# Patient Record
Sex: Male | Born: 1935 | Race: White | Hispanic: No | Marital: Married | Smoking: Former smoker
Health system: Southern US, Community
[De-identification: ages and names within clinical notes are randomized; demographics above are authoritative.]

## PROBLEM LIST (undated history)

## (undated) DIAGNOSIS — N289 Disorder of kidney and ureter, unspecified: Secondary | ICD-10-CM

## (undated) DIAGNOSIS — I10 Essential (primary) hypertension: Secondary | ICD-10-CM

## (undated) DIAGNOSIS — K56609 Unspecified intestinal obstruction, unspecified as to partial versus complete obstruction: Secondary | ICD-10-CM

## (undated) DIAGNOSIS — I350 Nonrheumatic aortic (valve) stenosis: Secondary | ICD-10-CM

## (undated) DIAGNOSIS — Z9981 Dependence on supplemental oxygen: Secondary | ICD-10-CM

## (undated) DIAGNOSIS — J189 Pneumonia, unspecified organism: Secondary | ICD-10-CM

## (undated) DIAGNOSIS — J449 Chronic obstructive pulmonary disease, unspecified: Secondary | ICD-10-CM

## (undated) DIAGNOSIS — J948 Other specified pleural conditions: Secondary | ICD-10-CM

## (undated) DIAGNOSIS — I4891 Unspecified atrial fibrillation: Secondary | ICD-10-CM

## (undated) DIAGNOSIS — I509 Heart failure, unspecified: Secondary | ICD-10-CM

## (undated) DIAGNOSIS — M1990 Unspecified osteoarthritis, unspecified site: Secondary | ICD-10-CM

## (undated) HISTORY — PX: ROTATOR CUFF REPAIR: SHX139

## (undated) HISTORY — PX: CORONARY ANGIOPLASTY: SHX604

## (undated) HISTORY — DX: Unspecified intestinal obstruction, unspecified as to partial versus complete obstruction: K56.609

## (undated) HISTORY — DX: Pneumonia, unspecified organism: J18.9

## (undated) HISTORY — DX: Other specified pleural conditions: J94.8

## (undated) HISTORY — PX: CHOLECYSTECTOMY: SHX55

## (undated) HISTORY — DX: Unspecified atrial fibrillation: I48.91

## (undated) HISTORY — DX: Disorder of kidney and ureter, unspecified: N28.9

## (undated) HISTORY — PX: GALLBLADDER SURGERY: SHX652

## (undated) HISTORY — DX: Essential (primary) hypertension: I10

## (undated) HISTORY — DX: Nonrheumatic aortic (valve) stenosis: I35.0

## (undated) HISTORY — DX: Unspecified osteoarthritis, unspecified site: M19.90

## (undated) HISTORY — PX: OTHER SURGICAL HISTORY: SHX169

## (undated) HISTORY — PX: CARDIAC VALVE REPLACEMENT: SHX585

## (undated) HISTORY — DX: Chronic obstructive pulmonary disease, unspecified: J44.9

---

## 2005-01-08 ENCOUNTER — Ambulatory Visit (HOSPITAL_COMMUNITY): Admission: RE | Admit: 2005-01-08 | Discharge: 2005-01-08 | Payer: Self-pay | Admitting: Pulmonary Disease

## 2005-01-18 ENCOUNTER — Other Ambulatory Visit: Admission: RE | Admit: 2005-01-18 | Discharge: 2005-01-18 | Payer: Self-pay | Admitting: Dermatology

## 2005-07-03 ENCOUNTER — Inpatient Hospital Stay (HOSPITAL_COMMUNITY): Admission: EM | Admit: 2005-07-03 | Discharge: 2005-07-06 | Payer: Self-pay | Admitting: Emergency Medicine

## 2005-07-26 ENCOUNTER — Ambulatory Visit (HOSPITAL_COMMUNITY): Admission: RE | Admit: 2005-07-26 | Discharge: 2005-07-26 | Payer: Self-pay | Admitting: Pulmonary Disease

## 2006-10-10 ENCOUNTER — Ambulatory Visit (HOSPITAL_COMMUNITY): Admission: RE | Admit: 2006-10-10 | Discharge: 2006-10-10 | Payer: Self-pay | Admitting: Pulmonary Disease

## 2008-05-17 ENCOUNTER — Ambulatory Visit (HOSPITAL_COMMUNITY): Admission: RE | Admit: 2008-05-17 | Discharge: 2008-05-17 | Payer: Self-pay | Admitting: Pulmonary Disease

## 2008-05-26 ENCOUNTER — Ambulatory Visit (HOSPITAL_COMMUNITY): Admission: RE | Admit: 2008-05-26 | Discharge: 2008-05-26 | Payer: Self-pay | Admitting: Pulmonary Disease

## 2008-06-02 ENCOUNTER — Ambulatory Visit: Payer: Self-pay | Admitting: Cardiology

## 2008-06-02 ENCOUNTER — Encounter (INDEPENDENT_AMBULATORY_CARE_PROVIDER_SITE_OTHER): Payer: Self-pay | Admitting: Pulmonary Disease

## 2008-06-02 ENCOUNTER — Ambulatory Visit (HOSPITAL_COMMUNITY): Admission: RE | Admit: 2008-06-02 | Discharge: 2008-06-02 | Payer: Self-pay | Admitting: Pulmonary Disease

## 2008-07-12 ENCOUNTER — Ambulatory Visit (HOSPITAL_COMMUNITY): Admission: RE | Admit: 2008-07-12 | Discharge: 2008-07-12 | Payer: Self-pay | Admitting: Pulmonary Disease

## 2008-07-15 ENCOUNTER — Ambulatory Visit (HOSPITAL_COMMUNITY): Admission: RE | Admit: 2008-07-15 | Discharge: 2008-07-15 | Payer: Self-pay | Admitting: Pulmonary Disease

## 2008-08-30 ENCOUNTER — Emergency Department (HOSPITAL_COMMUNITY): Admission: EM | Admit: 2008-08-30 | Discharge: 2008-08-31 | Payer: Self-pay | Admitting: Emergency Medicine

## 2008-09-17 ENCOUNTER — Ambulatory Visit (HOSPITAL_COMMUNITY): Admission: RE | Admit: 2008-09-17 | Discharge: 2008-09-17 | Payer: Self-pay | Admitting: Pulmonary Disease

## 2008-09-24 ENCOUNTER — Ambulatory Visit (HOSPITAL_COMMUNITY): Admission: RE | Admit: 2008-09-24 | Discharge: 2008-09-24 | Payer: Self-pay | Admitting: Pulmonary Disease

## 2009-01-24 ENCOUNTER — Ambulatory Visit (HOSPITAL_COMMUNITY): Admission: RE | Admit: 2009-01-24 | Discharge: 2009-01-24 | Payer: Self-pay | Admitting: Orthopedic Surgery

## 2009-01-26 HISTORY — PX: CARDIOVASCULAR STRESS TEST: SHX262

## 2009-02-02 ENCOUNTER — Ambulatory Visit (HOSPITAL_COMMUNITY): Admission: RE | Admit: 2009-02-02 | Discharge: 2009-02-03 | Payer: Self-pay | Admitting: Orthopedic Surgery

## 2009-02-27 ENCOUNTER — Inpatient Hospital Stay (HOSPITAL_COMMUNITY): Admission: EM | Admit: 2009-02-27 | Discharge: 2009-03-07 | Payer: Self-pay | Admitting: Emergency Medicine

## 2009-03-21 ENCOUNTER — Ambulatory Visit (HOSPITAL_COMMUNITY): Admission: RE | Admit: 2009-03-21 | Discharge: 2009-03-21 | Payer: Self-pay | Admitting: Pulmonary Disease

## 2009-03-23 ENCOUNTER — Encounter (HOSPITAL_COMMUNITY): Admission: RE | Admit: 2009-03-23 | Discharge: 2009-04-22 | Payer: Self-pay | Admitting: *Deleted

## 2009-04-25 ENCOUNTER — Ambulatory Visit (HOSPITAL_COMMUNITY): Admission: RE | Admit: 2009-04-25 | Discharge: 2009-04-25 | Payer: Self-pay | Admitting: Pulmonary Disease

## 2009-04-26 ENCOUNTER — Encounter (HOSPITAL_COMMUNITY): Admission: RE | Admit: 2009-04-26 | Discharge: 2009-05-26 | Payer: Self-pay | Admitting: Orthopedic Surgery

## 2009-06-17 ENCOUNTER — Ambulatory Visit (HOSPITAL_COMMUNITY): Admission: RE | Admit: 2009-06-17 | Discharge: 2009-06-17 | Payer: Self-pay | Admitting: Pulmonary Disease

## 2009-07-13 ENCOUNTER — Ambulatory Visit (HOSPITAL_COMMUNITY): Admission: RE | Admit: 2009-07-13 | Discharge: 2009-07-14 | Payer: Self-pay | Admitting: Orthopedic Surgery

## 2009-09-06 ENCOUNTER — Encounter (HOSPITAL_COMMUNITY): Admission: RE | Admit: 2009-09-06 | Discharge: 2009-09-29 | Payer: Self-pay | Admitting: Orthopedic Surgery

## 2009-10-04 ENCOUNTER — Encounter (HOSPITAL_COMMUNITY): Admission: RE | Admit: 2009-10-04 | Discharge: 2009-11-03 | Payer: Self-pay | Admitting: Orthopedic Surgery

## 2009-11-04 ENCOUNTER — Encounter (HOSPITAL_COMMUNITY): Admission: RE | Admit: 2009-11-04 | Discharge: 2009-12-04 | Payer: Self-pay | Admitting: Orthopedic Surgery

## 2009-12-26 ENCOUNTER — Ambulatory Visit (HOSPITAL_COMMUNITY): Admission: RE | Admit: 2009-12-26 | Discharge: 2009-12-26 | Payer: Self-pay | Admitting: Pulmonary Disease

## 2010-05-17 ENCOUNTER — Ambulatory Visit: Payer: Self-pay | Admitting: Cardiology

## 2010-05-17 DIAGNOSIS — I359 Nonrheumatic aortic valve disorder, unspecified: Secondary | ICD-10-CM | POA: Insufficient documentation

## 2010-05-17 DIAGNOSIS — I4892 Unspecified atrial flutter: Secondary | ICD-10-CM | POA: Insufficient documentation

## 2010-05-17 DIAGNOSIS — R0989 Other specified symptoms and signs involving the circulatory and respiratory systems: Secondary | ICD-10-CM

## 2010-05-18 ENCOUNTER — Encounter: Payer: Self-pay | Admitting: Cardiology

## 2010-05-22 ENCOUNTER — Encounter: Payer: Self-pay | Admitting: Cardiology

## 2010-05-22 ENCOUNTER — Ambulatory Visit (HOSPITAL_COMMUNITY): Admission: RE | Admit: 2010-05-22 | Discharge: 2010-05-22 | Payer: Self-pay | Admitting: Cardiology

## 2010-05-23 ENCOUNTER — Encounter: Payer: Self-pay | Admitting: Cardiology

## 2010-06-27 ENCOUNTER — Ambulatory Visit (HOSPITAL_COMMUNITY): Admission: RE | Admit: 2010-06-27 | Discharge: 2010-06-27 | Payer: Self-pay | Admitting: Pulmonary Disease

## 2010-08-17 ENCOUNTER — Ambulatory Visit: Payer: Self-pay | Admitting: Cardiology

## 2010-08-21 ENCOUNTER — Ambulatory Visit: Payer: Self-pay | Admitting: Cardiology

## 2010-08-22 ENCOUNTER — Ambulatory Visit: Payer: Self-pay | Admitting: Cardiology

## 2010-08-22 ENCOUNTER — Inpatient Hospital Stay (HOSPITAL_BASED_OUTPATIENT_CLINIC_OR_DEPARTMENT_OTHER): Admission: RE | Admit: 2010-08-22 | Discharge: 2010-08-22 | Payer: Self-pay | Admitting: Cardiology

## 2010-08-24 ENCOUNTER — Ambulatory Visit: Payer: Self-pay | Admitting: Cardiothoracic Surgery

## 2010-08-25 ENCOUNTER — Encounter: Payer: Self-pay | Admitting: Cardiothoracic Surgery

## 2010-08-25 ENCOUNTER — Telehealth (INDEPENDENT_AMBULATORY_CARE_PROVIDER_SITE_OTHER): Payer: Self-pay | Admitting: *Deleted

## 2010-08-25 ENCOUNTER — Encounter: Payer: Self-pay | Admitting: Pulmonary Disease

## 2010-08-28 ENCOUNTER — Ambulatory Visit: Payer: Self-pay | Admitting: Pulmonary Disease

## 2010-08-29 ENCOUNTER — Ambulatory Visit: Payer: Self-pay | Admitting: Cardiothoracic Surgery

## 2010-08-29 ENCOUNTER — Inpatient Hospital Stay (HOSPITAL_COMMUNITY): Admission: RE | Admit: 2010-08-29 | Discharge: 2010-09-20 | Payer: Self-pay | Admitting: Cardiothoracic Surgery

## 2010-08-29 ENCOUNTER — Ambulatory Visit: Payer: Self-pay | Admitting: Cardiology

## 2010-08-29 ENCOUNTER — Encounter: Payer: Self-pay | Admitting: Cardiothoracic Surgery

## 2010-08-29 HISTORY — PX: STERNAL INCISION RECLOSURE: SHX2442

## 2010-09-01 ENCOUNTER — Encounter: Payer: Self-pay | Admitting: Pulmonary Disease

## 2010-09-20 ENCOUNTER — Inpatient Hospital Stay: Admission: AD | Admit: 2010-09-20 | Discharge: 2010-09-22 | Payer: Self-pay | Admitting: Pulmonary Disease

## 2010-09-22 ENCOUNTER — Inpatient Hospital Stay (HOSPITAL_COMMUNITY): Admission: EM | Admit: 2010-09-22 | Discharge: 2010-09-28 | Payer: Self-pay | Admitting: Emergency Medicine

## 2010-09-28 ENCOUNTER — Inpatient Hospital Stay: Admission: AD | Admit: 2010-09-28 | Discharge: 2010-10-06 | Payer: Self-pay | Admitting: Pulmonary Disease

## 2010-10-20 ENCOUNTER — Ambulatory Visit: Payer: Self-pay | Admitting: Cardiology

## 2010-10-30 ENCOUNTER — Encounter (HOSPITAL_COMMUNITY)
Admission: RE | Admit: 2010-10-30 | Discharge: 2010-11-29 | Payer: Self-pay | Source: Home / Self Care | Admitting: Cardiology

## 2010-11-02 ENCOUNTER — Encounter: Admission: RE | Admit: 2010-11-02 | Discharge: 2010-11-02 | Payer: Self-pay | Admitting: Cardiothoracic Surgery

## 2010-11-02 ENCOUNTER — Ambulatory Visit: Payer: Self-pay | Admitting: Cardiothoracic Surgery

## 2010-11-17 ENCOUNTER — Ambulatory Visit (HOSPITAL_COMMUNITY): Admission: RE | Admit: 2010-11-17 | Discharge: 2010-11-17 | Payer: Self-pay | Admitting: Pulmonary Disease

## 2010-11-29 ENCOUNTER — Encounter (HOSPITAL_COMMUNITY)
Admission: RE | Admit: 2010-11-29 | Discharge: 2010-12-29 | Payer: Self-pay | Source: Home / Self Care | Attending: Cardiology | Admitting: Cardiology

## 2010-12-26 ENCOUNTER — Ambulatory Visit: Payer: Self-pay | Admitting: Cardiology

## 2010-12-29 ENCOUNTER — Encounter (HOSPITAL_COMMUNITY)
Admission: RE | Admit: 2010-12-29 | Discharge: 2010-12-30 | Payer: Self-pay | Source: Home / Self Care | Attending: Cardiology | Admitting: Cardiology

## 2010-12-31 DIAGNOSIS — J948 Other specified pleural conditions: Secondary | ICD-10-CM

## 2010-12-31 HISTORY — DX: Other specified pleural conditions: J94.8

## 2011-01-03 ENCOUNTER — Ambulatory Visit (HOSPITAL_COMMUNITY)
Admission: RE | Admit: 2011-01-03 | Discharge: 2011-01-03 | Payer: Self-pay | Source: Home / Self Care | Attending: Cardiology | Admitting: Cardiology

## 2011-01-03 ENCOUNTER — Ambulatory Visit: Admission: RE | Admit: 2011-01-03 | Discharge: 2011-01-03 | Payer: Self-pay | Source: Home / Self Care

## 2011-01-03 ENCOUNTER — Other Ambulatory Visit: Payer: Self-pay | Admitting: Cardiology

## 2011-01-03 ENCOUNTER — Encounter: Payer: Self-pay | Admitting: Cardiology

## 2011-01-03 ENCOUNTER — Encounter (HOSPITAL_COMMUNITY)
Admission: RE | Admit: 2011-01-03 | Discharge: 2011-01-30 | Payer: Self-pay | Source: Home / Self Care | Attending: Cardiology | Admitting: Cardiology

## 2011-01-03 HISTORY — PX: US ECHOCARDIOGRAPHY: HXRAD669

## 2011-01-18 ENCOUNTER — Encounter: Payer: Self-pay | Admitting: Cardiology

## 2011-01-18 DIAGNOSIS — J449 Chronic obstructive pulmonary disease, unspecified: Secondary | ICD-10-CM | POA: Insufficient documentation

## 2011-01-18 DIAGNOSIS — M1990 Unspecified osteoarthritis, unspecified site: Secondary | ICD-10-CM | POA: Insufficient documentation

## 2011-01-18 DIAGNOSIS — I10 Essential (primary) hypertension: Secondary | ICD-10-CM | POA: Insufficient documentation

## 2011-01-18 DIAGNOSIS — M109 Gout, unspecified: Secondary | ICD-10-CM | POA: Insufficient documentation

## 2011-01-18 DIAGNOSIS — K56609 Unspecified intestinal obstruction, unspecified as to partial versus complete obstruction: Secondary | ICD-10-CM | POA: Insufficient documentation

## 2011-01-18 DIAGNOSIS — J189 Pneumonia, unspecified organism: Secondary | ICD-10-CM | POA: Insufficient documentation

## 2011-01-30 NOTE — Letter (Signed)
Summary: NUCLEAR REPORT 01-26-09  NUCLEAR REPORT 01-26-09   Imported By: Faythe Ghee 05/18/2010 09:44:27  _____________________________________________________________________  External Attachment:    Type:   Image     Comment:   External Document

## 2011-01-30 NOTE — Letter (Signed)
Summary: PROGRESS NOTE 03-14-10  PROGRESS NOTE 03-14-10   Imported By: Faythe Ghee 05/18/2010 09:43:55  _____________________________________________________________________  External Attachment:    Type:   Image     Comment:   External Document

## 2011-01-30 NOTE — Miscellaneous (Signed)
Summary: Pulmonary function test   Pulmonary Function Test Date: 08/25/2010 Height (in.): 66 Gender: Male  Pre-Spirometry FVC    Value: 2.44 L/min   Pred: 3.69 L/min     % Pred: 66 % FEV1    Value: 1.03 L     Pred: 2.67 L     % Pred: 38 % FEV1/FVC  Value: 42 %     Pred: 73 %     % Pred: 58 % FEF 25-75  Value: 0.32 L/min   Pred: 2.00 L/min     % Pred: 16 %  Lung Volumes DLCO    Value: 16.34 %   % Pred: 60 % DLCO/VA  Value: 3.96 %   % Pred: 91 %  Comments: Severe obstruction.  Moderated diffusion defect. Clinical Lists Changes  Observations: Added new observation of PFT COMMENTS: Severe obstruction.  Moderated diffusion defect. (08/25/2010 14:41) Added new observation of DLCO/VA%EXP: 91 % (08/25/2010 14:41) Added new observation of DLCO/VA: 3.96 % (08/25/2010 14:41) Added new observation of DLCO % EXPEC: 60 % (08/25/2010 14:41) Added new observation of DLCO: 16.34 % (08/25/2010 14:41) Added new observation of FEF % EXPEC: 16 % (08/25/2010 14:41) Added new observation of FEF25-75%PRE: 2.00 L/min (08/25/2010 14:41) Added new observation of FEF 25-75%: 0.32 L/min (08/25/2010 14:41) Added new observation of FEV1/FVC%EXP: 58 % (08/25/2010 14:41) Added new observation of FEV1/FVC PRE: 73 % (08/25/2010 14:41) Added new observation of FEV1/FVC: 42 % (08/25/2010 14:41) Added new observation of FEV1 % EXP: 38 % (08/25/2010 14:41) Added new observation of FEV1 PREDICT: 2.67 L (08/25/2010 14:41) Added new observation of FEV1: 1.03 L (08/25/2010 14:41) Added new observation of FVC % EXPECT: 66 % (08/25/2010 14:41) Added new observation of FVC PREDICT: 3.69 L (08/25/2010 14:41) Added new observation of FVC: 2.44 L (08/25/2010 14:41) Added new observation of PFT HEIGHT: 66  (08/25/2010 14:41) Added new observation of PFT DATE: 08/25/2010  (08/25/2010 14:41)

## 2011-01-30 NOTE — Letter (Signed)
Summary: ECHO 12-13-09  ECHO 12-13-09   Imported By: Faythe Ghee 05/18/2010 09:43:30  _____________________________________________________________________  External Attachment:    Type:   Image     Comment:   External Document

## 2011-01-30 NOTE — Progress Notes (Signed)
Summary: consultation  Phone Note From Other Clinic Call back at [phone]   Caller: dawn//Dr. Tyrone Sage Summary of Call: Need pt to be seen for pre-op clearance on 8/29, pls advise. Initial call taken by: Darletta Moll,  August 25, 2010 9:36 AM  Follow-up for Phone Call        Called,s poke with [facility].  per [facility], pt needs surgery clearance for aortic valve replacement and possible MAZE.  Dr. Tyrone Sage would like to do this surgery on Tuesday or Wednesday of this coming week.  Pt having PFT's today at [facility].    Consult scheduled with Dr. Craige Cotta for Monday, Aug 29 at 11:00am.  Dawn informed to tell pt to arrive 15 minutes early to fill out paperwork and bring all mediciations with him to visit.  She verbalized understanding and will inform pt of this.  Progress notes/tests are in [place] per [facility]. Follow-up by: Gweneth Dimitri RN,  August 25, 2010 10:13 AM

## 2011-01-30 NOTE — Assessment & Plan Note (Signed)
Summary: surgery clearance / cj   Visit Type:  Initial Consult Copy to:  Dr. Peter Swaziland, Dr. Tyrone Sage Primary Provider/Referring Provider:  Dr. Kari Baars  CC:  Pulmonary consult for surgical clearance. PFT's done @ [facility]. Patient to have heart valve surgery on 08/29/2010.Marland Kitchen  History of Present Illness: 75 yo male with GOLD 3 COPD, nocturnal hypoxemia, for pre-operative evaluation prior to having aortic valve replacement.  He was told he had COPD about 12 years ago.  He used to work at [employer], and was exposed to carbon dioxide and nitrogen.  He started smoking at age 62, smoked 1 pack per day, and quit 6 years ago.  He uses a nebulizer, and is using 2 liters oxygen at night.  He is followed by Dr. [name], but consultation has been requested to assist with post-operative pulmonary management.  He used to get bronchitis and pneumonia a lot.  His last episode of pneumonia was in 2010.  He was last on prednisone in July.  He gets occasional wheezing.  He has occasional cough with clear sputum.  He denies fever, hemoptysis, or sweats.  There is no history of allergies or asthma.  His sinuses are okay.  He denies hoarseness or sore throat.  He uses his nebulizer 4 times per day.  He does this because that is what he was told to do.  He also uses flovent two times a day.    There is no history of TB.  He denies animal or sick exposures.  He is from [place], and denies any recent travel history.  Spirometry from August 25, 2010 showed severe obstruction and moderate diffusion defect.  Labs from August 25, 2010:   FiO2                                     .21                                %  Patient Temperature                      98.6  pH, Blood Gas                            7.441             7.350-7.450  pCO2                                     40.8              35.0-45.0        mmHg  pO2, Blood Gas                           79.1       l      80.0-100.0       mmHg   WBC                                      7.8  4.0-10.5         K/uL  RBC                                      4.27              4.22-5.81        MIL/uL  Hemoglobin (HGB)                         12.4       l      13.0-17.0        g/dL  Hematocrit (HCT)                         38.7       l      39.0-52.0        %  MCV                                      90.6              78.0-100.0       fL  MCH -                                    29.0              26.0-34.0        pg  MCHC                                     32.0              30.0-36.0        g/dL  RDW                                      17.9       h      11.5-15.5        %  Platelet Count (PLT)                     155               150-400          K/uL  Sodium (NA)                              140               135-145          mEq/L  Potassium (K)                            4.4               3.5-5.1          mEq/L  Chloride  107               96-112           mEq/L  CO2                                      26                19-32            mEq/L  Glucose                                  143        h      70-99            mg/dL  BUN                                      12                6-23             mg/dL  Creatinine                               0.99              0.4-1.5          mg/dL  GFR, Est Non African American            >60               >60              mL/min  GFR, Est African American                >60               >60              mL/min    Oversized comment, see footnote  1  Bilirubin, Total                         1.7        h      0.3-1.2          mg/dL  Alkaline Phosphatase                     77                39-117           U/L  SGOT (AST)                               19                0-37             U/L  SGPT (ALT)                               13  0-53             U/L  Total  Protein                           6.9               6.0-8.3          g/dL   Albumin-Blood                            3.8               3.5-5.2          g/dL  Calcium                                  9.5               8.4-10.5         mg/dL  CXR  Procedure date:  08/25/2010  Findings:      CHEST - 2 VIEW    Comparison: 06/27/2010    Findings: The heart size and pulmonary vascularity are normal and   the lungs are clear.  No significant osseous abnormality.    IMPRESSION:   No acute disease in the chest.   Cardiac Cath  Procedure date:  08/22/2010  Findings:       PROCEDURES:   1. Right and left heart catheterization.   2. Coronary and left ventricular angiography.      EQUIPMENT:  4-French 4-cm left Judkins catheter, 4-French 3-DRC   catheter, 4-French pigtail catheter, 4-French left Amplatz 1 catheter, 4-   French arterial sheath, 5-French venous sheath, 5-French balloon-tip   Swan-Ganz catheter.      CONTRAST:  100 mL of Omnipaque.      MEDICATIONS:  Local anesthesia, 1% Xylocaine, Versed 1 mg IV.      HEMODYNAMIC DATA:  Thermodilution cardiac output was 2.7 liters per   minute with an index of 1.4.  By Hiram Comber, cardiac output was 3.3 liters per   minute with an index of 1.7.  Right atrial pressure was 13/13 with a   mean of 11 mmHg.  Right ventricular pressure was 41 with EDP of 9 mmHg.   Pulmonary artery pressure was 33/17 with a mean of 25 mmHg.  Pulmonary   capillary wedge pressure was 17/15 with a mean of 13 mmHg.  Left   ventricular pressure was 166 with EDP of 15 mmHg.  Aortic pressure was   119/76 with a mean of 94 mmHg.  The patient was in atrial fibrillation   during the procedure.  His mean aortic valve gradient was 19 mmHg with a   peak gradient of approximately 35-40 mmHg.  Aortic valve area was 0.83   sq cm.  There was no significant mitral valve gradient.      CORONARY ANGIOGRAPHY:  The left coronary artery arises and distributes   normally.  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   The left main was  normal.      The left anterior descending artery was normal.      The left circumflex coronary artery has minor irregularities less than   10%.      The right coronary artery arises and distributes normally.  It is a  normal vessel.      Left ventricular angiography was performed in the RAO view.  It   demonstrates normal left ventricular size and contractility with normal   systolic function.  Ejection fraction of 60%.  There are no wall motion   abnormalities.  The aortic valve is heavily calcified with restricted   motion.   Comments:       FINAL INTERPRETATION:   1. Severe aortic stenosis.   2. Normal coronary anatomy.   3. Normal left ventricular function.   4. Upper normal pulmonary pressures.      PLAN:  We would recommend aortic valve replacement.    Preventive Screening-Counseling & Management  Alcohol-Tobacco     Smoking Status: quit     Packs/Day: 1.0     Year Started: 1956     Year Quit: 2005  Current Medications (verified): 1)  Lisinopril 5 Mg Tabs (Lisinopril) .... On Hold 2)  Aspir-Trin 325 Mg Tbec (Aspirin) .... On Hold 3)  Diltiazem Hcl 90 Mg Tabs (Diltiazem Hcl) .... Take 1 Tab Three Times A Day 4)  Klor-Con 20 Meq Pack (Potassium Chloride) .... Take 1 Tab Two Times A Day ' 5)  Allopurinol 300 Mg Tabs (Allopurinol) .... Take 1 Tab Daily 6)  Glimepiride 2 Mg Tabs (Glimepiride) .... Take 1 Tab Daily 7)  Furosemide 40 Mg Tabs (Furosemide) .... Take 1 Tab Daily 8)  Warfarin Sodium 5 Mg Tabs (Warfarin Sodium) .... On Hold 9)  Ipratropium-Albuterol 0.5-2.5 (3) Mg/61ml Soln (Ipratropium-Albuterol) .... Use 1 Treatment Qid 10)  Celebrex 200 Mg Caps (Celecoxib) .... On Hold 11)  Flovent Hfa 110 Mcg/act Aero (Fluticasone Propionate  Hfa) .... Take 2 Puffs Two Times A Day  Allergies (verified): No Known Drug Allergies  Past History:  Past Medical History: GOLD 3 COPD      - Spirometry 08/25/10 FEV1 1.03(38%), FEV1% 42, DLCO 60% Nocturnal hypoxemia      -  uses 2 liters with sleep Gout Hypertension Pneumonia 2006, 2010 Severe aortic stenosis 12/10 - followed by Dr. Swaziland, [facility] Paroxysmal atrial fibrillation Diabetes Type 2 DJD  Past Surgical History: Cholecystectomy 1993 Left shoulder surgery Right shoulder surgery  Family History: Father: DM2 Mother: stroke Siblings: sister with renal failure Heart disease---sister Seasonal allergies---sister  Social History: Retired -  Lobbyist Married  Tobacco Use - Quit smoking  Alcohol Use - no Smoking Status:  quit Packs/Day:  1.0  Review of Systems       The patient complains of shortness of breath with activity, shortness of breath at rest, irregular heartbeats, and hand/feet swelling.  The patient denies productive cough, non-productive cough, coughing up blood, chest pain, acid heartburn, indigestion, loss of appetite, weight change, abdominal pain, difficulty swallowing, sore throat, tooth/dental problems, headaches, nasal congestion/difficulty breathing through nose, sneezing, itching, ear ache, anxiety, depression, joint stiffness or pain, rash, change in color of mucus, and fever.    Vital Signs:  Patient profile:   75 year old male Height:      66 inches (167.64 cm) Weight:      182.50 pounds (82.95 kg) BMI:     29.56 O2 Sat:      96 % on Room air Temp:     98.1 degrees F (36.72 degrees C) oral Pulse rate:   76 / minute BP sitting:   120 / 80  (right arm) Cuff size:   regular  Vitals Entered By: Michel Bickers CMA (August 28, 2010 10:55 AM)  O2 Sat  at Rest %:  96 O2 Flow:  Room air CC: Pulmonary consult for surgical clearance. PFT's done @ [facility]. Patient to have heart valve surgery on 08/29/2010. Is Patient Diabetic? Yes Comments Medications reviewed with the patient. Daytime phone verified. Michel Bickers [facility]  August 28, 2010 10:56 AM   Physical Exam  General:  normal appearance and obese.   Eyes:  PERRLA and EOMI.   Ears:  TMs intact and  clear with normal canals Nose:  no deformity, discharge, inflammation, or lesions Mouth:  no deformity or lesions, wears dentures Neck:  no masses, thyromegaly, or abnormal cervical nodes Chest Wall:  no deformities noted Lungs:  prolonged exhalation, no dullness to percussion, decreased breath sounds, no wheezing or rales Heart:  distant heart sounds, s1,s2 with faint systolic murmur Abdomen:  bowel sounds positive; abdomen soft and non-tender without masses, or organomegaly Msk:  no deformity or scoliosis noted with normal posture Pulses:  pulses normal Extremities:  no clubbing, cyanosis, edema, or deformity noted Neurologic:  normal CN II-XII and strength normal.   Cervical Nodes:  no significant adenopathy Axillary Nodes:  no significant adenopathy Psych:  alert and cooperative; normal mood and affect; normal attention span and concentration   Impression & Recommendations:  Problem # 1:  COPD (ICD-496) He is compensated on his current inhaler, nebulizer, and oxygen regimen.  Problem # 2:  AORTIC VALVE DISORDERS (ICD-424.1)  He has severe aortic stenosis and is scheduled to undergo repair of this.  Problem # 3:  PRE-OPERATIVE RESPIRATORY EXAMINATION (ICD-V72.82)  He has severe COPD and nocturnal hypoxemia.  He has stopped smoking.  He was on prednisone in July 2011.  His COPD is adequately compensated for at present.  He is schedule to undergo surgery to correct his aortic stenosis.  Given the severity of his AS there are very little options except surgery.  I explained to him that given the severity of his COPD he is at increased risk of peri-operative pulmonary complications.  These could include pneumonia and prolonged need for ventilatory support.  He should continue on his current inhaler and nebulizer regimen.  He will need close monitoring of his oxygenation and ventilatory status post-operatively.  I do not think that he would require empiric "stress dose steroids."   However, if he has trouble maintaining his blood pressure in the post-operative period, then he may need to be started on solu-cortef.  The pulmonary/critical care service will follow him during his hospital stay.  Medications Added to Medication List This Visit: 1)  Lisinopril 5 Mg Tabs (Lisinopril) .... On hold 2)  Aspir-trin 325 Mg Tbec (Aspirin) .... On hold 3)  Warfarin Sodium 5 Mg Tabs (Warfarin sodium) .... On hold 4)  Celebrex 200 Mg Caps (Celecoxib) .... On hold  Complete Medication List: 1)  Lisinopril 5 Mg Tabs (Lisinopril) .... On hold 2)  Aspir-trin 325 Mg Tbec (Aspirin) .... On hold 3)  Diltiazem Hcl 90 Mg Tabs (Diltiazem hcl) .... Take 1 tab three times a day 4)  Klor-con 20 Meq Pack (Potassium chloride) .... Take 1 tab two times a day ' 5)  Allopurinol 300 Mg Tabs (Allopurinol) .... Take 1 tab daily 6)  Glimepiride 2 Mg Tabs (Glimepiride) .... Take 1 tab daily 7)  Furosemide 40 Mg Tabs (Furosemide) .... Take 1 tab daily 8)  Warfarin Sodium 5 Mg Tabs (Warfarin sodium) .... On hold 9)  Ipratropium-albuterol 0.5-2.5 (3) Mg/50ml Soln (Ipratropium-albuterol) .... Use 1 treatment qid 10)  Celebrex 200 Mg Caps (Celecoxib) .Marland KitchenMarland KitchenMarland Kitchen  On hold 11)  Flovent Hfa 110 Mcg/act Aero (Fluticasone propionate  hfa) .... Take 2 puffs two times a day  Other Orders: Consultation Level IV (81191)  Patient Instructions: 1)  Good luck with your surgery 2)  Follow up with pulmonary as needed

## 2011-01-30 NOTE — Assessment & Plan Note (Signed)
Summary: **[name] [name] a fib   Visit Type:  Initial Consult Referring Provider:  Dr. Peter [name] Primary Provider:  Dr. Kari [name]   History of Present Illness: 75 year old male referred by Dr. Juanetta [name] for cardiology consultation and second opinion. He has a history of paroxysmal atrial fibrillation diagnosed within the last 2 years as well as aortic valve stenosis, that has progressed from the moderate to severe range. He has been followed by [facility], specifically Dr. Reyes [name], until more recently Dr. Swaziland. His last office visit was in March of this year, at which time the potential for aortic valve replacement was discussed.  Most recent echocardiogram from December 2010 describes an LVEF of 60-65% with mild LVH, and severe calcific aortic stenosis with a mean gradient of 33 mm mercury and a peak gradient of 62 mm mercury. Only trace mitral and tricuspid regurgitation are described. No associated aortic regurgitation was described. This was reported as being stable compared to a prior study from January 2010.  Mr. [name] [name] reports progressive shortness of breath over the last few years, now NYHA class III at worst. He does not experience any exertional chest pain or palpitations however. He reports compliance with medications. His wife, present today, indicates that his stamina has worsened over this same time course. He has had no frank syncope. While being initially resistant to considering surgery, he now indicates that he is more amenable, and wanted a second opinion prior to considering further testing. They both indicate that they have been happy with their recent care under the direction of Dr. Swaziland.  I spoke with Dr. Juanetta [name], who indicates that the patient's COPD is "moderate." He has not had any recent pulmonary function tests.  The patient has undergone prior ischemic evaluation via Myoview in January 2010 which demonstrated no evidence of ischemia.  Current Medications  (verified): 1)  Lisinopril 5 Mg Tabs (Lisinopril) .... Take 1 Tab Daily 2)  Aspir-Trin 325 Mg Tbec (Aspirin) .... Take 1 Tab Daily 3)  Diltiazem Hcl 90 Mg Tabs (Diltiazem Hcl) .... Take 1 Tab Three Times A Day 4)  Klor-Con 20 Meq Pack (Potassium Chloride) .... Take 1 Tab Two Times A Day ' 5)  Allopurinol 300 Mg Tabs (Allopurinol) .... Take 1 Tab Daily 6)  Glimepiride 2 Mg Tabs (Glimepiride) .... Take 1 Tab Daily 7)  Furosemide 40 Mg Tabs (Furosemide) .... Take 1 Tab Daily 8)  Warfarin Sodium 5 Mg Tabs (Warfarin Sodium) .... Take As Directed 9)  Ipratropium-Albuterol 0.5-2.5 (3) Mg/29ml Soln (Ipratropium-Albuterol) .... Use 1 Treatment Qid 10)  Celebrex 200 Mg Caps (Celecoxib) .... Take 1 Tab Daily 11)  Flovent Hfa 110 Mcg/act Aero (Fluticasone Propionate  Hfa) .... Take 2 Puffs Two Times A Day  Allergies (verified): No Known Drug Allergies  Past History:  Past Surgical History: Last updated: 05/15/2010 Cholecystectomy Left shoulder surgery Right shoulder surgery  Family History: Last updated: 05/15/2010 Father: DM2 Mother: stroke Siblings: sister with renal failure  Social History: Last updated: 05/15/2010 Retired -  [employer] Married  Tobacco Use - Yes Alcohol Use - no  Past Medical History: C O P [facility] - "moderate" Gout Hypertension Pneumonia 3/10 Severe aortic stenosis 12/10 - followed by Dr. Swaziland, [facility] Paroxysmal atrial fibrillation Nonischemic Myoview 1/10 Diabetes Type 2  Clinical Review Panels:  Echocardiogram Echocardiogram  SUMMARY   -  Overall left ventricular systolic function was normal. There were         no left ventricular regional wall motion  abnormalities. Left         ventricular wall thickness was mildly increased.   -  Aortic valve thickness was moderately increased. The aortic valve         was moderately calcified. There was mildly to moderately         reduced aortic valve leaflet excursion. The mean transaortic          valve gradient was 20 mmHg. Estimated aortic valve area (by         VTI) was 0.97 cm^2.   -  There was mild fibrocalcific change of the aortic root.   -  There was mild mitral annular calcification.   -  Left atrial size was at the upper limits of normal. (06/02/2008)    Review of Systems       The patient complains of dyspnea on exertion.  The patient denies anorexia, fever, weight loss, chest pain, syncope, peripheral edema, prolonged cough, headaches, hemoptysis, abdominal pain, melena, hematochezia, and severe indigestion/heartburn.         Otherwise reviewed and negative except as outlined.  Vital Signs:  Patient profile:   75 year old male Height:      66 inches Weight:      188 pounds BMI:     30.45 Pulse rate:   71 / minute BP sitting:   141 / 79  (right arm)  Vitals Entered By: Dreama Saa, CNA (May 17, 2010 1:03 PM)  Physical Exam  Additional Exam:  Overweight male in no acute distress. HEENT: Conjunctiva and lids normal, oropharynx with moist mucosa, presentation. Neck: Supple, no elevated JVP, left carotid bruit, no thyromegaly. Lungs: Diminished breath sounds, nonlabored, no wheezing. Cardiac: Regular rate and rhythm with 2-3/6 systolic murmur at the base, audible second heart sound, no diastolic murmur, no pericardial rub or S3 gallop. Abdomen: Protuberant, nontender, bowel sounds present, small umbilical hernia. Extremities: Mild stasis, trace ankle edema, distal pulses one plus. Skin: Warm and dry. Musculoskeletal: No kyphosis. Neuropsychiatric: Alert and oriented x3, somewhat hard of hearing, affect appropriate.   EKG  Procedure date:  05/17/2010  Findings:      Normal sinus rhythm at 68 beats per minute, poor R-wave progression.  Impression & Recommendations:  Problem # 1:  AORTIC VALVE DISORDERS (ICD-424.1)  Moderate to severe calcific aortic stenosis based on echocardiogram done through [facility] in December 2010. Peak and mean  gradients are reviewed above. Cardiac murmur on examination is not overly impressive, however consistent with aortic stenosis. There is likely radiation to the carotids, however cannot exclude a superimposed left carotid bruit. From a symptom perspective, Mr. Ferber is describing NYHA class III dyspnea on exertion, potentially referable to his valve disease, however also complicated by a concurrent history of "moderate" COPD. He definitely indicates worsening symptoms of the last year and his wife agrees with this. I discussed the situation with the patient and his wife, and it and would agree with Dr. Elvis Coil plan for early followup and potentially early surgical intervention, particularly in light of his progressive symptoms. My suggestion is that they followup with Dr. Swaziland in June with a repeat echocardiogram, which will be 6 months out from his most recent study, and they can then discuss the next step, likely to include left and right heart catheterization and surgical referral if valve disease is clearly severe. Patient also needs followup pulmonary function tests, and I took the liberty of ordering these after discussion with Dr. Juanetta [name], as objective assessment of pulmonary  function will be necessary.  His updated medication list for this problem includes:    Lisinopril 5 Mg Tabs (Lisinopril) .Marland Kitchen... Take 1 tab daily    Furosemide 40 Mg Tabs (Furosemide) .Marland Kitchen... Take 1 tab daily  Problem # 2:  COPD (ICD-496)  Likely also contributes to the patient's dyspnea on exertion. As noted above, formal pulmonary function tests will be ordered, read by Dr. Juanetta [name], for more objective assessment. This will be important particularly if the patient requires valve surgery.  His updated medication list for this problem includes:    Ipratropium-albuterol 0.5-2.5 (3) Mg/66ml Soln (Ipratropium-albuterol) ..... Use 1 treatment qid    Flovent Hfa 110 Mcg/act Aero (Fluticasone propionate  hfa) .Marland Kitchen... Take 2 puffs two  times a day  Orders: Pulmonary Function Test (PFT)  Problem # 3:  ATRIAL FIBRILLATION (ICD-427.31)  Patient is in normal sinus rhythm today, with history of paroxysmal atrial fibrillation. He continues on Coumadin with CHADS2 score of 2.  His updated medication list for this problem includes:    Aspir-trin 325 Mg Tbec (Aspirin) .Marland Kitchen... Take 1 tab daily    Warfarin Sodium 5 Mg Tabs (Warfarin sodium) .Marland Kitchen... Take as directed  Problem # 4:  CAROTID BRUIT (ICD-785.9)  Likely radiation of cardiac murmur to the carotids, however cannot exclude superimposed left carotid bruit. Patient will certainly need carotid Dopplers as part of a preoperative assessment. Will defer to Dr. Swaziland.  Patient Instructions: 1)  Your physician recommends that you schedule a follow-up appointment in: as needed  2)  Your physician recommends that you continue on your current medications as directed. Please refer to the Current Medication list given to you today. 3)  Your physician has recommended that you have a pulmonary function test.  Pulmonary Function Tests are a group of tests that measure how well air moves in and out of your lungs.

## 2011-01-30 NOTE — Letter (Signed)
Summary: EKG 03/21/09   EKG 03/21/09   Imported By: Faythe Ghee 05/18/2010 09:44:57  _____________________________________________________________________  External Attachment:    Type:   Image     Comment:   External Document

## 2011-01-31 ENCOUNTER — Ambulatory Visit (HOSPITAL_COMMUNITY): Payer: PRIVATE HEALTH INSURANCE | Attending: Cardiology

## 2011-01-31 DIAGNOSIS — Z5189 Encounter for other specified aftercare: Secondary | ICD-10-CM | POA: Insufficient documentation

## 2011-01-31 DIAGNOSIS — Z954 Presence of other heart-valve replacement: Secondary | ICD-10-CM | POA: Insufficient documentation

## 2011-02-02 ENCOUNTER — Ambulatory Visit (HOSPITAL_COMMUNITY): Payer: PRIVATE HEALTH INSURANCE | Attending: Cardiology

## 2011-02-02 DIAGNOSIS — Z954 Presence of other heart-valve replacement: Secondary | ICD-10-CM | POA: Insufficient documentation

## 2011-02-02 DIAGNOSIS — Z5189 Encounter for other specified aftercare: Secondary | ICD-10-CM | POA: Insufficient documentation

## 2011-02-05 ENCOUNTER — Ambulatory Visit (HOSPITAL_COMMUNITY): Payer: PRIVATE HEALTH INSURANCE | Attending: Cardiology

## 2011-02-05 DIAGNOSIS — Z954 Presence of other heart-valve replacement: Secondary | ICD-10-CM | POA: Insufficient documentation

## 2011-02-05 DIAGNOSIS — Z5189 Encounter for other specified aftercare: Secondary | ICD-10-CM | POA: Insufficient documentation

## 2011-02-07 ENCOUNTER — Ambulatory Visit (HOSPITAL_COMMUNITY): Payer: PRIVATE HEALTH INSURANCE | Attending: Cardiology

## 2011-02-07 DIAGNOSIS — Z954 Presence of other heart-valve replacement: Secondary | ICD-10-CM | POA: Insufficient documentation

## 2011-02-07 DIAGNOSIS — Z5189 Encounter for other specified aftercare: Secondary | ICD-10-CM | POA: Insufficient documentation

## 2011-02-09 ENCOUNTER — Ambulatory Visit (HOSPITAL_COMMUNITY): Payer: PRIVATE HEALTH INSURANCE | Attending: Cardiology

## 2011-02-09 DIAGNOSIS — Z954 Presence of other heart-valve replacement: Secondary | ICD-10-CM | POA: Insufficient documentation

## 2011-02-09 DIAGNOSIS — Z5189 Encounter for other specified aftercare: Secondary | ICD-10-CM | POA: Insufficient documentation

## 2011-02-12 ENCOUNTER — Ambulatory Visit (HOSPITAL_COMMUNITY): Payer: PRIVATE HEALTH INSURANCE | Attending: Cardiology

## 2011-02-12 DIAGNOSIS — Z5189 Encounter for other specified aftercare: Secondary | ICD-10-CM | POA: Insufficient documentation

## 2011-02-12 DIAGNOSIS — Z954 Presence of other heart-valve replacement: Secondary | ICD-10-CM | POA: Insufficient documentation

## 2011-02-22 ENCOUNTER — Encounter (INDEPENDENT_AMBULATORY_CARE_PROVIDER_SITE_OTHER): Payer: PRIVATE HEALTH INSURANCE | Admitting: Cardiothoracic Surgery

## 2011-02-22 DIAGNOSIS — I359 Nonrheumatic aortic valve disorder, unspecified: Secondary | ICD-10-CM

## 2011-02-22 NOTE — Assessment & Plan Note (Signed)
OFFICE VISIT  EWART, CARRERA DOB:  17-Mar-1936                                        February 22, 2011 CHART #:  [id number]  The patient returns to the office today in followup after his aortic valve replacement with a pericardial tissue valve, ligation of left atrial appendage, left-sided maze procedure done on August 29, 2010.  At the patient's baseline he had significant underlying pulmonary disease. Postoperatively, he had some respiratory failure, developed atrial flutter and while in the hospital dehisced his sternum secondary to his respiratory status, this was rewired and the patient has now returned to better status than he was preoperatively.  He notes that he is able to walk now, respiratory status and preop shortness of breath is definitely improved.  He denies any chest pain.  He does continue on amiodarone and Coumadin, but appears to be in sinus rhythm at this time.  On exam, his blood pressure is 161/83, pulse 70, respiratory rate 16, O2 sats 96%.  His sternum is stable and well healed.  He has no pedal edema.  His breath sounds are distant, but without any active wheezing.  He continues on potassium supplementation 20 mEq a day, aspirin 81 mg a day, diltiazem 240 mg a day, glyburide 25 mg a day, Lasix 40 mg a day, Coumadin 5 mg a day, lisinopril 10 mg a day, allopurinol 300 mg a day, amiodarone 200 mg a day, Flovent, and albuterol nebulizers.  Overall I am very pleased with the patient's progress.  He has markedly improved from what he was in August as far as functional status and he continues on amiodarone and Coumadin.  With his underlying pulmonary disease it may be beneficial to get him off amiodarone.  He was on the Coumadin because of atrial flutter and if not the tissue valve that he has in place, I have asked him to make a return appointment to see Dr. Swaziland.  He was last seen in their office in December to follow up on  long-term use of the Coumadin and amiodarone.  Sheliah Plane, MD Electronically Signed  EG/MEDQ  D:  02/22/2011  T:  02/22/2011  Job:  [id number]  cc:   Ramon Dredge L. [name], M.D. Peter M. Swaziland, M.D.

## 2011-03-14 LAB — GLUCOSE, CAPILLARY
Glucose-Capillary: 110 mg/dL — ABNORMAL HIGH (ref 70–99)
Glucose-Capillary: 135 mg/dL — ABNORMAL HIGH (ref 70–99)
Glucose-Capillary: 138 mg/dL — ABNORMAL HIGH (ref 70–99)
Glucose-Capillary: 172 mg/dL — ABNORMAL HIGH (ref 70–99)
Glucose-Capillary: 186 mg/dL — ABNORMAL HIGH (ref 70–99)
Glucose-Capillary: 220 mg/dL — ABNORMAL HIGH (ref 70–99)
Glucose-Capillary: 94 mg/dL (ref 70–99)

## 2011-03-15 LAB — COMPREHENSIVE METABOLIC PANEL
ALT: 21 U/L (ref 0–53)
AST: 24 U/L (ref 0–37)
Albumin: 2.3 g/dL — ABNORMAL LOW (ref 3.5–5.2)
Albumin: 2.7 g/dL — ABNORMAL LOW (ref 3.5–5.2)
Alkaline Phosphatase: 75 U/L (ref 39–117)
Alkaline Phosphatase: 90 U/L (ref 39–117)
BUN: 10 mg/dL (ref 6–23)
BUN: 20 mg/dL (ref 6–23)
Chloride: 92 mEq/L — ABNORMAL LOW (ref 96–112)
Creatinine, Ser: 1.15 mg/dL (ref 0.4–1.5)
Glucose, Bld: 87 mg/dL (ref 70–99)
Potassium: 4.4 mEq/L (ref 3.5–5.1)
Potassium: 6.3 mEq/L (ref 3.5–5.1)
Total Bilirubin: 1 mg/dL (ref 0.3–1.2)
Total Protein: 6.7 g/dL (ref 6.0–8.3)

## 2011-03-15 LAB — GLUCOSE, CAPILLARY
Glucose-Capillary: 101 mg/dL — ABNORMAL HIGH (ref 70–99)
Glucose-Capillary: 106 mg/dL — ABNORMAL HIGH (ref 70–99)
Glucose-Capillary: 111 mg/dL — ABNORMAL HIGH (ref 70–99)
Glucose-Capillary: 113 mg/dL — ABNORMAL HIGH (ref 70–99)
Glucose-Capillary: 115 mg/dL — ABNORMAL HIGH (ref 70–99)
Glucose-Capillary: 121 mg/dL — ABNORMAL HIGH (ref 70–99)
Glucose-Capillary: 132 mg/dL — ABNORMAL HIGH (ref 70–99)
Glucose-Capillary: 134 mg/dL — ABNORMAL HIGH (ref 70–99)
Glucose-Capillary: 139 mg/dL — ABNORMAL HIGH (ref 70–99)
Glucose-Capillary: 141 mg/dL — ABNORMAL HIGH (ref 70–99)
Glucose-Capillary: 143 mg/dL — ABNORMAL HIGH (ref 70–99)
Glucose-Capillary: 143 mg/dL — ABNORMAL HIGH (ref 70–99)
Glucose-Capillary: 149 mg/dL — ABNORMAL HIGH (ref 70–99)
Glucose-Capillary: 153 mg/dL — ABNORMAL HIGH (ref 70–99)
Glucose-Capillary: 159 mg/dL — ABNORMAL HIGH (ref 70–99)
Glucose-Capillary: 159 mg/dL — ABNORMAL HIGH (ref 70–99)
Glucose-Capillary: 159 mg/dL — ABNORMAL HIGH (ref 70–99)
Glucose-Capillary: 162 mg/dL — ABNORMAL HIGH (ref 70–99)
Glucose-Capillary: 216 mg/dL — ABNORMAL HIGH (ref 70–99)
Glucose-Capillary: 280 mg/dL — ABNORMAL HIGH (ref 70–99)
Glucose-Capillary: 77 mg/dL (ref 70–99)
Glucose-Capillary: 85 mg/dL (ref 70–99)
Glucose-Capillary: 95 mg/dL (ref 70–99)
Glucose-Capillary: 96 mg/dL (ref 70–99)
Glucose-Capillary: 96 mg/dL (ref 70–99)
Glucose-Capillary: 102 mg/dL — ABNORMAL HIGH (ref 70–99)
Glucose-Capillary: 107 mg/dL — ABNORMAL HIGH (ref 70–99)
Glucose-Capillary: 110 mg/dL — ABNORMAL HIGH (ref 70–99)
Glucose-Capillary: 122 mg/dL — ABNORMAL HIGH (ref 70–99)
Glucose-Capillary: 146 mg/dL — ABNORMAL HIGH (ref 70–99)
Glucose-Capillary: 146 mg/dL — ABNORMAL HIGH (ref 70–99)
Glucose-Capillary: 152 mg/dL — ABNORMAL HIGH (ref 70–99)
Glucose-Capillary: 155 mg/dL — ABNORMAL HIGH (ref 70–99)
Glucose-Capillary: 185 mg/dL — ABNORMAL HIGH (ref 70–99)
Glucose-Capillary: 39 mg/dL — CL (ref 70–99)
Glucose-Capillary: 40 mg/dL — CL (ref 70–99)
Glucose-Capillary: 46 mg/dL — ABNORMAL LOW (ref 70–99)
Glucose-Capillary: 51 mg/dL — ABNORMAL LOW (ref 70–99)
Glucose-Capillary: 52 mg/dL — ABNORMAL LOW (ref 70–99)
Glucose-Capillary: 52 mg/dL — ABNORMAL LOW (ref 70–99)
Glucose-Capillary: 59 mg/dL — ABNORMAL LOW (ref 70–99)
Glucose-Capillary: 65 mg/dL — ABNORMAL LOW (ref 70–99)
Glucose-Capillary: 67 mg/dL — ABNORMAL LOW (ref 70–99)
Glucose-Capillary: 72 mg/dL (ref 70–99)
Glucose-Capillary: 73 mg/dL (ref 70–99)
Glucose-Capillary: 75 mg/dL (ref 70–99)
Glucose-Capillary: 76 mg/dL (ref 70–99)
Glucose-Capillary: 79 mg/dL (ref 70–99)
Glucose-Capillary: 79 mg/dL (ref 70–99)
Glucose-Capillary: 84 mg/dL (ref 70–99)
Glucose-Capillary: 88 mg/dL (ref 70–99)
Glucose-Capillary: 90 mg/dL (ref 70–99)
Glucose-Capillary: 92 mg/dL (ref 70–99)
Glucose-Capillary: 92 mg/dL (ref 70–99)
Glucose-Capillary: 92 mg/dL (ref 70–99)
Glucose-Capillary: 95 mg/dL (ref 70–99)
Glucose-Capillary: 96 mg/dL (ref 70–99)
Glucose-Capillary: 97 mg/dL (ref 70–99)
Glucose-Capillary: 98 mg/dL (ref 70–99)
Glucose-Capillary: 100 mg/dL — ABNORMAL HIGH (ref 70–99)
Glucose-Capillary: 101 mg/dL — ABNORMAL HIGH (ref 70–99)
Glucose-Capillary: 102 mg/dL — ABNORMAL HIGH (ref 70–99)
Glucose-Capillary: 106 mg/dL — ABNORMAL HIGH (ref 70–99)
Glucose-Capillary: 110 mg/dL — ABNORMAL HIGH (ref 70–99)
Glucose-Capillary: 112 mg/dL — ABNORMAL HIGH (ref 70–99)
Glucose-Capillary: 120 mg/dL — ABNORMAL HIGH (ref 70–99)
Glucose-Capillary: 121 mg/dL — ABNORMAL HIGH (ref 70–99)
Glucose-Capillary: 124 mg/dL — ABNORMAL HIGH (ref 70–99)
Glucose-Capillary: 126 mg/dL — ABNORMAL HIGH (ref 70–99)
Glucose-Capillary: 127 mg/dL — ABNORMAL HIGH (ref 70–99)
Glucose-Capillary: 128 mg/dL — ABNORMAL HIGH (ref 70–99)
Glucose-Capillary: 131 mg/dL — ABNORMAL HIGH (ref 70–99)
Glucose-Capillary: 132 mg/dL — ABNORMAL HIGH (ref 70–99)
Glucose-Capillary: 133 mg/dL — ABNORMAL HIGH (ref 70–99)
Glucose-Capillary: 133 mg/dL — ABNORMAL HIGH (ref 70–99)
Glucose-Capillary: 136 mg/dL — ABNORMAL HIGH (ref 70–99)
Glucose-Capillary: 136 mg/dL — ABNORMAL HIGH (ref 70–99)
Glucose-Capillary: 136 mg/dL — ABNORMAL HIGH (ref 70–99)
Glucose-Capillary: 138 mg/dL — ABNORMAL HIGH (ref 70–99)
Glucose-Capillary: 143 mg/dL — ABNORMAL HIGH (ref 70–99)
Glucose-Capillary: 144 mg/dL — ABNORMAL HIGH (ref 70–99)
Glucose-Capillary: 144 mg/dL — ABNORMAL HIGH (ref 70–99)
Glucose-Capillary: 144 mg/dL — ABNORMAL HIGH (ref 70–99)
Glucose-Capillary: 145 mg/dL — ABNORMAL HIGH (ref 70–99)
Glucose-Capillary: 146 mg/dL — ABNORMAL HIGH (ref 70–99)
Glucose-Capillary: 150 mg/dL — ABNORMAL HIGH (ref 70–99)
Glucose-Capillary: 150 mg/dL — ABNORMAL HIGH (ref 70–99)
Glucose-Capillary: 150 mg/dL — ABNORMAL HIGH (ref 70–99)
Glucose-Capillary: 151 mg/dL — ABNORMAL HIGH (ref 70–99)
Glucose-Capillary: 152 mg/dL — ABNORMAL HIGH (ref 70–99)
Glucose-Capillary: 153 mg/dL — ABNORMAL HIGH (ref 70–99)
Glucose-Capillary: 153 mg/dL — ABNORMAL HIGH (ref 70–99)
Glucose-Capillary: 157 mg/dL — ABNORMAL HIGH (ref 70–99)
Glucose-Capillary: 157 mg/dL — ABNORMAL HIGH (ref 70–99)
Glucose-Capillary: 160 mg/dL — ABNORMAL HIGH (ref 70–99)
Glucose-Capillary: 160 mg/dL — ABNORMAL HIGH (ref 70–99)
Glucose-Capillary: 163 mg/dL — ABNORMAL HIGH (ref 70–99)
Glucose-Capillary: 163 mg/dL — ABNORMAL HIGH (ref 70–99)
Glucose-Capillary: 164 mg/dL — ABNORMAL HIGH (ref 70–99)
Glucose-Capillary: 166 mg/dL — ABNORMAL HIGH (ref 70–99)
Glucose-Capillary: 167 mg/dL — ABNORMAL HIGH (ref 70–99)
Glucose-Capillary: 168 mg/dL — ABNORMAL HIGH (ref 70–99)
Glucose-Capillary: 168 mg/dL — ABNORMAL HIGH (ref 70–99)
Glucose-Capillary: 169 mg/dL — ABNORMAL HIGH (ref 70–99)
Glucose-Capillary: 169 mg/dL — ABNORMAL HIGH (ref 70–99)
Glucose-Capillary: 169 mg/dL — ABNORMAL HIGH (ref 70–99)
Glucose-Capillary: 170 mg/dL — ABNORMAL HIGH (ref 70–99)
Glucose-Capillary: 171 mg/dL — ABNORMAL HIGH (ref 70–99)
Glucose-Capillary: 174 mg/dL — ABNORMAL HIGH (ref 70–99)
Glucose-Capillary: 177 mg/dL — ABNORMAL HIGH (ref 70–99)
Glucose-Capillary: 182 mg/dL — ABNORMAL HIGH (ref 70–99)
Glucose-Capillary: 185 mg/dL — ABNORMAL HIGH (ref 70–99)
Glucose-Capillary: 186 mg/dL — ABNORMAL HIGH (ref 70–99)
Glucose-Capillary: 188 mg/dL — ABNORMAL HIGH (ref 70–99)
Glucose-Capillary: 188 mg/dL — ABNORMAL HIGH (ref 70–99)
Glucose-Capillary: 189 mg/dL — ABNORMAL HIGH (ref 70–99)
Glucose-Capillary: 189 mg/dL — ABNORMAL HIGH (ref 70–99)
Glucose-Capillary: 193 mg/dL — ABNORMAL HIGH (ref 70–99)
Glucose-Capillary: 203 mg/dL — ABNORMAL HIGH (ref 70–99)
Glucose-Capillary: 220 mg/dL — ABNORMAL HIGH (ref 70–99)
Glucose-Capillary: 290 mg/dL — ABNORMAL HIGH (ref 70–99)
Glucose-Capillary: 79 mg/dL (ref 70–99)
Glucose-Capillary: 81 mg/dL (ref 70–99)
Glucose-Capillary: 82 mg/dL (ref 70–99)
Glucose-Capillary: 83 mg/dL (ref 70–99)
Glucose-Capillary: 90 mg/dL (ref 70–99)
Glucose-Capillary: 96 mg/dL (ref 70–99)
Glucose-Capillary: 97 mg/dL (ref 70–99)
Glucose-Capillary: 99 mg/dL (ref 70–99)

## 2011-03-15 LAB — POCT I-STAT, CHEM 8
BUN: 21 mg/dL (ref 6–23)
Calcium, Ion: 1.02 mmol/L — ABNORMAL LOW (ref 1.12–1.32)
Chloride: 95 mEq/L — ABNORMAL LOW (ref 96–112)
Creatinine, Ser: 1.3 mg/dL (ref 0.4–1.5)
Glucose, Bld: 160 mg/dL — ABNORMAL HIGH (ref 70–99)
HCT: 27 % — ABNORMAL LOW (ref 39.0–52.0)
Hemoglobin: 9.2 g/dL — ABNORMAL LOW (ref 13.0–17.0)
Potassium: 4.6 mEq/L (ref 3.5–5.1)
Sodium: 135 mEq/L (ref 135–145)
TCO2: 34 mmol/L (ref 0–100)

## 2011-03-15 LAB — BASIC METABOLIC PANEL
BUN: 5 mg/dL — ABNORMAL LOW (ref 6–23)
BUN: 10 mg/dL (ref 6–23)
BUN: 13 mg/dL (ref 6–23)
BUN: 14 mg/dL (ref 6–23)
BUN: 16 mg/dL (ref 6–23)
BUN: 13 mg/dL (ref 6–23)
BUN: 15 mg/dL (ref 6–23)
BUN: 15 mg/dL (ref 6–23)
BUN: 18 mg/dL (ref 6–23)
BUN: 18 mg/dL (ref 6–23)
BUN: 26 mg/dL — ABNORMAL HIGH (ref 6–23)
BUN: 27 mg/dL — ABNORMAL HIGH (ref 6–23)
BUN: 31 mg/dL — ABNORMAL HIGH (ref 6–23)
BUN: 31 mg/dL — ABNORMAL HIGH (ref 6–23)
BUN: 33 mg/dL — ABNORMAL HIGH (ref 6–23)
BUN: 35 mg/dL — ABNORMAL HIGH (ref 6–23)
BUN: 36 mg/dL — ABNORMAL HIGH (ref 6–23)
BUN: 37 mg/dL — ABNORMAL HIGH (ref 6–23)
CO2: 29 mEq/L (ref 19–32)
CO2: 31 mEq/L (ref 19–32)
CO2: 32 mEq/L (ref 19–32)
CO2: 34 mEq/L — ABNORMAL HIGH (ref 19–32)
CO2: 34 mEq/L — ABNORMAL HIGH (ref 19–32)
CO2: 27 mEq/L (ref 19–32)
CO2: 28 mEq/L (ref 19–32)
CO2: 28 mEq/L (ref 19–32)
CO2: 29 mEq/L (ref 19–32)
CO2: 30 mEq/L (ref 19–32)
CO2: 31 mEq/L (ref 19–32)
CO2: 32 mEq/L (ref 19–32)
CO2: 33 mEq/L — ABNORMAL HIGH (ref 19–32)
CO2: 33 mEq/L — ABNORMAL HIGH (ref 19–32)
CO2: 34 mEq/L — ABNORMAL HIGH (ref 19–32)
CO2: 34 mEq/L — ABNORMAL HIGH (ref 19–32)
CO2: 34 mEq/L — ABNORMAL HIGH (ref 19–32)
CO2: 35 mEq/L — ABNORMAL HIGH (ref 19–32)
CO2: 38 mEq/L — ABNORMAL HIGH (ref 19–32)
Calcium: 8.1 mg/dL — ABNORMAL LOW (ref 8.4–10.5)
Calcium: 8.1 mg/dL — ABNORMAL LOW (ref 8.4–10.5)
Calcium: 8.2 mg/dL — ABNORMAL LOW (ref 8.4–10.5)
Calcium: 8.4 mg/dL (ref 8.4–10.5)
Calcium: 8.4 mg/dL (ref 8.4–10.5)
Calcium: 7.6 mg/dL — ABNORMAL LOW (ref 8.4–10.5)
Calcium: 7.6 mg/dL — ABNORMAL LOW (ref 8.4–10.5)
Calcium: 8 mg/dL — ABNORMAL LOW (ref 8.4–10.5)
Calcium: 8 mg/dL — ABNORMAL LOW (ref 8.4–10.5)
Calcium: 8.1 mg/dL — ABNORMAL LOW (ref 8.4–10.5)
Calcium: 8.1 mg/dL — ABNORMAL LOW (ref 8.4–10.5)
Calcium: 8.2 mg/dL — ABNORMAL LOW (ref 8.4–10.5)
Calcium: 8.2 mg/dL — ABNORMAL LOW (ref 8.4–10.5)
Calcium: 8.2 mg/dL — ABNORMAL LOW (ref 8.4–10.5)
Calcium: 8.2 mg/dL — ABNORMAL LOW (ref 8.4–10.5)
Calcium: 8.2 mg/dL — ABNORMAL LOW (ref 8.4–10.5)
Calcium: 8.3 mg/dL — ABNORMAL LOW (ref 8.4–10.5)
Calcium: 8.4 mg/dL (ref 8.4–10.5)
Calcium: 8.5 mg/dL (ref 8.4–10.5)
Chloride: 95 mEq/L — ABNORMAL LOW (ref 96–112)
Chloride: 95 mEq/L — ABNORMAL LOW (ref 96–112)
Chloride: 97 mEq/L (ref 96–112)
Chloride: 99 mEq/L (ref 96–112)
Chloride: 100 mEq/L (ref 96–112)
Chloride: 100 mEq/L (ref 96–112)
Chloride: 93 mEq/L — ABNORMAL LOW (ref 96–112)
Chloride: 93 mEq/L — ABNORMAL LOW (ref 96–112)
Chloride: 94 mEq/L — ABNORMAL LOW (ref 96–112)
Chloride: 95 mEq/L — ABNORMAL LOW (ref 96–112)
Chloride: 95 mEq/L — ABNORMAL LOW (ref 96–112)
Chloride: 95 mEq/L — ABNORMAL LOW (ref 96–112)
Chloride: 95 mEq/L — ABNORMAL LOW (ref 96–112)
Chloride: 95 mEq/L — ABNORMAL LOW (ref 96–112)
Chloride: 96 mEq/L (ref 96–112)
Chloride: 96 mEq/L (ref 96–112)
Chloride: 96 mEq/L (ref 96–112)
Chloride: 98 mEq/L (ref 96–112)
Chloride: 99 mEq/L (ref 96–112)
Creatinine, Ser: 1.14 mg/dL (ref 0.4–1.5)
Creatinine, Ser: 1.48 mg/dL (ref 0.4–1.5)
Creatinine, Ser: 1.53 mg/dL — ABNORMAL HIGH (ref 0.4–1.5)
Creatinine, Ser: 1.65 mg/dL — ABNORMAL HIGH (ref 0.4–1.5)
Creatinine, Ser: 1.1 mg/dL (ref 0.4–1.5)
Creatinine, Ser: 1.11 mg/dL (ref 0.4–1.5)
Creatinine, Ser: 1.11 mg/dL (ref 0.4–1.5)
Creatinine, Ser: 1.12 mg/dL (ref 0.4–1.5)
Creatinine, Ser: 1.16 mg/dL (ref 0.4–1.5)
Creatinine, Ser: 1.19 mg/dL (ref 0.4–1.5)
Creatinine, Ser: 1.26 mg/dL (ref 0.4–1.5)
Creatinine, Ser: 1.28 mg/dL (ref 0.4–1.5)
Creatinine, Ser: 1.32 mg/dL (ref 0.4–1.5)
Creatinine, Ser: 1.34 mg/dL (ref 0.4–1.5)
Creatinine, Ser: 1.39 mg/dL (ref 0.4–1.5)
Creatinine, Ser: 1.54 mg/dL — ABNORMAL HIGH (ref 0.4–1.5)
Creatinine, Ser: 1.93 mg/dL — ABNORMAL HIGH (ref 0.4–1.5)
Creatinine, Ser: 2.02 mg/dL — ABNORMAL HIGH (ref 0.4–1.5)
GFR calc Af Amer: 50 mL/min — ABNORMAL LOW (ref >60)
GFR calc Af Amer: 54 mL/min — ABNORMAL LOW (ref >60)
GFR calc Af Amer: 57 mL/min — ABNORMAL LOW (ref >60)
GFR calc Af Amer: >60 mL/min (ref >60)
GFR calc Af Amer: 39 mL/min — ABNORMAL LOW (ref >60)
GFR calc Af Amer: 42 mL/min — ABNORMAL LOW (ref >60)
GFR calc Af Amer: 54 mL/min — ABNORMAL LOW (ref >60)
GFR calc Af Amer: >60 mL/min (ref >60)
GFR calc Af Amer: >60 mL/min (ref >60)
GFR calc Af Amer: >60 mL/min (ref >60)
GFR calc Af Amer: >60 mL/min (ref >60)
GFR calc Af Amer: >60 mL/min (ref >60)
GFR calc Af Amer: >60 mL/min (ref >60)
GFR calc Af Amer: >60 mL/min (ref >60)
GFR calc Af Amer: >60 mL/min (ref >60)
GFR calc Af Amer: >60 mL/min (ref >60)
GFR calc Af Amer: >60 mL/min (ref >60)
GFR calc Af Amer: >60 mL/min (ref >60)
GFR calc Af Amer: >60 mL/min (ref >60)
GFR calc non Af Amer: 41 mL/min — ABNORMAL LOW (ref >60)
GFR calc non Af Amer: 45 mL/min — ABNORMAL LOW (ref >60)
GFR calc non Af Amer: 47 mL/min — ABNORMAL LOW (ref >60)
GFR calc non Af Amer: >60 mL/min (ref >60)
GFR calc non Af Amer: 33 mL/min — ABNORMAL LOW (ref >60)
GFR calc non Af Amer: 34 mL/min — ABNORMAL LOW (ref >60)
GFR calc non Af Amer: 45 mL/min — ABNORMAL LOW (ref >60)
GFR calc non Af Amer: 50 mL/min — ABNORMAL LOW (ref >60)
GFR calc non Af Amer: 52 mL/min — ABNORMAL LOW (ref >60)
GFR calc non Af Amer: 53 mL/min — ABNORMAL LOW (ref >60)
GFR calc non Af Amer: 56 mL/min — ABNORMAL LOW (ref >60)
GFR calc non Af Amer: >60 mL/min (ref >60)
GFR calc non Af Amer: >60 mL/min (ref >60)
GFR calc non Af Amer: >60 mL/min (ref >60)
GFR calc non Af Amer: >60 mL/min (ref >60)
GFR calc non Af Amer: >60 mL/min (ref >60)
GFR calc non Af Amer: >60 mL/min (ref >60)
GFR calc non Af Amer: >60 mL/min (ref >60)
Glucose, Bld: 126 mg/dL — ABNORMAL HIGH (ref 70–99)
Glucose, Bld: 45 mg/dL — ABNORMAL LOW (ref 70–99)
Glucose, Bld: 58 mg/dL — ABNORMAL LOW (ref 70–99)
Glucose, Bld: 69 mg/dL — ABNORMAL LOW (ref 70–99)
Glucose, Bld: 84 mg/dL (ref 70–99)
Glucose, Bld: 125 mg/dL — ABNORMAL HIGH (ref 70–99)
Glucose, Bld: 125 mg/dL — ABNORMAL HIGH (ref 70–99)
Glucose, Bld: 133 mg/dL — ABNORMAL HIGH (ref 70–99)
Glucose, Bld: 141 mg/dL — ABNORMAL HIGH (ref 70–99)
Glucose, Bld: 141 mg/dL — ABNORMAL HIGH (ref 70–99)
Glucose, Bld: 145 mg/dL — ABNORMAL HIGH (ref 70–99)
Glucose, Bld: 158 mg/dL — ABNORMAL HIGH (ref 70–99)
Glucose, Bld: 162 mg/dL — ABNORMAL HIGH (ref 70–99)
Glucose, Bld: 177 mg/dL — ABNORMAL HIGH (ref 70–99)
Glucose, Bld: 70 mg/dL (ref 70–99)
Glucose, Bld: 88 mg/dL (ref 70–99)
Glucose, Bld: 97 mg/dL (ref 70–99)
Glucose, Bld: 97 mg/dL (ref 70–99)
Glucose, Bld: 97 mg/dL (ref 70–99)
Potassium: 4.1 mEq/L (ref 3.5–5.1)
Potassium: 4.4 mEq/L (ref 3.5–5.1)
Potassium: 4.4 mEq/L (ref 3.5–5.1)
Potassium: 5.1 mEq/L (ref 3.5–5.1)
Potassium: 5.3 mEq/L — ABNORMAL HIGH (ref 3.5–5.1)
Potassium: 3.8 mEq/L (ref 3.5–5.1)
Potassium: 3.8 mEq/L (ref 3.5–5.1)
Potassium: 3.9 mEq/L (ref 3.5–5.1)
Potassium: 3.9 mEq/L (ref 3.5–5.1)
Potassium: 4 mEq/L (ref 3.5–5.1)
Potassium: 4.1 mEq/L (ref 3.5–5.1)
Potassium: 4.3 mEq/L (ref 3.5–5.1)
Potassium: 4.3 mEq/L (ref 3.5–5.1)
Potassium: 4.4 mEq/L (ref 3.5–5.1)
Potassium: 4.7 mEq/L (ref 3.5–5.1)
Potassium: 4.7 mEq/L (ref 3.5–5.1)
Potassium: 5 mEq/L (ref 3.5–5.1)
Potassium: 5.3 mEq/L — ABNORMAL HIGH (ref 3.5–5.1)
Potassium: 5.3 mEq/L — ABNORMAL HIGH (ref 3.5–5.1)
Sodium: 137 mEq/L (ref 135–145)
Sodium: 135 mEq/L (ref 135–145)
Sodium: 135 mEq/L (ref 135–145)
Sodium: 139 mEq/L (ref 135–145)
Sodium: 141 mEq/L (ref 135–145)
Sodium: 131 mEq/L — ABNORMAL LOW (ref 135–145)
Sodium: 132 mEq/L — ABNORMAL LOW (ref 135–145)
Sodium: 134 mEq/L — ABNORMAL LOW (ref 135–145)
Sodium: 135 mEq/L (ref 135–145)
Sodium: 135 mEq/L (ref 135–145)
Sodium: 135 mEq/L (ref 135–145)
Sodium: 136 mEq/L (ref 135–145)
Sodium: 136 mEq/L (ref 135–145)
Sodium: 136 mEq/L (ref 135–145)
Sodium: 137 mEq/L (ref 135–145)
Sodium: 137 mEq/L (ref 135–145)
Sodium: 138 mEq/L (ref 135–145)
Sodium: 138 mEq/L (ref 135–145)
Sodium: 139 mEq/L (ref 135–145)
Sodium: 139 mEq/L (ref 135–145)

## 2011-03-15 LAB — CULTURE, RESPIRATORY W GRAM STAIN

## 2011-03-15 LAB — URINALYSIS, ROUTINE W REFLEX MICROSCOPIC
Bilirubin Urine: NEGATIVE
Glucose, UA: NEGATIVE mg/dL
Hgb urine dipstick: NEGATIVE
Ketones, ur: NEGATIVE mg/dL
Nitrite: NEGATIVE
Protein, ur: NEGATIVE mg/dL
Specific Gravity, Urine: >1.03 — ABNORMAL HIGH (ref 1.005–1.030)
Urobilinogen, UA: 0.2 mg/dL (ref 0.0–1.0)
pH: 5.5 (ref 5.0–8.0)

## 2011-03-15 LAB — CBC
HCT: 23.7 % — ABNORMAL LOW (ref 39.0–52.0)
HCT: 27.2 % — ABNORMAL LOW (ref 39.0–52.0)
HCT: 28.2 % — ABNORMAL LOW (ref 39.0–52.0)
HCT: 29.6 % — ABNORMAL LOW (ref 39.0–52.0)
HCT: 26.9 % — ABNORMAL LOW (ref 39.0–52.0)
HCT: 29.4 % — ABNORMAL LOW (ref 39.0–52.0)
HCT: 25.6 % — ABNORMAL LOW (ref 39.0–52.0)
HCT: 25.6 % — ABNORMAL LOW (ref 39.0–52.0)
HCT: 27.1 % — ABNORMAL LOW (ref 39.0–52.0)
HCT: 27.9 % — ABNORMAL LOW (ref 39.0–52.0)
HCT: 28.5 % — ABNORMAL LOW (ref 39.0–52.0)
HCT: 28.6 % — ABNORMAL LOW (ref 39.0–52.0)
HCT: 28.6 % — ABNORMAL LOW (ref 39.0–52.0)
HCT: 28.7 % — ABNORMAL LOW (ref 39.0–52.0)
HCT: 28.9 % — ABNORMAL LOW (ref 39.0–52.0)
HCT: 29.3 % — ABNORMAL LOW (ref 39.0–52.0)
HCT: 29.7 % — ABNORMAL LOW (ref 39.0–52.0)
HCT: 29.7 % — ABNORMAL LOW (ref 39.0–52.0)
HCT: 29.9 % — ABNORMAL LOW (ref 39.0–52.0)
HCT: 31.2 % — ABNORMAL LOW (ref 39.0–52.0)
Hemoglobin: 7.8 g/dL — ABNORMAL LOW (ref 13.0–17.0)
Hemoglobin: 9 g/dL — ABNORMAL LOW (ref 13.0–17.0)
Hemoglobin: 9.5 g/dL — ABNORMAL LOW (ref 13.0–17.0)
Hemoglobin: 9.9 g/dL — ABNORMAL LOW (ref 13.0–17.0)
Hemoglobin: 8.2 g/dL — ABNORMAL LOW (ref 13.0–17.0)
Hemoglobin: 8.9 g/dL — ABNORMAL LOW (ref 13.0–17.0)
Hemoglobin: 7.7 g/dL — ABNORMAL LOW (ref 13.0–17.0)
Hemoglobin: 7.8 g/dL — ABNORMAL LOW (ref 13.0–17.0)
Hemoglobin: 8.1 g/dL — ABNORMAL LOW (ref 13.0–17.0)
Hemoglobin: 8.2 g/dL — ABNORMAL LOW (ref 13.0–17.0)
Hemoglobin: 8.5 g/dL — ABNORMAL LOW (ref 13.0–17.0)
Hemoglobin: 8.6 g/dL — ABNORMAL LOW (ref 13.0–17.0)
Hemoglobin: 8.9 g/dL — ABNORMAL LOW (ref 13.0–17.0)
Hemoglobin: 8.9 g/dL — ABNORMAL LOW (ref 13.0–17.0)
Hemoglobin: 9 g/dL — ABNORMAL LOW (ref 13.0–17.0)
Hemoglobin: 9 g/dL — ABNORMAL LOW (ref 13.0–17.0)
Hemoglobin: 9.1 g/dL — ABNORMAL LOW (ref 13.0–17.0)
Hemoglobin: 9.2 g/dL — ABNORMAL LOW (ref 13.0–17.0)
Hemoglobin: 9.4 g/dL — ABNORMAL LOW (ref 13.0–17.0)
Hemoglobin: 9.4 g/dL — ABNORMAL LOW (ref 13.0–17.0)
Hemoglobin: 9.7 g/dL — ABNORMAL LOW (ref 13.0–17.0)
MCH: 28.5 pg (ref 26.0–34.0)
MCH: 28.6 pg (ref 26.0–34.0)
MCH: 29 pg (ref 26.0–34.0)
MCH: 29 pg (ref 26.0–34.0)
MCH: 27.2 pg (ref 26.0–34.0)
MCH: 27.6 pg (ref 26.0–34.0)
MCH: 27.3 pg (ref 26.0–34.0)
MCH: 27.8 pg (ref 26.0–34.0)
MCH: 27.9 pg (ref 26.0–34.0)
MCH: 27.9 pg (ref 26.0–34.0)
MCH: 28.2 pg (ref 26.0–34.0)
MCH: 28.3 pg (ref 26.0–34.0)
MCH: 28.3 pg (ref 26.0–34.0)
MCH: 28.4 pg (ref 26.0–34.0)
MCH: 28.4 pg (ref 26.0–34.0)
MCH: 28.8 pg (ref 26.0–34.0)
MCH: 28.8 pg (ref 26.0–34.0)
MCH: 28.9 pg (ref 26.0–34.0)
MCH: 28.9 pg (ref 26.0–34.0)
MCH: 29.6 pg (ref 26.0–34.0)
MCHC: 33.1 g/dL (ref 30.0–36.0)
MCHC: 33.2 g/dL (ref 30.0–36.0)
MCHC: 33.3 g/dL (ref 30.0–36.0)
MCHC: 33.5 g/dL (ref 30.0–36.0)
MCHC: 30.3 g/dL (ref 30.0–36.0)
MCHC: 30.5 g/dL (ref 30.0–36.0)
MCHC: 29.9 g/dL — ABNORMAL LOW (ref 30.0–36.0)
MCHC: 30.1 g/dL (ref 30.0–36.0)
MCHC: 30.1 g/dL (ref 30.0–36.0)
MCHC: 30.1 g/dL (ref 30.0–36.0)
MCHC: 30.3 g/dL (ref 30.0–36.0)
MCHC: 30.3 g/dL (ref 30.0–36.0)
MCHC: 30.4 g/dL (ref 30.0–36.0)
MCHC: 30.5 g/dL (ref 30.0–36.0)
MCHC: 30.8 g/dL (ref 30.0–36.0)
MCHC: 31.1 g/dL (ref 30.0–36.0)
MCHC: 31.1 g/dL (ref 30.0–36.0)
MCHC: 31.4 g/dL (ref 30.0–36.0)
MCHC: 31.6 g/dL (ref 30.0–36.0)
MCHC: 31.9 g/dL (ref 30.0–36.0)
MCV: 86.2 fL (ref 78.0–100.0)
MCV: 86.2 fL (ref 78.0–100.0)
MCV: 86.5 fL (ref 78.0–100.0)
MCV: 89.4 fL (ref 78.0–100.0)
MCV: 91 fL (ref 78.0–100.0)
MCV: 90 fL (ref 78.0–100.0)
MCV: 91.1 fL (ref 78.0–100.0)
MCV: 92.1 fL (ref 78.0–100.0)
MCV: 92.3 fL (ref 78.0–100.0)
MCV: 92.3 fL (ref 78.0–100.0)
MCV: 92.6 fL (ref 78.0–100.0)
MCV: 92.6 fL (ref 78.0–100.0)
MCV: 92.8 fL (ref 78.0–100.0)
MCV: 92.9 fL (ref 78.0–100.0)
MCV: 93.1 fL (ref 78.0–100.0)
MCV: 93.1 fL (ref 78.0–100.0)
MCV: 93.1 fL (ref 78.0–100.0)
MCV: 93.3 fL (ref 78.0–100.0)
MCV: 93.8 fL (ref 78.0–100.0)
Platelets: 283 K/uL (ref 150–400)
Platelets: 366 K/uL (ref 150–400)
Platelets: 243 10*3/uL (ref 150–400)
Platelets: 265 10*3/uL (ref 150–400)
Platelets: 105 10*3/uL — ABNORMAL LOW (ref 150–400)
Platelets: 129 10*3/uL — ABNORMAL LOW (ref 150–400)
Platelets: 142 10*3/uL — ABNORMAL LOW (ref 150–400)
Platelets: 171 10*3/uL (ref 150–400)
Platelets: 203 10*3/uL (ref 150–400)
Platelets: 216 10*3/uL (ref 150–400)
Platelets: 217 10*3/uL (ref 150–400)
Platelets: 233 10*3/uL (ref 150–400)
Platelets: 236 10*3/uL (ref 150–400)
Platelets: 254 10*3/uL (ref 150–400)
Platelets: 268 10*3/uL (ref 150–400)
Platelets: 64 10*3/uL — ABNORMAL LOW (ref 150–400)
Platelets: 64 10*3/uL — ABNORMAL LOW (ref 150–400)
Platelets: 65 10*3/uL — ABNORMAL LOW (ref 150–400)
RBC: 2.75 MIL/uL — ABNORMAL LOW (ref 4.22–5.81)
RBC: 3.15 MIL/uL — ABNORMAL LOW (ref 4.22–5.81)
RBC: 3.01 MIL/uL — ABNORMAL LOW (ref 4.22–5.81)
RBC: 3.23 MIL/uL — ABNORMAL LOW (ref 4.22–5.81)
RBC: 2.75 MIL/uL — ABNORMAL LOW (ref 4.22–5.81)
RBC: 2.9 MIL/uL — ABNORMAL LOW (ref 4.22–5.81)
RBC: 2.91 MIL/uL — ABNORMAL LOW (ref 4.22–5.81)
RBC: 3.01 MIL/uL — ABNORMAL LOW (ref 4.22–5.81)
RBC: 3.05 MIL/uL — ABNORMAL LOW (ref 4.22–5.81)
RBC: 3.07 MIL/uL — ABNORMAL LOW (ref 4.22–5.81)
RBC: 3.09 MIL/uL — ABNORMAL LOW (ref 4.22–5.81)
RBC: 3.11 MIL/uL — ABNORMAL LOW (ref 4.22–5.81)
RBC: 3.11 MIL/uL — ABNORMAL LOW (ref 4.22–5.81)
RBC: 3.14 MIL/uL — ABNORMAL LOW (ref 4.22–5.81)
RBC: 3.23 MIL/uL — ABNORMAL LOW (ref 4.22–5.81)
RBC: 3.24 MIL/uL — ABNORMAL LOW (ref 4.22–5.81)
RBC: 3.26 MIL/uL — ABNORMAL LOW (ref 4.22–5.81)
RBC: 3.37 MIL/uL — ABNORMAL LOW (ref 4.22–5.81)
RBC: 3.49 MIL/uL — ABNORMAL LOW (ref 4.22–5.81)
RDW: 18 % — ABNORMAL HIGH (ref 11.5–15.5)
RDW: 18.2 % — ABNORMAL HIGH (ref 11.5–15.5)
RDW: 18.7 % — ABNORMAL HIGH (ref 11.5–15.5)
RDW: 19.1 % — ABNORMAL HIGH (ref 11.5–15.5)
RDW: 18.1 % — ABNORMAL HIGH (ref 11.5–15.5)
RDW: 18.4 % — ABNORMAL HIGH (ref 11.5–15.5)
RDW: 17.5 % — ABNORMAL HIGH (ref 11.5–15.5)
RDW: 17.5 % — ABNORMAL HIGH (ref 11.5–15.5)
RDW: 17.7 % — ABNORMAL HIGH (ref 11.5–15.5)
RDW: 17.7 % — ABNORMAL HIGH (ref 11.5–15.5)
RDW: 17.7 % — ABNORMAL HIGH (ref 11.5–15.5)
RDW: 17.8 % — ABNORMAL HIGH (ref 11.5–15.5)
RDW: 17.9 % — ABNORMAL HIGH (ref 11.5–15.5)
RDW: 17.9 % — ABNORMAL HIGH (ref 11.5–15.5)
RDW: 18 % — ABNORMAL HIGH (ref 11.5–15.5)
RDW: 18.1 % — ABNORMAL HIGH (ref 11.5–15.5)
RDW: 18.1 % — ABNORMAL HIGH (ref 11.5–15.5)
RDW: 18.1 % — ABNORMAL HIGH (ref 11.5–15.5)
RDW: 18.3 % — ABNORMAL HIGH (ref 11.5–15.5)
RDW: 18.3 % — ABNORMAL HIGH (ref 11.5–15.5)
WBC: 6.7 K/uL (ref 4.0–10.5)
WBC: 8.5 K/uL (ref 4.0–10.5)
WBC: 6.7 10*3/uL (ref 4.0–10.5)
WBC: 6.7 10*3/uL (ref 4.0–10.5)
WBC: 10.1 10*3/uL (ref 4.0–10.5)
WBC: 10.3 10*3/uL (ref 4.0–10.5)
WBC: 12.9 10*3/uL — ABNORMAL HIGH (ref 4.0–10.5)
WBC: 13.2 10*3/uL — ABNORMAL HIGH (ref 4.0–10.5)
WBC: 15.4 10*3/uL — ABNORMAL HIGH (ref 4.0–10.5)
WBC: 15.4 10*3/uL — ABNORMAL HIGH (ref 4.0–10.5)
WBC: 16.9 10*3/uL — ABNORMAL HIGH (ref 4.0–10.5)
WBC: 17.5 10*3/uL — ABNORMAL HIGH (ref 4.0–10.5)
WBC: 19.9 10*3/uL — ABNORMAL HIGH (ref 4.0–10.5)
WBC: 24.9 10*3/uL — ABNORMAL HIGH (ref 4.0–10.5)
WBC: 8.1 10*3/uL (ref 4.0–10.5)
WBC: 8.6 10*3/uL (ref 4.0–10.5)
WBC: 9.1 10*3/uL (ref 4.0–10.5)
WBC: 9.1 10*3/uL (ref 4.0–10.5)
WBC: 9.2 10*3/uL (ref 4.0–10.5)

## 2011-03-15 LAB — HEMOGLOBIN AND HEMATOCRIT, BLOOD
HCT: 28.3 % — ABNORMAL LOW (ref 39.0–52.0)
HCT: 28.7 % — ABNORMAL LOW (ref 39.0–52.0)
HCT: 29.4 % — ABNORMAL LOW (ref 39.0–52.0)
Hemoglobin: 9.6 g/dL — ABNORMAL LOW (ref 13.0–17.0)

## 2011-03-15 LAB — POCT I-STAT 3, ART BLOOD GAS (G3+)
Acid-Base Excess: 2 mmol/L (ref 0.0–2.0)
Acid-Base Excess: 9 mmol/L — ABNORMAL HIGH (ref 0.0–2.0)
Bicarbonate: 26.2 mEq/L — ABNORMAL HIGH (ref 20.0–24.0)
Bicarbonate: 34.6 mEq/L — ABNORMAL HIGH (ref 20.0–24.0)
O2 Saturation: 100 %
O2 Saturation: 92 %
Patient temperature: 99.6
Patient temperature: 99.8
TCO2: 27 mmol/L (ref 0–100)
TCO2: 36 mmol/L (ref 0–100)
pCO2 arterial: 38 mmHg (ref 35.0–45.0)
pCO2 arterial: 55.4 mmHg — ABNORMAL HIGH (ref 35.0–45.0)
pH, Arterial: 7.406 (ref 7.350–7.450)
pH, Arterial: 7.448 (ref 7.350–7.450)
pO2, Arterial: 191 mmHg — ABNORMAL HIGH (ref 80.0–100.0)
pO2, Arterial: 63 mmHg — ABNORMAL LOW (ref 80.0–100.0)

## 2011-03-15 LAB — DIFFERENTIAL
Basophils Absolute: 0 10*3/uL (ref 0.0–0.1)
Basophils Absolute: 0 10*3/uL (ref 0.0–0.1)
Basophils Absolute: 0 K/uL (ref 0.0–0.1)
Basophils Absolute: 0.1 K/uL (ref 0.0–0.1)
Basophils Relative: 1 % (ref 0–1)
Basophils Relative: 1 % (ref 0–1)
Basophils Relative: 1 % (ref 0–1)
Eosinophils Absolute: 0.1 10*3/uL (ref 0.0–0.7)
Eosinophils Absolute: 0.1 10*3/uL (ref 0.0–0.7)
Eosinophils Absolute: 0.1 K/uL (ref 0.0–0.7)
Eosinophils Absolute: 0.1 K/uL (ref 0.0–0.7)
Eosinophils Relative: 1 % (ref 0–5)
Eosinophils Relative: 1 % (ref 0–5)
Eosinophils Relative: 2 % (ref 0–5)
Lymphocytes Relative: 11 % — ABNORMAL LOW (ref 12–46)
Lymphocytes Relative: 16 % (ref 12–46)
Lymphs Abs: 0.8 K/uL (ref 0.7–4.0)
Lymphs Abs: 0.9 10*3/uL (ref 0.7–4.0)
Lymphs Abs: 1 K/uL (ref 0.7–4.0)
Monocytes Absolute: 1 K/uL (ref 0.1–1.0)
Monocytes Absolute: 1.2 10*3/uL — ABNORMAL HIGH (ref 0.1–1.0)
Monocytes Absolute: 1.3 10*3/uL — ABNORMAL HIGH (ref 0.1–1.0)
Monocytes Absolute: 1.3 K/uL — ABNORMAL HIGH (ref 0.1–1.0)
Monocytes Relative: 15 % — ABNORMAL HIGH (ref 3–12)
Monocytes Relative: 20 % — ABNORMAL HIGH (ref 3–12)
Monocytes Relative: 24 % — ABNORMAL HIGH (ref 3–12)
Neutro Abs: 3.2 K/uL (ref 1.7–7.7)
Neutro Abs: 6.1 K/uL (ref 1.7–7.7)
Neutrophils Relative %: 62 % (ref 43–77)
Neutrophils Relative %: 72 % (ref 43–77)

## 2011-03-15 LAB — PROTIME-INR
INR: 1.25 (ref 0.00–1.49)
INR: 1.25 (ref 0.00–1.49)
INR: 1.59 — ABNORMAL HIGH (ref 0.00–1.49)
INR: 1.34 (ref 0.00–1.49)
INR: 1.4 (ref 0.00–1.49)
INR: 1.43 (ref 0.00–1.49)
INR: 1.47 (ref 0.00–1.49)
INR: 1.5 — ABNORMAL HIGH (ref 0.00–1.49)
INR: 1.51 — ABNORMAL HIGH (ref 0.00–1.49)
INR: 1.65 — ABNORMAL HIGH (ref 0.00–1.49)
INR: 1.67 — ABNORMAL HIGH (ref 0.00–1.49)
INR: 1.94 — ABNORMAL HIGH (ref 0.00–1.49)
Prothrombin Time: 15.9 seconds — ABNORMAL HIGH (ref 11.6–15.2)
Prothrombin Time: 15.9 seconds — ABNORMAL HIGH (ref 11.6–15.2)
Prothrombin Time: 19.1 seconds — ABNORMAL HIGH (ref 11.6–15.2)
Prothrombin Time: 16.8 seconds — ABNORMAL HIGH (ref 11.6–15.2)
Prothrombin Time: 17.4 seconds — ABNORMAL HIGH (ref 11.6–15.2)
Prothrombin Time: 17.6 seconds — ABNORMAL HIGH (ref 11.6–15.2)
Prothrombin Time: 18 seconds — ABNORMAL HIGH (ref 11.6–15.2)
Prothrombin Time: 18.3 seconds — ABNORMAL HIGH (ref 11.6–15.2)
Prothrombin Time: 18.4 seconds — ABNORMAL HIGH (ref 11.6–15.2)
Prothrombin Time: 19.7 seconds — ABNORMAL HIGH (ref 11.6–15.2)
Prothrombin Time: 19.9 seconds — ABNORMAL HIGH (ref 11.6–15.2)
Prothrombin Time: 22.3 seconds — ABNORMAL HIGH (ref 11.6–15.2)
Prothrombin Time: 22.4 seconds — ABNORMAL HIGH (ref 11.6–15.2)

## 2011-03-15 LAB — BASIC METABOLIC PANEL WITH GFR
BUN: 20 mg/dL (ref 6–23)
CO2: 27 meq/L (ref 19–32)
Calcium: 8.3 mg/dL — ABNORMAL LOW (ref 8.4–10.5)
Chloride: 98 meq/L (ref 96–112)
Creatinine, Ser: 1.73 mg/dL — ABNORMAL HIGH (ref 0.4–1.5)
GFR calc non Af Amer: 39 mL/min — ABNORMAL LOW
Glucose, Bld: 141 mg/dL — ABNORMAL HIGH (ref 70–99)
Potassium: 5.7 meq/L — ABNORMAL HIGH (ref 3.5–5.1)
Sodium: 130 meq/L — ABNORMAL LOW (ref 135–145)

## 2011-03-15 LAB — HEPARIN INDUCED THROMBOCYTOPENIA PNL
Heparin Induced Plt Ab: NEGATIVE
Patient O.D.: 0.221
UFH High Dose UFH H: 0 % Release
UFH Low Dose 0.1 IU/mL: 0 % Release
UFH Low Dose 0.5 IU/mL: 0 % Release
UFH SRA Result: NEGATIVE

## 2011-03-15 LAB — CROSSMATCH
ABO/RH(D): O NEG
Antibody Screen: NEGATIVE
Antibody Screen: NEGATIVE

## 2011-03-15 LAB — CULTURE, BLOOD (ROUTINE X 2)
Culture: NO GROWTH
Report Status: 9282011

## 2011-03-15 LAB — HEPARIN LEVEL (UNFRACTIONATED)
Heparin Unfractionated: 0.72 IU/mL — ABNORMAL HIGH (ref 0.30–0.70)
Heparin Unfractionated: 0.94 IU/mL — ABNORMAL HIGH (ref 0.30–0.70)

## 2011-03-15 LAB — HEPATIC FUNCTION PANEL
ALT: 30 U/L (ref 0–53)
AST: 41 U/L — ABNORMAL HIGH (ref 0–37)
Albumin: 2.9 g/dL — ABNORMAL LOW (ref 3.5–5.2)
Alkaline Phosphatase: 51 U/L (ref 39–117)
Bilirubin, Direct: 0.4 mg/dL — ABNORMAL HIGH (ref 0.0–0.3)
Indirect Bilirubin: 0.9 mg/dL (ref 0.3–0.9)
Total Bilirubin: 1.3 mg/dL — ABNORMAL HIGH (ref 0.3–1.2)
Total Protein: 5.9 g/dL — ABNORMAL LOW (ref 6.0–8.3)

## 2011-03-15 LAB — LIPASE, BLOOD: Lipase: 30 U/L (ref 11–59)

## 2011-03-15 LAB — MAGNESIUM: Magnesium: 2.3 mg/dL (ref 1.5–2.5)

## 2011-03-15 LAB — DIGOXIN LEVEL: Digoxin Level: 1.2 ng/mL (ref 0.8–2.0)

## 2011-03-15 LAB — VANCOMYCIN, TROUGH: Vancomycin Tr: <5 ug/mL — ABNORMAL LOW (ref 10.0–20.0)

## 2011-03-15 LAB — URINE MICROSCOPIC-ADD ON

## 2011-03-15 LAB — ABO/RH: ABO/RH(D): O NEG

## 2011-03-15 LAB — WOUND CULTURE: Culture: NO GROWTH

## 2011-03-16 LAB — BASIC METABOLIC PANEL
BUN: 13 mg/dL (ref 6–23)
CO2: 27 mEq/L (ref 19–32)
Calcium: 8 mg/dL — ABNORMAL LOW (ref 8.4–10.5)
Chloride: 108 mEq/L (ref 96–112)
Creatinine, Ser: 1.25 mg/dL (ref 0.4–1.5)
GFR calc Af Amer: >60 mL/min (ref >60)
GFR calc non Af Amer: 57 mL/min — ABNORMAL LOW (ref >60)
Glucose, Bld: 130 mg/dL — ABNORMAL HIGH (ref 70–99)
Potassium: 4.4 mEq/L (ref 3.5–5.1)
Sodium: 142 mEq/L (ref 135–145)

## 2011-03-16 LAB — COMPREHENSIVE METABOLIC PANEL
ALT: 13 U/L (ref 0–53)
AST: 19 U/L (ref 0–37)
Albumin: 3.8 g/dL (ref 3.5–5.2)
Alkaline Phosphatase: 77 U/L (ref 39–117)
BUN: 12 mg/dL (ref 6–23)
CO2: 26 mEq/L (ref 19–32)
Calcium: 9.5 mg/dL (ref 8.4–10.5)
Chloride: 107 mEq/L (ref 96–112)
Creatinine, Ser: 0.99 mg/dL (ref 0.4–1.5)
GFR calc Af Amer: >60 mL/min (ref >60)
GFR calc non Af Amer: >60 mL/min (ref >60)
Glucose, Bld: 143 mg/dL — ABNORMAL HIGH (ref 70–99)
Potassium: 4.4 mEq/L (ref 3.5–5.1)
Sodium: 140 mEq/L (ref 135–145)
Total Bilirubin: 1.7 mg/dL — ABNORMAL HIGH (ref 0.3–1.2)
Total Protein: 6.9 g/dL (ref 6.0–8.3)

## 2011-03-16 LAB — URINALYSIS, ROUTINE W REFLEX MICROSCOPIC
Bilirubin Urine: NEGATIVE
Bilirubin Urine: NEGATIVE
Glucose, UA: NEGATIVE mg/dL
Glucose, UA: NEGATIVE mg/dL
Hgb urine dipstick: NEGATIVE
Ketones, ur: NEGATIVE mg/dL
Ketones, ur: NEGATIVE mg/dL
Nitrite: NEGATIVE
Nitrite: NEGATIVE
Protein, ur: 30 mg/dL — AB
Protein, ur: NEGATIVE mg/dL
Specific Gravity, Urine: 1.019 (ref 1.005–1.030)
Specific Gravity, Urine: 1.021 (ref 1.005–1.030)
Urobilinogen, UA: 0.2 mg/dL (ref 0.0–1.0)
Urobilinogen, UA: 4 mg/dL — ABNORMAL HIGH (ref 0.0–1.0)
pH: 5 (ref 5.0–8.0)
pH: 6.5 (ref 5.0–8.0)

## 2011-03-16 LAB — CBC
HCT: 32.9 % — ABNORMAL LOW (ref 39.0–52.0)
HCT: 34.4 % — ABNORMAL LOW (ref 39.0–52.0)
HCT: 34.7 % — ABNORMAL LOW (ref 39.0–52.0)
HCT: 35 % — ABNORMAL LOW (ref 39.0–52.0)
HCT: 38.7 % — ABNORMAL LOW (ref 39.0–52.0)
Hemoglobin: 10.2 g/dL — ABNORMAL LOW (ref 13.0–17.0)
Hemoglobin: 10.9 g/dL — ABNORMAL LOW (ref 13.0–17.0)
Hemoglobin: 11.1 g/dL — ABNORMAL LOW (ref 13.0–17.0)
Hemoglobin: 11.2 g/dL — ABNORMAL LOW (ref 13.0–17.0)
Hemoglobin: 12.4 g/dL — ABNORMAL LOW (ref 13.0–17.0)
MCH: 28.5 pg (ref 26.0–34.0)
MCH: 28.6 pg (ref 26.0–34.0)
MCH: 28.8 pg (ref 26.0–34.0)
MCH: 28.9 pg (ref 26.0–34.0)
MCH: 29 pg (ref 26.0–34.0)
MCHC: 31 g/dL (ref 30.0–36.0)
MCHC: 31.4 g/dL (ref 30.0–36.0)
MCHC: 32 g/dL (ref 30.0–36.0)
MCHC: 32 g/dL (ref 30.0–36.0)
MCHC: 32.3 g/dL (ref 30.0–36.0)
MCV: 88.4 fL (ref 78.0–100.0)
MCV: 89.3 fL (ref 78.0–100.0)
MCV: 90.6 fL (ref 78.0–100.0)
MCV: 91.8 fL (ref 78.0–100.0)
MCV: 93.2 fL (ref 78.0–100.0)
Platelets: 114 10*3/uL — ABNORMAL LOW (ref 150–400)
Platelets: 125 10*3/uL — ABNORMAL LOW (ref 150–400)
Platelets: 155 10*3/uL (ref 150–400)
Platelets: 85 10*3/uL — ABNORMAL LOW (ref 150–400)
Platelets: 89 10*3/uL — ABNORMAL LOW (ref 150–400)
RBC: 3.53 MIL/uL — ABNORMAL LOW (ref 4.22–5.81)
RBC: 3.78 MIL/uL — ABNORMAL LOW (ref 4.22–5.81)
RBC: 3.89 MIL/uL — ABNORMAL LOW (ref 4.22–5.81)
RBC: 3.92 MIL/uL — ABNORMAL LOW (ref 4.22–5.81)
RBC: 4.27 MIL/uL (ref 4.22–5.81)
RDW: 17.9 % — ABNORMAL HIGH (ref 11.5–15.5)
RDW: 18 % — ABNORMAL HIGH (ref 11.5–15.5)
RDW: 18.1 % — ABNORMAL HIGH (ref 11.5–15.5)
RDW: 18.2 % — ABNORMAL HIGH (ref 11.5–15.5)
RDW: 18.4 % — ABNORMAL HIGH (ref 11.5–15.5)
WBC: 24.6 10*3/uL — ABNORMAL HIGH (ref 4.0–10.5)
WBC: 25.7 10*3/uL — ABNORMAL HIGH (ref 4.0–10.5)
WBC: 26.9 10*3/uL — ABNORMAL HIGH (ref 4.0–10.5)
WBC: 33.4 10*3/uL — ABNORMAL HIGH (ref 4.0–10.5)
WBC: 7.8 10*3/uL (ref 4.0–10.5)

## 2011-03-16 LAB — POCT I-STAT 4, (NA,K, GLUC, HGB,HCT)
Glucose, Bld: 110 mg/dL — ABNORMAL HIGH (ref 70–99)
Glucose, Bld: 121 mg/dL — ABNORMAL HIGH (ref 70–99)
Glucose, Bld: 126 mg/dL — ABNORMAL HIGH (ref 70–99)
Glucose, Bld: 127 mg/dL — ABNORMAL HIGH (ref 70–99)
Glucose, Bld: 133 mg/dL — ABNORMAL HIGH (ref 70–99)
Glucose, Bld: 135 mg/dL — ABNORMAL HIGH (ref 70–99)
Glucose, Bld: 151 mg/dL — ABNORMAL HIGH (ref 70–99)
Glucose, Bld: 152 mg/dL — ABNORMAL HIGH (ref 70–99)
Glucose, Bld: 160 mg/dL — ABNORMAL HIGH (ref 70–99)
HCT: 22 % — ABNORMAL LOW (ref 39.0–52.0)
HCT: 22 % — ABNORMAL LOW (ref 39.0–52.0)
HCT: 24 % — ABNORMAL LOW (ref 39.0–52.0)
HCT: 25 % — ABNORMAL LOW (ref 39.0–52.0)
HCT: 26 % — ABNORMAL LOW (ref 39.0–52.0)
HCT: 26 % — ABNORMAL LOW (ref 39.0–52.0)
HCT: 30 % — ABNORMAL LOW (ref 39.0–52.0)
HCT: 33 % — ABNORMAL LOW (ref 39.0–52.0)
HCT: 34 % — ABNORMAL LOW (ref 39.0–52.0)
Hemoglobin: 10.2 g/dL — ABNORMAL LOW (ref 13.0–17.0)
Hemoglobin: 11.2 g/dL — ABNORMAL LOW (ref 13.0–17.0)
Hemoglobin: 11.6 g/dL — ABNORMAL LOW (ref 13.0–17.0)
Hemoglobin: 7.5 g/dL — ABNORMAL LOW (ref 13.0–17.0)
Hemoglobin: 7.5 g/dL — ABNORMAL LOW (ref 13.0–17.0)
Hemoglobin: 8.2 g/dL — ABNORMAL LOW (ref 13.0–17.0)
Hemoglobin: 8.5 g/dL — ABNORMAL LOW (ref 13.0–17.0)
Hemoglobin: 8.8 g/dL — ABNORMAL LOW (ref 13.0–17.0)
Hemoglobin: 8.8 g/dL — ABNORMAL LOW (ref 13.0–17.0)
Potassium: 3.7 mEq/L (ref 3.5–5.1)
Potassium: 3.7 mEq/L (ref 3.5–5.1)
Potassium: 3.7 mEq/L (ref 3.5–5.1)
Potassium: 3.8 mEq/L (ref 3.5–5.1)
Potassium: 3.9 mEq/L (ref 3.5–5.1)
Potassium: 4.1 mEq/L (ref 3.5–5.1)
Potassium: 4.2 mEq/L (ref 3.5–5.1)
Potassium: 4.3 mEq/L (ref 3.5–5.1)
Potassium: 4.7 mEq/L (ref 3.5–5.1)
Sodium: 129 mEq/L — ABNORMAL LOW (ref 135–145)
Sodium: 132 mEq/L — ABNORMAL LOW (ref 135–145)
Sodium: 133 mEq/L — ABNORMAL LOW (ref 135–145)
Sodium: 135 mEq/L (ref 135–145)
Sodium: 136 mEq/L (ref 135–145)
Sodium: 138 mEq/L (ref 135–145)
Sodium: 139 mEq/L (ref 135–145)
Sodium: 141 mEq/L (ref 135–145)
Sodium: 141 mEq/L (ref 135–145)

## 2011-03-16 LAB — POCT I-STAT 3, ART BLOOD GAS (G3+)
Acid-Base Excess: 2 mmol/L (ref 0.0–2.0)
Acid-Base Excess: 4 mmol/L — ABNORMAL HIGH (ref 0.0–2.0)
Acid-Base Excess: 4 mmol/L — ABNORMAL HIGH (ref 0.0–2.0)
Acid-base deficit: 3 mmol/L — ABNORMAL HIGH (ref 0.0–2.0)
Acid-base deficit: 3 mmol/L — ABNORMAL HIGH (ref 0.0–2.0)
Acid-base deficit: 3 mmol/L — ABNORMAL HIGH (ref 0.0–2.0)
Acid-base deficit: 5 mmol/L — ABNORMAL HIGH (ref 0.0–2.0)
Bicarbonate: 21.1 mEq/L (ref 20.0–24.0)
Bicarbonate: 23 mEq/L (ref 20.0–24.0)
Bicarbonate: 23 mEq/L (ref 20.0–24.0)
Bicarbonate: 24.7 mEq/L — ABNORMAL HIGH (ref 20.0–24.0)
Bicarbonate: 26 mEq/L — ABNORMAL HIGH (ref 20.0–24.0)
Bicarbonate: 27.3 mEq/L — ABNORMAL HIGH (ref 20.0–24.0)
Bicarbonate: 29.3 mEq/L — ABNORMAL HIGH (ref 20.0–24.0)
Bicarbonate: 30.1 mEq/L — ABNORMAL HIGH (ref 20.0–24.0)
O2 Saturation: 100 %
O2 Saturation: 100 %
O2 Saturation: 100 %
O2 Saturation: 100 %
O2 Saturation: 100 %
O2 Saturation: 80 %
O2 Saturation: 92 %
O2 Saturation: 97 %
Patient temperature: 36
Patient temperature: 37.7
Patient temperature: 38.1
Patient temperature: 38.2
Patient temperature: 38.4
TCO2: 22 mmol/L (ref 0–100)
TCO2: 24 mmol/L (ref 0–100)
TCO2: 24 mmol/L (ref 0–100)
TCO2: 26 mmol/L (ref 0–100)
TCO2: 27 mmol/L (ref 0–100)
TCO2: 29 mmol/L (ref 0–100)
TCO2: 31 mmol/L (ref 0–100)
TCO2: 32 mmol/L (ref 0–100)
pCO2 arterial: 42.8 mmHg (ref 35.0–45.0)
pCO2 arterial: 43.4 mmHg (ref 35.0–45.0)
pCO2 arterial: 44.8 mmHg (ref 35.0–45.0)
pCO2 arterial: 45 mmHg (ref 35.0–45.0)
pCO2 arterial: 46.5 mmHg — ABNORMAL HIGH (ref 35.0–45.0)
pCO2 arterial: 48.2 mmHg — ABNORMAL HIGH (ref 35.0–45.0)
pCO2 arterial: 49 mmHg — ABNORMAL HIGH (ref 35.0–45.0)
pCO2 arterial: 56.5 mmHg — ABNORMAL HIGH (ref 35.0–45.0)
pH, Arterial: 7.252 — ABNORMAL LOW (ref 7.350–7.450)
pH, Arterial: 7.292 — ABNORMAL LOW (ref 7.350–7.450)
pH, Arterial: 7.301 — ABNORMAL LOW (ref 7.350–7.450)
pH, Arterial: 7.31 — ABNORMAL LOW (ref 7.350–7.450)
pH, Arterial: 7.368 (ref 7.350–7.450)
pH, Arterial: 7.396 (ref 7.350–7.450)
pH, Arterial: 7.413 (ref 7.350–7.450)
pH, Arterial: 7.422 (ref 7.350–7.450)
pO2, Arterial: 101 mmHg — ABNORMAL HIGH (ref 80.0–100.0)
pO2, Arterial: 188 mmHg — ABNORMAL HIGH (ref 80.0–100.0)
pO2, Arterial: 207 mmHg — ABNORMAL HIGH (ref 80.0–100.0)
pO2, Arterial: 231 mmHg — ABNORMAL HIGH (ref 80.0–100.0)
pO2, Arterial: 309 mmHg — ABNORMAL HIGH (ref 80.0–100.0)
pO2, Arterial: 504 mmHg — ABNORMAL HIGH (ref 80.0–100.0)
pO2, Arterial: 53 mmHg — ABNORMAL LOW (ref 80.0–100.0)
pO2, Arterial: 76 mmHg — ABNORMAL LOW (ref 80.0–100.0)

## 2011-03-16 LAB — GLUCOSE, CAPILLARY
Glucose-Capillary: 118 mg/dL — ABNORMAL HIGH (ref 70–99)
Glucose-Capillary: 124 mg/dL — ABNORMAL HIGH (ref 70–99)
Glucose-Capillary: 128 mg/dL — ABNORMAL HIGH (ref 70–99)
Glucose-Capillary: 133 mg/dL — ABNORMAL HIGH (ref 70–99)
Glucose-Capillary: 137 mg/dL — ABNORMAL HIGH (ref 70–99)
Glucose-Capillary: 141 mg/dL — ABNORMAL HIGH (ref 70–99)
Glucose-Capillary: 155 mg/dL — ABNORMAL HIGH (ref 70–99)
Glucose-Capillary: 174 mg/dL — ABNORMAL HIGH (ref 70–99)
Glucose-Capillary: 186 mg/dL — ABNORMAL HIGH (ref 70–99)
Glucose-Capillary: 186 mg/dL — ABNORMAL HIGH (ref 70–99)
Glucose-Capillary: 208 mg/dL — ABNORMAL HIGH (ref 70–99)
Glucose-Capillary: 83 mg/dL (ref 70–99)
Glucose-Capillary: 85 mg/dL (ref 70–99)
Glucose-Capillary: 97 mg/dL (ref 70–99)

## 2011-03-16 LAB — POCT I-STAT, CHEM 8
BUN: 14 mg/dL (ref 6–23)
Calcium, Ion: 1.06 mmol/L — ABNORMAL LOW (ref 1.12–1.32)
Chloride: 104 mEq/L (ref 96–112)
Creatinine, Ser: 1.1 mg/dL (ref 0.4–1.5)
Glucose, Bld: 187 mg/dL — ABNORMAL HIGH (ref 70–99)
HCT: 35 % — ABNORMAL LOW (ref 39.0–52.0)
Hemoglobin: 11.9 g/dL — ABNORMAL LOW (ref 13.0–17.0)
Potassium: 5.2 mEq/L — ABNORMAL HIGH (ref 3.5–5.1)
Sodium: 140 mEq/L (ref 135–145)
TCO2: 25 mmol/L (ref 0–100)

## 2011-03-16 LAB — PLATELET COUNT: Platelets: 102 10*3/uL — ABNORMAL LOW (ref 150–400)

## 2011-03-16 LAB — POCT I-STAT 3, VENOUS BLOOD GAS (G3P V)
Acid-Base Excess: 3 mmol/L — ABNORMAL HIGH (ref 0.0–2.0)
Bicarbonate: 27 mEq/L — ABNORMAL HIGH (ref 20.0–24.0)
Bicarbonate: 27.5 mEq/L — ABNORMAL HIGH (ref 20.0–24.0)
Bicarbonate: 28.6 mEq/L — ABNORMAL HIGH (ref 20.0–24.0)
O2 Saturation: 81 %
TCO2: 29 mmol/L (ref 0–100)
TCO2: 30 mmol/L (ref 0–100)
pCO2, Ven: 43.7 mmHg — ABNORMAL LOW (ref 45.0–50.0)
pCO2, Ven: 49.6 mmHg (ref 45.0–50.0)
pH, Ven: 7.353 — ABNORMAL HIGH (ref 7.250–7.300)
pH, Ven: 7.369 — ABNORMAL HIGH (ref 7.250–7.300)
pH, Ven: 7.399 — ABNORMAL HIGH (ref 7.250–7.300)
pO2, Ven: 29 mmHg — CL (ref 30.0–45.0)
pO2, Ven: 48 mmHg — ABNORMAL HIGH (ref 30.0–45.0)

## 2011-03-16 LAB — SURGICAL PCR SCREEN
MRSA, PCR: NEGATIVE
Staphylococcus aureus: NEGATIVE

## 2011-03-16 LAB — BLOOD GAS, ARTERIAL
Acid-Base Excess: 3.4 mmol/L — ABNORMAL HIGH (ref 0.0–2.0)
Bicarbonate: 27.3 mEq/L — ABNORMAL HIGH (ref 20.0–24.0)
Drawn by: 206361
FIO2: 0.21 %
O2 Saturation: 96.6 %
Patient temperature: 98.6
TCO2: 28.6 mmol/L (ref 0–100)
pCO2 arterial: 40.8 mmHg (ref 35.0–45.0)
pH, Arterial: 7.441 (ref 7.350–7.450)
pO2, Arterial: 79.1 mmHg — ABNORMAL LOW (ref 80.0–100.0)

## 2011-03-16 LAB — APTT
aPTT: 30 seconds (ref 24–37)
aPTT: 41 seconds — ABNORMAL HIGH (ref 24–37)

## 2011-03-16 LAB — CREATININE, SERUM
Creatinine, Ser: 1.12 mg/dL (ref 0.4–1.5)
Creatinine, Ser: 1.9 mg/dL — ABNORMAL HIGH (ref 0.4–1.5)
GFR calc Af Amer: 42 mL/min — ABNORMAL LOW (ref >60)
GFR calc Af Amer: >60 mL/min (ref >60)
GFR calc non Af Amer: 35 mL/min — ABNORMAL LOW (ref >60)
GFR calc non Af Amer: >60 mL/min (ref >60)

## 2011-03-16 LAB — URINE CULTURE
Colony Count: NO GROWTH
Culture  Setup Time: 201108311350
Culture: NO GROWTH

## 2011-03-16 LAB — MAGNESIUM
Magnesium: 2.6 mg/dL — ABNORMAL HIGH (ref 1.5–2.5)
Magnesium: 2.6 mg/dL — ABNORMAL HIGH (ref 1.5–2.5)
Magnesium: 2.8 mg/dL — ABNORMAL HIGH (ref 1.5–2.5)

## 2011-03-16 LAB — HEMOGLOBIN AND HEMATOCRIT, BLOOD
HCT: 21 % — ABNORMAL LOW (ref 39.0–52.0)
Hemoglobin: 6.9 g/dL — CL (ref 13.0–17.0)

## 2011-03-16 LAB — PROTIME-INR
INR: 1.01 (ref 0.00–1.49)
INR: 1.52 — ABNORMAL HIGH (ref 0.00–1.49)
Prothrombin Time: 13.5 seconds (ref 11.6–15.2)
Prothrombin Time: 18.5 seconds — ABNORMAL HIGH (ref 11.6–15.2)

## 2011-03-16 LAB — URINE MICROSCOPIC-ADD ON

## 2011-03-16 LAB — TYPE AND SCREEN
ABO/RH(D): O NEG
Antibody Screen: NEGATIVE

## 2011-03-16 LAB — ABO/RH: ABO/RH(D): O NEG

## 2011-03-16 LAB — HEMOGLOBIN A1C
Hgb A1c MFr Bld: 5.9 % — ABNORMAL HIGH (ref <5.7)
Mean Plasma Glucose: 123 mg/dL — ABNORMAL HIGH (ref <117)

## 2011-03-19 LAB — BLOOD GAS, ARTERIAL
Bicarbonate: 23.5 mEq/L (ref 20.0–24.0)
TCO2: 20.9 mmol/L (ref 0–100)
pCO2 arterial: 37 mmHg (ref 35.0–45.0)
pH, Arterial: 7.419 (ref 7.350–7.450)
pO2, Arterial: 86.7 mmHg (ref 80.0–100.0)

## 2011-03-23 ENCOUNTER — Encounter: Payer: Self-pay | Admitting: Cardiology

## 2011-03-23 ENCOUNTER — Ambulatory Visit (INDEPENDENT_AMBULATORY_CARE_PROVIDER_SITE_OTHER): Payer: PRIVATE HEALTH INSURANCE | Admitting: Cardiology

## 2011-03-23 DIAGNOSIS — I4891 Unspecified atrial fibrillation: Secondary | ICD-10-CM

## 2011-03-23 DIAGNOSIS — I359 Nonrheumatic aortic valve disorder, unspecified: Secondary | ICD-10-CM

## 2011-03-23 DIAGNOSIS — J449 Chronic obstructive pulmonary disease, unspecified: Secondary | ICD-10-CM

## 2011-03-23 DIAGNOSIS — I10 Essential (primary) hypertension: Secondary | ICD-10-CM

## 2011-03-23 LAB — POCT INR: INR: 2.3

## 2011-03-23 MED ORDER — FUROSEMIDE 40 MG PO TABS: 20.0000 mg | ORAL_TABLET | Freq: Every day | ORAL | Status: DC

## 2011-03-23 NOTE — Assessment & Plan Note (Signed)
Blood pressure is well controlled. Continue diltiazem and lisinopril. Continue sodium restriction. Since he is having no edema we will reduce his Lasix to 20 mg daily.

## 2011-03-23 NOTE — Assessment & Plan Note (Signed)
Echocardiogram in January of 2012 showed good prosthetic aortic valve function. Left ventricular function was normal. We'll continue long-term aspirin therapy. He needs routine SBE prophylaxis.

## 2011-03-23 NOTE — Patient Instructions (Signed)
Stop Amiodarone, Coumadin, Digoxin. Reduce Lasix (furosemide) to 20 mg daily.

## 2011-03-23 NOTE — Progress Notes (Signed)
HPI [name] [name] is seen for followup today. He states he feels very well. He has had a marked improvement in his shortness of breath since his valve surgery. He denies any chest pain, shortness of breath, palpitations, or dizziness. He was instructed to stop his amiodarone on his last visit but he is still taking it. He is active doing all his own yard work. He has had no edema. No Known Allergies  Current Outpatient Prescriptions on File Prior to Visit  Medication Sig Dispense Refill  . allopurinol (ZYLOPRIM) 300 MG tablet Take 300 mg by mouth daily.        Marland Kitchen aspirin 81 MG tablet Take 81 mg by mouth daily.        Marland Kitchen diltiazem (DILACOR XR) 240 MG 24 hr capsule Take 240 mg by mouth daily.        Marland Kitchen glyBURIDE (DIABETA) 2.5 MG tablet Take 2.5 mg by mouth daily.        Marland Kitchen lisinopril (PRINIVIL,ZESTRIL) 10 MG tablet Take 10 mg by mouth daily.        . potassium chloride SA (K-DUR,KLOR-CON) 20 MEQ tablet Take 20 mEq by mouth daily.        Marland Kitchen DISCONTD: digoxin (LANOXIN) 0.125 MG tablet Take 125 mcg by mouth daily.       Marland Kitchen DISCONTD: furosemide (LASIX) 40 MG tablet Take 40 mg by mouth daily.        Marland Kitchen DISCONTD: warfarin (COUMADIN) 5 MG tablet Take 5 mg by mouth as directed.          Past Medical History  Diagnosis Date  . COPD (chronic obstructive pulmonary disease)   . AF (atrial fibrillation)   . Hypertension   . Gout   . Arthritis   . Pneumonia     history of pneumonia  . Small bowel obstruction   . Renal insufficiency   . Aortic stenosis     Past Surgical History  Procedure Date  . Rotator cuff repair   . Cardiac valve replacement     aortic valve with a tissue prosthesis and left sided maze proc  . S/p rewiring of sternum for dehiscence   . Gallbladder surgery     Family History  Problem Relation Age of Onset  . Hypertension Mother   . Stroke Mother   . Heart disease Sister   . Kidney disease Sister     History   Social History  . Marital Status: Married    Spouse Name: N/A      Number of Children: N/A  . Years of Education: N/A   Occupational History  . Not on file.   Social History Main Topics  . Smoking status: Former Smoker    Types: Cigarettes    Quit date: 01/18/2005  . Smokeless tobacco: Not on file  . Alcohol Use: No  . Drug Use: Not on file  . Sexually Active: Not on file   Other Topics Concern  . Not on file   Social History Narrative  . No narrative on file    ROS The patient denies any heat or cold intolerance.  No weight gain or weight loss.  The patient denies headaches or blurry vision.  There is no cough or sputum production.  The patient denies dizziness.  There is no hematuria or hematochezia.  The patient denies any muscle aches or arthritis.  The patient denies any rash.  The patient denies frequent falling or instability.  There is no history of depression or  anxiety.  All other systems were reviewed and are negative.  PHYSICAL EXAM BP 140/80  Pulse 66  Ht 5\' 6"  (1.676 m)  Wt 185 lb (83.915 kg)  BMI 29.86 kg/m2 The patient is alert and oriented x 3.  The mood and affect are normal.  The skin is warm and dry.  Color is normal.  The HEENT exam reveals that the sclera are nonicteric.  The mucous membranes are moist.  The carotids are 2+ without bruits.  There is no thyromegaly.  There is no JVD.  The lungs are clear.  The chest wall is non tender.  The heart exam reveals a regular rate with a normal S1 and S2.  There is a soft aortic outflow murmur. There is no gallop.  The PMI is not displaced.   Abdominal exam reveals good bowel sounds.  There is no guarding or rebound.  There is no hepatosplenomegaly or tenderness.  There are no masses.  Exam of the legs reveal no clubbing, cyanosis, or edema.  The legs are without rashes.  The distal pulses are intact.  Cranial nerves II - XII are intact.  Motor and sensory functions are intact.  The gait is normal.  INR today is 2.3. ASSESSMENT AND PLAN

## 2011-03-23 NOTE — Assessment & Plan Note (Signed)
He is status post left sided maze procedure. He has had no recurrent atrial fibrillation. We will stop his Coumadin, digoxin, and amiodarone at this time.

## 2011-04-08 LAB — MAGNESIUM: Magnesium: 1.9 mg/dL (ref 1.5–2.5)

## 2011-04-08 LAB — COMPREHENSIVE METABOLIC PANEL
AST: 22 U/L (ref 0–37)
CO2: 28 mEq/L (ref 19–32)
Calcium: 9.2 mg/dL (ref 8.4–10.5)
Creatinine, Ser: 1.04 mg/dL (ref 0.4–1.5)
GFR calc Af Amer: >60 mL/min (ref >60)
GFR calc non Af Amer: >60 mL/min (ref >60)
Glucose, Bld: 202 mg/dL — ABNORMAL HIGH (ref 70–99)
Total Protein: 7.8 g/dL (ref 6.0–8.3)

## 2011-04-08 LAB — URINALYSIS, ROUTINE W REFLEX MICROSCOPIC
Hgb urine dipstick: NEGATIVE
Nitrite: NEGATIVE
Protein, ur: 30 mg/dL — AB
Specific Gravity, Urine: 1.027 (ref 1.005–1.030)
Urobilinogen, UA: 1 mg/dL (ref 0.0–1.0)

## 2011-04-08 LAB — BASIC METABOLIC PANEL
BUN: 13 mg/dL (ref 6–23)
Creatinine, Ser: 0.88 mg/dL (ref 0.4–1.5)
GFR calc non Af Amer: >60 mL/min (ref >60)
Glucose, Bld: 117 mg/dL — ABNORMAL HIGH (ref 70–99)

## 2011-04-08 LAB — CBC
MCHC: 33.6 g/dL (ref 30.0–36.0)
MCV: 86.8 fL (ref 78.0–100.0)
MCV: 86.8 fL (ref 78.0–100.0)
Platelets: 173 10*3/uL (ref 150–400)
RBC: 4.3 MIL/uL (ref 4.22–5.81)
RDW: 16.4 % — ABNORMAL HIGH (ref 11.5–15.5)
RDW: 16.9 % — ABNORMAL HIGH (ref 11.5–15.5)
WBC: 7.2 10*3/uL (ref 4.0–10.5)

## 2011-04-08 LAB — DIFFERENTIAL
Lymphocytes Relative: 24 % (ref 12–46)
Lymphs Abs: 1.3 10*3/uL (ref 0.7–4.0)
Neutrophils Relative %: 61 % (ref 43–77)

## 2011-04-08 LAB — URINE MICROSCOPIC-ADD ON

## 2011-04-08 LAB — GLUCOSE, CAPILLARY
Glucose-Capillary: 109 mg/dL — ABNORMAL HIGH (ref 70–99)
Glucose-Capillary: 119 mg/dL — ABNORMAL HIGH (ref 70–99)
Glucose-Capillary: 133 mg/dL — ABNORMAL HIGH (ref 70–99)

## 2011-04-08 LAB — PROTIME-INR
INR: 1.1 (ref 0.00–1.49)
Prothrombin Time: 14.7 seconds (ref 11.6–15.2)

## 2011-04-16 LAB — URINALYSIS, ROUTINE W REFLEX MICROSCOPIC
Bilirubin Urine: NEGATIVE
Nitrite: NEGATIVE
Specific Gravity, Urine: 1.005 (ref 1.005–1.030)
Urobilinogen, UA: 0.2 mg/dL (ref 0.0–1.0)
pH: 7 (ref 5.0–8.0)

## 2011-04-16 LAB — CBC
Hemoglobin: 13.4 g/dL (ref 13.0–17.0)
MCHC: 32.9 g/dL (ref 30.0–36.0)
RBC: 4.83 MIL/uL (ref 4.22–5.81)
WBC: 7.2 10*3/uL (ref 4.0–10.5)

## 2011-04-16 LAB — COMPREHENSIVE METABOLIC PANEL
Albumin: 4 g/dL (ref 3.5–5.2)
BUN: 14 mg/dL (ref 6–23)
Creatinine, Ser: 0.8 mg/dL (ref 0.4–1.5)
GFR calc Af Amer: >60 mL/min (ref >60)
Total Protein: 8.7 g/dL — ABNORMAL HIGH (ref 6.0–8.3)

## 2011-04-16 LAB — DIFFERENTIAL
Basophils Absolute: 0 10*3/uL (ref 0.0–0.1)
Lymphocytes Relative: 17 % (ref 12–46)
Monocytes Absolute: 0.6 10*3/uL (ref 0.1–1.0)
Monocytes Relative: 9 % (ref 3–12)
Neutro Abs: 5.3 10*3/uL (ref 1.7–7.7)

## 2011-04-16 LAB — APTT: aPTT: 32 seconds (ref 24–37)

## 2011-04-16 LAB — URINE CULTURE

## 2011-04-17 LAB — COMPREHENSIVE METABOLIC PANEL
Albumin: 3.2 g/dL — ABNORMAL LOW (ref 3.5–5.2)
Alkaline Phosphatase: 78 U/L (ref 39–117)
BUN: 12 mg/dL (ref 6–23)
Calcium: 8.7 mg/dL (ref 8.4–10.5)
Creatinine, Ser: 0.88 mg/dL (ref 0.4–1.5)
Potassium: 3.2 mEq/L — ABNORMAL LOW (ref 3.5–5.1)
Total Protein: 7.4 g/dL (ref 6.0–8.3)

## 2011-04-17 LAB — DIFFERENTIAL
Lymphocytes Relative: 2 % — ABNORMAL LOW (ref 12–46)
Lymphs Abs: 0.3 10*3/uL — ABNORMAL LOW (ref 0.7–4.0)
Monocytes Absolute: 1 10*3/uL (ref 0.1–1.0)
Monocytes Relative: 6 % (ref 3–12)
Neutro Abs: 16.3 10*3/uL — ABNORMAL HIGH (ref 1.7–7.7)

## 2011-04-17 LAB — CULTURE, BLOOD (ROUTINE X 2)
Culture: NO GROWTH
Culture: NO GROWTH
Report Status: 3052010

## 2011-04-17 LAB — CBC
HCT: 33.7 % — ABNORMAL LOW (ref 39.0–52.0)
MCHC: 33.9 g/dL (ref 30.0–36.0)
Platelets: 229 10*3/uL (ref 150–400)
RDW: 17.9 % — ABNORMAL HIGH (ref 11.5–15.5)

## 2011-04-17 LAB — ABO/RH: ABO/RH(D): O NEG

## 2011-04-17 LAB — TYPE AND SCREEN: ABO/RH(D): O NEG

## 2011-05-15 NOTE — Group Therapy Note (Signed)
[name] [name], [name] [name]               ACCOUNT NO.:  [id number]   MEDICAL RECORD NO.:  [id number]          PATIENT TYPE:  INP   LOCATION:  A319                          FACILITY:  [facility]   PHYSICIAN:  Edward L. [name], M.D.DATE OF BIRTH:  02/03/1936   DATE OF PROCEDURE:  DATE OF DISCHARGE:                                 PROGRESS NOTE   [name] [name] has been admitted with pneumonia.  He also has COPD.  He has  had some problems with blood sugar since he has been on steroids in the  hospital when sick and this morning he has had some problems with his  heart rate being up.  He is asymptomatic from that, but he is known to  have some sort of valvular heart disease for which we have not received  all of the information yet.  He says his breathing seems to be better.  He is happier with all that.  He is still coughing up some sputum and it  is still discolored.  He has no other new complaints.  He is not having  any chest pain.   His physical examination shows that he is awake and alert.  His vital  signs are as recorded.  His chest is clearing, but not clear.  His heart  is regular without gallop, but he does have a tachycardia.   My plan then is to continue with his medications and treatments with no  changes today except I am going to cut back on his nebulizer treatments  and I am going to give him an extra dose of Cardizem today.      Edward L. [name], M.D.  Electronically Signed     ELH/MEDQ  D:  03/03/2009  T:  03/04/2009  Job:  [id number]

## 2011-05-15 NOTE — Assessment & Plan Note (Signed)
OFFICE VISIT   LYMAN, BALINGIT  DOB:  1936-11-11                                        November 02, 2010  CHART #:  [id number]   HISTORY:  The patient returns to the office today in followup after his  replacement of his aortic valve, left-sided maze and ligation of left  atrial appendage done on August 29, 2010.  Subsequently, he developed  sternal dehiscence and required rewiring of his sternum on September 07, 2010.  His postoperative course was complicated by atrial flutter,  ultimately requiring being discharged home on Coumadin and also severe  ileus which slowly resolved, but after discharge to nursing facility,  required readmission temporarily to [facility] and ultimately  back home without any abdominal surgery.  Considering his postoperative  course, he comes to the office today in extremely good condition.  He  appears to be in sinus rhythm.  He notes his overall respiratory status  is markedly improved from his preop severe shortness of breath.  He  started in cardiac rehab several days ago and seems to be doing well at  this.   PHYSICAL EXAMINATION:  Today, his blood pressure 130/77, pulse is 70,  respiratory rate is 18, and O2 sats 94%, currently on room air.  He  still uses oxygen at night.  His sternum is stable and well healed  without any popping or clicking.  His valve sounds are crisp.  His rate  is regular.  He has no pedal edema.  His abdomen is soft and without  palpable masses or tenderness.   DIAGNOSTIC TESTS:  Followup chest x-ray shows clear lung fields  bilaterally without effusions.   MEDICATIONS:  He continues on:  1. Coumadin.  2. Lasix.  3. Allopurinol.  4. Diltiazem.  5. Potassium.  6. Glyburide.  7. Aspirin 325.  8. Lisinopril.  9. Iron supplementation.  10.Amiodarone 200 mg a day.   IMPRESSION:  Overall, I am very pleased with his progress especially  considering his high-risk nature of the surgery  because of severe  underlying pulmonary disease.  He currently continues in sinus rhythm.  We will leave it up to the discretion of Dr. Swaziland when to stop his  amiodarone and Coumadin.  He does have a tissue valve and so, long-term  anticoagulation is not mandatory.  He notes that he is to see Dr. Swaziland  back in 6 weeks.  I will plan to see him back in 3 months.  Overall, I  am very pleased with his progress.   Sheliah Plane, MD  Electronically Signed   EG/MEDQ  D:  11/02/2010  T:  11/03/2010  Job:  [id number]   cc:   Peter M. Swaziland, M.D.  Edward L. [name], M.D.

## 2011-05-15 NOTE — Group Therapy Note (Signed)
[name], [name]               ACCOUNT NO.:  [id number]   MEDICAL RECORD NO.:  [id number]          PATIENT TYPE:  INP   LOCATION:  A319                          FACILITY:  [facility]   PHYSICIAN:  Edward L. [name], M.D.DATE OF BIRTH:  03-24-1936   DATE OF PROCEDURE:  DATE OF DISCHARGE:                                 PROGRESS NOTE   PROBLEMS:  1. Pneumonia.  2. COPD.  3. Valvular heart disease.  4. Osteoarthritis with severe shoulder problems.   HISTORY:  [name] [name] is about the same.  He had a difficult night last  night, had received breathing treatment in the middle of the night.  He  is still short of breath, still congested.  He continues to have some  fever as well.  He is coughing up a little bit of sputum, but not a  great deal.  He has not had any cardiac issues.   PHYSICAL EXAMINATION:  VITAL SIGNS:  This morning, his temperature is  98.8, pulse is 94, respirations 18, blood pressure 139/75, and O2 sat is  92% on 2 L.  GENERAL:  He still looks a little bit dyspneic at rest.  CHEST:  Rales more marked on the right than on the left.  HEART:  Regular without gallop.  ABDOMEN:  Soft.  No masses are felt.  EXTREMITIES:  No edema.   ASSESSMENT:  He is overall I think about the same.   PLAN:  Continue with his antibiotics, change his nebulizers to every 4  hours through the night.  Add Mucinex and have him use an incentive  spirometry and see if we can get him feeling better.      Edward L. [name], M.D.  Electronically Signed     ELH/MEDQ  D:  03/01/2009  T:  03/01/2009  Job:  [id number]

## 2011-05-15 NOTE — Group Therapy Note (Signed)
NAMEODDIS, WESTLING               ACCOUNT NO.:  [id number]   MEDICAL RECORD NO.:  [id number]          PATIENT TYPE:  INP   LOCATION:  A319                          FACILITY:  [facility]   PHYSICIAN:  Edward L. [name], M.D.DATE OF BIRTH:  06-07-1936   DATE OF PROCEDURE:  DATE OF DISCHARGE:                                 PROGRESS NOTE   Mr. Milian is admitted with pneumonia.  He has had recent shoulder  surgery and seems to be doing a little bit better.  He has no new  complaints.  He said that he had a better night, last night than before.  He is coughing up some sputum.   PHYSICAL EXAMINATION:  VITALS:  This morning, shows that his temperature  is 97.9, pulse 104, respirations 18, blood pressure 133/75, O2 sats 96%  on 2 L.  CHEST:  Still with rales in the right base.  HEART:  Regular.   ASSESSMENT:  I think he is doing better.  He is taking fluids better.  So, I am going to go ahead and discontinue his IV fluids, switch him to  a saline lock.      Edward L. [name], M.D.  Electronically Signed     ELH/MEDQ  D:  03/02/2009  T:  03/02/2009  Job:  [id number]

## 2011-05-15 NOTE — Group Therapy Note (Signed)
NAMENEVAAN, BUNTON               ACCOUNT NO.:  [id number]   MEDICAL RECORD NO.:  [id number]          PATIENT TYPE:  INP   LOCATION:  A319                          FACILITY:  [facility]   PHYSICIAN:  Edward L. [name], M.D.DATE OF BIRTH:  Aug 19, 1936   DATE OF PROCEDURE:  DATE OF DISCHARGE:  03/07/2009                                 PROGRESS NOTE   Mr. Gassett is doing well and says that he had an excellent night last  night.  He has COPD.  He had diabetes, but at home he has not had to  take any medications for that.  He has pneumonia, which is improved.  He  has had cardiac arrhythmias, which are also better.  His temperature now  98.3, pulse 83, respirations 20, blood pressure 130/73, O2 sat is 95% on  2 L.  His chest is much clear and he looks more comfortable.   ASSESSMENT:  He is much better.  He has now had 2 excellent nights in a  row, I am going to send him home.  Please see discharge summary for  details.      Edward L. [name], M.D.  Electronically Signed     ELH/MEDQ  D:  03/07/2009  T:  03/07/2009  Job:  [id number]

## 2011-05-15 NOTE — Group Therapy Note (Signed)
[name] [name], [name] [name]               ACCOUNT NO.:  [id number]   MEDICAL RECORD NO.:  [id number]          PATIENT TYPE:  INP   LOCATION:  A319                          FACILITY:  [facility]   PHYSICIAN:  Edward L. [name], M.D.DATE OF BIRTH:  [birth year]   DATE OF PROCEDURE:  DATE OF DISCHARGE:                                 PROGRESS NOTE   [name] [name] was admitted with pneumonia.  He has had recent surgery on  his shoulder and has been doing a lot of shoulder exercises at home.  He  states that he feels a little bit better.  He is still coughing and  still congested.  His wife states that he was diagnosed with valvular  heart disease before he had a shoulder surgery done.   PHYSICAL EXAMINATION:  VITAL SIGNS:  Today, his temperature is 98.8,  pulse 92, respirations 20, blood pressure 129/76, and O2 sat is 97% on 2  L.  CHEST:  Clear.  HEART:  Regular.  ABDOMEN:  Soft.  EXTREMITIES:  He has what appears to be a well-healed surgical scar on  his left shoulder.   I do not have any other records from his cardiologist that he saw  regarding the valvular heart disease, so I need to get all that  information.  Otherwise, he seems to be doing better.   ASSESSMENT:  Then he has hyperglycemia and he has been on medications  for diabetes in the past.  He has gout, which is stable.  He has chronic  obstructive pulmonary disease, which is stable.  He has pneumonia, which  is improving, and he apparently has valvular heart disease.      Edward L. [name], M.D.  Electronically Signed     ELH/MEDQ  D:  02/28/2009  T:  02/28/2009  Job:  [id number]

## 2011-05-15 NOTE — Op Note (Signed)
NAME:  [name] [name], [name] [name]               ACCOUNT NO.:  [id number]   MEDICAL RECORD NO.:  [id number]          PATIENT TYPE:  OIB   LOCATION:  1427                         FACILITY:  [facility]   PHYSICIAN:  Georges Lynch. Gioffre, M.D.DATE OF BIRTH:  [birth year]   DATE OF PROCEDURE:  07/13/2009  DATE OF DISCHARGE:                               OPERATIVE REPORT   SURGEON:  Georges Lynch. Darrelyn Hillock, M.D.   ASSISTANT:  Rozell Searing, PA-C .   PREOPERATIVE DIAGNOSIS:  1. Complete tear of the right rotator cuff tendon, right shoulder.  2. Severe impingement, right shoulder.   POSTOPERATIVE DIAGNOSIS:  1. Complete tear of the right rotator cuff tendon, right shoulder.  2. Severe impingement, right shoulder.   OPERATION:  1. Open acromionectomy and acromioplasty, right shoulder.  2. Repair of a complete tear of the rotator cuff tendon, right      shoulder, utilizing a Tissue Mend graft and 1 Stryker anchor.   He had 1 gram of IV Ancef preop.   DESCRIPTION OF PROCEDURE:  Under general anesthesia, routine orthopedic  prepping and draping of the right shoulder was carried out.  Note, prior  to the surgery, he had an interscalene nerve block in the holding area  by Anesthesia.  Following that, an incision was made over the anterior  aspect of the right shoulder.  Bleeders were identified and cauterized.  At that time I stripped the deltoid tendon from the acromion in the  usual fashion.  I split the proximal part of the deltoid muscle.  I then  went down and identified a severe impingement-type problem with the  acromion.  I protected the underlying cuff and then utilized an  oscillating saw and bur to do a partial acromionectomy.  Following that,  I burred the lateral articular surface of the humerus.  I then  thoroughly irrigated out the shoulder and then did a primary suture  repair in side-to-side of the rotator cuff.  Following that, I  reinforced the repair with a Tissue Mend graft __________ .  I  utilized  1 Stryker anchor as well.  I thoroughly irrigated out the  area, reapproximated the deltoid tendon and muscle in the usual fashion.  I closed the subcu with 0 Vicryl and the skin with metal staples.  A  sterile Neosporin dressing was applied and he was placed in a large  shoulder immobilizer.  He will be maintained on Cardizem and aspirin and  postop will restart his Coumadin.           ______________________________  Georges Lynch. Darrelyn Hillock, M.D.     RAG/MEDQ  D:  07/13/2009  T:  07/13/2009  Job:  [id number]   cc:   Elmore Guise., M.D.  Fax: [phone]

## 2011-05-15 NOTE — Discharge Summary (Signed)
NAMEERICK, MURIN               ACCOUNT NO.:  [id number]   MEDICAL RECORD NO.:  [id number]          PATIENT TYPE:  INP   LOCATION:  A319                          FACILITY:  [facility]   PHYSICIAN:  Edward L. [name], M.D.DATE OF BIRTH:  [birth year]   DATE OF ADMISSION:  02/27/2009  DATE OF DISCHARGE:  LH                               DISCHARGE SUMMARY   FINAL DISCHARGE DIAGNOSES:  1. Pneumonia.  2. Chronic obstructive pulmonary disease.  3. Recent left rotator cuff repair.  4. Cardiac arrhythmias.  5. Valvular heart disease.  6. Osteoarthritis.  7. Borderline diabetes.  8. Hypokalemia.   Mr. [name] [name] is a 75 year old who came to the emergency room because of  cough and fever.  He has been bringing up brown sputum.  He has not had  any hemoptysis.  He does have a history of COPD and he did quit smoking  about 5 years ago.  He has severe problems with his shoulder, had a  recent left rotator cuff repair.  He has also apparently got some  valvular heart disease but I do not have all the information about that,  that is going to need repair.   PHYSICAL EXAMINATION:  On admission showed a well-developed, well-  nourished Caucasian male who is in no acute distress.  Temperature is  101.7, respirations 20, pulse 140, blood pressure 148/82.  CHEST:  Clear with some rales in the right base.  HEART:  Tachycardic without a murmur.  He had a reducible umbilical hernia.  Shoulder scar appeared to be  healing well.   His potassium was 3.2.  Chest x-ray showed right midlung infiltrate.   HOSPITAL COURSE:  He was started with Rocephin and Zithromax, had his  potassium replaced.  He had sliding scale NovoLog as needed.  There was  some question of a pulmonary nodule that seemed to have resolved by the  time of discharge.  He was given the antibiotics and improved, then had  some problems with cardiac arrhythmias and tachycardia again.  The  diltiazem was increased, he was cut back on his  albuterol, and then  improved enough for discharge by March 8.   He is going to be discharged home on:  1. Allopurinol 300 mg daily.  2. Phenergan 25 mg as needed for nausea.  3. Lasix 40 mg daily.  4. Diltiazem 90 mg, he had been taking it twice a day.  I am going to      increase it to four times a day for now.  5. Bayer aspirin 325 mg daily.  6. Potassium chloride, he is on 20 mEq and I will have him take two a      day since his potassium was slightly low.  7. Ceftin 500 mg b.i.d. times five more days.  8. Continue with his nebulizer treatments.  9. Continue with his O2 which is at night.  We are going to check his      O2 sat before he goes home and make sure that he does not need to      use oxygen during the  day as well.      Edward L. [name], M.D.  Electronically Signed     ELH/MEDQ  D:  03/07/2009  T:  03/07/2009  Job:  [id number]   cc:   Elmore Guise., M.D.  Fax: [phone]   Georges Lynch. Darrelyn Hillock, M.D.  Fax: [phone]

## 2011-05-15 NOTE — Group Therapy Note (Signed)
NAMEKEAN, GAUTREAU               ACCOUNT NO.:  [id number]   MEDICAL RECORD NO.:  [id number]          PATIENT TYPE:  INP   LOCATION:  A319                          FACILITY:  [facility]   PHYSICIAN:  Edward L. [name], M.D.DATE OF BIRTH:  [birth year]   DATE OF PROCEDURE:  03/05/2009  DATE OF DISCHARGE:                                 PROGRESS NOTE   Mr. Calise said that he had a bad night last night.  He is still  coughing and congested.  He did receive some Xopenex which did not seem  to cause too much trouble with his heart rate, but his heart rate has  still been up and I have increased his diltiazem.  He is coughing.  He  is coughing up some sputum.  He just does not feel as well as he would  like.  His chest x-ray did show that it was improved.  His physical  examination shows his chest has still some rhonchi.  His heart is  regular.  His vital signs as recorded.  As mentioned, his chest x-ray  has improved.   ASSESSMENT:  He has pneumonia.  He has chronic obstructive pulmonary  disease.  He has diabetes, but has not actually been requiring any  medications at home for diabetes.  He has valvular heart disease.  I  still have not received the records from his cardiologist.  My plan then  is to continue with his treatments.  I do not think there is really  anything to add and hopefully he will improve.      Edward L. [name], M.D.  Electronically Signed     ELH/MEDQ  D:  03/06/2009  T:  03/06/2009  Job:  [id number]

## 2011-05-15 NOTE — Group Therapy Note (Signed)
[name] [name], [name] [name]               ACCOUNT NO.:  [id number]   MEDICAL RECORD NO.:  [id number]         PATIENT TYPE:  PINP   LOCATION:  A319                          FACILITY:  [facility]   PHYSICIAN:  Edward L. [name], M.D.DATE OF BIRTH:  08/03/1936   DATE OF PROCEDURE:  DATE OF DISCHARGE:                                 PROGRESS NOTE   [name] [name] is, I think, a little better.  He is still, however, having  had heart rate problems yesterday and I stopped his nebulizer  treatments.  Now he states he is more short of breath.  He is clearly  not ready for discharge.   His physical examination shows that he is awake and alert.  Temperature  is 98.2, pulse is 84, respirations 20, blood pressure 144/83, and O2  sats 96% on 2 L.  His chest is still fairly clear.  His heart is regular  about 110 in sinus.   ASSESSMENT:  He continues to have trouble with his heart rhythm and he  is having some increased tolerance with shortness of breath now that he  is off the nebulizer treatment, so I am going to put him back on  nebulizer treatments at this time with Xopenex, add some Mucinex which I  added yesterday, get a chest x-ray today and follow.      Edward L. [name], M.D.  Electronically Signed     ELH/MEDQ  D:  03/04/2009  T:  03/04/2009  Job:  [id number]

## 2011-05-15 NOTE — Group Therapy Note (Signed)
[name] [name], [name] [name]               ACCOUNT NO.:  [id number]   MEDICAL RECORD NO.:  [id number]          PATIENT TYPE:  INP   LOCATION:  A319                          FACILITY:  [facility]   PHYSICIAN:  Edward L. [name], M.D.DATE OF BIRTH:  September 10, 1936   DATE OF PROCEDURE:  03/06/2009  DATE OF DISCHARGE:                                 PROGRESS NOTE   Mr. Gill said that he had a better night last night.  He is still  coughing and still short of breath, but he did not have as much trouble  as he did last night.  His physical examination shows that his chest is  pretty clear compared to previously.  He does not have a lot of rhonchi.  His heart is regular.  His abdomen is soft without masses.  Extremities  showed no edema.  His vital signs are as recorded.   ASSESSMENT:  He has pneumonia and that seems to be improving now.  He  has had cardiac arrhythmias which are better on an increased dose of  diltiazem.  He is on Xopenex as a nebulizer treatment.  His oxygenation  is marginal with 90% on room air.  He is 92-93% on oxygen, so the plan  is to continue with his IV antibiotics etc.  I think he will probably be  able to be discharged tomorrow, but he has had a fairly rocky course  with this.      Edward L. [name], M.D.  Electronically Signed     ELH/MEDQ  D:  03/06/2009  T:  03/07/2009  Job:  [id number]

## 2011-05-15 NOTE — H&P (Signed)
[name] [name], [name] [name]               ACCOUNT NO.:  [id number]   MEDICAL RECORD NO.:  [id number]          PATIENT TYPE:  INP   LOCATION:  A319                          FACILITY:  [facility]   PHYSICIAN:  Kingsley Callander. Ouida Sills, MD       DATE OF BIRTH:  [birth year]   DATE OF ADMISSION:  02/27/2009  DATE OF DISCHARGE:  LH                              HISTORY & PHYSICAL   CHIEF COMPLAINT:  Fever.   HISTORY OF PRESENT ILLNESS:  This patient is a 75 year old white male  with COPD who presented to the emergency room with a 1-day history of  fever and increased cough.  He has been coughing up brown sputum.  He  denies chest pain.  He has not had hemoptysis.  He quit smoking 5 years  ago.  He uses home oxygen and home nebulizer treatments.  He has never  been intubated.  He has not required hospitalization for respiratory  issues for about 5 years.  He is recovering from a recent left shoulder  operation.   PAST MEDICAL HISTORY:  1. Left shoulder surgery.  2. COPD.  3. Cholecystectomy.  4. Gout.   MEDICATIONS:  1. Diltiazem 90 mg b.i.d.  2. Furosemide 40 mg daily.  3. Celebrex 200 mg daily.  4. Flovent 110 2 puffs b.i.d.  5. Albuterol nebs q.i.d.  6. Oxygen 2 L.  7. Aspirin 325 mg daily.   ALLERGIES:  None.   SOCIAL HISTORY:  He does not smoke or drink.   FAMILY HISTORY:  His father had diabetes, mother had a stroke, sister  had kidney failure.   REVIEW OF SYSTEMS:  No chest pain, vomiting, change in bowel habits, or  difficulty voiding.   PHYSICAL EXAMINATION:  VITAL SIGNS:  Temperature 101.7, respirations 20,  pulse 140, blood pressure 148/82, oxygen saturation 97%.  GENERAL:  Alert and in no acute distress.  HEENT:  Eyes and oropharynx unremarkable.  NECK:  No JVD or thyromegaly.  LUNGS:  Right basilar rales.  HEART:  Tachycardiac with no audible murmurs.  ABDOMEN:  Soft, nontender.  No hepatosplenomegaly.  He has a reducible  umbilical hernia.  EXTREMITIES:  No cyanosis, clubbing, or  edema.  He has a well-healed  left shoulder scar.  He is currently holding his left arm in a sling.  NEURO:  No focal weakness.  LYMPH NODES:  No cervical or supraclavicular enlargement.   LABORATORY DATA:  Sodium 135, potassium 3.2, bicarb 30, BUN 12, glucose  212, creatinine 0.88, albumin 3.2, bilirubin 2.5, SGOT 21, alkaline  phosphatase 78.  The chest x-ray reveals a right midlung infiltrate.  He  has a 10-mm nodule in the right midlung which may be a nipple shadow.  White count 17.7, hemoglobin 11.4, MCV 82, platelets 229,000, 92 segs, 2  lymphs, 6 monos.   IMPRESSION:  1. Pneumonia and chronic obstructive pulmonary disease.  Blood and      sputum cultures are being obtained.  He will be treated with      Rocephin and azithromycin.  2. Tachycardia.  Electrocardiogram appears to show a sinus tachycardia  versus possibly an atrial flutter with a 2:1 conduction.  He will      be treated with diltiazem.  3. Status post recent left shoulder surgery.  4. Hypokalemia.  We will replace orally.  5. Gout.  We will continue allopurinol.  6. Hyperglycemia.  We will treat with sliding scale NovoLog.  7. Possible right lung nodule.  Followup with additional x-rays or      emission computed tomography per Dr. [name].  8. Mild normocytic anemia.      Kingsley Callander. Ouida Sills, MD  Electronically Signed     ROF/MEDQ  D:  02/27/2009  T:  02/27/2009  Job:  [id number]   cc:   Ramon Dredge L. [name], M.D.  Fax: [phone]

## 2011-05-15 NOTE — Op Note (Signed)
NAME:  [name] [name], [name] [name]               ACCOUNT NO.:  [id number]   MEDICAL RECORD NO.:  [id number]          PATIENT TYPE:  AMB   LOCATION:  DAY                          FACILITY:  [facility]   PHYSICIAN:  Georges Lynch. Gioffre, M.D.DATE OF BIRTH:  07-02-1936   DATE OF PROCEDURE:  02/02/2009  DATE OF DISCHARGE:                               OPERATIVE REPORT   SURGEON:  Georges Lynch. Darrelyn Hillock, M.D.   ASSISTANT:  Arlyn Leak PA   PREOPERATIVE DIAGNOSIS:  Complete tear of rotator cuff tendon on the  left.   POSTOPERATIVE DIAGNOSIS:  Complete tear of rotator cuff tendon on the  left.   OPERATION:  1. Partial acromionectomy and acromioplasty, left shoulder.  2. Repair of a complete tear of the rotator cuff left shoulder.  3. TissueMend graft, tendon graft with one PEEK anchor.   PROCEDURE:  Under general anesthesia routine orthopedic prep and drape  of left shoulder was carried out.  Prior to the general anesthetic he  had an interscalene nerve block.  After sterile prep and draping,  incision was made over the anterior aspect of left shoulder.  Bleeders  identified and cauterized.  I then went down and identified the deltoid  tendon and proximal deltoid muscle and by sharp dissection I separated  the tendon from the acromion.  Note he had a marked overgrowth of his  acromion with downsloping and it was very thick.  I protected the  remaining rotator cuff with the Bennett retractor utilizing oscillating  saw and bur to do an acromionectomy to reestablish the subacromial  space.  Once this was done, I thoroughly irrigated out the area and bone  waxed the undersurface of the acromion.  At this time I noticed he had a  horseshoe type tear in the rotator cuff.  The cuff was markedly thinned  out as well.  Following that, we then went on and utilized the bur to  bur down the lateral articular surface of the humerus.  PEEK anchor was  inserted into the proximal humerus.  I did a primary repair of the  tendon as much as we could down to the insertion.  I then utilized a  TissueMend graft to reinforce the repair.  The graft was about 2 x 3 cm.  Following that we attached the distal part of the graft into the distal  humerus and the proximal humerus with the remaining PEEK anchor sutures.  I then irrigated the wound out and then I inserted some thrombin-soaked  Gelfoam.  The  deltoid tendon muscle was reapproximated in usual fashion.  Subcu with 0  Vicryl, skin with metal staples.  Sterile Neosporin dressing was  applied.  We then utilized a shoulder immobilizer.     He did have 1 gram of IV Ancef preop.           ______________________________  Georges Lynch. Darrelyn Hillock, M.D.     RAG/MEDQ  D:  02/02/2009  T:  02/02/2009  Job:  [id number]   cc:   Elmore Guise., M.D.  Fax: [phone]

## 2011-07-13 ENCOUNTER — Encounter: Payer: Self-pay | Admitting: Cardiology

## 2011-07-13 ENCOUNTER — Encounter: Payer: Self-pay | Admitting: Nurse Practitioner

## 2011-08-31 ENCOUNTER — Other Ambulatory Visit: Payer: Self-pay | Admitting: *Deleted

## 2011-08-31 DIAGNOSIS — I10 Essential (primary) hypertension: Secondary | ICD-10-CM

## 2011-09-22 ENCOUNTER — Encounter: Payer: Self-pay | Admitting: Cardiology

## 2011-10-01 DIAGNOSIS — J189 Pneumonia, unspecified organism: Secondary | ICD-10-CM

## 2011-10-01 HISTORY — DX: Pneumonia, unspecified organism: J18.9

## 2011-10-03 LAB — POCT CARDIAC MARKERS
CKMB, poc: <1 — ABNORMAL LOW
Myoglobin, poc: 46.5
Troponin i, poc: <0.05

## 2011-10-10 ENCOUNTER — Other Ambulatory Visit (INDEPENDENT_AMBULATORY_CARE_PROVIDER_SITE_OTHER): Payer: PRIVATE HEALTH INSURANCE | Admitting: *Deleted

## 2011-10-10 DIAGNOSIS — I10 Essential (primary) hypertension: Secondary | ICD-10-CM

## 2011-10-10 LAB — HEPATIC FUNCTION PANEL
ALT: 12 U/L (ref 0–53)
AST: 15 U/L (ref 0–37)
Albumin: 4 g/dL (ref 3.5–5.2)
Alkaline Phosphatase: 67 U/L (ref 39–117)
Bilirubin, Direct: 0.4 mg/dL — ABNORMAL HIGH (ref 0.0–0.3)
Total Bilirubin: 2 mg/dL — ABNORMAL HIGH (ref 0.3–1.2)
Total Protein: 8.1 g/dL (ref 6.0–8.3)

## 2011-10-10 LAB — BASIC METABOLIC PANEL
Chloride: 104 mEq/L (ref 96–112)
GFR: 64.87 mL/min (ref >60.00)
Glucose, Bld: 121 mg/dL — ABNORMAL HIGH (ref 70–99)
Potassium: 4.1 mEq/L (ref 3.5–5.1)
Sodium: 142 mEq/L (ref 135–145)

## 2011-10-10 LAB — LIPID PANEL
Cholesterol: 144 mg/dL (ref 0–200)
VLDL: 12.6 mg/dL (ref 0.0–40.0)

## 2011-10-11 ENCOUNTER — Telehealth: Payer: Self-pay | Admitting: *Deleted

## 2011-10-11 NOTE — Telephone Encounter (Signed)
Notified wife of lab results. Has an appointment on Monday. Will send to Dr. [name].

## 2011-10-11 NOTE — Telephone Encounter (Signed)
Message copied by Lorayne Bender on Thu Oct 11, 2011  3:32 PM ------      Message from: [place], PETER M      Created: Wed Oct 10, 2011  4:51 PM       Lipids look OK. Chemistries show elevated bilirubin- cause unclear. Other chemistries are OK. CC: Dr. [name].      Theron Arista [place]

## 2011-10-12 ENCOUNTER — Emergency Department (HOSPITAL_COMMUNITY): Payer: Medicare Other

## 2011-10-12 ENCOUNTER — Encounter (HOSPITAL_COMMUNITY): Payer: Self-pay | Admitting: *Deleted

## 2011-10-12 ENCOUNTER — Inpatient Hospital Stay (HOSPITAL_COMMUNITY)
Admission: EM | Admit: 2011-10-12 | Discharge: 2011-10-17 | DRG: 186 | Disposition: A | Discharge status: Home Health | Payer: Medicare Other | Attending: Pulmonary Disease | Admitting: Pulmonary Disease

## 2011-10-12 ENCOUNTER — Ambulatory Visit: Payer: PRIVATE HEALTH INSURANCE | Admitting: Cardiology

## 2011-10-12 DIAGNOSIS — J441 Chronic obstructive pulmonary disease with (acute) exacerbation: Secondary | ICD-10-CM | POA: Diagnosis present

## 2011-10-12 DIAGNOSIS — N289 Disorder of kidney and ureter, unspecified: Secondary | ICD-10-CM | POA: Diagnosis present

## 2011-10-12 DIAGNOSIS — N183 Chronic kidney disease, stage 3 unspecified: Secondary | ICD-10-CM | POA: Diagnosis present

## 2011-10-12 DIAGNOSIS — J189 Pneumonia, unspecified organism: Secondary | ICD-10-CM | POA: Diagnosis present

## 2011-10-12 DIAGNOSIS — I359 Nonrheumatic aortic valve disorder, unspecified: Secondary | ICD-10-CM | POA: Diagnosis present

## 2011-10-12 DIAGNOSIS — I10 Essential (primary) hypertension: Secondary | ICD-10-CM | POA: Diagnosis present

## 2011-10-12 DIAGNOSIS — J90 Pleural effusion, not elsewhere classified: Principal | ICD-10-CM | POA: Diagnosis present

## 2011-10-12 DIAGNOSIS — J948 Other specified pleural conditions: Secondary | ICD-10-CM | POA: Diagnosis present

## 2011-10-12 LAB — DIFFERENTIAL
Basophils Absolute: 0 10*3/uL (ref 0.0–0.1)
Basophils Relative: 0 % (ref 0–1)
Eosinophils Relative: 1 % (ref 0–5)
Monocytes Absolute: 1 10*3/uL (ref 0.1–1.0)

## 2011-10-12 LAB — CBC
HCT: 41.8 % (ref 39.0–52.0)
MCHC: 31.6 g/dL (ref 30.0–36.0)
MCV: 92.5 fL (ref 78.0–100.0)
Platelets: 168 10*3/uL (ref 150–400)
RDW: 16.3 % — ABNORMAL HIGH (ref 11.5–15.5)

## 2011-10-12 LAB — COMPREHENSIVE METABOLIC PANEL
AST: 13 U/L (ref 0–37)
Albumin: 3.8 g/dL (ref 3.5–5.2)
Calcium: 9.2 mg/dL (ref 8.4–10.5)
Creatinine, Ser: 1 mg/dL (ref 0.50–1.35)
Sodium: 138 mEq/L (ref 135–145)
Total Protein: 8.4 g/dL — ABNORMAL HIGH (ref 6.0–8.3)

## 2011-10-12 LAB — MRSA PCR SCREENING: MRSA by PCR: NEGATIVE

## 2011-10-12 MED ORDER — SODIUM CHLORIDE 0.9 % IV SOLN
INTRAVENOUS | Status: DC
  Administered 2011-10-12 – 2011-10-15 (×7): via INTRAVENOUS

## 2011-10-12 MED ORDER — DEXTROSE 5 % IV SOLN
1.0000 g | INTRAVENOUS | Status: DC
  Administered 2011-10-13 – 2011-10-16 (×4): 1 g via INTRAVENOUS
  Filled 2011-10-12 (×6): qty 1

## 2011-10-12 MED ORDER — POTASSIUM CHLORIDE CRYS ER 20 MEQ PO TBCR
20.0000 meq | EXTENDED_RELEASE_TABLET | Freq: Every day | ORAL | Status: DC
  Administered 2011-10-13 – 2011-10-17 (×5): 20 meq via ORAL
  Filled 2011-10-12 (×5): qty 1

## 2011-10-12 MED ORDER — ALBUTEROL SULFATE (5 MG/ML) 0.5% IN NEBU
2.5000 mg | INHALATION_SOLUTION | RESPIRATORY_TRACT | Status: DC | PRN
  Administered 2011-10-14 – 2011-10-15 (×2): 2.5 mg via RESPIRATORY_TRACT
  Filled 2011-10-12 (×3): qty 0.5

## 2011-10-12 MED ORDER — PIPERACILLIN-TAZOBACTAM 3.375 G IVPB
3.3750 g | Freq: Once | INTRAVENOUS | Status: AC
  Administered 2011-10-12: 3.375 g via INTRAVENOUS
  Filled 2011-10-12: qty 50

## 2011-10-12 MED ORDER — IPRATROPIUM BROMIDE 0.02 % IN SOLN
0.5000 mg | Freq: Four times a day (QID) | RESPIRATORY_TRACT | Status: DC
  Administered 2011-10-12 – 2011-10-17 (×17): 0.5 mg via RESPIRATORY_TRACT
  Filled 2011-10-12 (×20): qty 2.5

## 2011-10-12 MED ORDER — METHYLPREDNISOLONE SODIUM SUCC 125 MG IJ SOLR
80.0000 mg | Freq: Three times a day (TID) | INTRAMUSCULAR | Status: DC
  Administered 2011-10-12: 80 mg via INTRAVENOUS
  Administered 2011-10-13 – 2011-10-17 (×13): 81.25 mg via INTRAVENOUS
  Filled 2011-10-12 (×15): qty 2

## 2011-10-12 MED ORDER — HYDROCODONE-ACETAMINOPHEN 5-325 MG PO TABS: 1.0000 | ORAL_TABLET | ORAL | Status: DC | PRN

## 2011-10-12 MED ORDER — ONDANSETRON HCL 4 MG PO TABS: 4.0000 mg | ORAL_TABLET | Freq: Four times a day (QID) | ORAL | Status: DC | PRN

## 2011-10-12 MED ORDER — ACETAMINOPHEN 325 MG PO TABS: 650.0000 mg | ORAL_TABLET | Freq: Four times a day (QID) | ORAL | Status: DC | PRN

## 2011-10-12 MED ORDER — MORPHINE SULFATE 2 MG/ML IJ SOLN: 2.0000 mg | INTRAMUSCULAR | Status: DC | PRN

## 2011-10-12 MED ORDER — ZOLPIDEM TARTRATE 5 MG PO TABS: 5.0000 mg | ORAL_TABLET | Freq: Every evening | ORAL | Status: DC | PRN

## 2011-10-12 MED ORDER — ALUM & MAG HYDROXIDE-SIMETH 200-200-20 MG/5ML PO SUSP: 30.0000 mL | Freq: Four times a day (QID) | ORAL | Status: DC | PRN

## 2011-10-12 MED ORDER — LISINOPRIL 10 MG PO TABS
10.0000 mg | ORAL_TABLET | Freq: Every day | ORAL | Status: DC
  Administered 2011-10-13 – 2011-10-17 (×5): 10 mg via ORAL
  Filled 2011-10-12: qty 4
  Filled 2011-10-12 (×4): qty 1

## 2011-10-12 MED ORDER — DILTIAZEM HCL ER 240 MG PO CP24
240.0000 mg | ORAL_CAPSULE | Freq: Every day | ORAL | Status: DC
  Administered 2011-10-13 – 2011-10-17 (×5): 240 mg via ORAL
  Filled 2011-10-12 (×12): qty 1

## 2011-10-12 MED ORDER — DEXTROSE 5 % IV SOLN
INTRAVENOUS | Status: AC
  Filled 2011-10-12: qty 500

## 2011-10-12 MED ORDER — ASPIRIN 81 MG PO CHEW
81.0000 mg | CHEWABLE_TABLET | Freq: Every day | ORAL | Status: DC
  Administered 2011-10-12 – 2011-10-17 (×6): 81 mg via ORAL
  Filled 2011-10-12 (×6): qty 1

## 2011-10-12 MED ORDER — ALBUTEROL SULFATE (5 MG/ML) 0.5% IN NEBU
2.5000 mg | INHALATION_SOLUTION | Freq: Four times a day (QID) | RESPIRATORY_TRACT | Status: DC
  Administered 2011-10-12 – 2011-10-17 (×18): 2.5 mg via RESPIRATORY_TRACT
  Filled 2011-10-12 (×20): qty 0.5

## 2011-10-12 MED ORDER — ALBUTEROL SULFATE (5 MG/ML) 0.5% IN NEBU
5.0000 mg | INHALATION_SOLUTION | Freq: Once | RESPIRATORY_TRACT | Status: AC
  Administered 2011-10-12: 5 mg via RESPIRATORY_TRACT
  Filled 2011-10-12: qty 1

## 2011-10-12 MED ORDER — ONDANSETRON HCL 4 MG/2ML IJ SOLN: 4.0000 mg | Freq: Four times a day (QID) | INTRAMUSCULAR | Status: DC | PRN

## 2011-10-12 MED ORDER — FUROSEMIDE 40 MG PO TABS
40.0000 mg | ORAL_TABLET | Freq: Every day | ORAL | Status: DC
  Administered 2011-10-12 – 2011-10-17 (×6): 40 mg via ORAL
  Filled 2011-10-12 (×6): qty 1

## 2011-10-12 MED ORDER — ENOXAPARIN SODIUM 40 MG/0.4ML ~~LOC~~ SOLN
40.0000 mg | SUBCUTANEOUS | Status: DC
  Administered 2011-10-12 – 2011-10-16 (×5): 40 mg via SUBCUTANEOUS
  Filled 2011-10-12 (×5): qty 0.4

## 2011-10-12 MED ORDER — FLUTICASONE PROPIONATE HFA 110 MCG/ACT IN AERO
2.0000 | INHALATION_SPRAY | Freq: Two times a day (BID) | RESPIRATORY_TRACT | Status: DC
  Administered 2011-10-12: 2 via RESPIRATORY_TRACT
  Filled 2011-10-12 (×2): qty 12

## 2011-10-12 MED ORDER — ALLOPURINOL 300 MG PO TABS
300.0000 mg | ORAL_TABLET | Freq: Every day | ORAL | Status: DC
  Administered 2011-10-13 – 2011-10-17 (×5): 300 mg via ORAL
  Filled 2011-10-12 (×5): qty 1

## 2011-10-12 MED ORDER — DEXTROSE 5 % IV SOLN
500.0000 mg | INTRAVENOUS | Status: DC
  Administered 2011-10-12 – 2011-10-16 (×5): 500 mg via INTRAVENOUS
  Filled 2011-10-12 (×6): qty 500

## 2011-10-12 MED ORDER — SODIUM CHLORIDE 0.9 % IJ SOLN
3.0000 mL | INTRAMUSCULAR | Status: DC | PRN
  Filled 2011-10-12: qty 3

## 2011-10-12 MED ORDER — PREDNISONE 20 MG PO TABS
60.0000 mg | ORAL_TABLET | Freq: Once | ORAL | Status: AC
  Administered 2011-10-12: 60 mg via ORAL
  Filled 2011-10-12: qty 3

## 2011-10-12 MED ORDER — VANCOMYCIN HCL IN DEXTROSE 1-5 GM/200ML-% IV SOLN
1000.0000 mg | Freq: Once | INTRAVENOUS | Status: AC
  Administered 2011-10-12: 1000 mg via INTRAVENOUS
  Filled 2011-10-12: qty 200

## 2011-10-12 MED ORDER — ACETAMINOPHEN 650 MG RE SUPP: 650.0000 mg | Freq: Four times a day (QID) | RECTAL | Status: DC | PRN

## 2011-10-12 MED ORDER — IPRATROPIUM BROMIDE 0.02 % IN SOLN
0.5000 mg | Freq: Once | RESPIRATORY_TRACT | Status: AC
  Administered 2011-10-12: 0.5 mg via RESPIRATORY_TRACT
  Filled 2011-10-12: qty 2.5

## 2011-10-12 MED ORDER — DEXTROSE 5 % IV SOLN
INTRAVENOUS | Status: AC
  Filled 2011-10-12: qty 1

## 2011-10-12 NOTE — ED Notes (Signed)
C/o SOA onset three days ago with gradual worsening---Denies chest pain---Lungs sounds diminished over all fields with intermittent exp. Wheeze--pulse ox 94-95% on room air.  He states he used home 02 until about three months ago and now is thinking he may need it again.

## 2011-10-12 NOTE — ED Notes (Signed)
Pt c/o "feeling bad" and feeling sob x 4 days

## 2011-10-12 NOTE — ED Notes (Signed)
Dr. [name] here to see pt.

## 2011-10-12 NOTE — ED Notes (Signed)
Neb Trts. In progress.

## 2011-10-12 NOTE — H&P (Signed)
[name] [name] MRN: [id number] DOB/AGE: [birth year] 73 y.o. Primary Care Physician:Travarus Trudo L, MD Admit date: 10/12/2011 Chief Complaint: Shortness of breath HPI: This is a 75 year old with a history of COPD in who had been doing fairly well until the last 3 or 4 days. He developed increasing shortness of breath cough and congestion and today got bad enough that he initially came to my office because his wife said he was very sick he was sent to the emergency room for evaluation. In the emergency room and was found to have a COPD exacerbation but also a small hemopneumothorax. He is being admitted for treatment of both  Past Medical History  Diagnosis Date  . COPD (chronic obstructive pulmonary disease)   . AF (atrial fibrillation)   . Hypertension   . Gout   . Arthritis   . Pneumonia     history of pneumonia  . Small bowel obstruction   . Renal insufficiency   . Aortic stenosis    Past Surgical History  Procedure Date  . Rotator cuff repair   . Cardiac valve replacement     aortic valve with a tissue prosthesis and left sided maze proc  . S/p rewiring of sternum for dehiscence   . Gallbladder surgery   . US echocardiography 01-03-11    EF 55-60%  . Cardiovascular stress test 01-26-09    EF 67%   He had a small bowel obstruction that did not require surgery     Family History  Problem Relation Age of Onset  . Hypertension Mother   . Stroke Mother   . Heart disease Sister   . Kidney disease Sister    No definite family history of COPD Social History:  reports that he quit smoking about 6 years ago. His smoking use included Cigarettes. He does not have any smokeless tobacco history on file. He reports that he does not drink alcohol or use illicit drugs. He lives at home with his wife  Allergies: No Known Allergies  Medications Prior to Admission  Medication Dose Route Frequency Provider Last Rate Last Dose  . 0.9 %  sodium chloride infusion   Intravenous Continuous  Flint Melter, MD 125 mL/hr at 10/12/11 1450    . albuterol (PROVENTIL) (5 MG/ML) 0.5% nebulizer solution 5 mg  5 mg Nebulization Once Flint Melter, MD   5 mg at 10/12/11 1246  . ipratropium (ATROVENT) nebulizer solution 0.5 mg  0.5 mg Nebulization Once Flint Melter, MD   0.5 mg at 10/12/11 1246  . piperacillin-tazobactam (ZOSYN) IVPB 3.375 g  3.375 g Intravenous Once Flint Melter, MD      . predniSONE (DELTASONE) tablet 60 mg  60 mg Oral Once Flint Melter, MD   60 mg at 10/12/11 1242  . vancomycin (VANCOCIN) IVPB 1000 mg/200 mL premix  1,000 mg Intravenous Once Flint Melter, MD       Medications Prior to Admission  Medication Sig Dispense Refill  . allopurinol (ZYLOPRIM) 300 MG tablet Take 300 mg by mouth daily.        Marland Kitchen aspirin 81 MG tablet Take 81 mg by mouth daily.        Marland Kitchen diltiazem (DILACOR XR) 240 MG 24 hr capsule Take 240 mg by mouth daily.        Marland Kitchen glyBURIDE (DIABETA) 2.5 MG tablet Take 2.5 mg by mouth daily.        Marland Kitchen lisinopril (PRINIVIL,ZESTRIL) 10 MG tablet Take 10 mg by mouth daily.        Marland Kitchen  potassium chloride SA (K-DUR,KLOR-CON) 20 MEQ tablet Take 20 mEq by mouth daily.        . furosemide (LASIX) 40 MG tablet Take 20 mg by mouth daily.             ZOX:WRUEA from the symptoms mentioned above,there are no other symptoms referable to all systems reviewed.  Physical Exam: Blood pressure 153/74, pulse 105, temperature 97.8 F (36.6 C), temperature source Oral, resp. rate 22, height 5\' 6"  (1.676 m), weight 81.647 kg (180 lb), SpO2 100.00%. He is awake and alert. He does not appear to be in acute distress. His HEENT exam shows his mucous membranes are slightly dry his neck is supple without masses. His chest shows rhonchi bilaterally. His heart is regular without murmur gallop or rub. His abdomen is soft without masses his bowel sounds present and active. Extremities showed no clubbing cyanosis or edema. His central nervous system examination is grossly  intact.  No results found for this or any previous visit (from the past 48 hour(s)). No results found for this or any previous visit (from the past 240 hour(s)).  Dg Chest 2 View  10/12/2011  *RADIOLOGY REPORT*  Clinical Data: Shortness of breath, smoking history, cardiac valve replaced 1 year ago  CHEST - 2 VIEW  Comparison: Portable chest x-ray of 11/17/2010  Findings:  There is a right hydropneumothorax present of approximately 15-20%.  There is also opacity within the right upper lobe most consistent with right upper lobe pneumonia.  The left lung is clear.  Mild cardiomegaly is stable.  Median sternotomy sutures are noted.  IMPRESSION:  1.  15-20% right hydropneumothorax. 2.  Right upper lobe pneumonia. 3.  COPD.  I called the results of this study to the scribe for Dr. Effie Shy in the emergency department at [facility] at 12:45 p.m. on 10/12/2011. Dr. Effie Shy was in the middle of an emergency procedure.  Original Report Authenticated By: Juline Patch, M.D.   Impression: He has COPD exacerbation and a hemopneumothorax Active Problems:  * No active hospital problems. *      Plan: He will be admitted for IV treatment incentive spirometry oxygen etc.      [name] 10/12/2011, 3:08 PM

## 2011-10-12 NOTE — ED Notes (Signed)
Sitting on carrier---no change in status--vital signs are stable.

## 2011-10-12 NOTE — ED Notes (Signed)
Blood cultures being drawn

## 2011-10-12 NOTE — ED Provider Notes (Signed)
History   This chart was scribed for Flint Melter, MD by Clarita Crane. The patient was seen in room APA10/AP[facility]10 and the patient's care was started at 12:06PM.   CSN: [id number] Arrival date & time: 10/12/2011 11:44 AM  Chief Complaint  Patient presents with  . Shortness of Breath    (Consider location/radiation/quality/duration/timing/severity/associated sxs/prior treatment) HPI [name] [name] is a 75 y.o. male who presents to the Emergency Department complaining of constant SOB onset 3 days ago [place] and persistent since. Denies fever, chest pain, productive cough, nausea, vomiting, diarrhea. Patient reports SOB is aggravated with exertion and relieved by nothing. Patient with h/o COPD, atrial fibrillation, hypertension, gout, pneumonia, renal insufficiency, aortic stenosis. Patient not currently on coumadin.  Past Medical History  Diagnosis Date  . COPD (chronic obstructive pulmonary disease)   . AF (atrial fibrillation)   . Hypertension   . Gout   . Arthritis   . Pneumonia     history of pneumonia  . Small bowel obstruction   . Renal insufficiency   . Aortic stenosis     Past Surgical History  Procedure Date  . Rotator cuff repair   . Cardiac valve replacement     aortic valve with a tissue prosthesis and left sided maze proc  . S/p rewiring of sternum for dehiscence   . Gallbladder surgery   . US echocardiography 01-03-11    EF 55-60%  . Cardiovascular stress test 01-26-09    EF 67%    Family History  Problem Relation Age of Onset  . Hypertension Mother   . Stroke Mother   . Heart disease Sister   . Kidney disease Sister     History  Substance Use Topics  . Smoking status: Former Smoker    Types: Cigarettes    Quit date: 01/18/2005  . Smokeless tobacco: Not on file  . Alcohol Use: No      Review of Systems 10 Systems reviewed and are negative for acute change except as noted in the HPI.  Allergies  Review of  patient's allergies indicates no known allergies.  Home Medications   Current Outpatient Rx  Name Route Sig Dispense Refill  . ALLOPURINOL 300 MG PO TABS Oral Take 300 mg by mouth daily.      . ASPIRIN 81 MG PO TABS Oral Take 81 mg by mouth daily.      Marland Kitchen DILTIAZEM HCL CR 240 MG PO CP24 Oral Take 240 mg by mouth daily.      . FUROSEMIDE 40 MG PO TABS Oral Take 0.5 tablets (20 mg total) by mouth daily. 30 tablet 0  . GLYBURIDE 2.5 MG PO TABS Oral Take 2.5 mg by mouth daily.      Marland Kitchen LISINOPRIL 10 MG PO TABS Oral Take 10 mg by mouth daily.      Marland Kitchen POTASSIUM CHLORIDE CRYS CR 20 MEQ PO TBCR Oral Take 20 mEq by mouth daily.        BP 153/74  Pulse 105  Temp(Src) 97.8 F (36.6 C) (Oral)  Resp 22  Ht 5\' 6"  (1.676 m)  Wt 180 lb (81.647 kg)  BMI 29.05 kg/m2  SpO2 100%  Physical Exam  Nursing note and vitals reviewed. Constitutional: He is oriented to person, place, and time. He appears well-developed and well-nourished. No distress.  HENT:  Head: Normocephalic and atraumatic.  Mouth/Throat: Oropharynx is clear and moist.       Minor abrasion to right  side of face.   Eyes: EOM are normal. Pupils are equal, round, and reactive to light.  Neck: Neck supple. No tracheal deviation present.  Cardiovascular: Normal rate, regular rhythm and normal heart sounds.  Exam reveals no friction rub.   No murmur heard. Pulmonary/Chest: Effort normal. No respiratory distress. He has wheezes. He has no rales.       Generalized decreased air movement and expiratory wheezing. No rhonchi.   Abdominal: Soft. He exhibits no distension. There is no tenderness.  Musculoskeletal: Normal range of motion. He exhibits no edema and no tenderness.       Entire spine non-tender.   Lymphadenopathy:    He has no cervical adenopathy.  Neurological: He is alert and oriented to person, place, and time. No sensory deficit.       Strength normal and equal of bilateral lower extremities.  Skin: Skin is warm and dry.    Psychiatric: He has a normal mood and affect. His behavior is normal.    ED Course  Procedures (including critical care time)  CRITICAL CARE NOTE: Critical care time was provided for 30 minutes exclusive of separately billable procedures and treating other patients.  This was necessary to treat or prevent further deterioration of the following condition(s) New onset Pneumothorax, Pneumonia, and COPD exacerbation which the patient had and/or had a high probability of suddenly developing. This involved direct bedside patient care, speaking with family members, review of past medical records, reviewing the results of the laboratory and diagnostic studies, consulting with other physicians, as well as evaluating the effectiveness of the therapy instituted as described.  DIAGNOSTIC STUDIES: Oxygen Saturation is 96% on room air, normal by my interpretation.    COORDINATION OF CARE: 12:45PM- Dr. Effie Shy informed of chest x-ray results by radiologist who confirms patient with right hydropneumothorax at 15% with an additional right upper lobe pneumonia.  12:50PM- Patient and family member informed of chest x-ray results and probable need for admission.  1:41PM- Consult complete with Dr. [name]. Patient case explained and discussed. Dr. [name] agrees to evaluate patient.   Labs Reviewed - No data to display Dg Chest 2 View  10/12/2011  *RADIOLOGY REPORT*  Clinical Data: Shortness of breath, smoking history, cardiac valve replaced 1 year ago  CHEST - 2 VIEW  Comparison: Portable chest x-ray of 11/17/2010  Findings:  There is a right hydropneumothorax present of approximately 15-20%.  There is also opacity within the right upper lobe most consistent with right upper lobe pneumonia.  The left lung is clear.  Mild cardiomegaly is stable.  Median sternotomy sutures are noted.  IMPRESSION:  1.  15-20% right hydropneumothorax. 2.  Right upper lobe pneumonia. 3.  COPD.  I called the results of this study to the  scribe for Dr. Effie Shy in the emergency department at [facility] at 12:45 p.m. on 10/12/2011. Dr. Effie Shy was in the middle of an emergency procedure.  Original Report Authenticated By: Juline Patch, M.D.     No diagnosis found.    MDM  Unstable pt requiring admission to the ICU for CAP, PTX, bronchospasm.      I personally performed the services described in this documentation, which was scribed in my presence. The recorded information has been reviewed and considered.     Flint Melter, MD 10/12/11 1729

## 2011-10-13 ENCOUNTER — Inpatient Hospital Stay (HOSPITAL_COMMUNITY): Payer: Medicare Other

## 2011-10-13 DIAGNOSIS — J948 Other specified pleural conditions: Secondary | ICD-10-CM | POA: Diagnosis present

## 2011-10-13 LAB — CBC
HCT: 35.2 % — ABNORMAL LOW (ref 39.0–52.0)
Hemoglobin: 11.5 g/dL — ABNORMAL LOW (ref 13.0–17.0)
MCH: 29.9 pg (ref 26.0–34.0)
RBC: 3.84 MIL/uL — ABNORMAL LOW (ref 4.22–5.81)

## 2011-10-13 LAB — BASIC METABOLIC PANEL
CO2: 27 mEq/L (ref 19–32)
Chloride: 100 mEq/L (ref 96–112)
GFR calc non Af Amer: 55 mL/min — ABNORMAL LOW (ref >90)
Glucose, Bld: 287 mg/dL — ABNORMAL HIGH (ref 70–99)
Potassium: 4.2 mEq/L (ref 3.5–5.1)
Sodium: 136 mEq/L (ref 135–145)

## 2011-10-13 MED ORDER — FLUTICASONE PROPIONATE HFA 110 MCG/ACT IN AERO
2.0000 | INHALATION_SPRAY | Freq: Two times a day (BID) | RESPIRATORY_TRACT | Status: DC
  Administered 2011-10-13 – 2011-10-17 (×9): 2 via RESPIRATORY_TRACT
  Filled 2011-10-13: qty 12

## 2011-10-13 NOTE — Progress Notes (Signed)
Report called to Dagoberto Ligas, RN on dept 300.

## 2011-10-13 NOTE — Progress Notes (Signed)
Subjective: He says he feels better. He has no new complaints. His breathing is doing fairly well he says he still has some pain in his chest he is coughing some but not coughing anything up. He has no other new complaints.  Objective: Vital signs in last 24 hours: Temp:  [97.8 F (36.6 C)-99 F (37.2 C)] 97.9 F (36.6 C) (10/13 0000) Pulse Rate:  [64-105] 81  (10/13 0735) Resp:  [14-22] 15  (10/13 0735) BP: (106-153)/(57-85) 132/74 mmHg (10/13 0735) SpO2:  [94 %-100 %] 97 % (10/13 0735) Weight:  [81.647 kg (180 lb)] 180 lb (81.647 kg) (10/12 1141) Weight change:  Last BM Date: 10/12/11  Intake/Output from previous day: 10/12 0701 - 10/13 0700 In: 2490 [P.O.:240; I.V.:2000; IV Piggyback:250] Out: 400 [Urine:400]  PHYSICAL EXAM General appearance: alert, cooperative and mild distress Resp: rhonchi bilaterally Cardio: regular rate and rhythm, S1, S2 normal, no murmur, click, rub or gallop GI: soft, non-tender; bowel sounds normal; no masses,  no organomegaly Extremities: extremities normal, atraumatic, no cyanosis or edema  Lab Results:    Basic Metabolic Panel:  Basename 10/13/11 0436 10/12/11 1445  NA 136 138  K 4.2 4.1  CL 100 98  CO2 27 28  GLUCOSE 287* 148*  BUN 25* 18  CREATININE 1.25 1.00  CALCIUM 8.3* 9.2  MG -- --  PHOS -- --   Liver Function Tests:  Basename 10/12/11 1445  AST 13  ALT 10  ALKPHOS 76  BILITOT 1.3*  PROT 8.4*  ALBUMIN 3.8   No results found for this basename: LIPASE:2,AMYLASE:2 in the last 72 hours No results found for this basename: AMMONIA:2 in the last 72 hours CBC:  Basename 10/13/11 0436 10/12/11 1445  WBC 5.1 8.6  NEUTROABS -- 6.4  HGB 11.5* 13.2  HCT 35.2* 41.8  MCV 91.7 92.5  PLT 142* 168   Cardiac Enzymes: No results found for this basename: CKTOTAL:3,CKMB:3,CKMBINDEX:3,TROPONINI:3 in the last 72 hours BNP: No results found for this basename: POCBNP:3 in the last 72 hours D-Dimer: No results found for this  basename: DDIMER:2 in the last 72 hours CBG: No results found for this basename: GLUCAP:6 in the last 72 hours Hemoglobin A1C: No results found for this basename: HGBA1C in the last 72 hours Fasting Lipid Panel: No results found for this basename: CHOL,HDL,LDLCALC,TRIG,CHOLHDL,LDLDIRECT in the last 72 hours Thyroid Function Tests: No results found for this basename: TSH,T4TOTAL,FREET4,T3FREE,THYROIDAB in the last 72 hours Anemia Panel: No results found for this basename: VITAMINB12,FOLATE,FERRITIN,TIBC,IRON,RETICCTPCT in the last 72 hours Urine Drug Screen:  Alcohol Level: No results found for this basename: ETH:2 in the last 72 hours Urinalysis:  Misc. Labs:  ABGS No results found for this basename: PHART,PCO2,PO2ART,TCO2,HCO3 in the last 72 hours CULTURES Recent Results (from the past 240 hour(s))  CULTURE, BLOOD (ROUTINE X 2)     Status: Normal (Preliminary result)   Collection Time   10/12/11  2:46 PM      Component Value Range Status Comment   Specimen Description BLOOD BLOOD LEFT ARM   Final    Special Requests BOTTLES DRAWN AEROBIC AND ANAEROBIC 4CC EACH   Final    Culture PENDING   Incomplete    Report Status PENDING   Incomplete   CULTURE, BLOOD (ROUTINE X 2)     Status: Normal (Preliminary result)   Collection Time   10/12/11  2:56 PM      Component Value Range Status Comment   Specimen Description BLOOD BLOOD RIGHT HAND   Final  Special Requests BOTTLES DRAWN AEROBIC AND ANAEROBIC 4CC EACH   Final    Culture PENDING   Incomplete    Report Status PENDING   Incomplete   MRSA PCR SCREENING     Status: Normal   Collection Time   10/12/11  6:27 PM      Component Value Range Status Comment   MRSA by PCR NEGATIVE  NEGATIVE  Final    Studies/Results: Dg Chest 2 View  10/12/2011  *RADIOLOGY REPORT*  Clinical Data: Shortness of breath, smoking history, cardiac valve replaced 1 year ago  CHEST - 2 VIEW  Comparison: Portable chest x-ray of 11/17/2010  Findings:  There  is a right hydropneumothorax present of approximately 15-20%.  There is also opacity within the right upper lobe most consistent with right upper lobe pneumonia.  The left lung is clear.  Mild cardiomegaly is stable.  Median sternotomy sutures are noted.  IMPRESSION:  1.  15-20% right hydropneumothorax. 2.  Right upper lobe pneumonia. 3.  COPD.  I called the results of this study to the scribe for Dr. Effie Shy in the emergency department at [facility] at 12:45 p.m. on 10/12/2011. Dr. Effie Shy was in the middle of an emergency procedure.  Original Report Authenticated By: Juline Patch, M.D.   Dg Chest Portable 1 View  10/13/2011  *RADIOLOGY REPORT*  Clinical Data: Follow up pneumothorax  PORTABLE CHEST - 1 VIEW  Comparison: 10/12/2011  Findings: The patient is status post median sternotomy.  Heart size is enlarged.  Again noted is 15 20% right pneumothorax.  This is unchanged from previous exam.  Airspace consolidation within the right upper lobe and right base is stable.  No new findings.  IMPRESSION:  1.  No change in the right pneumothorax.  Original Report Authenticated By: Rosealee Albee, M.D.    Medications:  Prior to Admission:  Prescriptions prior to admission  Medication Sig Dispense Refill  . albuterol (PROVENTIL) (5 MG/ML) 0.5% nebulizer solution Take 2.5 mg by nebulization 4 (four) times daily.        Marland Kitchen allopurinol (ZYLOPRIM) 300 MG tablet Take 300 mg by mouth daily.        Marland Kitchen aspirin 81 MG tablet Take 81 mg by mouth daily.        Marland Kitchen diltiazem (DILACOR XR) 240 MG 24 hr capsule Take 240 mg by mouth daily.        . fluticasone (FLOVENT HFA) 110 MCG/ACT inhaler Inhale 2 puffs into the lungs 2 (two) times daily.        . furosemide (LASIX) 40 MG tablet Take 40 mg by mouth daily.        Marland Kitchen glyBURIDE (DIABETA) 2.5 MG tablet Take 2.5 mg by mouth daily.        Marland Kitchen lisinopril (PRINIVIL,ZESTRIL) 10 MG tablet Take 10 mg by mouth daily.        . potassium chloride SA (K-DUR,KLOR-CON) 20 MEQ tablet  Take 20 mEq by mouth daily.        . furosemide (LASIX) 40 MG tablet Take 20 mg by mouth daily.         Scheduled:   . albuterol  2.5 mg Nebulization Q6H  . albuterol  5 mg Nebulization Once  . allopurinol  300 mg Oral Daily  . aspirin  81 mg Oral Daily  . azithromycin  500 mg Intravenous Q24H  . cefTRIAXone (ROCEPHIN) IV  1 g Intravenous Q24H  . diltiazem  240 mg Oral Daily  . enoxaparin  40 mg Subcutaneous Q24H  . fluticasone  2 puff Inhalation BID  . furosemide  40 mg Oral Daily  . ipratropium  0.5 mg Nebulization Once  . ipratropium  0.5 mg Nebulization Q6H  . lisinopril  10 mg Oral Daily  . methylPREDNISolone sodium succinate  81.25 mg Intravenous Q8H  . piperacillin-tazobactam (ZOSYN)  IV  3.375 g Intravenous Once  . potassium chloride SA  20 mEq Oral Daily  . predniSONE  60 mg Oral Once  . vancomycin  1,000 mg Intravenous Once  . DISCONTD: fluticasone  2 puff Inhalation BID   Continuous:   . sodium chloride 125 mL/hr at 10/13/11 0248   WGN:[id number], acetaminophen, albuterol, alum & mag hydroxide-simeth, HYDROcodone-acetaminophen, morphine, ondansetron (ZOFRAN) IV, ondansetron, sodium chloride, zolpidem  Assesment: He has pneumonia and also has a pneumothorax. His family tells the that there was some question about his liver function testing are when he had outpatient lab work about a week ago. He has a history of the valve replacement, COPD diabetes Active Problems:  * No active hospital problems. *     Plan: He seems to be improving so I'm not going to change his medications or treatments right now.    LOS: 1 day   Barba Solt L 10/13/2011, 9:39 AM

## 2011-10-14 MED ORDER — SODIUM CHLORIDE 0.9 % IN NEBU
INHALATION_SOLUTION | RESPIRATORY_TRACT | Status: AC
  Administered 2011-10-14: 3 mL
  Filled 2011-10-14: qty 3

## 2011-10-14 NOTE — Progress Notes (Signed)
Subjective: He says he feels much better. He is still congested. He has no other new complaints.  Objective: Vital signs in last 24 hours: Temp:  [97.5 F (36.4 C)-98.1 F (36.7 C)] 97.5 F (36.4 C) (10/14 0556) Pulse Rate:  [79-104] 87  (10/14 0556) Resp:  [19-23] 20  (10/14 0556) BP: (105-147)/(58-77) 138/75 mmHg (10/14 0556) SpO2:  [94 %-97 %] 96 % (10/14 0728) Weight:  [81.647 kg (180 lb)] 180 lb (81.647 kg) (10/13 1700) Weight change: -0.001 kg (-0 oz) Last BM Date: 10/13/11  Intake/Output from previous day: 10/13 0701 - 10/14 0700 In: 600 [P.O.:600] Out: 900 [Urine:900]  PHYSICAL EXAM General appearance: alert, cooperative and mild distress Resp: clear to auscultation bilaterally Cardio: regular rate and rhythm, S1, S2 normal, no murmur, click, rub or gallop GI: soft, non-tender; bowel sounds normal; no masses,  no organomegaly Extremities: extremities normal, atraumatic, no cyanosis or edema  Lab Results:    Basic Metabolic Panel:  Basename 10/13/11 0436 10/12/11 1445  NA 136 138  K 4.2 4.1  CL 100 98  CO2 27 28  GLUCOSE 287* 148*  BUN 25* 18  CREATININE 1.25 1.00  CALCIUM 8.3* 9.2  MG -- --  PHOS -- --   Liver Function Tests:  Basename 10/12/11 1445  AST 13  ALT 10  ALKPHOS 76  BILITOT 1.3*  PROT 8.4*  ALBUMIN 3.8   No results found for this basename: LIPASE:2,AMYLASE:2 in the last 72 hours No results found for this basename: AMMONIA:2 in the last 72 hours CBC:  Basename 10/13/11 0436 10/12/11 1445  WBC 5.1 8.6  NEUTROABS -- 6.4  HGB 11.5* 13.2  HCT 35.2* 41.8  MCV 91.7 92.5  PLT 142* 168   Cardiac Enzymes: No results found for this basename: CKTOTAL:3,CKMB:3,CKMBINDEX:3,TROPONINI:3 in the last 72 hours BNP: No results found for this basename: POCBNP:3 in the last 72 hours D-Dimer: No results found for this basename: DDIMER:2 in the last 72 hours CBG: No results found for this basename: GLUCAP:6 in the last 72 hours Hemoglobin  A1C: No results found for this basename: HGBA1C in the last 72 hours Fasting Lipid Panel: No results found for this basename: CHOL,HDL,LDLCALC,TRIG,CHOLHDL,LDLDIRECT in the last 72 hours Thyroid Function Tests: No results found for this basename: TSH,T4TOTAL,FREET4,T3FREE,THYROIDAB in the last 72 hours Anemia Panel: No results found for this basename: VITAMINB12,FOLATE,FERRITIN,TIBC,IRON,RETICCTPCT in the last 72 hours Urine Drug Screen:  Alcohol Level: No results found for this basename: ETH:2 in the last 72 hours Urinalysis:  Misc. Labs:  ABGS No results found for this basename: PHART,PCO2,PO2ART,TCO2,HCO3 in the last 72 hours CULTURES Recent Results (from the past 240 hour(s))  CULTURE, BLOOD (ROUTINE X 2)     Status: Normal (Preliminary result)   Collection Time   10/12/11  2:46 PM      Component Value Range Status Comment   Specimen Description BLOOD BLOOD LEFT ARM   Final    Special Requests BOTTLES DRAWN AEROBIC AND ANAEROBIC 4CC EACH   Final    Culture NO GROWTH 2 DAYS   Final    Report Status PENDING   Incomplete   CULTURE, BLOOD (ROUTINE X 2)     Status: Normal (Preliminary result)   Collection Time   10/12/11  2:56 PM      Component Value Range Status Comment   Specimen Description BLOOD BLOOD RIGHT HAND   Final    Special Requests BOTTLES DRAWN AEROBIC AND ANAEROBIC 4CC EACH   Final    Culture NO GROWTH  2 DAYS   Final    Report Status PENDING   Incomplete   MRSA PCR SCREENING     Status: Normal   Collection Time   10/12/11  6:27 PM      Component Value Range Status Comment   MRSA by PCR NEGATIVE  NEGATIVE  Final    Studies/Results: Dg Chest 2 View  10/12/2011  *RADIOLOGY REPORT*  Clinical Data: Shortness of breath, smoking history, cardiac valve replaced 1 year ago  CHEST - 2 VIEW  Comparison: Portable chest x-ray of 11/17/2010  Findings:  There is a right hydropneumothorax present of approximately 15-20%.  There is also opacity within the right upper lobe  most consistent with right upper lobe pneumonia.  The left lung is clear.  Mild cardiomegaly is stable.  Median sternotomy sutures are noted.  IMPRESSION:  1.  15-20% right hydropneumothorax. 2.  Right upper lobe pneumonia. 3.  COPD.  I called the results of this study to the scribe for Dr. Effie Shy in the emergency department at [facility] at 12:45 p.m. on 10/12/2011. Dr. Effie Shy was in the middle of an emergency procedure.  Original Report Authenticated By: Juline Patch, M.D.   Dg Chest Portable 1 View  10/13/2011  *RADIOLOGY REPORT*  Clinical Data: Follow up pneumothorax  PORTABLE CHEST - 1 VIEW  Comparison: 10/12/2011  Findings: The patient is status post median sternotomy.  Heart size is enlarged.  Again noted is 15 20% right pneumothorax.  This is unchanged from previous exam.  Airspace consolidation within the right upper lobe and right base is stable.  No new findings.  IMPRESSION:  1.  No change in the right pneumothorax.  Original Report Authenticated By: Rosealee Albee, M.D.    Medications:  Prior to Admission:  Prescriptions prior to admission  Medication Sig Dispense Refill  . albuterol (PROVENTIL) (5 MG/ML) 0.5% nebulizer solution Take 2.5 mg by nebulization 4 (four) times daily.        Marland Kitchen allopurinol (ZYLOPRIM) 300 MG tablet Take 300 mg by mouth daily.        Marland Kitchen aspirin 81 MG tablet Take 81 mg by mouth daily.        Marland Kitchen diltiazem (DILACOR XR) 240 MG 24 hr capsule Take 240 mg by mouth daily.        . fluticasone (FLOVENT HFA) 110 MCG/ACT inhaler Inhale 2 puffs into the lungs 2 (two) times daily.        . furosemide (LASIX) 40 MG tablet Take 40 mg by mouth daily.        Marland Kitchen glyBURIDE (DIABETA) 2.5 MG tablet Take 2.5 mg by mouth daily.        Marland Kitchen lisinopril (PRINIVIL,ZESTRIL) 10 MG tablet Take 10 mg by mouth daily.        . potassium chloride SA (K-DUR,KLOR-CON) 20 MEQ tablet Take 20 mEq by mouth daily.        . furosemide (LASIX) 40 MG tablet Take 20 mg by mouth daily.          Scheduled:   . albuterol  2.5 mg Nebulization Q6H  . allopurinol  300 mg Oral Daily  . aspirin  81 mg Oral Daily  . azithromycin  500 mg Intravenous Q24H  . cefTRIAXone (ROCEPHIN) IV  1 g Intravenous Q24H  . diltiazem  240 mg Oral Daily  . enoxaparin  40 mg Subcutaneous Q24H  . fluticasone  2 puff Inhalation BID  . furosemide  40 mg Oral Daily  .  ipratropium  0.5 mg Nebulization Q6H  . lisinopril  10 mg Oral Daily  . methylPREDNISolone sodium succinate  81.25 mg Intravenous Q8H  . potassium chloride SA  20 mEq Oral Daily   Continuous:   . sodium chloride 125 mL/hr at 10/14/11 0444   WUJ:[id number], acetaminophen, albuterol, alum & mag hydroxide-simeth, HYDROcodone-acetaminophen, morphine, ondansetron (ZOFRAN) IV, ondansetron, sodium chloride, zolpidem  Assesment: He has hydropneumothorax and pneumonia. He has some chronic renal insufficiency. He has COPD. His valvular heart disease with repair he is overall improved but he is not ready for discharge Principal Problem:  *Hydropneumothorax Active Problems:  Aortic valve disorders  COPD  Hypertension  Pneumonia  Renal insufficiency    Plan: He will have chest x-ray tomorrow continue with his IV antibiotics etc.    LOS: 2 days   Shyniece Scripter L 10/14/2011, 8:35 AM

## 2011-10-15 ENCOUNTER — Inpatient Hospital Stay (HOSPITAL_COMMUNITY): Payer: Medicare Other

## 2011-10-15 ENCOUNTER — Other Ambulatory Visit (HOSPITAL_COMMUNITY): Payer: Medicare Other

## 2011-10-15 ENCOUNTER — Ambulatory Visit: Payer: PRIVATE HEALTH INSURANCE | Admitting: Cardiology

## 2011-10-15 MED ORDER — SODIUM CHLORIDE 0.9 % IJ SOLN: 3.0000 mL | INTRAMUSCULAR | Status: DC | PRN

## 2011-10-15 MED ORDER — SODIUM CHLORIDE 0.9 % IV SOLN
250.0000 mL | INTRAVENOUS | Status: DC
  Administered 2011-10-15: 250 mL via INTRAVENOUS

## 2011-10-15 MED ORDER — SODIUM CHLORIDE 0.9 % IJ SOLN
3.0000 mL | Freq: Two times a day (BID) | INTRAMUSCULAR | Status: DC
  Administered 2011-10-15 – 2011-10-16 (×3): 3 mL via INTRAVENOUS
  Filled 2011-10-15 (×3): qty 3

## 2011-10-15 NOTE — Progress Notes (Signed)
The patient was given his Flovent MDI with a spacer during the 1900 hour following his HHN nebulizer treatment. Patient called the nurses around 2300 to complain of shortness of breath. Patient was followed up with and evaluated and given a breathing treatment to help with his shortness of breath. Patient went on to complain that he need his Flovent and asked for it again. I explained to him he had it and would not receive it again until later in the morning. He complained that due to the spacer he did not get all the medicine even though the he pumped the MDI several times against my advisement. Patient continued to complain to several staff member through the night that he needed his Flovent and I explained along with his nurse that he had the Flovent already.

## 2011-10-15 NOTE — Progress Notes (Signed)
Subjective: He says he feels better. He has no new complaints. He says he is approaching his baseline as far as his shortness of breath is concerned  Objective: Vital signs in last 24 hours: Temp:  [97.7 F (36.5 C)-98.2 F (36.8 C)] 98 F (36.7 C) (10/15 0600) Pulse Rate:  [82-89] 89  (10/15 0600) Resp:  [20-21] 20  (10/15 0600) BP: (127-156)/(74-87) 156/87 mmHg (10/15 0600) SpO2:  [94 %-98 %] 98 % (10/15 0724) Weight:  [89.6 kg (197 lb 8.5 oz)] 197 lb 8.5 oz (89.6 kg) (10/15 0600) Weight change: 7.953 kg (17 lb 8.5 oz) Last BM Date: 10/14/11  Intake/Output from previous day: 10/14 0701 - 10/15 0700 In: 720 [P.O.:720] Out: -   PHYSICAL EXAM General appearance: alert and mild distress Resp: rhonchi bilaterally Cardio: regular rate and rhythm, S1, S2 normal, no murmur, click, rub or gallop GI: soft, non-tender; bowel sounds normal; no masses,  no organomegaly Extremities: extremities normal, atraumatic, no cyanosis or edema  Lab Results:    Basic Metabolic Panel:  Basename 10/13/11 0436 10/12/11 1445  NA 136 138  K 4.2 4.1  CL 100 98  CO2 27 28  GLUCOSE 287* 148*  BUN 25* 18  CREATININE 1.25 1.00  CALCIUM 8.3* 9.2  MG -- --  PHOS -- --   Liver Function Tests:  Basename 10/12/11 1445  AST 13  ALT 10  ALKPHOS 76  BILITOT 1.3*  PROT 8.4*  ALBUMIN 3.8   No results found for this basename: LIPASE:2,AMYLASE:2 in the last 72 hours No results found for this basename: AMMONIA:2 in the last 72 hours CBC:  Basename 10/13/11 0436 10/12/11 1445  WBC 5.1 8.6  NEUTROABS -- 6.4  HGB 11.5* 13.2  HCT 35.2* 41.8  MCV 91.7 92.5  PLT 142* 168   Cardiac Enzymes: No results found for this basename: CKTOTAL:3,CKMB:3,CKMBINDEX:3,TROPONINI:3 in the last 72 hours BNP: No results found for this basename: POCBNP:3 in the last 72 hours D-Dimer: No results found for this basename: DDIMER:2 in the last 72 hours CBG: No results found for this basename: GLUCAP:6 in the last  72 hours Hemoglobin A1C: No results found for this basename: HGBA1C in the last 72 hours Fasting Lipid Panel: No results found for this basename: CHOL,HDL,LDLCALC,TRIG,CHOLHDL,LDLDIRECT in the last 72 hours Thyroid Function Tests: No results found for this basename: TSH,T4TOTAL,FREET4,T3FREE,THYROIDAB in the last 72 hours Anemia Panel: No results found for this basename: VITAMINB12,FOLATE,FERRITIN,TIBC,IRON,RETICCTPCT in the last 72 hours Urine Drug Screen:  Alcohol Level: No results found for this basename: ETH:2 in the last 72 hours Urinalysis:  Misc. Labs:  ABGS No results found for this basename: PHART,PCO2,PO2ART,TCO2,HCO3 in the last 72 hours CULTURES Recent Results (from the past 240 hour(s))  CULTURE, BLOOD (ROUTINE X 2)     Status: Normal (Preliminary result)   Collection Time   10/12/11  2:46 PM      Component Value Range Status Comment   Specimen Description BLOOD BLOOD LEFT ARM   Final    Special Requests BOTTLES DRAWN AEROBIC AND ANAEROBIC 4CC EACH   Final    Culture NO GROWTH 2 DAYS   Final    Report Status PENDING   Incomplete   CULTURE, BLOOD (ROUTINE X 2)     Status: Normal (Preliminary result)   Collection Time   10/12/11  2:56 PM      Component Value Range Status Comment   Specimen Description BLOOD BLOOD RIGHT HAND   Final    Special Requests BOTTLES DRAWN AEROBIC  AND ANAEROBIC 4CC EACH   Final    Culture NO GROWTH 2 DAYS   Final    Report Status PENDING   Incomplete   MRSA PCR SCREENING     Status: Normal   Collection Time   10/12/11  6:27 PM      Component Value Range Status Comment   MRSA by PCR NEGATIVE  NEGATIVE  Final    Studies/Results: Dg Chest Portable 1 View  10/15/2011  *RADIOLOGY REPORT*  Clinical Data: Pneumonia, right pneumothorax  PORTABLE CHEST - 1 VIEW  Comparison: Portable exam 0815 hours compared to 10/13/2011  Findings: Enlargement of cardiac silhouette post CABG. Minimal atherosclerotic calcification. Mediastinal contours and  pulmonary vascularity normal. Minimal decrease in size of right apex pneumothorax. Persistent right upper lobe atelectasis versus infiltrate. Underlying emphysematous changes. Probable mild bibasilar atelectasis. Bones diffusely demineralized.  IMPRESSION: Slight interval decrease in size of right pneumothorax. Otherwise no change.  Original Report Authenticated By: Lollie Marrow, M.D.    Medications:  Prior to Admission:  Prescriptions prior to admission  Medication Sig Dispense Refill  . albuterol (PROVENTIL) (5 MG/ML) 0.5% nebulizer solution Take 2.5 mg by nebulization 4 (four) times daily.        Marland Kitchen allopurinol (ZYLOPRIM) 300 MG tablet Take 300 mg by mouth daily.        Marland Kitchen aspirin 81 MG tablet Take 81 mg by mouth daily.        Marland Kitchen diltiazem (DILACOR XR) 240 MG 24 hr capsule Take 240 mg by mouth daily.        . fluticasone (FLOVENT HFA) 110 MCG/ACT inhaler Inhale 2 puffs into the lungs 2 (two) times daily.        . furosemide (LASIX) 40 MG tablet Take 40 mg by mouth daily.        Marland Kitchen glyBURIDE (DIABETA) 2.5 MG tablet Take 2.5 mg by mouth daily.        Marland Kitchen lisinopril (PRINIVIL,ZESTRIL) 10 MG tablet Take 10 mg by mouth daily.        . potassium chloride SA (K-DUR,KLOR-CON) 20 MEQ tablet Take 20 mEq by mouth daily.        . furosemide (LASIX) 40 MG tablet Take 20 mg by mouth daily.         Scheduled:   . albuterol  2.5 mg Nebulization Q6H  . allopurinol  300 mg Oral Daily  . aspirin  81 mg Oral Daily  . azithromycin  500 mg Intravenous Q24H  . cefTRIAXone (ROCEPHIN) IV  1 g Intravenous Q24H  . diltiazem  240 mg Oral Daily  . enoxaparin  40 mg Subcutaneous Q24H  . fluticasone  2 puff Inhalation BID  . furosemide  40 mg Oral Daily  . ipratropium  0.5 mg Nebulization Q6H  . lisinopril  10 mg Oral Daily  . methylPREDNISolone sodium succinate  81.25 mg Intravenous Q8H  . potassium chloride SA  20 mEq Oral Daily  . sodium chloride       Continuous:   . sodium chloride 125 mL/hr at 10/15/11  0454   UJW:[id number], acetaminophen, albuterol, alum & mag hydroxide-simeth, HYDROcodone-acetaminophen, morphine, ondansetron (ZOFRAN) IV, ondansetron, sodium chloride, zolpidem  Assesment: He has pneumonia and a pneumothorax which is somewhat smaller. He is getting better. Everything else is about the same. He has no other complaints. Principal Problem:  *Hydropneumothorax Active Problems:  Aortic valve disorders  COPD  Hypertension  Pneumonia  Renal insufficiency    Plan: No change in treatments today I  will make sure that his chest surgeon doesn't think he needs anything else done    LOS: 3 days   Lakeeta Dobosz L 10/15/2011, 8:53 AM

## 2011-10-16 MED ORDER — SODIUM CHLORIDE 0.9 % IJ SOLN
INTRAMUSCULAR | Status: AC
  Filled 2011-10-16: qty 3

## 2011-10-16 NOTE — Progress Notes (Signed)
Subjective: He says he feels better today. He was doing well yesterday and then after about 10:00 had a great deal more problem. He began having cough and congestion. At this point he seems to be improved.  Objective: Vital signs in last 24 hours: Temp:  [97.8 F (36.6 C)-98.1 F (36.7 C)] 97.8 F (36.6 C) (10/16 0500) Pulse Rate:  [67-84] 71  (10/16 0500) Resp:  [18-22] 22  (10/16 0500) BP: (100-147)/(60-76) 146/72 mmHg (10/16 0500) SpO2:  [92 %-97 %] 96 % (10/16 0736) Weight:  [87.4 kg (192 lb 10.9 oz)] 192 lb 10.9 oz (87.4 kg) (10/16 0227) Weight change: -2.2 kg (-4 lb 13.6 oz) Last BM Date: 10/14/11  Intake/Output from previous day: 10/15 0701 - 10/16 0700 In: 1453 [P.O.:1120; I.V.:33; IV Piggyback:300] Out: 0   PHYSICAL EXAM General appearance: alert, cooperative and mild distress Resp: rhonchi bilaterally Cardio: regular rate and rhythm, S1, S2 normal, no murmur, click, rub or gallop GI: soft, non-tender; bowel sounds normal; no masses,  no organomegaly Extremities: extremities normal, atraumatic, no cyanosis or edema  Lab Results:    Basic Metabolic Panel: No results found for this basename: NA:2,K:2,CL:2,CO2:2,GLUCOSE:2,BUN:2,CREATININE:2,CALCIUM:2,MG:2,PHOS:2 in the last 72 hours Liver Function Tests: No results found for this basename: AST:2,ALT:2,ALKPHOS:2,BILITOT:2,PROT:2,ALBUMIN:2 in the last 72 hours No results found for this basename: LIPASE:2,AMYLASE:2 in the last 72 hours No results found for this basename: AMMONIA:2 in the last 72 hours CBC: No results found for this basename: WBC:2,NEUTROABS:2,HGB:2,HCT:2,MCV:2,PLT:2 in the last 72 hours Cardiac Enzymes: No results found for this basename: CKTOTAL:3,CKMB:3,CKMBINDEX:3,TROPONINI:3 in the last 72 hours BNP: No results found for this basename: POCBNP:3 in the last 72 hours D-Dimer: No results found for this basename: DDIMER:2 in the last 72 hours CBG: No results found for this basename: GLUCAP:6 in the  last 72 hours Hemoglobin A1C: No results found for this basename: HGBA1C in the last 72 hours Fasting Lipid Panel: No results found for this basename: CHOL,HDL,LDLCALC,TRIG,CHOLHDL,LDLDIRECT in the last 72 hours Thyroid Function Tests: No results found for this basename: TSH,T4TOTAL,FREET4,T3FREE,THYROIDAB in the last 72 hours Anemia Panel: No results found for this basename: VITAMINB12,FOLATE,FERRITIN,TIBC,IRON,RETICCTPCT in the last 72 hours Urine Drug Screen:  Alcohol Level: No results found for this basename: ETH:2 in the last 72 hours Urinalysis:  Misc. Labs:  ABGS No results found for this basename: PHART,PCO2,PO2ART,TCO2,HCO3 in the last 72 hours CULTURES Recent Results (from the past 240 hour(s))  CULTURE, BLOOD (ROUTINE X 2)     Status: Normal (Preliminary result)   Collection Time   10/12/11  2:46 PM      Component Value Range Status Comment   Specimen Description BLOOD BLOOD LEFT ARM   Final    Special Requests BOTTLES DRAWN AEROBIC AND ANAEROBIC 4CC EACH   Final    Culture NO GROWTH 3 DAYS   Final    Report Status PENDING   Incomplete   CULTURE, BLOOD (ROUTINE X 2)     Status: Normal (Preliminary result)   Collection Time   10/12/11  2:56 PM      Component Value Range Status Comment   Specimen Description BLOOD BLOOD RIGHT HAND   Final    Special Requests BOTTLES DRAWN AEROBIC AND ANAEROBIC 4CC EACH   Final    Culture NO GROWTH 3 DAYS   Final    Report Status PENDING   Incomplete   MRSA PCR SCREENING     Status: Normal   Collection Time   10/12/11  6:27 PM      Component  Value Range Status Comment   MRSA by PCR NEGATIVE  NEGATIVE  Final    Studies/Results: Dg Chest Portable 1 View  10/15/2011  *RADIOLOGY REPORT*  Clinical Data: Pneumonia, right pneumothorax  PORTABLE CHEST - 1 VIEW  Comparison: Portable exam 0815 hours compared to 10/13/2011  Findings: Enlargement of cardiac silhouette post CABG. Minimal atherosclerotic calcification. Mediastinal contours and  pulmonary vascularity normal. Minimal decrease in size of right apex pneumothorax. Persistent right upper lobe atelectasis versus infiltrate. Underlying emphysematous changes. Probable mild bibasilar atelectasis. Bones diffusely demineralized.  IMPRESSION: Slight interval decrease in size of right pneumothorax. Otherwise no change.  Original Report Authenticated By: Lollie Marrow, M.D.    Medications:  Scheduled:   . albuterol  2.5 mg Nebulization Q6H  . allopurinol  300 mg Oral Daily  . aspirin  81 mg Oral Daily  . azithromycin  500 mg Intravenous Q24H  . cefTRIAXone (ROCEPHIN) IV  1 g Intravenous Q24H  . diltiazem  240 mg Oral Daily  . enoxaparin  40 mg Subcutaneous Q24H  . fluticasone  2 puff Inhalation BID  . furosemide  40 mg Oral Daily  . ipratropium  0.5 mg Nebulization Q6H  . lisinopril  10 mg Oral Daily  . methylPREDNISolone sodium succinate  81.25 mg Intravenous Q8H  . potassium chloride SA  20 mEq Oral Daily  . sodium chloride  3 mL Intravenous Q12H   Continuous:   . sodium chloride 250 mL (10/15/11 1959)  . DISCONTD: sodium chloride 125 mL/hr at 10/15/11 1306   VHQ:[id number], acetaminophen, albuterol, alum & mag hydroxide-simeth, HYDROcodone-acetaminophen, morphine, ondansetron (ZOFRAN) IV, ondansetron, sodium chloride, sodium chloride, zolpidem  Assesment: He seems to be improved. My concern is that that was how it was yesterday and then he had more trouble otherwise he's getting close to being ready for discharge. He has no new complaints. His blood pressure is well-controlled. Principal Problem:  *Hydropneumothorax Active Problems:  Aortic valve disorders  COPD  Hypertension  Pneumonia  Renal insufficiency    Plan: I'm going to continue his IV treatments have him stay another 24 hours to make certain that he doesn't have a relapse and then plan for discharge assuming things go well    LOS: 4 days   Illianna Paschal L 10/16/2011, 9:31 AM

## 2011-10-16 NOTE — Progress Notes (Signed)
UR Chart Review Completed  

## 2011-10-17 LAB — CULTURE, BLOOD (ROUTINE X 2): Culture: NO GROWTH

## 2011-10-17 MED ORDER — METHYLPREDNISOLONE 4 MG PO KIT: PACK | ORAL | Status: AC

## 2011-10-17 MED ORDER — CEFUROXIME AXETIL 500 MG PO TABS: 500.0000 mg | ORAL_TABLET | Freq: Two times a day (BID) | ORAL | Status: AC

## 2011-10-17 NOTE — Progress Notes (Signed)
Pt d/c home today. Arranged home health rn,pt through [facility] per pt choice.

## 2011-10-17 NOTE — Discharge Summary (Signed)
Physician Discharge Summary  Patient ID: [name] [name] MRN: [id number] DOB/AGE: [birth year] 73 y.o. Primary Care Physician:Briannia Laba L, MD Admit date: 10/12/2011 Discharge date: 10/17/2011    Discharge Diagnoses:   Principal Problem:  *Hydropneumothorax Active Problems:  Aortic valve disorders  COPD  Hypertension  Pneumonia  Renal insufficiency   Current Discharge Medication List    START taking these medications   Details  cefUROXime (CEFTIN) 500 MG tablet Take 1 tablet (500 mg total) by mouth 2 (two) times daily. Qty: 20 tablet, Refills: 0    methylPREDNISolone (MEDROL, PAK,) 4 MG tablet follow package directions Qty: 21 tablet, Refills: 0      CONTINUE these medications which have NOT CHANGED   Details  albuterol (PROVENTIL) (5 MG/ML) 0.5% nebulizer solution Take 2.5 mg by nebulization 4 (four) times daily.      allopurinol (ZYLOPRIM) 300 MG tablet Take 300 mg by mouth daily.      aspirin 81 MG tablet Take 81 mg by mouth daily.      diltiazem (DILACOR XR) 240 MG 24 hr capsule Take 240 mg by mouth daily.      fluticasone (FLOVENT HFA) 110 MCG/ACT inhaler Inhale 2 puffs into the lungs 2 (two) times daily.      !! furosemide (LASIX) 40 MG tablet Take 40 mg by mouth daily.      glyBURIDE (DIABETA) 2.5 MG tablet Take 2.5 mg by mouth daily.      lisinopril (PRINIVIL,ZESTRIL) 10 MG tablet Take 10 mg by mouth daily.      potassium chloride SA (K-DUR,KLOR-CON) 20 MEQ tablet Take 20 mEq by mouth daily.      !! furosemide (LASIX) 40 MG tablet Take 20 mg by mouth daily.       !! - Potential duplicate medications found. Please discuss with provider.      Discharged Condition: Improved    Consults: None  Significant Diagnostic Studies: Dg Chest 2 View  10/12/2011  *RADIOLOGY REPORT*  Clinical Data: Shortness of breath, smoking history, cardiac valve replaced 1 year ago  CHEST - 2 VIEW  Comparison: Portable chest x-ray of 11/17/2010  Findings:  There is a  right hydropneumothorax present of approximately 15-20%.  There is also opacity within the right upper lobe most consistent with right upper lobe pneumonia.  The left lung is clear.  Mild cardiomegaly is stable.  Median sternotomy sutures are noted.  IMPRESSION:  1.  15-20% right hydropneumothorax. 2.  Right upper lobe pneumonia. 3.  COPD.  I called the results of this study to the scribe for Dr. Effie Shy in the emergency department at [facility] at 12:45 p.m. on 10/12/2011. Dr. Effie Shy was in the middle of an emergency procedure.  Original Report Authenticated By: Juline Patch, M.D.   Dg Chest Portable 1 View  10/15/2011  *RADIOLOGY REPORT*  Clinical Data: Pneumonia, right pneumothorax  PORTABLE CHEST - 1 VIEW  Comparison: Portable exam 0815 hours compared to 10/13/2011  Findings: Enlargement of cardiac silhouette post CABG. Minimal atherosclerotic calcification. Mediastinal contours and pulmonary vascularity normal. Minimal decrease in size of right apex pneumothorax. Persistent right upper lobe atelectasis versus infiltrate. Underlying emphysematous changes. Probable mild bibasilar atelectasis. Bones diffusely demineralized.  IMPRESSION: Slight interval decrease in size of right pneumothorax. Otherwise no change.  Original Report Authenticated By: Lollie Marrow, M.D.   Dg Chest Portable 1 View  10/13/2011  *RADIOLOGY REPORT*  Clinical Data: Follow up pneumothorax  PORTABLE CHEST - 1 VIEW  Comparison: 10/12/2011  Findings: The  patient is status post median sternotomy.  Heart size is enlarged.  Again noted is 15 20% right pneumothorax.  This is unchanged from previous exam.  Airspace consolidation within the right upper lobe and right base is stable.  No new findings.  IMPRESSION:  1.  No change in the right pneumothorax.  Original Report Authenticated By: Rosealee Albee, M.D.    Lab Results: No results found for this or any previous visit (from the past 48 hour(s)). Recent Results (from the  past 240 hour(s))  CULTURE, BLOOD (ROUTINE X 2)     Status: Normal (Preliminary result)   Collection Time   10/12/11  2:46 PM      Component Value Range Status Comment   Specimen Description BLOOD BLOOD LEFT ARM   Final    Special Requests BOTTLES DRAWN AEROBIC AND ANAEROBIC 4CC EACH   Final    Culture NO GROWTH 4 DAYS   Final    Report Status PENDING   Incomplete   CULTURE, BLOOD (ROUTINE X 2)     Status: Normal (Preliminary result)   Collection Time   10/12/11  2:56 PM      Component Value Range Status Comment   Specimen Description BLOOD BLOOD RIGHT HAND   Final    Special Requests BOTTLES DRAWN AEROBIC AND ANAEROBIC 4CC EACH   Final    Culture NO GROWTH 4 DAYS   Final    Report Status PENDING   Incomplete   MRSA PCR SCREENING     Status: Normal   Collection Time   10/12/11  6:27 PM      Component Value Range Status Comment   MRSA by PCR NEGATIVE  NEGATIVE  Final      Hospital Course: He was admitted with pneumonia and a hydropneumothorax. He was treated with intravenous antibiotics inhaled bronchodilators oxygen incentive spirometry etc. He improved greatly over the next several days to the point that he was at baseline and ready for  Discharge Exam: Blood pressure 126/69, pulse 75, temperature 97.6 F (36.4 C), temperature source Oral, resp. rate 16, height 5\' 6"  (1.676 m), weight 87.4 kg (192 lb 10.9 oz), SpO2 99.00%. His chest was much clearer and his pneumothorax was almost resolved  Disposition: Home with home health services  Discharge Orders    Future Orders Please Complete By Expires   Home Health      Questions: Responses:   To provide the following care/treatments PT    RN   Face-to-face encounter      Comments:   I Shanley Furlough L certify that this patient is under my care and that I, or a nurse practitioner or physician's assistant working with me, had a face-to-face encounter that meets the physician face-to-face encounter requirements with this patient on  10/17/2011.       Questions: Responses:   The encounter with the patient was in whole, or in part, for the following medical condition, which is the primary reason for home health care pneumonia   I certify that, based on my findings, the following services are medically necessary home health services Nursing    Physical therapy   My clinical findings support the need for the above services High Risk for rehospitalization   Further, I certify that my clinical findings support that this patient is homebound (i.e. absences from home require considerable and taxing effort and are for medical reasons or religious services or infrequently or of short duration when for other reasons) Ambulates short distances less than  300 feet   To provide the following care/treatments PT    RN        Signed: Brian Kocourek L 10/17/2011, 9:01 AM

## 2011-10-17 NOTE — Progress Notes (Signed)
Subjective: He is much improved. He wants to go home  Objective: Vital signs in last 24 hours: Temp:  [97.5 F (36.4 C)-98 F (36.7 C)] 97.6 F (36.4 C) (10/17 0553) Pulse Rate:  [75-117] 75  (10/17 0553) Resp:  [16-20] 16  (10/17 0553) BP: (126-144)/(69-83) 126/69 mmHg (10/17 0553) SpO2:  [92 %-99 %] 99 % (10/17 0728) Weight change:  Last BM Date: 10/16/11  Intake/Output from previous day: 10/16 0701 - 10/17 0700 In: 480 [P.O.:480] Out: -   PHYSICAL EXAM General appearance: alert, cooperative and no distress Resp: clear to auscultation bilaterally Cardio: regular rate and rhythm, S1, S2 normal, no murmur, click, rub or gallop GI: soft, non-tender; bowel sounds normal; no masses,  no organomegaly Extremities: extremities normal, atraumatic, no cyanosis or edema  Lab Results:    Basic Metabolic Panel: No results found for this basename: NA:2,K:2,CL:2,CO2:2,GLUCOSE:2,BUN:2,CREATININE:2,CALCIUM:2,MG:2,PHOS:2 in the last 72 hours Liver Function Tests: No results found for this basename: AST:2,ALT:2,ALKPHOS:2,BILITOT:2,PROT:2,ALBUMIN:2 in the last 72 hours No results found for this basename: LIPASE:2,AMYLASE:2 in the last 72 hours No results found for this basename: AMMONIA:2 in the last 72 hours CBC: No results found for this basename: WBC:2,NEUTROABS:2,HGB:2,HCT:2,MCV:2,PLT:2 in the last 72 hours Cardiac Enzymes: No results found for this basename: CKTOTAL:3,CKMB:3,CKMBINDEX:3,TROPONINI:3 in the last 72 hours BNP: No results found for this basename: POCBNP:3 in the last 72 hours D-Dimer: No results found for this basename: DDIMER:2 in the last 72 hours CBG: No results found for this basename: GLUCAP:6 in the last 72 hours Hemoglobin A1C: No results found for this basename: HGBA1C in the last 72 hours Fasting Lipid Panel: No results found for this basename: CHOL,HDL,LDLCALC,TRIG,CHOLHDL,LDLDIRECT in the last 72 hours Thyroid Function Tests: No results found for this  basename: TSH,T4TOTAL,FREET4,T3FREE,THYROIDAB in the last 72 hours Anemia Panel: No results found for this basename: VITAMINB12,FOLATE,FERRITIN,TIBC,IRON,RETICCTPCT in the last 72 hours Urine Drug Screen:  Alcohol Level: No results found for this basename: ETH:2 in the last 72 hours Urinalysis:  Misc. Labs:  ABGS No results found for this basename: PHART,PCO2,PO2ART,TCO2,HCO3 in the last 72 hours CULTURES Recent Results (from the past 240 hour(s))  CULTURE, BLOOD (ROUTINE X 2)     Status: Normal (Preliminary result)   Collection Time   10/12/11  2:46 PM      Component Value Range Status Comment   Specimen Description BLOOD BLOOD LEFT ARM   Final    Special Requests BOTTLES DRAWN AEROBIC AND ANAEROBIC 4CC EACH   Final    Culture NO GROWTH 4 DAYS   Final    Report Status PENDING   Incomplete   CULTURE, BLOOD (ROUTINE X 2)     Status: Normal (Preliminary result)   Collection Time   10/12/11  2:56 PM      Component Value Range Status Comment   Specimen Description BLOOD BLOOD RIGHT HAND   Final    Special Requests BOTTLES DRAWN AEROBIC AND ANAEROBIC 4CC EACH   Final    Culture NO GROWTH 4 DAYS   Final    Report Status PENDING   Incomplete   MRSA PCR SCREENING     Status: Normal   Collection Time   10/12/11  6:27 PM      Component Value Range Status Comment   MRSA by PCR NEGATIVE  NEGATIVE  Final    Studies/Results: No results found.  Medications:  Prior to Admission:  Prescriptions prior to admission  Medication Sig Dispense Refill  . albuterol (PROVENTIL) (5 MG/ML) 0.5% nebulizer solution Take 2.5 mg  by nebulization 4 (four) times daily.        Marland Kitchen allopurinol (ZYLOPRIM) 300 MG tablet Take 300 mg by mouth daily.        Marland Kitchen aspirin 81 MG tablet Take 81 mg by mouth daily.        Marland Kitchen diltiazem (DILACOR XR) 240 MG 24 hr capsule Take 240 mg by mouth daily.        . fluticasone (FLOVENT HFA) 110 MCG/ACT inhaler Inhale 2 puffs into the lungs 2 (two) times daily.        . furosemide  (LASIX) 40 MG tablet Take 40 mg by mouth daily.        Marland Kitchen glyBURIDE (DIABETA) 2.5 MG tablet Take 2.5 mg by mouth daily.        Marland Kitchen lisinopril (PRINIVIL,ZESTRIL) 10 MG tablet Take 10 mg by mouth daily.        . potassium chloride SA (K-DUR,KLOR-CON) 20 MEQ tablet Take 20 mEq by mouth daily.        . furosemide (LASIX) 40 MG tablet Take 20 mg by mouth daily.         Scheduled:   . albuterol  2.5 mg Nebulization Q6H  . allopurinol  300 mg Oral Daily  . aspirin  81 mg Oral Daily  . azithromycin  500 mg Intravenous Q24H  . cefTRIAXone (ROCEPHIN) IV  1 g Intravenous Q24H  . diltiazem  240 mg Oral Daily  . enoxaparin  40 mg Subcutaneous Q24H  . fluticasone  2 puff Inhalation BID  . furosemide  40 mg Oral Daily  . ipratropium  0.5 mg Nebulization Q6H  . lisinopril  10 mg Oral Daily  . methylPREDNISolone sodium succinate  81.25 mg Intravenous Q8H  . potassium chloride SA  20 mEq Oral Daily  . sodium chloride  3 mL Intravenous Q12H  . sodium chloride       Continuous:   . sodium chloride 250 mL (10/15/11 1959)   YQM:VHQIONGEXBMWU, acetaminophen, albuterol, alum & mag hydroxide-simeth, HYDROcodone-acetaminophen, morphine, ondansetron (ZOFRAN) IV, ondansetron, sodium chloride, sodium chloride, zolpidem  Assesment: He is admitted with pneumonia and pneumothorax. He is much improved. I discussed the situation with his chest surgeon yesterday and no further treatment or pneumothorax is anticipated. He will need chest x-ray. He is going to be discharged home today Principal Problem:  *Hydropneumothorax Active Problems:  Aortic valve disorders  COPD  Hypertension  Pneumonia  Renal insufficiency    Plan: For discharge today    LOS: 5 days   [name] [name] L 10/17/2011, 8:58 AM

## 2011-10-17 NOTE — Progress Notes (Signed)
Pt discharged home today per Dr. [name]. Pt's IV site D/C'd and WNL. Pt's VS stable at this time. Pt provided with home medication list, discharge instructions, and prescriptions. Pt  Verbalized understanding. Pt left floor via WC accompanied by NT in stable condition. Dagoberto Ligas, RN

## 2011-10-22 ENCOUNTER — Other Ambulatory Visit (HOSPITAL_COMMUNITY): Payer: Self-pay | Admitting: Pulmonary Disease

## 2011-10-22 ENCOUNTER — Ambulatory Visit (HOSPITAL_COMMUNITY)
Admission: RE | Admit: 2011-10-22 | Discharge: 2011-10-22 | Disposition: A | Discharge status: Home / Self Care | Payer: Medicare Other | Source: Ambulatory Visit | Attending: Pulmonary Disease | Admitting: Pulmonary Disease

## 2011-10-22 DIAGNOSIS — J9383 Other pneumothorax: Secondary | ICD-10-CM | POA: Insufficient documentation

## 2011-10-26 ENCOUNTER — Emergency Department (HOSPITAL_COMMUNITY)
Admission: EM | Admit: 2011-10-26 | Discharge: 2011-10-26 | Disposition: A | Discharge status: Home / Self Care | Payer: Medicare Other | Attending: Emergency Medicine | Admitting: Emergency Medicine

## 2011-10-26 ENCOUNTER — Encounter (HOSPITAL_COMMUNITY): Payer: Self-pay

## 2011-10-26 DIAGNOSIS — N390 Urinary tract infection, site not specified: Secondary | ICD-10-CM | POA: Insufficient documentation

## 2011-10-26 DIAGNOSIS — D72829 Elevated white blood cell count, unspecified: Secondary | ICD-10-CM | POA: Insufficient documentation

## 2011-10-26 DIAGNOSIS — E139 Other specified diabetes mellitus without complications: Secondary | ICD-10-CM | POA: Insufficient documentation

## 2011-10-26 DIAGNOSIS — I4891 Unspecified atrial fibrillation: Secondary | ICD-10-CM | POA: Insufficient documentation

## 2011-10-26 DIAGNOSIS — I10 Essential (primary) hypertension: Secondary | ICD-10-CM | POA: Insufficient documentation

## 2011-10-26 DIAGNOSIS — R739 Hyperglycemia, unspecified: Secondary | ICD-10-CM

## 2011-10-26 DIAGNOSIS — Z794 Long term (current) use of insulin: Secondary | ICD-10-CM | POA: Insufficient documentation

## 2011-10-26 DIAGNOSIS — Z954 Presence of other heart-valve replacement: Secondary | ICD-10-CM | POA: Insufficient documentation

## 2011-10-26 DIAGNOSIS — M129 Arthropathy, unspecified: Secondary | ICD-10-CM | POA: Insufficient documentation

## 2011-10-26 DIAGNOSIS — J449 Chronic obstructive pulmonary disease, unspecified: Secondary | ICD-10-CM | POA: Insufficient documentation

## 2011-10-26 DIAGNOSIS — J4489 Other specified chronic obstructive pulmonary disease: Secondary | ICD-10-CM | POA: Insufficient documentation

## 2011-10-26 DIAGNOSIS — Z87891 Personal history of nicotine dependence: Secondary | ICD-10-CM | POA: Insufficient documentation

## 2011-10-26 LAB — DIFFERENTIAL
Basophils Absolute: 0 10*3/uL (ref 0.0–0.1)
Eosinophils Absolute: 0 10*3/uL (ref 0.0–0.7)
Lymphs Abs: 1.5 10*3/uL (ref 0.7–4.0)
Monocytes Absolute: 4 10*3/uL — ABNORMAL HIGH (ref 0.1–1.0)
Monocytes Relative: 16 % — ABNORMAL HIGH (ref 3–12)

## 2011-10-26 LAB — COMPREHENSIVE METABOLIC PANEL
AST: 8 U/L (ref 0–37)
Albumin: 3.1 g/dL — ABNORMAL LOW (ref 3.5–5.2)
Calcium: 9.2 mg/dL (ref 8.4–10.5)
Chloride: 92 mEq/L — ABNORMAL LOW (ref 96–112)
Creatinine, Ser: 1.17 mg/dL (ref 0.50–1.35)
Total Protein: 7.2 g/dL (ref 6.0–8.3)

## 2011-10-26 LAB — URINALYSIS, ROUTINE W REFLEX MICROSCOPIC
Glucose, UA: >1000 mg/dL — AB
Leukocytes, UA: NEGATIVE
Specific Gravity, Urine: 1.02 (ref 1.005–1.030)
pH: 5.5 (ref 5.0–8.0)

## 2011-10-26 LAB — GLUCOSE, CAPILLARY: Glucose-Capillary: 262 mg/dL — ABNORMAL HIGH (ref 70–99)

## 2011-10-26 LAB — CBC
MCHC: 32.8 g/dL (ref 30.0–36.0)
MCV: 89.7 fL (ref 78.0–100.0)
Platelets: 132 10*3/uL — ABNORMAL LOW (ref 150–400)
RDW: 16.5 % — ABNORMAL HIGH (ref 11.5–15.5)
WBC: 24.7 10*3/uL — ABNORMAL HIGH (ref 4.0–10.5)

## 2011-10-26 LAB — URINE MICROSCOPIC-ADD ON

## 2011-10-26 MED ORDER — INSULIN ASPART 100 UNIT/ML ~~LOC~~ SOLN
5.0000 [IU] | Freq: Once | SUBCUTANEOUS | Status: AC
  Administered 2011-10-26: 5 [IU] via SUBCUTANEOUS

## 2011-10-26 MED ORDER — CIPROFLOXACIN HCL 250 MG PO TABS
500.0000 mg | ORAL_TABLET | Freq: Once | ORAL | Status: AC
  Administered 2011-10-26: 500 mg via ORAL
  Filled 2011-10-26: qty 2

## 2011-10-26 MED ORDER — LEVOFLOXACIN 500 MG PO TABS: 500.0000 mg | ORAL_TABLET | Freq: Every day | ORAL | Status: AC

## 2011-10-26 MED ORDER — INSULIN ASPART 100 UNIT/ML ~~LOC~~ SOLN
6.0000 [IU] | Freq: Once | SUBCUTANEOUS | Status: AC
  Administered 2011-10-26: 6 [IU] via INTRAVENOUS

## 2011-10-26 MED ORDER — SODIUM CHLORIDE 0.9 % IV SOLN
INTRAVENOUS | Status: DC
  Administered 2011-10-26: 15:00:00 via INTRAVENOUS

## 2011-10-26 NOTE — ED Provider Notes (Signed)
History     CSN: [id number] Arrival date & time: 10/26/2011  2:31 PM   First MD Initiated Contact with Patient 10/26/11 1435      Chief Complaint  Patient presents with  . Hyperglycemia    (Consider location/radiation/quality/duration/timing/severity/associated sxs/prior treatment) HPI patient and his wife relates he was discharged from the hospital 2 weeks agowhen he was admitted for pneumonia. He  relates he took his last dose of antibiotic today. He was started on prednisone 4 days ago and was taking 4 tablets a day and today is the first day he was taking 3 tablets he does not know the strength of the tablets. He relates 3 or 4 days ago he started having thirst and polyuria states he's having urinary incontinence and urgency. He states he has no energy and he hasn't been sleeping for a week. He denies fever nausea or vomiting or diarrhea. He states his appetite is good he states he still has a cough but it's improved and sometimes he has white mucous he has wheezing he uses nebulizers for. Today the home health nurse came out and then called back and told him to bring him to the emergency room for his blood sugar being high.     Past Medical History  Diagnosis Date  . COPD (chronic obstructive pulmonary disease)   . AF (atrial fibrillation)   . Hypertension   . Gout   . Arthritis   . Pneumonia     history of pneumonia  . Small bowel obstruction   . Renal insufficiency   . Aortic stenosis     Past Surgical History  Procedure Date  . Rotator cuff repair   . Cardiac valve replacement     aortic valve with a tissue prosthesis and left sided maze proc  . S/p rewiring of sternum for dehiscence   . Gallbladder surgery   . US echocardiography 01-03-11    EF 55-60%  . Cardiovascular stress test 01-26-09    EF 67%  . Coronary angioplasty     Family History  Problem Relation Age of Onset  . Hypertension Mother   . Stroke Mother   . Heart disease Sister   . Kidney disease  Sister     History  Substance Use Topics  . Smoking status: Former Smoker    Types: Cigarettes    Quit date: 01/18/2005  . Smokeless tobacco: Not on file  . Alcohol Use: No    lives at home with his wife    Review of Systems  All other systems reviewed and are negative.    Allergies  Review of patient's allergies indicates no known allergies.  Home Medications   Current Outpatient Rx  Name Route Sig Dispense Refill  . ALBUTEROL SULFATE (5 MG/ML) 0.5% IN NEBU Nebulization Take 2.5 mg by nebulization 4 (four) times daily.      . ALLOPURINOL 300 MG PO TABS Oral Take 300 mg by mouth daily.      . ASPIRIN 81 MG PO TABS Oral Take 81 mg by mouth daily.      Marland Kitchen DILTIAZEM HCL CR 240 MG PO CP24 Oral Take 240 mg by mouth daily.      Marland Kitchen FLUTICASONE PROPIONATE  HFA 110 MCG/ACT IN AERO Inhalation Inhale 2 puffs into the lungs 2 (two) times daily.      . FUROSEMIDE 40 MG PO TABS Oral Take 40 mg by mouth daily.      . GLYBURIDE 2.5 MG PO TABS Oral Take 2.5  mg by mouth daily.      Marland Kitchen LISINOPRIL 10 MG PO TABS Oral Take 10 mg by mouth daily.      Marland Kitchen POTASSIUM CHLORIDE CRYS CR 20 MEQ PO TBCR Oral Take 20 mEq by mouth daily.      Marland Kitchen PREDNISONE (PAK) 10 MG PO TABS Oral Take 10 mg by mouth daily.      Marland Kitchen CEFUROXIME AXETIL 500 MG PO TABS Oral Take 1 tablet (500 mg total) by mouth 2 (two) times daily. 20 tablet 0  . FUROSEMIDE 40 MG PO TABS Oral Take 20 mg by mouth daily.        BP 116/70  Pulse 85  Temp(Src) 98.5 F (36.9 C) (Oral)  Resp 20  Ht 5\' 6"  (1.676 m)  Wt 177 lb (80.287 kg)  BMI 28.57 kg/m2  SpO2 98%  Physical Exam  Constitutional: He is oriented to person, place, and time. He appears well-developed and well-nourished.  HENT:  Head: Normocephalic and atraumatic.  Eyes: Conjunctivae and EOM are normal. Pupils are equal, round, and reactive to light.  Neck: Normal range of motion. Neck supple.  Cardiovascular: Normal rate, regular rhythm and normal heart sounds.   Pulmonary/Chest:  Effort normal and breath sounds normal.       Patient has rare rhonchi at the bases.  Abdominal: Soft. Bowel sounds are normal.  Musculoskeletal: Normal range of motion.  Neurological: He is alert and oriented to person, place, and time.  Skin: Skin is warm and dry.  Psychiatric: He has a normal mood and affect. His behavior is normal.    ED Course  Procedures (including critical care time)  Results for orders placed during the hospital encounter of 10/26/11  GLUCOSE, CAPILLARY      Component Value Range   Glucose-Capillary 480 (*) 70 - 99 (mg/dL)   Comment 1 Notify RN    CBC      Component Value Range   WBC 24.7 (*) 4.0 - 10.5 (K/uL)   RBC 4.18 (*) 4.22 - 5.81 (MIL/uL)   Hemoglobin 12.3 (*) 13.0 - 17.0 (g/dL)   HCT 40.9 (*) 81.1 - 52.0 (%)   MCV 89.7  78.0 - 100.0 (fL)   MCH 29.4  26.0 - 34.0 (pg)   MCHC 32.8  30.0 - 36.0 (g/dL)   RDW 91.4 (*) 78.2 - 15.5 (%)   Platelets 132 (*) 150 - 400 (K/uL)  DIFFERENTIAL      Component Value Range   Neutrophils Relative 78 (*) 43 - 77 (%)   Lymphocytes Relative 6 (*) 12 - 46 (%)   Monocytes Relative 16 (*) 3 - 12 (%)   Eosinophils Relative 0  0 - 5 (%)   Basophils Relative 0  0 - 1 (%)   Neutro Abs 19.2 (*) 1.7 - 7.7 (K/uL)   Lymphs Abs 1.5  0.7 - 4.0 (K/uL)   Monocytes Absolute 4.0 (*) 0.1 - 1.0 (K/uL)   Eosinophils Absolute 0.0  0.0 - 0.7 (K/uL)   Basophils Absolute 0.0  0.0 - 0.1 (K/uL)   WBC Morphology       Value: MODERATE LEFT SHIFT (>5% METAS AND MYELOS,OCC PRO NOTED)  COMPREHENSIVE METABOLIC PANEL      Component Value Range   Sodium 130 (*) 135 - 145 (mEq/L)   Potassium 4.5  3.5 - 5.1 (mEq/L)   Chloride 92 (*) 96 - 112 (mEq/L)   CO2 29  19 - 32 (mEq/L)   Glucose, Bld 471 (*) 70 - 99 (mg/dL)  BUN 33 (*) 6 - 23 (mg/dL)   Creatinine, Ser 1.61  0.50 - 1.35 (mg/dL)   Calcium 9.2  8.4 - 09.6 (mg/dL)   Total Protein 7.2  6.0 - 8.3 (g/dL)   Albumin 3.1 (*) 3.5 - 5.2 (g/dL)   AST 8  0 - 37 (U/L)   ALT 14  0 - 53 (U/L)    Alkaline Phosphatase 105  39 - 117 (U/L)   Total Bilirubin 1.3 (*) 0.3 - 1.2 (mg/dL)   GFR calc non Af Amer 60 (*) >90 (mL/min)   GFR calc Af Amer 70 (*) >90 (mL/min)  URINALYSIS, ROUTINE W REFLEX MICROSCOPIC      Component Value Range   Color, Urine YELLOW  YELLOW    Appearance CLEAR  CLEAR    Specific Gravity, Urine 1.020  1.005 - 1.030    pH 5.5  5.0 - 8.0    Glucose, UA >1000 (*) NEGATIVE (mg/dL)   Hgb urine dipstick TRACE (*) NEGATIVE    Bilirubin Urine NEGATIVE  NEGATIVE    Ketones, ur NEGATIVE  NEGATIVE (mg/dL)   Protein, ur NEGATIVE  NEGATIVE (mg/dL)   Urobilinogen, UA 0.2  0.0 - 1.0 (mg/dL)   Nitrite NEGATIVE  NEGATIVE    Leukocytes, UA NEGATIVE  NEGATIVE   URINE MICROSCOPIC-ADD ON      Component Value Range   WBC, UA 3-6  <3 (WBC/hpf)   RBC / HPF 3-6  <3 (RBC/hpf)   Bacteria, UA RARE  RARE   GLUCOSE, CAPILLARY      Component Value Range   Glucose-Capillary 262 (*) 70 - 99 (mg/dL)   Comment 1 Documented in Chart     Comment 2 Notify RN     Laboratory interpretation of hyperglycemia with glucose urea possible urinary tract infection. Patient has leukocytosis consistent with being on steroids.  Course patient was given IV fluids and IV insulin his blood sugar improved from  471 to 262 and he was given subcutaneous insulin prior to discharge. We discussed with his wife and patient about following diet carefully while on the prednisone and he may need to increase his glyburide to  twice a day. Patient has a glucose monitor at home that he can follow his blood sugars.   MDM    Diagnoses that have been ruled out:  Diagnoses that are still under consideration:  Final diagnoses:  Hyperglycemia, drug-induced  Urinary tract infection    Medications    0.9 %  sodium chloride infusion (  Intravenous Canceled Entry 10/26/11 1715)  ciprofloxacin (CIPRO) tablet 500 mg (not administered)  levofloxacin (LEVAQUIN) 500 MG tablet (not administered)  insulin aspart (novoLOG)  injection 6 Units (6 Units Intravenous Given 10/26/11 1756)  insulin aspart (novoLOG) injection 5 Units (5 Units Subcutaneous Given 10/26/11 1906)     Devoria Albe, MD, Armando Gang     Ward Givens, MD 10/26/11 2157

## 2011-10-26 NOTE — ED Notes (Signed)
Pt type 2 diabetic controlled with oral hypoglycemics; states was admitted to hospital 2 weeks ago with pneumonia; pt is presently on prednisone taper, and states his blood sugar has been high since; denies n/v/d; c/o increased thirst and frequent urination.

## 2011-10-26 NOTE — ED Notes (Signed)
Pt presents with BG of 547 at 1350. Pt recently admitted for Pneumonia and is on steroids. Home health nurse advised pt to be seen in ED. NAD at this time. Pt ambulated to triage with steady gate.

## 2011-12-12 ENCOUNTER — Ambulatory Visit (HOSPITAL_COMMUNITY)
Admission: RE | Admit: 2011-12-12 | Discharge: 2011-12-12 | Disposition: A | Discharge status: Home / Self Care | Payer: Medicare Other | Source: Ambulatory Visit | Attending: Pulmonary Disease | Admitting: Pulmonary Disease

## 2011-12-12 ENCOUNTER — Other Ambulatory Visit (HOSPITAL_COMMUNITY): Payer: Self-pay | Admitting: Pulmonary Disease

## 2011-12-12 DIAGNOSIS — R1031 Right lower quadrant pain: Secondary | ICD-10-CM | POA: Insufficient documentation

## 2012-02-03 ENCOUNTER — Emergency Department (HOSPITAL_COMMUNITY)
Admission: EM | Admit: 2012-02-03 | Discharge: 2012-02-03 | Disposition: A | Discharge status: Home / Self Care | Payer: Medicare Other | Attending: Emergency Medicine | Admitting: Emergency Medicine

## 2012-02-03 ENCOUNTER — Emergency Department (HOSPITAL_COMMUNITY): Payer: Medicare Other

## 2012-02-03 ENCOUNTER — Encounter (HOSPITAL_COMMUNITY): Payer: Self-pay | Admitting: *Deleted

## 2012-02-03 DIAGNOSIS — I4891 Unspecified atrial fibrillation: Secondary | ICD-10-CM | POA: Insufficient documentation

## 2012-02-03 DIAGNOSIS — J449 Chronic obstructive pulmonary disease, unspecified: Secondary | ICD-10-CM | POA: Insufficient documentation

## 2012-02-03 DIAGNOSIS — Z954 Presence of other heart-valve replacement: Secondary | ICD-10-CM | POA: Insufficient documentation

## 2012-02-03 DIAGNOSIS — Z7982 Long term (current) use of aspirin: Secondary | ICD-10-CM | POA: Insufficient documentation

## 2012-02-03 DIAGNOSIS — M109 Gout, unspecified: Secondary | ICD-10-CM | POA: Insufficient documentation

## 2012-02-03 DIAGNOSIS — R05 Cough: Secondary | ICD-10-CM | POA: Insufficient documentation

## 2012-02-03 DIAGNOSIS — Z87891 Personal history of nicotine dependence: Secondary | ICD-10-CM | POA: Insufficient documentation

## 2012-02-03 DIAGNOSIS — R062 Wheezing: Secondary | ICD-10-CM | POA: Insufficient documentation

## 2012-02-03 DIAGNOSIS — Z8701 Personal history of pneumonia (recurrent): Secondary | ICD-10-CM | POA: Insufficient documentation

## 2012-02-03 DIAGNOSIS — I359 Nonrheumatic aortic valve disorder, unspecified: Secondary | ICD-10-CM | POA: Insufficient documentation

## 2012-02-03 DIAGNOSIS — R059 Cough, unspecified: Secondary | ICD-10-CM | POA: Insufficient documentation

## 2012-02-03 DIAGNOSIS — J4489 Other specified chronic obstructive pulmonary disease: Secondary | ICD-10-CM | POA: Insufficient documentation

## 2012-02-03 DIAGNOSIS — I10 Essential (primary) hypertension: Secondary | ICD-10-CM | POA: Insufficient documentation

## 2012-02-03 DIAGNOSIS — J40 Bronchitis, not specified as acute or chronic: Secondary | ICD-10-CM

## 2012-02-03 MED ORDER — MOXIFLOXACIN HCL 400 MG PO TABS: 400.0000 mg | ORAL_TABLET | Freq: Every day | ORAL | Status: AC

## 2012-02-03 NOTE — ED Provider Notes (Signed)
History  Scribed for [name] Lennert, MD, the patient was seen in room APA10/APA10. This chart was scribed by Candelaria Stagers. The patient's care started at 8:53 AM    CSN: [id number]  Arrival date & time 02/03/12  5621   First MD Initiated Contact with Patient 02/03/12 0757      Chief Complaint  Patient presents with  . Cough     Patient is a 76 y.o. male presenting with cough. The history is provided by the patient. No language interpreter was used.  Cough This is a new problem. The current episode started 12 to 24 hours ago. The problem occurs every few minutes. The problem has not changed since onset.The cough is productive of sputum. There has been no fever. Associated symptoms include wheezing. Pertinent negatives include no chest pain and no headaches.   [name] [name] is a 76 y.o. male who presents to the Emergency Department complaining of a productive cough that he states he has had for "a while", but has gotten worse today.  He is also experiencing wheezing that is worse than baseline as well has sinus pressure and congestion.  Pt has a h/o COPD and was seen in the ED three months ago with a collapsed lung.  Pt has used a breathing treatment which has helped the wheezing somewhat.  He uses O2 at night and uses breathing treatments daily.  His PCP is Dr. [name].      Past Medical History  Diagnosis Date  . COPD (chronic obstructive pulmonary disease)   . AF (atrial fibrillation)   . Hypertension   . Gout   . Arthritis   . Pneumonia     history of pneumonia  . Small bowel obstruction   . Renal insufficiency   . Aortic stenosis     Past Surgical History  Procedure Date  . Rotator cuff repair   . Cardiac valve replacement     aortic valve with a tissue prosthesis and left sided maze proc  . S/p rewiring of sternum for dehiscence   . Gallbladder surgery   . US echocardiography 01-03-11    EF 55-60%  . Cardiovascular stress test 01-26-09    EF 67%  . Coronary  angioplasty     Family History  Problem Relation Age of Onset  . Hypertension Mother   . Stroke Mother   . Heart disease Sister   . Kidney disease Sister     History  Substance Use Topics  . Smoking status: Former Smoker    Types: Cigarettes    Quit date: 01/18/2005  . Smokeless tobacco: Not on file  . Alcohol Use: No      Review of Systems  Constitutional: Negative for fever and fatigue.  HENT: Negative for congestion, sinus pressure and ear discharge.   Eyes: Negative for discharge.  Respiratory: Positive for cough and wheezing.   Cardiovascular: Negative for chest pain.  Gastrointestinal: Negative for nausea, vomiting, abdominal pain and diarrhea.  Genitourinary: Negative for frequency and hematuria.  Musculoskeletal: Negative for back pain.  Skin: Negative for rash.  Neurological: Negative for seizures and headaches.  Hematological: Negative.   Psychiatric/Behavioral: Negative for hallucinations.  All other systems reviewed and are negative.    Allergies  Review of patient's allergies indicates no known allergies.  Home Medications   Current Outpatient Rx  Name Route Sig Dispense Refill  . ALBUTEROL SULFATE (5 MG/ML) 0.5% IN NEBU Nebulization Take 2.5 mg by nebulization 4 (four) times daily.      Marland Kitchen  ALLOPURINOL 300 MG PO TABS Oral Take 300 mg by mouth daily.      . ASPIRIN 81 MG PO TABS Oral Take 81 mg by mouth daily.      Marland Kitchen DILTIAZEM HCL ER 240 MG PO CP24 Oral Take 240 mg by mouth daily.      Marland Kitchen FLUTICASONE PROPIONATE  HFA 110 MCG/ACT IN AERO Inhalation Inhale 2 puffs into the lungs 2 (two) times daily.      . FUROSEMIDE 40 MG PO TABS Oral Take 40 mg by mouth daily.      . FUROSEMIDE 40 MG PO TABS Oral Take 20 mg by mouth daily.      . GLYBURIDE 2.5 MG PO TABS Oral Take 2.5 mg by mouth daily.      Marland Kitchen LISINOPRIL 10 MG PO TABS Oral Take 10 mg by mouth daily.      Marland Kitchen POTASSIUM CHLORIDE CRYS ER 20 MEQ PO TBCR Oral Take 20 mEq by mouth daily.      Marland Kitchen PREDNISONE  (PAK) 10 MG PO TABS Oral Take 10 mg by mouth daily.        BP 140/80  Pulse 97  Temp(Src) 97.8 F (36.6 C) (Oral)  Resp 18  Ht 5\' 6"  (1.676 m)  Wt 178 lb (80.74 kg)  BMI 28.73 kg/m2  SpO2 95%  Physical Exam  Nursing note and vitals reviewed. Constitutional: He is oriented to person, place, and time. He appears well-developed and well-nourished.  HENT:  Head: Normocephalic and atraumatic.  Eyes: Right eye exhibits no discharge. Left eye exhibits no discharge.  Neck: Normal range of motion. Neck supple.  Pulmonary/Chest: Effort normal and breath sounds normal. He exhibits no tenderness.  Abdominal: Soft. There is no tenderness. There is no rebound.  Musculoskeletal: Normal range of motion. He exhibits no tenderness.       Baseline ROM, no obvious new focal weakness.  Neurological: He is alert and oriented to person, place, and time.       Mental status and motor strength appears baseline for patient and situation.  Skin: Skin is warm and dry. No rash noted.  Psychiatric: He has a normal mood and affect. His behavior is normal.    ED Course  Procedures   DIAGNOSTIC STUDIES: Oxygen Saturation is 95% on room air, normal by my interpretation.    COORDINATION OF CARE:  7:57AM Ordered: DG CHEST 2 VIEW    Labs Reviewed - No data to display Dg Chest 2 View  02/03/2012  *RADIOLOGY REPORT*  Clinical Data: Cough and congestion  CHEST - 2 VIEW  Comparison: 10/22/2011  Findings: Evidence of median sternotomy and aortic valvuloplasty noted.  Right upper lung zone scarring and adjacent pleural thickening are stable.  Costophrenic angles are omitted from the frontal projection.  Heart size is mildly enlarged.  No edema or new focal pulmonary opacity.  No pleural effusion.  No acute osseous abnormality.  IMPRESSION: Stable findings as above, no acute intra pulmonary process.  Original Report Authenticated By: Harrel Lemon, M.D.     No diagnosis found.    MDM   The chart was  scribed for me under my direct supervision.  I personally performed the history, physical, and medical decision making and all procedures in the evaluation of this patient.[name] Lennert, MD 02/03/12 [phone]

## 2012-02-03 NOTE — ED Notes (Signed)
Pt c/o dry cough, congestion and wheezing since yesterday afternoon. Pt denies fever.

## 2012-04-01 ENCOUNTER — Encounter: Payer: Self-pay | Admitting: Cardiology

## 2012-04-01 ENCOUNTER — Ambulatory Visit (INDEPENDENT_AMBULATORY_CARE_PROVIDER_SITE_OTHER): Payer: Medicare Other | Admitting: Cardiology

## 2012-04-01 VITALS — BP 130/78 | HR 78 | Ht 66.0 in | Wt 185.0 lb

## 2012-04-01 DIAGNOSIS — I359 Nonrheumatic aortic valve disorder, unspecified: Secondary | ICD-10-CM

## 2012-04-01 DIAGNOSIS — I4891 Unspecified atrial fibrillation: Secondary | ICD-10-CM

## 2012-04-01 DIAGNOSIS — I10 Essential (primary) hypertension: Secondary | ICD-10-CM

## 2012-04-01 DIAGNOSIS — Z952 Presence of prosthetic heart valve: Secondary | ICD-10-CM

## 2012-04-01 DIAGNOSIS — Z954 Presence of other heart-valve replacement: Secondary | ICD-10-CM

## 2012-04-01 NOTE — Assessment & Plan Note (Signed)
He continues to do very well postop replacement. His bowel sounds normal. We will continue with the baby aspirin daily.

## 2012-04-01 NOTE — Assessment & Plan Note (Signed)
Blood pressure is in excellent control. 

## 2012-04-01 NOTE — Progress Notes (Signed)
HPI [name] [name] is seen for followup today. He states he feels very well.  He denies any chest pain, shortness of breath, palpitations, or dizziness.  He is active doing all his own yard work. He has had no edema. He was admitted in October of 2012 with pneumonia and associated small hydropneumothorax. This responded well to antibiotic therapy and followup chest x-ray in February showed resolution of these changes. No Known Allergies  Current Outpatient Prescriptions on File Prior to Visit  Medication Sig Dispense Refill  . allopurinol (ZYLOPRIM) 300 MG tablet Take 300 mg by mouth daily.       Marland Kitchen aspirin 81 MG tablet Take 81 mg by mouth daily.        Marland Kitchen diltiazem (DILACOR XR) 240 MG 24 hr capsule Take 240 mg by mouth daily.       . furosemide (LASIX) 40 MG tablet Take 40 mg by mouth daily.        Marland Kitchen glyBURIDE (DIABETA) 2.5 MG tablet Take 2.5 mg by mouth daily.        Marland Kitchen lisinopril (PRINIVIL,ZESTRIL) 10 MG tablet Take 10 mg by mouth daily.       . potassium chloride SA (K-DUR,KLOR-CON) 20 MEQ tablet Take 20 mEq by mouth daily.        Marland Kitchen albuterol (PROVENTIL) (5 MG/ML) 0.5% nebulizer solution Take 2.5 mg by nebulization 4 (four) times daily.          Past Medical History  Diagnosis Date  . COPD (chronic obstructive pulmonary disease)   . AF (atrial fibrillation)   . Hypertension   . Gout   . Arthritis   . Pneumonia     history of pneumonia  . Small bowel obstruction   . Renal insufficiency   . Aortic stenosis   . Pneumonia 10/12  . Hydropneumothorax     Past Surgical History  Procedure Date  . Rotator cuff repair   . Cardiac valve replacement     aortic valve with a tissue prosthesis and left sided maze proc  . S/p rewiring of sternum for dehiscence   . Gallbladder surgery   . US echocardiography 01-03-11    EF 55-60%  . Cardiovascular stress test 01-26-09    EF 67%  . Coronary angioplasty     Family History  Problem Relation Age of Onset  . Hypertension Mother   . Stroke Mother    . Heart disease Sister   . Kidney disease Sister     History   Social History  . Marital Status: Married    Spouse Name: N/A    Number of Children: N/A  . Years of Education: N/A   Occupational History  . Not on file.   Social History Main Topics  . Smoking status: Former Smoker    Types: Cigarettes    Quit date: 01/18/2005  . Smokeless tobacco: Not on file  . Alcohol Use: No  . Drug Use: No  . Sexually Active: Not on file   Other Topics Concern  . Not on file   Social History Narrative  . No narrative on file    ROS  as noted in history of present illness. All other systems were reviewed and are negative.  PHYSICAL EXAM BP 130/78  Pulse 78  Ht 5\' 6"  (1.676 m)  Wt 185 lb (83.915 kg)  BMI 29.86 kg/m2 The patient is alert and oriented x 3.  The mood and affect are normal.  The skin is warm and dry.  Color is normal.  The HEENT exam reveals that the sclera are nonicteric.  The mucous membranes are moist.  The carotids are 2+ without bruits.  There is no thyromegaly.  There is no JVD.  The lungs are clear.  The chest wall is non tender.  The heart exam reveals a regular rate with a normal S1 and S2.  There is a soft aortic outflow murmur. There is no gallop.  The PMI is not displaced.   Abdominal exam reveals good bowel sounds.  There is no guarding or rebound.  There is no hepatosplenomegaly or tenderness.  There are no masses.  Exam of the legs reveal no clubbing, cyanosis, or edema.  The legs are without rashes.  The distal pulses are intact.  Cranial nerves II - XII are intact.  Motor and sensory functions are intact.  The gait is normal.  Laboratory data: ECG today shows normal sinus rhythm with occasional PAC. There is low QRS voltage.  ASSESSMENT AND PLAN

## 2012-04-01 NOTE — Patient Instructions (Signed)
Continue your current medication  Avoid salt in your diet. Get regular aerobic activity.  I will see you again in one year.

## 2012-04-01 NOTE — Assessment & Plan Note (Signed)
No evidence of recurrent atrial fibrillation. We will continue with his diltiazem and ACE inhibitor.

## 2012-05-27 ENCOUNTER — Other Ambulatory Visit (HOSPITAL_COMMUNITY): Payer: Self-pay

## 2012-06-05 ENCOUNTER — Ambulatory Visit (HOSPITAL_COMMUNITY)
Admission: RE | Admit: 2012-06-05 | Discharge: 2012-06-05 | Disposition: A | Discharge status: Home / Self Care | Payer: Medicare Other | Source: Ambulatory Visit | Attending: Pulmonary Disease | Admitting: Pulmonary Disease

## 2012-06-05 DIAGNOSIS — R0602 Shortness of breath: Secondary | ICD-10-CM | POA: Insufficient documentation

## 2012-06-05 MED ORDER — ALBUTEROL SULFATE (5 MG/ML) 0.5% IN NEBU
2.5000 mg | INHALATION_SOLUTION | Freq: Once | RESPIRATORY_TRACT | Status: AC
  Administered 2012-06-05: 2.5 mg via RESPIRATORY_TRACT

## 2012-06-08 NOTE — Procedures (Signed)
NAME:  [name] [name], [name] [name]               ACCOUNT NO.:  [id number]  MEDICAL RECORD NO.:  [id number]  LOCATION:  RESP                          FACILITY:  [facility]  PHYSICIAN:  Javiana Anwar L. [name], M.D.DATE OF BIRTH:  [birth year]  DATE OF PROCEDURE: DATE OF DISCHARGE:  06/05/2012                           PULMONARY FUNCTION TEST   Reason for pulmonary function testing is shortness of breath. 1. Spirometry shows a severe ventilatory defect with evidence of     airflow obstruction. 2. Lung volumes show air trapping. 3. DLCO is severely reduced, but does correct somewhat when volume is     accounted. 4. Airway resistance is elevated confirming the diagnosis of airflow     obstruction. 5. There is no significant bronchodilator improvement. 6. This is consistent with COPD.     [name] L. [name], M.D.     ELH/MEDQ  D:  06/08/2012  T:  06/08/2012  Job:  [id number]

## 2012-06-11 LAB — PULMONARY FUNCTION TEST

## 2012-09-29 ENCOUNTER — Encounter (HOSPITAL_COMMUNITY): Payer: Self-pay | Admitting: Pharmacy Technician

## 2012-09-29 ENCOUNTER — Other Ambulatory Visit: Payer: Self-pay

## 2012-09-29 ENCOUNTER — Encounter (HOSPITAL_COMMUNITY)
Admission: RE | Admit: 2012-09-29 | Discharge: 2012-09-29 | Payer: Medicare Other | Source: Ambulatory Visit | Attending: Ophthalmology | Admitting: Ophthalmology

## 2012-09-29 ENCOUNTER — Encounter (HOSPITAL_COMMUNITY): Payer: Self-pay

## 2012-09-29 LAB — BASIC METABOLIC PANEL
CO2: 27 mEq/L (ref 19–32)
Calcium: 9.3 mg/dL (ref 8.4–10.5)
GFR calc non Af Amer: 69 mL/min — ABNORMAL LOW (ref >90)
Glucose, Bld: 75 mg/dL (ref 70–99)
Potassium: 4.3 mEq/L (ref 3.5–5.1)
Sodium: 139 mEq/L (ref 135–145)

## 2012-09-29 NOTE — Patient Instructions (Addendum)
Your procedure is scheduled on: 10/02/2012  Report to [facility] at  930       AM.  Call this number if you have problems the morning of surgery: [phone]   Do not eat food or drink liquids :After Midnight.      Take these medicines the morning of surgery with A SIP OF WATER:allopurinol,dilacor,lisinopril. Take your flovent and proventil before you come.   Do not wear jewelry, make-up or nail polish.  Do not wear lotions, powders, or perfumes. You may wear deodorant.  Do not shave 48 hours prior to surgery.  Do not bring valuables to the hospital.  Contacts, dentures or bridgework may not be worn into surgery.  Leave suitcase in the car. After surgery it may be brought to your room.  For patients admitted to the hospital, checkout time is 11:00 AM the day of discharge.   Patients discharged the day of surgery will not be allowed to drive home.  :     Please read over the following fact sheets that you were given: Coughing and Deep Breathing, Surgical Site Infection Prevention, Anesthesia Post-op Instructions and Care and Recovery After Surgery    Cataract A cataract is a clouding of the lens of the eye. When a lens becomes cloudy, vision is reduced based on the degree and nature of the clouding. Many cataracts reduce vision to some degree. Some cataracts make people more near-sighted as they develop. Other cataracts increase glare. Cataracts that are ignored and become worse can sometimes look white. The white color can be seen through the pupil. CAUSES   Aging. However, cataracts may occur at any age, even in newborns.   Certain drugs.   Trauma to the eye.   Certain diseases such as diabetes.   Specific eye diseases such as chronic inflammation inside the eye or a sudden attack of a rare form of glaucoma.   Inherited or acquired medical problems.  SYMPTOMS   Gradual, progressive drop in vision in the affected eye.   Severe, rapid visual loss. This most often happens when  trauma is the cause.  DIAGNOSIS  To detect a cataract, an eye doctor examines the lens. Cataracts are best diagnosed with an exam of the eyes with the pupils enlarged (dilated) by drops.  TREATMENT  For an early cataract, vision may improve by using different eyeglasses or stronger lighting. If that does not help your vision, surgery is the only effective treatment. A cataract needs to be surgically removed when vision loss interferes with your everyday activities, such as driving, reading, or watching TV. A cataract may also have to be removed if it prevents examination or treatment of another eye problem. Surgery removes the cloudy lens and usually replaces it with a substitute lens (intraocular lens, IOL).  At a time when both you and your doctor agree, the cataract will be surgically removed. If you have cataracts in both eyes, only one is usually removed at a time. This allows the operated eye to heal and be out of danger from any possible problems after surgery (such as infection or poor wound healing). In rare cases, a cataract may be doing damage to your eye. In these cases, your caregiver may advise surgical removal right away. The vast majority of people who have cataract surgery have better vision afterward. HOME CARE INSTRUCTIONS  If you are not planning surgery, you may be asked to do the following:  Use different eyeglasses.   Use stronger or brighter lighting.  Ask your eye doctor about reducing your medicine dose or changing medicines if it is thought that a medicine caused your cataract. Changing medicines does not make the cataract go away on its own.   Become familiar with your surroundings. Poor vision can lead to injury. Avoid bumping into things on the affected side. You are at a higher risk for tripping or falling.   Exercise extreme care when driving or operating machinery.   Wear sunglasses if you are sensitive to bright light or experiencing problems with glare.  SEEK  IMMEDIATE MEDICAL CARE IF:   You have a worsening or sudden vision loss.   You notice redness, swelling, or increasing pain in the eye.   You have a fever.  Document Released: 12/17/2005 Document Revised: 12/06/2011 Document Reviewed: 08/10/2011 [facility] Patient Information 2012 [place], [place].PATIENT INSTRUCTIONS POST-ANESTHESIA  IMMEDIATELY FOLLOWING SURGERY:  Do not drive or operate machinery for the first twenty four hours after surgery.  Do not make any important decisions for twenty four hours after surgery or while taking narcotic pain medications or sedatives.  If you develop intractable nausea and vomiting or a severe headache please notify your doctor immediately.  FOLLOW-UP:  Please make an appointment with your surgeon as instructed. You do not need to follow up with anesthesia unless specifically instructed to do so.  WOUND CARE INSTRUCTIONS (if applicable):  Keep a dry clean dressing on the anesthesia/puncture wound site if there is drainage.  Once the wound has quit draining you may leave it open to air.  Generally you should leave the bandage intact for twenty four hours unless there is drainage.  If the epidural site drains for more than 36-48 hours please call the anesthesia department.  QUESTIONS?:  Please feel free to call your physician or the hospital operator if you have any questions, and they will be happy to assist you.

## 2012-10-01 MED ORDER — NEOMYCIN-POLYMYXIN-DEXAMETH 3.5-10000-0.1 OP OINT
TOPICAL_OINTMENT | OPHTHALMIC | Status: AC
  Filled 2012-10-01: qty 3.5

## 2012-10-01 MED ORDER — TETRACAINE HCL 0.5 % OP SOLN
OPHTHALMIC | Status: AC
  Filled 2012-10-01: qty 2

## 2012-10-01 MED ORDER — PHENYLEPHRINE HCL 2.5 % OP SOLN
OPHTHALMIC | Status: AC
  Filled 2012-10-01: qty 2

## 2012-10-01 MED ORDER — LIDOCAINE HCL (PF) 1 % IJ SOLN
INTRAMUSCULAR | Status: AC
  Filled 2012-10-01: qty 2

## 2012-10-01 MED ORDER — LIDOCAINE HCL 3.5 % OP GEL
OPHTHALMIC | Status: AC
  Filled 2012-10-01: qty 5

## 2012-10-01 MED ORDER — CYCLOPENTOLATE HCL 1 % OP SOLN
OPHTHALMIC | Status: AC
  Filled 2012-10-01: qty 2

## 2012-10-02 ENCOUNTER — Encounter (HOSPITAL_COMMUNITY)
Admission: RE | Disposition: A | Discharge status: Home / Self Care | Payer: Self-pay | Source: Ambulatory Visit | Attending: Ophthalmology

## 2012-10-02 ENCOUNTER — Encounter (HOSPITAL_COMMUNITY): Payer: Self-pay | Admitting: Anesthesiology

## 2012-10-02 ENCOUNTER — Ambulatory Visit (HOSPITAL_COMMUNITY)
Admission: RE | Admit: 2012-10-02 | Discharge: 2012-10-02 | Disposition: A | Discharge status: Home / Self Care | Payer: Medicare Other | Source: Ambulatory Visit | Attending: Ophthalmology | Admitting: Ophthalmology

## 2012-10-02 ENCOUNTER — Encounter (HOSPITAL_COMMUNITY): Payer: Self-pay | Admitting: *Deleted

## 2012-10-02 ENCOUNTER — Ambulatory Visit (HOSPITAL_COMMUNITY): Payer: Medicare Other | Admitting: Anesthesiology

## 2012-10-02 DIAGNOSIS — E119 Type 2 diabetes mellitus without complications: Secondary | ICD-10-CM | POA: Insufficient documentation

## 2012-10-02 DIAGNOSIS — Z79899 Other long term (current) drug therapy: Secondary | ICD-10-CM | POA: Insufficient documentation

## 2012-10-02 DIAGNOSIS — Z01812 Encounter for preprocedural laboratory examination: Secondary | ICD-10-CM | POA: Insufficient documentation

## 2012-10-02 DIAGNOSIS — H2510 Age-related nuclear cataract, unspecified eye: Secondary | ICD-10-CM | POA: Insufficient documentation

## 2012-10-02 DIAGNOSIS — I10 Essential (primary) hypertension: Secondary | ICD-10-CM | POA: Insufficient documentation

## 2012-10-02 DIAGNOSIS — J4489 Other specified chronic obstructive pulmonary disease: Secondary | ICD-10-CM | POA: Insufficient documentation

## 2012-10-02 DIAGNOSIS — Z951 Presence of aortocoronary bypass graft: Secondary | ICD-10-CM | POA: Insufficient documentation

## 2012-10-02 DIAGNOSIS — J449 Chronic obstructive pulmonary disease, unspecified: Secondary | ICD-10-CM | POA: Insufficient documentation

## 2012-10-02 DIAGNOSIS — Z01810 Encounter for preprocedural cardiovascular examination: Secondary | ICD-10-CM | POA: Insufficient documentation

## 2012-10-02 HISTORY — PX: CATARACT EXTRACTION W/PHACO: SHX586

## 2012-10-02 SURGERY — PHACOEMULSIFICATION, CATARACT, WITH IOL INSERTION
Anesthesia: Monitor Anesthesia Care | Site: Eye | Laterality: Right | Wound class: Clean

## 2012-10-02 MED ORDER — EPINEPHRINE HCL 1 MG/ML IJ SOLN
INTRAMUSCULAR | Status: AC
  Filled 2012-10-02: qty 1

## 2012-10-02 MED ORDER — MIDAZOLAM HCL 2 MG/2ML IJ SOLN
1.0000 mg | INTRAMUSCULAR | Status: DC | PRN
  Administered 2012-10-02: 2 mg via INTRAVENOUS

## 2012-10-02 MED ORDER — BSS IO SOLN
INTRAOCULAR | Status: DC | PRN
  Administered 2012-10-02: 15 mL via INTRAOCULAR

## 2012-10-02 MED ORDER — CYCLOPENTOLATE-PHENYLEPHRINE 0.2-1 % OP SOLN: 1.0000 [drp] | OPHTHALMIC | Status: DC

## 2012-10-02 MED ORDER — EPINEPHRINE HCL 1 MG/ML IJ SOLN
INTRAOCULAR | Status: DC | PRN
  Administered 2012-10-02: 11:00:00

## 2012-10-02 MED ORDER — LACTATED RINGERS IV SOLN
INTRAVENOUS | Status: DC
  Administered 2012-10-02: 1000 mL via INTRAVENOUS

## 2012-10-02 MED ORDER — MIDAZOLAM HCL 2 MG/2ML IJ SOLN
INTRAMUSCULAR | Status: AC
  Filled 2012-10-02: qty 2

## 2012-10-02 MED ORDER — LIDOCAINE HCL 3.5 % OP GEL
1.0000 "application " | Freq: Once | OPHTHALMIC | Status: AC
  Administered 2012-10-02: 1 via OPHTHALMIC

## 2012-10-02 MED ORDER — PHENYLEPHRINE HCL 2.5 % OP SOLN
1.0000 [drp] | OPHTHALMIC | Status: AC
  Administered 2012-10-02 (×3): 1 [drp] via OPHTHALMIC

## 2012-10-02 MED ORDER — LIDOCAINE HCL (PF) 1 % IJ SOLN
INTRAMUSCULAR | Status: DC | PRN
  Administered 2012-10-02: .3 mL

## 2012-10-02 MED ORDER — LIDOCAINE 3.5 % OP GEL OPTIME - NO CHARGE
OPHTHALMIC | Status: DC | PRN
  Administered 2012-10-02: 2 [drp] via OPHTHALMIC

## 2012-10-02 MED ORDER — NA HYALUR & NA CHOND-NA HYALUR 0.55-0.5 ML IO KIT
PACK | INTRAOCULAR | Status: DC | PRN
  Administered 2012-10-02: 1 via OPHTHALMIC

## 2012-10-02 MED ORDER — POVIDONE-IODINE 5 % OP SOLN
OPHTHALMIC | Status: DC | PRN
  Administered 2012-10-02: 1 via OPHTHALMIC

## 2012-10-02 MED ORDER — CYCLOPENTOLATE HCL 1 % OP SOLN
1.0000 [drp] | OPHTHALMIC | Status: AC
  Administered 2012-10-02 (×3): 1 [drp] via OPHTHALMIC

## 2012-10-02 MED ORDER — NEOMYCIN-POLYMYXIN-DEXAMETH 0.1 % OP OINT
TOPICAL_OINTMENT | OPHTHALMIC | Status: DC | PRN
  Administered 2012-10-02: 1 via OPHTHALMIC

## 2012-10-02 MED ORDER — TETRACAINE HCL 0.5 % OP SOLN
1.0000 [drp] | OPHTHALMIC | Status: AC
  Administered 2012-10-02 (×3): 1 [drp] via OPHTHALMIC

## 2012-10-02 SURGICAL SUPPLY — 32 items

## 2012-10-02 NOTE — Anesthesia Preprocedure Evaluation (Signed)
Anesthesia Evaluation  Patient identified by MRN, date of birth, ID band Patient awake    Reviewed: Allergy & Precautions, H&P , NPO status , Patient's Chart, lab work & pertinent test results  Airway Mallampati: I      Dental  (+) Edentulous Upper and Edentulous Lower   Pulmonary shortness of breath, with exertion and Long-Term Oxygen Therapy, asthma , COPD COPD inhaler,  - rhonchi  + decreased breath sounds      Cardiovascular hypertension, - angina+ CAD and + CABG + dysrhythmias Atrial Fibrillation Rhythm:Regular Rate:Normal     Neuro/Psych    GI/Hepatic   Endo/Other  diabetes, Well Controlled, Type 2  Renal/GU Renal InsufficiencyRenal disease     Musculoskeletal   Abdominal   Peds  Hematology   Anesthesia Other Findings   Reproductive/Obstetrics                           Anesthesia Physical Anesthesia Plan  ASA: III  Anesthesia Plan: MAC   Post-op Pain Management:    Induction: Intravenous  Airway Management Planned: Nasal Cannula  Additional Equipment:   Intra-op Plan:   Post-operative Plan: Extubation in OR  Informed Consent: I have reviewed the patients History and Physical, chart, labs and discussed the procedure including the risks, benefits and alternatives for the proposed anesthesia with the patient or authorized representative who has indicated his/her understanding and acceptance.     Plan Discussed with:   Anesthesia Plan Comments:         Anesthesia Quick Evaluation  

## 2012-10-02 NOTE — Anesthesia Postprocedure Evaluation (Signed)
  Anesthesia Post-op Note  Patient: [name] [name]  Procedure(s) Performed: Procedure(s) (LRB) with comments: CATARACT EXTRACTION PHACO AND INTRAOCULAR LENS PLACEMENT (IOC) (Right) - CDE 16.68  Patient Location: PACU and Short Stay  Anesthesia Type: MAC  Level of Consciousness: awake, alert  and oriented  Airway and Oxygen Therapy: Patient Spontanous Breathing and Patient connected to face mask oxygen  Post-op Pain: none  Post-op Assessment: Post-op Vital signs reviewed, Patient's Cardiovascular Status Stable, Respiratory Function Stable, Patent Airway and No signs of Nausea or vomiting  Post-op Vital Signs: Reviewed and stable  Complications: No apparent anesthesia complications

## 2012-10-02 NOTE — H&P (Signed)
I have reviewed the H&P, the patient was re-examined, and I have identified no interval changes in medical condition and plan of care since the history and physical of record  

## 2012-10-02 NOTE — Transfer of Care (Signed)
Immediate Anesthesia Transfer of Care Note  Patient: [name] [name]  Procedure(s) Performed: Procedure(s) (LRB) with comments: CATARACT EXTRACTION PHACO AND INTRAOCULAR LENS PLACEMENT (IOC) (Right) - CDE 16.68  Patient Location: PACU and Short Stay  Anesthesia Type: MAC  Level of Consciousness: awake and alert   Airway & Oxygen Therapy: Patient Spontanous Breathing  Post-op Assessment: Post -op Vital signs reviewed and stable  Post vital signs: Reviewed  Complications: No apparent anesthesia complications

## 2012-10-02 NOTE — Brief Op Note (Signed)
Pre-Op Dx: Cataract OD Post-Op Dx: Cataract OD Surgeon: Tabor Bartram Anesthesia: Topical with MAC Surgery: Cataract Extraction with Intraocular lens Implant OD Implant: B&L enVista Specimen: None Complications: None 

## 2012-10-03 NOTE — Op Note (Signed)
NAME:  [name] [name], [name] [name]               ACCOUNT NO.:  [id number]  MEDICAL RECORD NO.:  [id number]  LOCATION:  APPO                          FACILITY:  [facility]  PHYSICIAN:  Susanne Greenhouse, MD       DATE OF BIRTH:  February 09, 1936  DATE OF PROCEDURE:  10/02/2012 DATE OF DISCHARGE:  10/02/2012                              OPERATIVE REPORT   PREOPERATIVE DIAGNOSIS:  Nuclear cataract, right eye, diagnosis code 366.16.  POSTOPERATIVE DIAGNOSIS:  Nuclear cataract, right eye, diagnosis code 366.16.  SURGEON:  Susanne Greenhouse, MD  OPERATION PERFORMED:  Phacoemulsification with posterior chamber intraocular lens implantation, right eye.  ANESTHESIA:  Topical with IV sedation.  OPERATIVE SUMMARY:  In the preoperative area, dilating drops were placed into the right eye.  The patient was then brought into the operating room where he was placed under topical anesthesia and IV sedation.  The eye was then prepped and draped.  Beginning with a 75 blade, a paracentesis port was made at the surgeon's 2 o'clock position.  The anterior chamber was then filled with a 1% nonpreserved lidocaine solution with epinephrine.  This was followed by Viscoat to deepen the chamber.  A small fornix-based peritomy was performed superiorly.  Next, a single iris hook was placed through the limbus superiorly.  A 2.4-mm keratome blade was then used to make a clear corneal incision over the iris hook.  A bent cystotome needle and Utrata forceps were used to create a continuous tear capsulotomy.  Hydrodissection was performed using balanced salt solution on a fine cannula.  The lens nucleus was then removed using phacoemulsification in a quadrant cracking technique. The cortical material was then removed with irrigation and aspiration. The capsular bag and anterior chamber were refilled with Provisc.  The wound was widened to approximately 3 mm and a posterior chamber intraocular lens was placed into the capsular bag without  difficulty using an [employer] lens injecting system.  A single 10-0 nylon suture was then used to close the incision as well as stromal hydration. The Provisc was removed from the anterior chamber and capsular bag with irrigation and aspiration.  At this point, the wounds were tested for leak, which were negative.  The anterior chamber remained deep and stable.  The patient tolerated the procedure well.  There were no operative complications, and he awoke from topical anesthesia and IV sedation without problem.  No surgical specimens.  Prosthetic device used is a Actuary enVista posterior chamber lens, model MX60, power of 25.0, serial number is [id number].          ______________________________ Susanne Greenhouse, MD     KEH/MEDQ  D:  10/02/2012  T:  10/03/2012  Job:  [id number]

## 2012-10-06 ENCOUNTER — Encounter (HOSPITAL_COMMUNITY): Payer: Self-pay | Admitting: Ophthalmology

## 2012-10-09 ENCOUNTER — Encounter (HOSPITAL_COMMUNITY): Payer: Self-pay | Admitting: Pharmacy Technician

## 2012-10-20 ENCOUNTER — Other Ambulatory Visit (HOSPITAL_COMMUNITY): Payer: Medicare Other

## 2012-10-21 ENCOUNTER — Encounter (HOSPITAL_COMMUNITY)
Admission: RE | Admit: 2012-10-21 | Discharge: 2012-10-21 | Disposition: A | Discharge status: Home / Self Care | Payer: Medicare Other | Source: Ambulatory Visit | Attending: Ophthalmology | Admitting: Ophthalmology

## 2012-10-21 MED ORDER — FENTANYL CITRATE 0.05 MG/ML IJ SOLN: 25.0000 ug | INTRAMUSCULAR | Status: DC | PRN

## 2012-10-21 MED ORDER — ONDANSETRON HCL 4 MG/2ML IJ SOLN: 4.0000 mg | Freq: Once | INTRAMUSCULAR | Status: AC | PRN

## 2012-10-24 MED ORDER — NEOMYCIN-POLYMYXIN-DEXAMETH 3.5-10000-0.1 OP OINT
TOPICAL_OINTMENT | OPHTHALMIC | Status: AC
  Filled 2012-10-24: qty 3.5

## 2012-10-24 MED ORDER — LIDOCAINE HCL (PF) 1 % IJ SOLN
INTRAMUSCULAR | Status: AC
  Filled 2012-10-24: qty 2

## 2012-10-24 MED ORDER — LIDOCAINE HCL 3.5 % OP GEL
OPHTHALMIC | Status: AC
  Filled 2012-10-24: qty 5

## 2012-10-24 MED ORDER — CYCLOPENTOLATE-PHENYLEPHRINE 0.2-1 % OP SOLN
OPHTHALMIC | Status: AC
  Filled 2012-10-24: qty 2

## 2012-10-24 MED ORDER — TETRACAINE HCL 0.5 % OP SOLN
OPHTHALMIC | Status: AC
  Filled 2012-10-24: qty 2

## 2012-10-24 MED ORDER — PHENYLEPHRINE HCL 2.5 % OP SOLN
OPHTHALMIC | Status: AC
  Filled 2012-10-24: qty 2

## 2012-10-27 ENCOUNTER — Encounter (HOSPITAL_COMMUNITY): Payer: Self-pay | Admitting: *Deleted

## 2012-10-27 ENCOUNTER — Encounter (HOSPITAL_COMMUNITY)
Admission: RE | Disposition: A | Discharge status: Home / Self Care | Payer: Self-pay | Source: Ambulatory Visit | Attending: Ophthalmology

## 2012-10-27 ENCOUNTER — Encounter (HOSPITAL_COMMUNITY): Payer: Self-pay | Admitting: Anesthesiology

## 2012-10-27 ENCOUNTER — Ambulatory Visit (HOSPITAL_COMMUNITY): Payer: Medicare Other | Admitting: Anesthesiology

## 2012-10-27 ENCOUNTER — Ambulatory Visit (HOSPITAL_COMMUNITY)
Admission: RE | Admit: 2012-10-27 | Discharge: 2012-10-27 | Disposition: A | Discharge status: Home / Self Care | Payer: Medicare Other | Source: Ambulatory Visit | Attending: Ophthalmology | Admitting: Ophthalmology

## 2012-10-27 DIAGNOSIS — H2510 Age-related nuclear cataract, unspecified eye: Secondary | ICD-10-CM | POA: Insufficient documentation

## 2012-10-27 DIAGNOSIS — Z951 Presence of aortocoronary bypass graft: Secondary | ICD-10-CM | POA: Insufficient documentation

## 2012-10-27 DIAGNOSIS — E119 Type 2 diabetes mellitus without complications: Secondary | ICD-10-CM | POA: Insufficient documentation

## 2012-10-27 DIAGNOSIS — J449 Chronic obstructive pulmonary disease, unspecified: Secondary | ICD-10-CM | POA: Insufficient documentation

## 2012-10-27 DIAGNOSIS — J4489 Other specified chronic obstructive pulmonary disease: Secondary | ICD-10-CM | POA: Insufficient documentation

## 2012-10-27 DIAGNOSIS — I10 Essential (primary) hypertension: Secondary | ICD-10-CM | POA: Insufficient documentation

## 2012-10-27 DIAGNOSIS — Z01812 Encounter for preprocedural laboratory examination: Secondary | ICD-10-CM | POA: Insufficient documentation

## 2012-10-27 HISTORY — PX: CATARACT EXTRACTION W/PHACO: SHX586

## 2012-10-27 SURGERY — PHACOEMULSIFICATION, CATARACT, WITH IOL INSERTION
Anesthesia: Monitor Anesthesia Care | Site: Eye | Laterality: Left | Wound class: Clean

## 2012-10-27 MED ORDER — POVIDONE-IODINE 5 % OP SOLN
OPHTHALMIC | Status: DC | PRN
  Administered 2012-10-27: 1 via OPHTHALMIC

## 2012-10-27 MED ORDER — CYCLOPENTOLATE-PHENYLEPHRINE 0.2-1 % OP SOLN
1.0000 [drp] | OPHTHALMIC | Status: AC
  Administered 2012-10-27 (×3): 1 [drp] via OPHTHALMIC

## 2012-10-27 MED ORDER — MIDAZOLAM HCL 2 MG/2ML IJ SOLN
1.0000 mg | INTRAMUSCULAR | Status: DC | PRN
  Administered 2012-10-27: 2 mg via INTRAVENOUS

## 2012-10-27 MED ORDER — EPINEPHRINE HCL 1 MG/ML IJ SOLN
INTRAOCULAR | Status: DC | PRN
  Administered 2012-10-27: 08:00:00

## 2012-10-27 MED ORDER — MIDAZOLAM HCL 2 MG/2ML IJ SOLN
INTRAMUSCULAR | Status: AC
  Filled 2012-10-27: qty 2

## 2012-10-27 MED ORDER — ONDANSETRON HCL 4 MG/2ML IJ SOLN: 4.0000 mg | Freq: Once | INTRAMUSCULAR | Status: DC | PRN

## 2012-10-27 MED ORDER — NA HYALUR & NA CHOND-NA HYALUR 0.55-0.5 ML IO KIT
PACK | INTRAOCULAR | Status: DC | PRN
  Administered 2012-10-27: 1 via OPHTHALMIC

## 2012-10-27 MED ORDER — TETRACAINE HCL 0.5 % OP SOLN
1.0000 [drp] | OPHTHALMIC | Status: AC
  Administered 2012-10-27 (×3): 1 [drp] via OPHTHALMIC

## 2012-10-27 MED ORDER — LIDOCAINE HCL (PF) 1 % IJ SOLN
INTRAMUSCULAR | Status: DC | PRN
  Administered 2012-10-27: 4 mL

## 2012-10-27 MED ORDER — LIDOCAINE HCL 3.5 % OP GEL
1.0000 "application " | Freq: Once | OPHTHALMIC | Status: AC
  Administered 2012-10-27: 1 via OPHTHALMIC

## 2012-10-27 MED ORDER — PHENYLEPHRINE HCL 2.5 % OP SOLN
1.0000 [drp] | OPHTHALMIC | Status: AC
  Administered 2012-10-27 (×3): 1 [drp] via OPHTHALMIC

## 2012-10-27 MED ORDER — BSS IO SOLN
INTRAOCULAR | Status: DC | PRN
  Administered 2012-10-27: 15 mL via INTRAOCULAR

## 2012-10-27 MED ORDER — FENTANYL CITRATE 0.05 MG/ML IJ SOLN
25.0000 ug | INTRAMUSCULAR | Status: DC | PRN
Start: 2012-10-27 — End: 2012-10-27

## 2012-10-27 MED ORDER — LACTATED RINGERS IV SOLN
INTRAVENOUS | Status: DC
  Administered 2012-10-27: 07:00:00 via INTRAVENOUS

## 2012-10-27 SURGICAL SUPPLY — 31 items
CAPSULAR TENSION RING-AMO (OPHTHALMIC RELATED) IMPLANT
CLOTH BEACON ORANGE TIMEOUT ST (SAFETY) ×2 IMPLANT
EYE SHIELD UNIVERSAL CLEAR (GAUZE/BANDAGES/DRESSINGS) ×2 IMPLANT
GLOVE BIO SURGEON STRL SZ 6.5 (GLOVE) IMPLANT
GLOVE BIOGEL PI IND STRL 6.5 (GLOVE) ×1 IMPLANT
GLOVE BIOGEL PI IND STRL 7.0 (GLOVE) IMPLANT
GLOVE BIOGEL PI IND STRL 7.5 (GLOVE) IMPLANT
GLOVE BIOGEL PI INDICATOR 6.5 (GLOVE) ×1
GLOVE BIOGEL PI INDICATOR 7.0 (GLOVE)
GLOVE BIOGEL PI INDICATOR 7.5 (GLOVE)
GLOVE ECLIPSE 6.5 STRL STRAW (GLOVE) IMPLANT
GLOVE ECLIPSE 7.0 STRL STRAW (GLOVE) IMPLANT
GLOVE ECLIPSE 7.5 STRL STRAW (GLOVE) IMPLANT
GLOVE EXAM NITRILE LRG STRL (GLOVE) IMPLANT
GLOVE EXAM NITRILE MD LF STRL (GLOVE) ×2 IMPLANT
GLOVE SKINSENSE NS SZ6.5 (GLOVE)
GLOVE SKINSENSE NS SZ7.0 (GLOVE)
GLOVE SKINSENSE STRL SZ6.5 (GLOVE) IMPLANT
GLOVE SKINSENSE STRL SZ7.0 (GLOVE) IMPLANT
KIT VITRECTOMY (OPHTHALMIC RELATED) IMPLANT
PAD ARMBOARD 7.5X6 YLW CONV (MISCELLANEOUS) ×2 IMPLANT
PROC W NO LENS (INTRAOCULAR LENS)
PROC W SPEC LENS (INTRAOCULAR LENS)
PROCESS W NO LENS (INTRAOCULAR LENS) IMPLANT
PROCESS W SPEC LENS (INTRAOCULAR LENS) IMPLANT
RING MALYGIN (MISCELLANEOUS) IMPLANT
SIGHTPATH CAT PROC W REG LENS (Ophthalmic Related) ×2 IMPLANT
SYR TB 1ML LL NO SAFETY (SYRINGE) ×2 IMPLANT
TAPE CLOTH SOFT 2X10 (GAUZE/BANDAGES/DRESSINGS) ×2 IMPLANT
VISCOELASTIC ADDITIONAL (OPHTHALMIC RELATED) IMPLANT
WATER STERILE IRR 250ML POUR (IV SOLUTION) ×2 IMPLANT

## 2012-10-27 NOTE — Anesthesia Postprocedure Evaluation (Signed)
  Anesthesia Post-op Note  Patient: [name] [name]  Procedure(s) Performed: Procedure(s) (LRB) with comments: CATARACT EXTRACTION PHACO AND INTRAOCULAR LENS PLACEMENT (IOC) (Left) - CDE:16.89  Patient Location: PACU and Short Stay  Anesthesia Type: MAC   Level of Consciousness: awake, alert , oriented and patient cooperative  Airway and Oxygen Therapy: Patient Spontanous Breathing  Post-op Pain: none  Post-op Assessment: Post-op Vital signs reviewed, Patient's Cardiovascular Status Stable, Respiratory Function Stable, Patent Airway and Pain level controlled  Post-op Vital Signs: Reviewed and stable  Complications: No apparent anesthesia complications

## 2012-10-27 NOTE — Anesthesia Preprocedure Evaluation (Addendum)
Anesthesia Evaluation  Patient identified by MRN, date of birth, ID band Patient awake    Reviewed: Allergy & Precautions, H&P , NPO status , Patient's Chart, lab work & pertinent test results  Airway Mallampati: I      Dental  (+) Edentulous Upper and Edentulous Lower   Pulmonary shortness of breath, with exertion and Long-Term Oxygen Therapy, asthma , COPD COPD inhaler,  - rhonchi  + decreased breath sounds      Cardiovascular hypertension, - angina+ CAD and + CABG + dysrhythmias Atrial Fibrillation Rhythm:Regular Rate:Normal     Neuro/Psych    GI/Hepatic   Endo/Other  diabetes, Well Controlled, Type 2  Renal/GU Renal InsufficiencyRenal disease     Musculoskeletal   Abdominal   Peds  Hematology   Anesthesia Other Findings   Reproductive/Obstetrics                           Anesthesia Physical Anesthesia Plan  ASA: III  Anesthesia Plan: MAC   Post-op Pain Management:    Induction: Intravenous  Airway Management Planned: Nasal Cannula  Additional Equipment:   Intra-op Plan:   Post-operative Plan: Extubation in OR  Informed Consent: I have reviewed the patients History and Physical, chart, labs and discussed the procedure including the risks, benefits and alternatives for the proposed anesthesia with the patient or authorized representative who has indicated his/her understanding and acceptance.     Plan Discussed with:   Anesthesia Plan Comments:         Anesthesia Quick Evaluation

## 2012-10-27 NOTE — Brief Op Note (Signed)
Pre-Op Dx: Cataract OS Post-Op Dx: Cataract OS Surgeon: Analilia Geddis Anesthesia: Topical with MAC Surgery: Cataract Extraction with Intraocular lens Implant OS Implant: B&L enVista Specimen: None Complications: None 

## 2012-10-27 NOTE — Transfer of Care (Signed)
Immediate Anesthesia Transfer of Care Note  Patient: [name] [name]  Procedure(s) Performed: Procedure(s) (LRB) with comments: CATARACT EXTRACTION PHACO AND INTRAOCULAR LENS PLACEMENT (IOC) (Left) - CDE:16.89  Patient Location: PACU and Short Stay  Anesthesia Type:MAC  Level of Consciousness: awake, alert , oriented and patient cooperative  Airway & Oxygen Therapy: Patient Spontanous Breathing  Post-op Assessment: Report given to PACU RN and Post -op Vital signs reviewed and stable  Post vital signs: Reviewed and stable  Complications: No apparent anesthesia complications

## 2012-10-27 NOTE — H&P (Signed)
I have reviewed the H&P, the patient was re-examined, and I have identified no interval changes in medical condition and plan of care since the history and physical of record  

## 2012-10-28 NOTE — Op Note (Signed)
NAME:  STEFAN, MARKARIAN               ACCOUNT NO.:  [id number]  MEDICAL RECORD NO.:  [id number]  LOCATION:  [facility]                          FACILITY:  [facility]  PHYSICIAN:  Susanne Greenhouse, MD       DATE OF BIRTH:  October 10, 1936  DATE OF PROCEDURE:  10/27/2012 DATE OF DISCHARGE:  10/27/2012                              OPERATIVE REPORT   PREOPERATIVE DIAGNOSIS:  Nuclear cataract, left eye, diagnosis code 366.16.  POSTOPERATIVE DIAGNOSIS:  Nuclear cataract, left eye, diagnosis code 366.16.  SURGEON:  Susanne Greenhouse, MD  OPERATION PERFORMED:  Phacoemulsification with posterior chamber intraocular lens implantation, left eye.  ANESTHESIA:   Topical with IV sedation.  OPERATIVE SUMMARY:  In the preoperative area, dilating drops were placed into the left eye.  The patient was then brought into the operating room where she was placed under topical anesthesia and IV sedation.  The eye was then prepped and draped.  Beginning with a 75 blade, a paracentesis port was made at the surgeon's 2 o'clock position.  The anterior chamber was then filled with a 1% nonpreserved lidocaine solution with epinephrine.  This was followed by Viscoat to deepen the chamber.  A small fornix-based peritomy was performed superiorly.  Next, a single iris hook was placed through the limbus superiorly.  A 2.4-mm keratome blade was then used to make a clear corneal incision over the iris hook. A bent cystotome needle and Utrata forceps were used to create a continuous tear capsulotomy.  Hydrodissection was performed using balanced salt solution on a fine cannula.  The lens nucleus was then removed using phacoemulsification in a quadrant cracking technique.  The cortical material was then removed with irrigation and aspiration.  The capsular bag and anterior chamber were refilled with Provisc.  The wound was widened to approximately 3 mm and a posterior chamber intraocular lens was placed into the capsular bag without  difficulty using an [employer] lens injecting system.  A single 10-0 nylon suture was then used to close the incision as well as stromal hydration.  The Provisc was removed from the anterior chamber and capsular bag with irrigation and aspiration.  At this point, the wounds were tested for leak, which were negative.  The anterior chamber remained deep and stable.  The patient tolerated the procedure well.  There were no operative complications, and she awoke from topical anesthesia and IV sedation without problem.  No surgical specimens.  Prosthetic device used is a Actuary enVista posterior chamber lens, model MX60, power of 24.0, serial number is [id number].          ______________________________ Susanne Greenhouse, MD     KEH/MEDQ  D:  10/27/2012  T:  10/28/2012  Job:  [id number]

## 2012-10-29 ENCOUNTER — Encounter (HOSPITAL_COMMUNITY): Payer: Self-pay | Admitting: Ophthalmology

## 2012-10-29 MED ORDER — NEOMYCIN-POLYMYXIN-DEXAMETH 0.1 % OP OINT
TOPICAL_OINTMENT | OPHTHALMIC | Status: AC | PRN
  Administered 2012-10-27: 1

## 2012-12-08 ENCOUNTER — Encounter (HOSPITAL_COMMUNITY)
Admission: RE | Disposition: A | Discharge status: Home / Self Care | Payer: Self-pay | Source: Ambulatory Visit | Attending: Ophthalmology

## 2012-12-08 ENCOUNTER — Ambulatory Visit (HOSPITAL_COMMUNITY): Payer: Medicare Other | Admitting: Anesthesiology

## 2012-12-08 ENCOUNTER — Encounter (HOSPITAL_COMMUNITY): Payer: Self-pay | Admitting: Anesthesiology

## 2012-12-08 ENCOUNTER — Ambulatory Visit (HOSPITAL_COMMUNITY)
Admission: RE | Admit: 2012-12-08 | Discharge: 2012-12-08 | Disposition: A | Discharge status: Home / Self Care | Payer: Medicare Other | Source: Ambulatory Visit | Attending: Ophthalmology | Admitting: Ophthalmology

## 2012-12-08 ENCOUNTER — Encounter (HOSPITAL_COMMUNITY): Payer: Self-pay | Admitting: *Deleted

## 2012-12-08 DIAGNOSIS — I10 Essential (primary) hypertension: Secondary | ICD-10-CM | POA: Insufficient documentation

## 2012-12-08 DIAGNOSIS — E119 Type 2 diabetes mellitus without complications: Secondary | ICD-10-CM | POA: Insufficient documentation

## 2012-12-08 DIAGNOSIS — J4489 Other specified chronic obstructive pulmonary disease: Secondary | ICD-10-CM | POA: Insufficient documentation

## 2012-12-08 DIAGNOSIS — J449 Chronic obstructive pulmonary disease, unspecified: Secondary | ICD-10-CM | POA: Insufficient documentation

## 2012-12-08 DIAGNOSIS — H59029 Cataract (lens) fragments in eye following cataract surgery, unspecified eye: Secondary | ICD-10-CM | POA: Insufficient documentation

## 2012-12-08 SURGERY — REMOVAL OR REPLACEMENT, IOL
Anesthesia: Monitor Anesthesia Care | Site: Eye | Laterality: Right | Wound class: Clean

## 2012-12-08 MED ORDER — EPINEPHRINE HCL 1 MG/ML IJ SOLN
INTRAMUSCULAR | Status: AC
  Filled 2012-12-08: qty 1

## 2012-12-08 MED ORDER — LIDOCAINE HCL (PF) 1 % IJ SOLN
INTRAMUSCULAR | Status: AC
  Filled 2012-12-08: qty 2

## 2012-12-08 MED ORDER — LIDOCAINE HCL (PF) 1 % IJ SOLN
INTRAMUSCULAR | Status: DC | PRN
  Administered 2012-12-08: .8 mL

## 2012-12-08 MED ORDER — LIDOCAINE HCL 3.5 % OP GEL
OPHTHALMIC | Status: AC
  Filled 2012-12-08: qty 5

## 2012-12-08 MED ORDER — NEOMYCIN-POLYMYXIN-DEXAMETH 3.5-10000-0.1 OP OINT
TOPICAL_OINTMENT | OPHTHALMIC | Status: DC | PRN
  Administered 2012-12-08: 1 via OPHTHALMIC

## 2012-12-08 MED ORDER — PROVISC 10 MG/ML IO SOLN
INTRAOCULAR | Status: DC | PRN
  Administered 2012-12-08: 8.5 mg via INTRAOCULAR

## 2012-12-08 MED ORDER — MIDAZOLAM HCL 2 MG/2ML IJ SOLN
INTRAMUSCULAR | Status: AC
  Filled 2012-12-08: qty 2

## 2012-12-08 MED ORDER — LIDOCAINE HCL 3.5 % OP GEL
1.0000 "application " | Freq: Once | OPHTHALMIC | Status: AC
  Administered 2012-12-08: 1 via OPHTHALMIC

## 2012-12-08 MED ORDER — LIDOCAINE 3.5 % OP GEL OPTIME - NO CHARGE
OPHTHALMIC | Status: DC | PRN
  Administered 2012-12-08: 1 [drp] via OPHTHALMIC

## 2012-12-08 MED ORDER — POVIDONE-IODINE 5 % OP SOLN
OPHTHALMIC | Status: DC | PRN
  Administered 2012-12-08: 1 via OPHTHALMIC

## 2012-12-08 MED ORDER — BSS IO SOLN
INTRAOCULAR | Status: DC | PRN
  Administered 2012-12-08: 15 mL via INTRAOCULAR

## 2012-12-08 MED ORDER — NEOMYCIN-POLYMYXIN-DEXAMETH 3.5-10000-0.1 OP OINT
TOPICAL_OINTMENT | OPHTHALMIC | Status: AC
  Filled 2012-12-08: qty 3.5

## 2012-12-08 MED ORDER — LACTATED RINGERS IV SOLN
INTRAVENOUS | Status: DC | PRN
  Administered 2012-12-08: 14:00:00 via INTRAVENOUS

## 2012-12-08 MED ORDER — EPINEPHRINE HCL 1 MG/ML IJ SOLN
INTRAMUSCULAR | Status: DC | PRN
  Administered 2012-12-08: 15:00:00

## 2012-12-08 SURGICAL SUPPLY — 8 items
CLOTH BEACON ORANGE TIMEOUT ST (SAFETY) ×2 IMPLANT
EYE SHIELD UNIVERSAL CLEAR (GAUZE/BANDAGES/DRESSINGS) ×2 IMPLANT
GLOVE BIOGEL PI IND STRL 6.5 (GLOVE) ×1 IMPLANT
GLOVE BIOGEL PI INDICATOR 6.5 (GLOVE) ×1
GLOVE EXAM NITRILE MD LF STRL (GLOVE) ×2 IMPLANT
PAD ARMBOARD 7.5X6 YLW CONV (MISCELLANEOUS) ×2 IMPLANT
TAPE SURG TRANSPORE 1 IN (GAUZE/BANDAGES/DRESSINGS) ×1 IMPLANT
TAPE SURGICAL TRANSPORE 1 IN (GAUZE/BANDAGES/DRESSINGS) ×1

## 2012-12-08 NOTE — Anesthesia Postprocedure Evaluation (Signed)
  Anesthesia Post-op Note  Patient: [name] [name]  Procedure(s) Performed: Procedure(s) (LRB) with comments: REMOVE AND REPLACE LENS (Right) - Removal Nuclear fragment right eye  Patient Location: PACU  Anesthesia Type: MAC  Level of Consciousness: awake, alert , oriented and patient cooperative  Airway and Oxygen Therapy: Patient Spontanous Breathing room air  Post-op Pain: mild  Post-op Assessment: Post-op Vital signs reviewed, Patient's Cardiovascular Status Stable, Respiratory Function Stable, Patent Airway and No signs of Nausea or vomiting  Post-op Vital Signs: Reviewed and stable  Complications: No apparent anesthesia complications

## 2012-12-08 NOTE — Preoperative (Signed)
Beta Blockers   Reason not to administer Beta Blockers:Not Applicable 

## 2012-12-08 NOTE — Anesthesia Procedure Notes (Signed)
Procedure Name: MAC Performed by: ANDRAZA, AMY L Pre-anesthesia Checklist: Patient identified, Patient being monitored, Emergency Drugs available, Timeout performed and Suction available Patient Re-evaluated:Patient Re-evaluated prior to inductionOxygen Delivery Method: Nasal cannula     

## 2012-12-08 NOTE — Anesthesia Preprocedure Evaluation (Addendum)
Anesthesia Evaluation  Patient identified by MRN, date of birth, ID band Patient awake    Reviewed: Allergy & Precautions, H&P , NPO status , Patient's Chart, lab work & pertinent test results, reviewed documented beta blocker date and time   Airway Mallampati: II TM Distance: >3 FB Neck ROM: Full    Dental  (+) Edentulous Upper and Edentulous Lower   Pulmonary pneumonia -, resolved, COPD COPD inhaler and oxygen dependent,  SOB with exertion; Longterm oxygen therapy   + decreased breath sounds      Cardiovascular hypertension, Pt. on medications + CAD and + CABG + dysrhythmias Atrial Fibrillation Rhythm:Regular     Neuro/Psych    GI/Hepatic   Endo/Other  diabetes, Well Controlled, Type 2  Renal/GU Renal InsufficiencyRenal disease     Musculoskeletal   Abdominal   Peds  Hematology   Anesthesia Other Findings   Reproductive/Obstetrics                       Anesthesia Physical Anesthesia Plan  ASA: III  Anesthesia Plan: MAC   Post-op Pain Management:    Induction:   Airway Management Planned:   Additional Equipment:   Intra-op Plan:   Post-operative Plan:   Informed Consent: I have reviewed the patients History and Physical, chart, labs and discussed the procedure including the risks, benefits and alternatives for the proposed anesthesia with the patient or authorized representative who has indicated his/her understanding and acceptance.   Dental advisory given  Plan Discussed with: Anesthesiologist and Surgeon  Anesthesia Plan Comments:         Anesthesia Quick Evaluation

## 2012-12-08 NOTE — Brief Op Note (Signed)
12/08/2012  2:40 PM  PATIENT:  [name] [name]  76 y.o. male  PRE-OPERATIVE DIAGNOSIS:  Nuclear fragment Right eye  POST-OPERATIVE DIAGNOSIS:  Nuclear fragment Right eye  PROCEDURE:  Removal of retained nuclear lens fragment, right eye.  SURGEON:  Surgeon(s) and Role:    * Gemma Payor, MD - Primary   ANESTHESIA:   Topical/intracameral, MAC  LOCAL MEDICATIONS USED:  Lidocaine  SPECIMEN: None  PATIENT DISPOSITION: Discharged home. F/u with Dr. Alto Denver in 1 week.

## 2012-12-08 NOTE — Transfer of Care (Signed)
  Anesthesia Post-op Note  Patient: [name] [name]  Procedure(s) Performed: Procedure(s) (LRB) with comments: REMOVE AND REPLACE LENS (Right) - Removal Nuclear fragment right eye  Patient Location: PACU  Anesthesia Type: MAC  Level of Consciousness: awake, alert , oriented and patient cooperative  Airway and Oxygen Therapy: Patient Spontanous Breathing room air  Post-op Pain: mild  Post-op Assessment: Post-op Vital signs reviewed, Patient's Cardiovascular Status Stable, Respiratory Function Stable, Patent Airway and No signs of Nausea or vomiting  Post-op Vital Signs: Reviewed and stable  Complications: No apparent anesthesia complications  

## 2012-12-09 NOTE — Op Note (Signed)
NAME:  [name] [name], [name] [name]               ACCOUNT NO.:  [id number]  MEDICAL RECORD NO.:  [id number]  LOCATION:  [facility]                          FACILITY:  [facility]  PHYSICIAN:  Susanne Greenhouse, MD       DATE OF BIRTH:  1936-02-12  DATE OF PROCEDURE:  12/08/2012 DATE OF DISCHARGE:  12/08/2012                              OPERATIVE REPORT   PREOPERATIVE DIAGNOSIS:  Retained nuclear lens fragment, right eye, diagnosis code 998.82.  POSTOPERATIVE DIAGNOSIS:  Retained nuclear lens fragment, right eye, diagnosis code 998.82.  OPERATION PERFORMED:  Removal of retained nuclear fragment.  SURGEON:  Susanne Greenhouse, MD  ANESTHESIA:  Topical with monitored anesthesia care.  PROCEDURE CODE:  40981.  INDICATION:  The patient had cataract surgery approximately 6 weeks ago and was discovered to have a small retained nuclear fragment in the right eye causing inflammation and cystoid macular edema.  DESCRIPTION OF THE OPERATION:  In the preoperative holding area, viscous lidocaine was placed into the right eye.  There was no dilation.  The patient was then brought to the operating room where he was prepped and draped.  Beginning with a 75 blade, a paracentesis port was made at the surgeon's 2 o'clock and 4 o'clock positions.  The anterior chamber was then filled with a 1% nonpreserved lidocaine solution.  This was followed by using Provisc to mobilize the nuclear fragment, which was located in the chamber angle.  After freeing up the fragment, the irrigation and aspiration handpiece was used to aspirate the fragment and with assistance from the lens chopping instrument, the fragment was removed with the irrigation and aspiration handpiece.  The viscoelastic was fully removed.  Stromal hydration of the main incision and paracentesis ports was performed with balanced salt solution on a fine cannula.  The intraocular pressure was assessed to be normal.  The patient tolerated the procedure well.  There were no  operative complications, and he was returned to the recovery area in satisfactory condition.  There were no surgical specimens.  No prosthetic devices.         ______________________________ Susanne Greenhouse, MD    KEH/MEDQ  D:  12/08/2012  T:  12/09/2012  Job:  [id number]

## 2013-04-19 ENCOUNTER — Encounter (HOSPITAL_COMMUNITY): Payer: Self-pay

## 2013-04-19 ENCOUNTER — Emergency Department (HOSPITAL_COMMUNITY): Payer: Medicare Other

## 2013-04-19 ENCOUNTER — Inpatient Hospital Stay (HOSPITAL_COMMUNITY)
Admission: EM | Admit: 2013-04-19 | Discharge: 2013-04-29 | DRG: 190 | Disposition: A | Discharge status: Home Health | Payer: Medicare Other | Attending: Pulmonary Disease | Admitting: Pulmonary Disease

## 2013-04-19 DIAGNOSIS — M109 Gout, unspecified: Secondary | ICD-10-CM | POA: Diagnosis present

## 2013-04-19 DIAGNOSIS — I4891 Unspecified atrial fibrillation: Secondary | ICD-10-CM

## 2013-04-19 DIAGNOSIS — K565 Intestinal adhesions [bands], unspecified as to partial versus complete obstruction: Secondary | ICD-10-CM | POA: Diagnosis not present

## 2013-04-19 DIAGNOSIS — M129 Arthropathy, unspecified: Secondary | ICD-10-CM | POA: Diagnosis present

## 2013-04-19 DIAGNOSIS — I10 Essential (primary) hypertension: Secondary | ICD-10-CM | POA: Diagnosis present

## 2013-04-19 DIAGNOSIS — I2789 Other specified pulmonary heart diseases: Secondary | ICD-10-CM | POA: Diagnosis present

## 2013-04-19 DIAGNOSIS — J449 Chronic obstructive pulmonary disease, unspecified: Secondary | ICD-10-CM | POA: Diagnosis present

## 2013-04-19 DIAGNOSIS — E1165 Type 2 diabetes mellitus with hyperglycemia: Secondary | ICD-10-CM | POA: Diagnosis present

## 2013-04-19 DIAGNOSIS — N289 Disorder of kidney and ureter, unspecified: Secondary | ICD-10-CM | POA: Diagnosis present

## 2013-04-19 DIAGNOSIS — Z79899 Other long term (current) drug therapy: Secondary | ICD-10-CM

## 2013-04-19 DIAGNOSIS — Z8701 Personal history of pneumonia (recurrent): Secondary | ICD-10-CM

## 2013-04-19 DIAGNOSIS — J9620 Acute and chronic respiratory failure, unspecified whether with hypoxia or hypercapnia: Secondary | ICD-10-CM | POA: Diagnosis present

## 2013-04-19 DIAGNOSIS — N183 Chronic kidney disease, stage 3 unspecified: Secondary | ICD-10-CM | POA: Diagnosis present

## 2013-04-19 DIAGNOSIS — K429 Umbilical hernia without obstruction or gangrene: Secondary | ICD-10-CM | POA: Diagnosis present

## 2013-04-19 DIAGNOSIS — Z87891 Personal history of nicotine dependence: Secondary | ICD-10-CM

## 2013-04-19 DIAGNOSIS — I359 Nonrheumatic aortic valve disorder, unspecified: Secondary | ICD-10-CM | POA: Diagnosis present

## 2013-04-19 DIAGNOSIS — K56609 Unspecified intestinal obstruction, unspecified as to partial versus complete obstruction: Secondary | ICD-10-CM | POA: Diagnosis not present

## 2013-04-19 DIAGNOSIS — E119 Type 2 diabetes mellitus without complications: Secondary | ICD-10-CM | POA: Diagnosis present

## 2013-04-19 DIAGNOSIS — Z823 Family history of stroke: Secondary | ICD-10-CM

## 2013-04-19 DIAGNOSIS — I4892 Unspecified atrial flutter: Secondary | ICD-10-CM | POA: Diagnosis present

## 2013-04-19 DIAGNOSIS — Z8249 Family history of ischemic heart disease and other diseases of the circulatory system: Secondary | ICD-10-CM

## 2013-04-19 DIAGNOSIS — I509 Heart failure, unspecified: Secondary | ICD-10-CM | POA: Diagnosis present

## 2013-04-19 DIAGNOSIS — IMO0002 Reserved for concepts with insufficient information to code with codable children: Secondary | ICD-10-CM | POA: Diagnosis present

## 2013-04-19 DIAGNOSIS — I5032 Chronic diastolic (congestive) heart failure: Secondary | ICD-10-CM | POA: Diagnosis not present

## 2013-04-19 DIAGNOSIS — J441 Chronic obstructive pulmonary disease with (acute) exacerbation: Principal | ICD-10-CM | POA: Diagnosis present

## 2013-04-19 DIAGNOSIS — Z954 Presence of other heart-valve replacement: Secondary | ICD-10-CM

## 2013-04-19 HISTORY — DX: Heart failure, unspecified: I50.9

## 2013-04-19 HISTORY — DX: Dependence on supplemental oxygen: Z99.81

## 2013-04-19 LAB — CBC WITH DIFFERENTIAL/PLATELET
Band Neutrophils: 36 % — ABNORMAL HIGH (ref 0–10)
Blasts: 0 %
HCT: 36.2 % — ABNORMAL LOW (ref 39.0–52.0)
MCHC: 32.6 g/dL (ref 30.0–36.0)
MCV: 94.3 fL (ref 78.0–100.0)
Metamyelocytes Relative: 5 %
Monocytes Absolute: 1 10*3/uL (ref 0.1–1.0)
Myelocytes: 0 %
Platelets: 113 10*3/uL — ABNORMAL LOW (ref 150–400)
Promyelocytes Absolute: 0 %
RDW: 16.6 % — ABNORMAL HIGH (ref 11.5–15.5)
nRBC: 0 /100 WBC

## 2013-04-19 LAB — GLUCOSE, CAPILLARY
Glucose-Capillary: 423 mg/dL — ABNORMAL HIGH (ref 70–99)
Glucose-Capillary: 395 mg/dL — ABNORMAL HIGH (ref 70–99)

## 2013-04-19 LAB — BASIC METABOLIC PANEL
CO2: 27 mEq/L (ref 19–32)
Calcium: 9.3 mg/dL (ref 8.4–10.5)
Chloride: 101 mEq/L (ref 96–112)
Potassium: 4.2 mEq/L (ref 3.5–5.1)
Sodium: 139 mEq/L (ref 135–145)

## 2013-04-19 MED ORDER — GLYBURIDE 5 MG PO TABS
2.5000 mg | ORAL_TABLET | Freq: Every day | ORAL | Status: DC
  Administered 2013-04-20 – 2013-04-22 (×3): 2.5 mg via ORAL
  Filled 2013-04-19 (×2): qty 1
  Filled 2013-04-19: qty 2
  Filled 2013-04-19 (×2): qty 1

## 2013-04-19 MED ORDER — INSULIN ASPART 100 UNIT/ML ~~LOC~~ SOLN: 0.0000 [IU] | Freq: Three times a day (TID) | SUBCUTANEOUS | Status: DC

## 2013-04-19 MED ORDER — INSULIN ASPART 100 UNIT/ML ~~LOC~~ SOLN
0.0000 [IU] | Freq: Every day | SUBCUTANEOUS | Status: DC
Start: 2013-04-19 — End: 2013-04-23
  Administered 2013-04-19: 5 [IU] via SUBCUTANEOUS
  Administered 2013-04-20: 2 [IU] via SUBCUTANEOUS

## 2013-04-19 MED ORDER — CEFTRIAXONE SODIUM 1 G IJ SOLR
1.0000 g | INTRAMUSCULAR | Status: DC
  Administered 2013-04-19 – 2013-04-28 (×10): 1 g via INTRAVENOUS
  Filled 2013-04-19 (×12): qty 10

## 2013-04-19 MED ORDER — ENOXAPARIN SODIUM 40 MG/0.4ML ~~LOC~~ SOLN
40.0000 mg | SUBCUTANEOUS | Status: DC
  Administered 2013-04-19 – 2013-04-20 (×2): 40 mg via SUBCUTANEOUS
  Filled 2013-04-19 (×2): qty 0.4

## 2013-04-19 MED ORDER — FUROSEMIDE 40 MG PO TABS
40.0000 mg | ORAL_TABLET | Freq: Every day | ORAL | Status: DC
  Administered 2013-04-20 – 2013-04-22 (×3): 40 mg via ORAL
  Filled 2013-04-19 (×3): qty 1

## 2013-04-19 MED ORDER — DILTIAZEM HCL 100 MG IV SOLR
5.0000 mg/h | INTRAVENOUS | Status: DC
  Administered 2013-04-19: 5 mg/h via INTRAVENOUS
  Administered 2013-04-19: 10 mg/h via INTRAVENOUS
  Administered 2013-04-20: 15 mg/h via INTRAVENOUS
  Administered 2013-04-20 – 2013-04-21 (×2): 10 mg/h via INTRAVENOUS
  Administered 2013-04-21: 12 mg/h via INTRAVENOUS
  Administered 2013-04-21: 10 mg/h via INTRAVENOUS
  Filled 2013-04-19: qty 100

## 2013-04-19 MED ORDER — METHYLPREDNISOLONE SODIUM SUCC 125 MG IJ SOLR: 125.0000 mg | Freq: Four times a day (QID) | INTRAMUSCULAR | Status: DC

## 2013-04-19 MED ORDER — VITAMIN C 500 MG PO TABS
1000.0000 mg | ORAL_TABLET | Freq: Every day | ORAL | Status: DC
  Administered 2013-04-20 – 2013-04-29 (×7): 1000 mg via ORAL
  Filled 2013-04-19: qty 2
  Filled 2013-04-19: qty 1
  Filled 2013-04-19 (×2): qty 2
  Filled 2013-04-19: qty 1
  Filled 2013-04-19: qty 2
  Filled 2013-04-19: qty 1
  Filled 2013-04-19: qty 2

## 2013-04-19 MED ORDER — ALLOPURINOL 300 MG PO TABS
300.0000 mg | ORAL_TABLET | Freq: Every day | ORAL | Status: DC
  Administered 2013-04-19 – 2013-04-22 (×4): 300 mg via ORAL
  Filled 2013-04-19 (×4): qty 1

## 2013-04-19 MED ORDER — ASPIRIN EC 81 MG PO TBEC
81.0000 mg | DELAYED_RELEASE_TABLET | Freq: Every day | ORAL | Status: DC
  Administered 2013-04-19 – 2013-04-21 (×3): 81 mg via ORAL
  Filled 2013-04-19 (×3): qty 1

## 2013-04-19 MED ORDER — DILTIAZEM LOAD VIA INFUSION
10.0000 mg | Freq: Once | INTRAVENOUS | Status: AC
  Administered 2013-04-19: 10 mg via INTRAVENOUS
  Filled 2013-04-19: qty 10

## 2013-04-19 MED ORDER — OMEGA-3-ACID ETHYL ESTERS 1 G PO CAPS
1.0000 g | ORAL_CAPSULE | Freq: Every day | ORAL | Status: DC
  Administered 2013-04-20 – 2013-04-22 (×3): 1 g via ORAL
  Filled 2013-04-19 (×3): qty 1

## 2013-04-19 MED ORDER — DILTIAZEM HCL ER 240 MG PO CP24: 240.0000 mg | ORAL_CAPSULE | Freq: Every day | ORAL | Status: DC

## 2013-04-19 MED ORDER — ALBUTEROL SULFATE (5 MG/ML) 0.5% IN NEBU
10.0000 mg | INHALATION_SOLUTION | Freq: Once | RESPIRATORY_TRACT | Status: AC
  Administered 2013-04-19: 10 mg via RESPIRATORY_TRACT
  Filled 2013-04-19: qty 0.5

## 2013-04-19 MED ORDER — DEXTROSE 5 % IV SOLN
1.0000 g | INTRAVENOUS | Status: DC
  Filled 2013-04-19: qty 10

## 2013-04-19 MED ORDER — DEXTROSE 5 % IV SOLN
500.0000 mg | INTRAVENOUS | Status: DC
  Administered 2013-04-19 – 2013-04-21 (×3): 500 mg via INTRAVENOUS
  Filled 2013-04-19 (×4): qty 500

## 2013-04-19 MED ORDER — POTASSIUM CHLORIDE CRYS ER 20 MEQ PO TBCR
20.0000 meq | EXTENDED_RELEASE_TABLET | Freq: Every day | ORAL | Status: DC
  Administered 2013-04-20 – 2013-04-22 (×3): 20 meq via ORAL
  Filled 2013-04-19 (×3): qty 1

## 2013-04-19 MED ORDER — INSULIN ASPART 100 UNIT/ML ~~LOC~~ SOLN
20.0000 [IU] | Freq: Once | SUBCUTANEOUS | Status: AC
  Administered 2013-04-19: 20 [IU] via SUBCUTANEOUS

## 2013-04-19 MED ORDER — LISINOPRIL 10 MG PO TABS
10.0000 mg | ORAL_TABLET | Freq: Every day | ORAL | Status: DC
  Administered 2013-04-20 – 2013-04-22 (×3): 10 mg via ORAL
  Filled 2013-04-19 (×3): qty 1

## 2013-04-19 MED ORDER — BIOTENE DRY MOUTH MT LIQD
15.0000 mL | Freq: Two times a day (BID) | OROMUCOSAL | Status: DC
  Administered 2013-04-19 – 2013-04-29 (×19): 15 mL via OROMUCOSAL

## 2013-04-19 MED ORDER — DEXTROSE 5 % IV SOLN
500.0000 mg | INTRAVENOUS | Status: DC
  Filled 2013-04-19: qty 500

## 2013-04-19 MED ORDER — LEVALBUTEROL HCL 1.25 MG/0.5ML IN NEBU
1.2500 mg | INHALATION_SOLUTION | RESPIRATORY_TRACT | Status: DC
  Administered 2013-04-19 – 2013-04-29 (×45): 1.25 mg via RESPIRATORY_TRACT
  Filled 2013-04-19 (×46): qty 0.5

## 2013-04-19 MED ORDER — IPRATROPIUM BROMIDE 0.02 % IN SOLN
1.0000 mg | Freq: Once | RESPIRATORY_TRACT | Status: AC
  Administered 2013-04-19: 1 mg via RESPIRATORY_TRACT
  Filled 2013-04-19: qty 5

## 2013-04-19 MED ORDER — OMEGA-3 FATTY ACIDS 1000 MG PO CAPS: 1.0000 g | ORAL_CAPSULE | Freq: Every day | ORAL | Status: DC

## 2013-04-19 MED ORDER — INSULIN ASPART 100 UNIT/ML ~~LOC~~ SOLN
0.0000 [IU] | Freq: Three times a day (TID) | SUBCUTANEOUS | Status: DC
  Administered 2013-04-20: 7 [IU] via SUBCUTANEOUS
  Administered 2013-04-20 (×2): 11 [IU] via SUBCUTANEOUS
  Administered 2013-04-21: 7 [IU] via SUBCUTANEOUS
  Administered 2013-04-21 (×2): 11 [IU] via SUBCUTANEOUS
  Administered 2013-04-22 (×2): 7 [IU] via SUBCUTANEOUS
  Administered 2013-04-22: 4 [IU] via SUBCUTANEOUS
  Administered 2013-04-23: 7 [IU] via SUBCUTANEOUS
  Administered 2013-04-23 (×2): 4 [IU] via SUBCUTANEOUS

## 2013-04-19 MED ORDER — METHYLPREDNISOLONE SODIUM SUCC 125 MG IJ SOLR
125.0000 mg | Freq: Once | INTRAMUSCULAR | Status: AC
  Administered 2013-04-19: 125 mg via INTRAVENOUS
  Filled 2013-04-19: qty 2

## 2013-04-19 MED ORDER — FUROSEMIDE 10 MG/ML IJ SOLN
40.0000 mg | Freq: Once | INTRAMUSCULAR | Status: AC
  Administered 2013-04-19: 40 mg via INTRAVENOUS
  Filled 2013-04-19: qty 4

## 2013-04-19 MED ORDER — IPRATROPIUM BROMIDE 0.02 % IN SOLN
0.5000 mg | RESPIRATORY_TRACT | Status: DC
  Administered 2013-04-19 – 2013-04-29 (×45): 0.5 mg via RESPIRATORY_TRACT
  Filled 2013-04-19 (×45): qty 2.5

## 2013-04-19 MED ORDER — ACETAMINOPHEN 500 MG PO TABS: 500.0000 mg | ORAL_TABLET | ORAL | Status: DC | PRN

## 2013-04-19 MED ORDER — SODIUM CHLORIDE 0.9 % IV SOLN
INTRAVENOUS | Status: DC
  Administered 2013-04-19: 15:00:00 via INTRAVENOUS

## 2013-04-19 MED ORDER — METHYLPREDNISOLONE SODIUM SUCC 125 MG IJ SOLR
125.0000 mg | Freq: Four times a day (QID) | INTRAMUSCULAR | Status: DC
  Administered 2013-04-19 – 2013-04-20 (×5): 125 mg via INTRAVENOUS
  Filled 2013-04-19 (×5): qty 2

## 2013-04-19 NOTE — ED Notes (Signed)
Report given to Armanda Magic, RN ICU. Ready to receive patient.

## 2013-04-19 NOTE — ED Notes (Signed)
Pt c/o nonproductive cough, dizziness, SOB, and chest congestion x 3 days.  Denies pain.  Pt became nauseated during triage and vomited.

## 2013-04-19 NOTE — ED Notes (Signed)
Dr Sudie Bailey at bedside.

## 2013-04-19 NOTE — ED Provider Notes (Signed)
History     CSN: [id number]  Arrival date & time 04/19/13  5621   First MD Initiated Contact with Patient 04/19/13 0935      Chief Complaint  Patient presents with  . Shortness of Breath  . Dizziness   HPI Pt was seen at 0945.   Per pt and his spouse, c/o gradual onset and worsening of persistent cough and SOB for the past 3 days. Has been associated with feeling "lightheaded."  SOB worsens with walking. States he has been using home nebs and O2 N/C more frequently than his usual baseline and without relief of his symptoms.  Denies CP/palpitations, no back pain, no abd pain, no N/V/D, no fevers, no rash.     Past Medical History  Diagnosis Date  . COPD (chronic obstructive pulmonary disease)   . AF (atrial fibrillation)   . Hypertension   . Gout   . Arthritis   . Pneumonia     history of pneumonia  . Small bowel obstruction   . Renal insufficiency   . Aortic stenosis   . Pneumonia 10/12  . Hydropneumothorax   . Diabetes mellitus   . CHF (congestive heart failure)   . On home O2     qhs prn    Past Surgical History  Procedure Laterality Date  . Rotator cuff repair      left and right  . Cardiac valve replacement      aortic valve with a tissue prosthesis and left sided maze proc  . S/p rewiring of sternum for dehiscence    . Gallbladder surgery    . US echocardiography  01-03-11    EF 55-60%  . Cardiovascular stress test  01-26-09    EF 67%  . Coronary angioplasty    . Cholecystectomy    . Coronary artery bypass graft      with aortic valve replacement  . Sternal incision reclosure  08/29/2010    sternal rewiring  . Cataract extraction w/phaco  10/02/2012    Procedure: CATARACT EXTRACTION PHACO AND INTRAOCULAR LENS PLACEMENT (IOC);  Surgeon: Gemma Payor, MD;  Location: [facility];  Service: Ophthalmology;  Laterality: Right;  CDE 16.68  . Cataract extraction w/phaco  10/27/2012    Procedure: CATARACT EXTRACTION PHACO AND INTRAOCULAR LENS PLACEMENT (IOC);  Surgeon:  Gemma Payor, MD;  Location: [facility];  Service: Ophthalmology;  Laterality: Left;  CDE:16.89  . Artificial aortic valve      Family History  Problem Relation Age of Onset  . Hypertension Mother   . Stroke Mother   . Heart disease Sister   . Kidney disease Sister     History  Substance Use Topics  . Smoking status: Former Smoker    Types: Cigarettes    Quit date: 01/18/2005  . Smokeless tobacco: Not on file  . Alcohol Use: No      Review of Systems ROS: Statement: All systems negative except as marked or noted in the HPI; Constitutional: Negative for fever and chills. ; ; Eyes: Negative for eye pain, redness and discharge. ; ; ENMT: Negative for ear pain, hoarseness, nasal congestion, sinus pressure and sore throat. ; ; Cardiovascular: Negative for chest pain, palpitations, diaphoresis, and peripheral edema. ; ; Respiratory: +SOB, cough. Negative for wheezing and stridor. ; ; Gastrointestinal: Negative for nausea, vomiting, diarrhea, abdominal pain, blood in stool, hematemesis, jaundice and rectal bleeding. . ; ; Genitourinary: Negative for dysuria, flank pain and hematuria. ; ; Musculoskeletal: Negative for back pain and neck pain.  Negative for swelling and trauma.; ; Skin: Negative for pruritus, rash, abrasions, blisters, bruising and skin lesion.; ; Neuro: +lightheadedness. Negative for headache and neck stiffness. Negative for weakness, altered level of consciousness , altered mental status, extremity weakness, paresthesias, involuntary movement, seizure and syncope.       Allergies  Review of patient's allergies indicates no known allergies.  Home Medications   Current Outpatient Rx  Name  Route  Sig  Dispense  Refill  . acetaminophen (TYLENOL) 500 MG tablet   Oral   Take 1,000 mg by mouth every 6 (six) hours as needed. Pain         . allopurinol (ZYLOPRIM) 300 MG tablet   Oral   Take 300 mg by mouth daily.          . Ascorbic Acid (VITAMIN C) 1000 MG tablet   Oral    Take 1,000 mg by mouth daily.         Marland Kitchen aspirin 81 MG tablet   Oral   Take 81 mg by mouth daily.           Marland Kitchen diltiazem (DILACOR XR) 240 MG 24 hr capsule   Oral   Take 240 mg by mouth daily.          . fish oil-omega-3 fatty acids 1000 MG capsule   Oral   Take 1 g by mouth daily.         . fluticasone (FLOVENT HFA) 110 MCG/ACT inhaler   Inhalation   Inhale 1 puff into the lungs 2 (two) times daily.         . furosemide (LASIX) 40 MG tablet   Oral   Take 40 mg by mouth daily.           Marland Kitchen glyBURIDE (DIABETA) 2.5 MG tablet   Oral   Take 2.5 mg by mouth daily.           Marland Kitchen ipratropium-albuterol (DUONEB) 0.5-2.5 (3) MG/3ML SOLN   Nebulization   Take 3 mLs by nebulization 4 (four) times daily.         Marland Kitchen lisinopril (PRINIVIL,ZESTRIL) 10 MG tablet   Oral   Take 10 mg by mouth daily.          Marland Kitchen neomycin-polymyxin-hydrocortisone (CORTISPORIN) otic solution   Both Ears   Place 3 drops into both ears daily as needed. Earaches         . potassium chloride SA (K-DUR,KLOR-CON) 20 MEQ tablet   Oral   Take 20 mEq by mouth daily.             BP 140/69  Pulse 96  Temp(Src) 97.7 F (36.5 C) (Oral)  Resp 18  Ht 5\' 6"  (1.676 m)  Wt 182 lb (82.555 kg)  BMI 29.39 kg/m2  SpO2 95%  Physical Exam 0950: Physical examination:  Nursing notes reviewed; Vital signs and O2 SAT reviewed;  Constitutional: Well developed, Well nourished, Uncomfortable appearing; Head:  Normocephalic, atraumatic; Eyes: EOMI, PERRL, No scleral icterus; ENMT: Mouth and pharynx normal, Mucous membranes dry; Neck: Supple, Full range of motion, No lymphadenopathy; Cardiovascular: Irregular irregular rate and rhythm, No gallop; Respiratory: Breath sounds diminished & equal bilaterally, faint scattered wheeze. No audible wheezing. Speaking in short sentences. Sitting upright, tachypneic.; Chest: Nontender, Movement normal; Abdomen: Soft, Nontender, Nondistended, Normal bowel sounds; Genitourinary: No  CVA tenderness; Extremities: Pulses normal, No tenderness, No edema, No calf edema or asymmetry.; Neuro: AA&Ox3, Major CN grossly intact.  Speech clear. No gross focal motor or  sensory deficits in extremities.; Skin: Color normal, Warm, Dry.   ED Course  Procedures   [phone]:  Appears uncomfortable, sitting upright, tachypneic.  Pt became tachycardic when moved from wheelchair to stretcher on arrival to the ED, with Sats decreasing to 92% R/A.  N/V x1.  Pt has hx COPD with diminished lung sounds and faint wheezing; will dose IV steroid and start continuous neb.   1115:  Neb stopped before completely finished due to HR increasing to 130's. Will repeat EKG.  Lungs with insp/exp wheezing bilat, Sats 95% on O2 3L N/C.   1150:  Repeat EKG continues aflutter, rate 100's.  Pt's HR increases when he exerts himself, improves to 90-100's with resting.  BNP elevated, no old to compare; will dose IV lasix.  H/H per baseline. Dx and testing d/w pt and family.  Questions answered.  Verb understanding, agreeable to admit. T/C to Dr. Sudie Bailey, case discussed, including:  HPI, pertinent PM/SHx, VS/PE, dx testing, ED course and treatment:  Agreeable to admit, requests he will come to ED for eval.     MDM  MDM Reviewed: previous chart, nursing note and vitals Reviewed previous: labs and ECG Interpretation: labs, ECG and x-ray Total time providing critical care: 30-74 minutes. This excludes time spent performing separately reportable procedures and services. Consults: admitting MD   CRITICAL CARE Performed by: Laray Anger Total critical care time: 45 Critical care time was exclusive of separately billable procedures and treating other patients. Critical care was necessary to treat or prevent imminent or life-threatening deterioration. Critical care was time spent personally by me on the following activities: development of treatment plan with patient and/or surrogate as well as nursing, discussions with  consultants, evaluation of patient's response to treatment, examination of patient, obtaining history from patient or surrogate, ordering and performing treatments and interventions, ordering and review of laboratory studies, ordering and review of radiographic studies, pulse oximetry and re-evaluation of patient's condition.   Date: 04/19/2013 on arrival  Rate: 91  Rhythm: atrial flutter and premature ventricular contractions (PVC)  QRS Axis: normal  Intervals: normal  ST/T Wave abnormalities: nonspecific ST/T changes  Conduction Disutrbances:none  Narrative Interpretation:   Old EKG Reviewed: changes noted; new aflutter compared to previous EKG dated 09/29/2012.   Date: 04/19/2013 repeat due to tachycardia on monitor  Rate: 103  Rhythm: atrial flutter  QRS Axis: normal  Intervals: normal  ST/T Wave abnormalities: nonspecific ST/T changes  Conduction Disutrbances:none  Narrative Interpretation:   Old EKG Reviewed: unchanged from previous EKG today.    Results for orders placed during the hospital encounter of 04/19/13  CBC WITH DIFFERENTIAL      Result Value Range   WBC 10.9 (*) 4.0 - 10.5 K/uL   RBC 3.84 (*) 4.22 - 5.81 MIL/uL   Hemoglobin 11.8 (*) 13.0 - 17.0 g/dL   HCT 96.0 (*) 45.4 - 09.8 %   MCV 94.3  78.0 - 100.0 fL   MCH 30.7  26.0 - 34.0 pg   MCHC 32.6  30.0 - 36.0 g/dL   RDW 11.9 (*) 14.7 - 82.9 %   Platelets 113 (*) 150 - 400 K/uL   Neutrophils Relative 44  43 - 77 %   Lymphocytes Relative 6 (*) 12 - 46 %   Monocytes Relative 9  3 - 12 %   Eosinophils Relative 0  0 - 5 %   Basophils Relative 0  0 - 1 %   Band Neutrophils 36 (*) 0 - 10 %  Metamyelocytes Relative 5     Myelocytes 0     Promyelocytes Absolute 0     Blasts 0     nRBC 0  0 /100 WBC   Neutro Abs 9.2 (*) 1.7 - 7.7 K/uL   Lymphs Abs 0.7  0.7 - 4.0 K/uL   Monocytes Absolute 1.0  0.1 - 1.0 K/uL   Eosinophils Absolute 0.0  0.0 - 0.7 K/uL   Basophils Absolute 0.0  0.0 - 0.1 K/uL   WBC Morphology  WHITE COUNT CONFIRMED ON SMEAR    BASIC METABOLIC PANEL      Result Value Range   Sodium 139  135 - 145 mEq/L   Potassium 4.2  3.5 - 5.1 mEq/L   Chloride 101  96 - 112 mEq/L   CO2 27  19 - 32 mEq/L   Glucose, Bld 146 (*) 70 - 99 mg/dL   BUN 19  6 - 23 mg/dL   Creatinine, Ser 1.61  0.50 - 1.35 mg/dL   Calcium 9.3  8.4 - 09.6 mg/dL   GFR calc non Af Amer 56 (*) >90 mL/min   GFR calc Af Amer 65 (*) >90 mL/min  TROPONIN I      Result Value Range   Troponin I <0.30  <0.30 ng/mL  PRO B NATRIURETIC PEPTIDE      Result Value Range   Pro B Natriuretic peptide (BNP) 3127.0 (*) 0 - 125 pg/mL   Dg Chest 2 View 04/19/2013  *RADIOLOGY REPORT*  Clinical Data: Shortness of breath, dizziness  CHEST - 2 VIEW  Comparison: 02/03/2012  Findings: Chronic interstitial markings.  No focal consolidation. No pleural effusion or pneumothorax.  The heart is top normal in size.  Prosthetic aortic valve.  Degenerative changes of the visualized thoracolumbar spine.  IMPRESSION: No evidence of acute cardiopulmonary disease.   Original Report Authenticated By: Charline Bills, M.D.    Results for DEWITT, JUDICE (MRN [id number]) as of 04/19/2013 11:35  Ref. Range 10/12/2011 14:45 10/13/2011 04:36 10/26/2011 15:01 09/29/2012 13:00 04/19/2013 09:50  Hemoglobin Latest Range: 13.0-17.0 g/dL 91.4 78.2 (L) 95.6 (L) 12.2 (L) 11.8 (L)  HCT Latest Range: 39.0-52.0 % 41.8 35.2 (L) 37.5 (L) 38.0 (L) 36.2 (L)          Laray Anger, DO 04/21/13 2022

## 2013-04-19 NOTE — ED Notes (Signed)
MD at bedside. 

## 2013-04-20 DIAGNOSIS — I379 Nonrheumatic pulmonary valve disorder, unspecified: Secondary | ICD-10-CM

## 2013-04-20 DIAGNOSIS — J441 Chronic obstructive pulmonary disease with (acute) exacerbation: Secondary | ICD-10-CM | POA: Diagnosis present

## 2013-04-20 LAB — GLUCOSE, CAPILLARY
Glucose-Capillary: 279 mg/dL — ABNORMAL HIGH (ref 70–99)
Glucose-Capillary: 220 mg/dL — ABNORMAL HIGH (ref 70–99)

## 2013-04-20 LAB — HEMOGLOBIN A1C: Hgb A1c MFr Bld: 5.8 % — ABNORMAL HIGH (ref <5.7)

## 2013-04-20 MED ORDER — ALPRAZOLAM 0.25 MG PO TABS
0.2500 mg | ORAL_TABLET | Freq: Three times a day (TID) | ORAL | Status: DC | PRN
  Administered 2013-04-20: 0.25 mg via ORAL
  Filled 2013-04-20: qty 1

## 2013-04-20 MED ORDER — METHYLPREDNISOLONE SODIUM SUCC 40 MG IJ SOLR
40.0000 mg | Freq: Four times a day (QID) | INTRAMUSCULAR | Status: DC
  Administered 2013-04-20 – 2013-04-27 (×25): 40 mg via INTRAVENOUS
  Filled 2013-04-20 (×25): qty 1

## 2013-04-20 MED ORDER — TRAZODONE HCL 50 MG PO TABS
50.0000 mg | ORAL_TABLET | Freq: Every evening | ORAL | Status: DC | PRN
  Administered 2013-04-20: 50 mg via ORAL
  Filled 2013-04-20: qty 1

## 2013-04-20 NOTE — Progress Notes (Signed)
*  PRELIMINARY RESULTS* Echocardiogram 2D Echocardiogram has been performed.  Conrad Dunbar 04/20/2013, 4:02 PM

## 2013-04-20 NOTE — H&P (Signed)
NAME:  [name] [name]               ACCOUNT NO.:  [id number]  MEDICAL RECORD NO.:  [id number]  LOCATION:                                 FACILITY:  PHYSICIAN:  Mila Homer. Sudie Bailey, M.D.DATE OF BIRTH:  09-23-1936  DATE OF ADMISSION:  04/19/2013 DATE OF DISCHARGE:  LH                             HISTORY & PHYSICAL   This 77 year old has been short of breath the last 3 or 4 days.  He denies fever and chills.  Has had a cough, but no sputum production.  He has a long and complicated medical history.  He smoked cigarettes for about 40 years, averaging about a pack a day.  In 2012 he had his aortic valve replaced due to aortic stenosis.  He had a lengthy hospitalization after this.  He sees Dr. [name], pulmonologist.  Other medical problems include:  1. Chronic atrial fibrillation. 2. Benign essential hypertension. 3. Gout. 4. Arthritis. 5. Renal insufficiency. 6. Congestive heart failure. 7. Diabetes.  He is on home oxygen.  Medications at home include:  1. Acetaminophen 1000 mg q.6 hours p.r.n. 2. Allopurinol 300 mg daily. 3. Ascorbic acid 1000 mg daily. 4. Aspirin 81 mg daily. 5. Diltiazem XR 240 mg daily. 6. Fish oil with omega 3 fatty acids 1000 mg daily. 7. Fluticasone inhaler 110 mcg 1 puff b.i.d. 8. Furosemide 40 mg daily. 9. Glyburide 2.5 mg daily. 10.Ipratropium/albuterol nebulizer q.i.d. 11.Lisinopril 10 mg daily. 12.Potassium chloride 20 mEq daily. 13.Cortisporin otic solution 3 drops into both ears as needed for     earache.  He arrives in the emergency room with his wife.  VITAL SIGNS:  Temperature is 97.7, pulse 107, respiratory rate 21, blood pressure 125/77. GENERAL:  He is sitting up in bed.  He appears to be alert and oriented. His color is good.  He is on O2 by nasal cannula.  He seemed somewhat weak. SKIN:  Turgor is normal. HEENT:  Mucous membranes appear to be moist. LUNGS:  Decreased breath sounds throughout. HEART:  Regular rhythm, but  a rate of 120-130 at the time of my exam. ABDOMEN:  Soft without organomegaly or mass or tenderness.  He is obese. EXTREMITIES:  There is no edema of the ankles.  His admission white cell count is 10,900, of which 44% are neutrophils, 6 lymphs, and 0 eos.  Hemoglobin is 11.8 and platelet count 113,100.  His BMP is essentially normal and his BNP is 3127.  Chest x-ray shows the heart is top-normal in size and there is a prosthetic aortic valve and there are  chronic interstitial markings, but nothing acute.  His 12-lead EKG shows atrial flutter.  There are flutter waves running at 300 beats a minute.  This EKG is compared to one done September 29, 2012.  At that time he had sinus rhythm.  There is ST depression in the anterior lateral leads.  ASSESSMENT: 1. Chronic obstructive pulmonary disease. 2. Atrial fibrillation with rapid ventricular response. 3. Aortic stenosis, status post valve replacement. 4. Benign essential hypertension. 5. Chronic obstructive pulmonary disease. 6. Gout. 7. Renal insufficiency. 8. Diabetes. 9. Congestive heart failure.  I think some of this may be COPD, but also he  may have problems with his atrial fibrillation.  I am putting him in the ICU.  I will put him on a Cardizem drip, as well as ceftriaxone and azithromycin.  The last time he was hospitalized was over a year ago, so he does have infection I assume with community- acquired organisms.  I have discussed all this with the patient and his wife.  He will also be on some Solu-Medrol and his diabetes will be followed closely.     Mila Homer. Sudie Bailey, M.D.     SDK/MEDQ  D:  04/19/2013  T:  04/20/2013  Job:  [id number]

## 2013-04-20 NOTE — Progress Notes (Signed)
Subjective: He was admitted with COPD exacerbation and atrial fibrillation. He started getting sick about a week ago. He was taking what I believe is over-the-counter Coricidin HBP but said it didn't make any difference. He eventually got short of breath enough that he came to the emergency department where he was seen to have a COPD exacerbation and atrial fibrillation. He says he's been coughing some and short of breath.  Objective: Vital signs in last 24 hours: Temp:  [97.7 F (36.5 C)-98.6 F (37 C)] 97.9 F (36.6 C) (04/21 0400) Pulse Rate:  [34-139] 61 (04/21 0700) Resp:  [16-28] 20 (04/21 0700) BP: (97-160)/(47-87) 115/59 mmHg (04/21 0700) SpO2:  [9 %-100 %] 98 % (04/21 0726) Weight:  [82.555 kg (182 lb)-84.2 kg (185 lb 10 oz)] 84.2 kg (185 lb 10 oz) (04/21 0500) Weight change:  Last BM Date: 04/18/13  Intake/Output from previous day: 04/20 0701 - 04/21 0700 In: 464.1 [I.V.:164.1; IV Piggyback:300] Out: 1200 [Urine:1200]  PHYSICAL EXAM General appearance: alert, cooperative and mild distress Resp: clear to auscultation bilaterally Cardio: irregularly irregular rhythm GI: soft, non-tender; bowel sounds normal; no masses,  no organomegaly Extremities: extremities normal, atraumatic, no cyanosis or edema  Lab Results:    Basic Metabolic Panel:  Recent Labs  16/10/96 0950 04/19/13 1613  NA 139  --   K 4.2  --   CL 101  --   CO2 27  --   GLUCOSE 146* 464*  BUN 19  --   CREATININE 1.23  --   CALCIUM 9.3  --    Liver Function Tests: No results found for this basename: AST, ALT, ALKPHOS, BILITOT, PROT, ALBUMIN,  in the last 72 hours No results found for this basename: LIPASE, AMYLASE,  in the last 72 hours No results found for this basename: AMMONIA,  in the last 72 hours CBC:  Recent Labs  04/19/13 0950  WBC 10.9*  NEUTROABS 9.2*  HGB 11.8*  HCT 36.2*  MCV 94.3  PLT 113*   Cardiac Enzymes:  Recent Labs  04/19/13 0950  TROPONINI <0.30    BNP:  Recent Labs  04/19/13 0950  PROBNP 3127.0*   D-Dimer: No results found for this basename: DDIMER,  in the last 72 hours CBG:  Recent Labs  04/19/13 1609 04/19/13 2111 04/19/13 2158  GLUCAP 423* 403* 395*   Hemoglobin A1C: No results found for this basename: HGBA1C,  in the last 72 hours Fasting Lipid Panel: No results found for this basename: CHOL, HDL, LDLCALC, TRIG, CHOLHDL, LDLDIRECT,  in the last 72 hours Thyroid Function Tests: No results found for this basename: TSH, T4TOTAL, FREET4, T3FREE, THYROIDAB,  in the last 72 hours Anemia Panel: No results found for this basename: VITAMINB12, FOLATE, FERRITIN, TIBC, IRON, RETICCTPCT,  in the last 72 hours Coagulation: No results found for this basename: LABPROT, INR,  in the last 72 hours Urine Drug Screen: Drugs of Abuse  No results found for this basename: labopia, cocainscrnur, labbenz, amphetmu, thcu, labbarb    Alcohol Level: No results found for this basename: ETH,  in the last 72 hours Urinalysis: No results found for this basename: COLORURINE, APPERANCEUR, LABSPEC, PHURINE, GLUCOSEU, HGBUR, BILIRUBINUR, KETONESUR, PROTEINUR, UROBILINOGEN, NITRITE, LEUKOCYTESUR,  in the last 72 hours Misc. Labs:  ABGS No results found for this basename: PHART, PCO2, PO2ART, TCO2, HCO3,  in the last 72 hours CULTURES Recent Results (from the past 240 hour(s))  MRSA PCR SCREENING     Status: None   Collection Time  04/19/13  3:08 PM      Result Value Range Status   MRSA by PCR NEGATIVE  NEGATIVE Final   Comment:            The GeneXpert MRSA Assay (FDA     approved for NASAL specimens     only), is one component of a     comprehensive MRSA colonization     surveillance program. It is not     intended to diagnose MRSA     infection nor to guide or     monitor treatment for     MRSA infections.   Studies/Results: Dg Chest 2 View  04/19/2013  *RADIOLOGY REPORT*  Clinical Data: Shortness of breath, dizziness   CHEST - 2 VIEW  Comparison: 02/03/2012  Findings: Chronic interstitial markings.  No focal consolidation. No pleural effusion or pneumothorax.  The heart is top normal in size.  Prosthetic aortic valve.  Degenerative changes of the visualized thoracolumbar spine.  IMPRESSION: No evidence of acute cardiopulmonary disease.   Original Report Authenticated By: Charline Bills, M.D.     Medications:  Prior to Admission:  Prescriptions prior to admission  Medication Sig Dispense Refill  . acetaminophen (TYLENOL) 500 MG tablet Take 1,000 mg by mouth every 6 (six) hours as needed. Pain      . allopurinol (ZYLOPRIM) 300 MG tablet Take 300 mg by mouth daily.       . Ascorbic Acid (VITAMIN C) 1000 MG tablet Take 1,000 mg by mouth daily.      Marland Kitchen aspirin 81 MG tablet Take 81 mg by mouth daily.        Marland Kitchen diltiazem (DILACOR XR) 240 MG 24 hr capsule Take 240 mg by mouth daily.       . fish oil-omega-3 fatty acids 1000 MG capsule Take 1 g by mouth daily.      . fluticasone (FLOVENT HFA) 110 MCG/ACT inhaler Inhale 1 puff into the lungs 2 (two) times daily.      . furosemide (LASIX) 40 MG tablet Take 40 mg by mouth daily.        Marland Kitchen glyBURIDE (DIABETA) 2.5 MG tablet Take 2.5 mg by mouth daily.        Marland Kitchen ipratropium-albuterol (DUONEB) 0.5-2.5 (3) MG/3ML SOLN Take 3 mLs by nebulization 4 (four) times daily.      Marland Kitchen lisinopril (PRINIVIL,ZESTRIL) 10 MG tablet Take 10 mg by mouth daily.       Marland Kitchen neomycin-polymyxin-hydrocortisone (CORTISPORIN) otic solution Place 3 drops into both ears daily as needed. Earaches      . potassium chloride SA (K-DUR,KLOR-CON) 20 MEQ tablet Take 20 mEq by mouth daily.         Scheduled: . allopurinol  300 mg Oral Daily  . antiseptic oral rinse  15 mL Mouth Rinse BID  . aspirin EC  81 mg Oral Daily  . azithromycin  500 mg Intravenous Q24H  . cefTRIAXone (ROCEPHIN) IVPB 1 gram/50 mL D5W  1 g Intravenous Q24H  . enoxaparin (LOVENOX) injection  40 mg Subcutaneous Q24H  . furosemide  40 mg Oral  Daily  . glyBURIDE  2.5 mg Oral Daily  . insulin aspart  0-20 Units Subcutaneous TID WC  . insulin aspart  0-5 Units Subcutaneous QHS  . ipratropium  0.5 mg Nebulization Q4H while awake  . levalbuterol  1.25 mg Nebulization Q4H while awake  . lisinopril  10 mg Oral Daily  . methylPREDNISolone (SOLU-MEDROL) injection  125 mg Intravenous Q6H  . omega-3  acid ethyl esters  1 g Oral Daily  . potassium chloride SA  20 mEq Oral Daily  . vitamin C  1,000 mg Oral Daily   Continuous: . diltiazem (CARDIZEM) infusion 10 mg/hr (04/20/13 0700)   WUJ:[id number]  Assesment: He is admitted with COPD exacerbation and atrial fibrillation. He has a history of aortic valve disease. He has a history of small bowel obstruction. He has hypertension. He has diabetes which mostly gives him trouble when he is acutely ill Active Problems:   * No active hospital problems. *    Plan: Continue current treatments I will ask for cardiology consultation    LOS: 1 day   Kecia Swoboda L 04/20/2013, 7:45 AM

## 2013-04-20 NOTE — Progress Notes (Signed)
UR chart review completed.  

## 2013-04-20 NOTE — Care Management Note (Signed)
    Page 1 of 2   04/29/2013     11:32:23 AM   CARE MANAGEMENT NOTE 04/29/2013  Patient:  [name] [name], [name] [name]   Account Number:  [id number]  Date Initiated:  04/20/2013  Documentation initiated by:  Sharrie Rothman  Subjective/Objective Assessment:   Pt admitted from home with COPD exacerbation and recurrent A fib. Pt lives at home with his wife and will return home at discharge. Pt has used [facility] in the past. Pt has home O2 with [facility] at 2 liter at night.     Action/Plan:   Pt also has a neb machine. Pt is agreeable to [facility] at discharge. Will continue to monitor for [facility] needs. Pt may need PT consult once heart rate better contolled and pt off cardizem gtt.   Anticipated DC Date:  04/29/2013   Anticipated DC Plan:  HOME W HOME HEALTH SERVICES      DC Planning Services  CM consult      Choice offered to / List presented to:          [facility] arranged  HH-1 RN      [facility] agency  [facility]   Status of service:  Completed, signed off Medicare Important Message given?  YES (If response is "NO", the following Medicare IM given date fields will be blank) Date Medicare IM given:  04/29/2013 Date Additional Medicare IM given:    Discharge Disposition:  HOME W HOME HEALTH SERVICES  Per UR Regulation:    If discussed at Long Length of Stay Meetings, dates discussed:   04/28/2013    Comments:  04/29/13 Yacob Wilkerson Leanord Hawking RN BSN CM Mr Renelda Loma with [facility] RN through [facility]. [facility] has contacted pt and will begin OUTPT case management services following DC.  04/28/13 Rosemary Holms RN BSN CM Pt anticipated to DC tomorrow. Anticoagulant change and BS medication changed today. Per MD will see if pt can tolerate new meds and BS are controled. DC with [facility] RN anticipated tomorrow. Pt has BS meter at home.  04/27/13 Talar Fraley RN BSN CM Pt states he needs to have a BM before he can go home. Agreed to [facility] following him.   04/20/13 1445 Arlyss Queen, RN BSN CM

## 2013-04-20 NOTE — Progress Notes (Signed)
Inpatient Diabetes Program Recommendations  AACE/ADA: New Consensus Statement on Inpatient Glycemic Control (2013)  Target Ranges:  Prepandial:   less than 140 mg/dL      Peak postprandial:   less than 180 mg/dL (1-2 hours)      Critically ill patients:  140 - 180 mg/dL   Results for TENG, DECOU (MRN [id number]) as of 04/20/2013 13:15  Ref. Range 04/19/2013 16:09 04/19/2013 21:11 04/19/2013 21:58 04/20/2013 07:34 04/20/2013 12:06  Glucose-Capillary Latest Range: 70-99 mg/dL 409 (H) 811 (H) 914 (H) 214 (H) 266 (H)   Inpatient Diabetes Program Recommendations Insulin - Basal: May need low dose basal insulin while receiving high dose steroids. Recommend Levemir 10 units daily and adjust accordingly.  Note: Patient has a history of diabetes and takes Glyburide 2.5 mg daily at home for diabetes management.  Currently, patient is ordered to receive Glyburide 2.5 mg daily, Novolog 0-20 units AC, and Novolog 0-5 units HS for inpatient glycemic control.  Patient is also ordered to receive Solumedrol 125 mg Q6H.  Please consider ordering low dose basal insulin while receiving high dose steroids.  Recommend Levemir 10 units daily and adjust accordingly.  Will continue to follow.  Thanks, Orlando Penner, RN, BSN, CCRN Diabetes Coordinator Inpatient Diabetes Program [phone]

## 2013-04-21 ENCOUNTER — Encounter (HOSPITAL_COMMUNITY): Payer: Self-pay | Admitting: Adult Health

## 2013-04-21 ENCOUNTER — Inpatient Hospital Stay (HOSPITAL_COMMUNITY): Payer: Medicare Other

## 2013-04-21 DIAGNOSIS — E1165 Type 2 diabetes mellitus with hyperglycemia: Secondary | ICD-10-CM | POA: Diagnosis present

## 2013-04-21 DIAGNOSIS — IMO0002 Reserved for concepts with insufficient information to code with codable children: Secondary | ICD-10-CM | POA: Diagnosis present

## 2013-04-21 LAB — GLUCOSE, CAPILLARY
Glucose-Capillary: 296 mg/dL — ABNORMAL HIGH (ref 70–99)
Glucose-Capillary: 191 mg/dL — ABNORMAL HIGH (ref 70–99)

## 2013-04-21 MED ORDER — DIGOXIN 0.25 MG/ML IJ SOLN
0.2500 mg | INTRAMUSCULAR | Status: AC
  Administered 2013-04-21 – 2013-04-22 (×3): 0.25 mg via INTRAVENOUS
  Filled 2013-04-21 (×4): qty 2

## 2013-04-21 MED ORDER — FLECAINIDE ACETATE 100 MG PO TABS
300.0000 mg | ORAL_TABLET | Freq: Once | ORAL | Status: AC
  Administered 2013-04-21: 300 mg via ORAL
  Filled 2013-04-21: qty 3

## 2013-04-21 MED ORDER — RIVAROXABAN 10 MG PO TABS
20.0000 mg | ORAL_TABLET | Freq: Every day | ORAL | Status: DC
  Administered 2013-04-21: 20 mg via ORAL
  Filled 2013-04-21 (×2): qty 2

## 2013-04-21 MED ORDER — INSULIN GLARGINE 100 UNIT/ML ~~LOC~~ SOLN
8.0000 [IU] | Freq: Every day | SUBCUTANEOUS | Status: DC
  Administered 2013-04-21: 8 [IU] via SUBCUTANEOUS
  Filled 2013-04-21 (×2): qty 0.08

## 2013-04-21 MED ORDER — DIGOXIN 0.25 MG/ML IJ SOLN
0.5000 mg | Freq: Every day | INTRAMUSCULAR | Status: AC
  Administered 2013-04-21: 0.5 mg via INTRAVENOUS

## 2013-04-21 MED ORDER — METOPROLOL TARTRATE 1 MG/ML IV SOLN: 5.0000 mg | Freq: Once | INTRAVENOUS | Status: AC

## 2013-04-21 NOTE — Clinical Documentation Improvement (Signed)
CHF DOCUMENTATION CLARIFICATION QUERY  THIS DOCUMENT IS NOT A PERMANENT PART OF THE MEDICAL RECORD  TO RESPOND TO THE THIS QUERY, FOLLOW THE INSTRUCTIONS BELOW:  1. If needed, update documentation for the patient's encounter via the notes activity.  2. Access this query again and click edit on the In [employer].  3. After updating, or not, click F2 to complete all highlighted (required) fields concerning your review. Select "additional documentation in the medical record" OR "no additional documentation provided".  4. Click Sign note button.  5. The deficiency will fall out of your In Basket *Please let us know if you are not able to complete this workflow by phone or e-mail (listed below).  Please update your documentation within the medical record to reflect your response to this query.                                                                                    04/21/13  Dear [facility]. [name],  In a better effort to capture your patient's severity of illness, reflect appropriate length of stay and utilization of resources, a review of the patient medical record has revealed the following indicators the diagnosis of Heart Failure.    Based on your clinical judgment, please clarify and document in a progress note and/or discharge summary the clinical condition associated with the following supporting information:  In responding to this query please exercise your independent judgment.  The fact that a query is asked, does not imply that any particular answer is desired or expected.  Possible Clinical Conditions?  Chronic Systolic Congestive Heart Failure Chronic Diastolic Congestive Heart Failure Chronic Systolic & Diastolic Congestive Heart Failure Acute Systolic Congestive Heart Failure Acute Diastolic Congestive Heart Failure Acute Systolic & Diastolic Congestive Heart Failure Acute on Chronic Systolic Congestive Heart Failure Acute on Chronic Diastolic Congestive  Heart Failure Acute on Chronic Systolic & Diastolic  Congestive Heart Failure Other Condition________________________________________ Cannot Clinically Determine  Supporting Information: Risk Factors: History of CHF Diagnosis of CHF on H&P  Signs & Symptoms:  Shortness of breath persistent cough Requiring more prn oxygen Diminished lung sounds Wheezing   Diagnostics: BNP=3127 CXR=IMPRESSION:  Enlargement of cardiac silhouette.  Emphysematous changes consistent with COPD.  No acute infiltrate.  Treatment: IV Lasix on admission Lasix 40mg  po daily  Reviewed: additional documentation in the medical record  Thank You,  Debora T Williams RN, MSN Clinical Documentation Specialist: Office# [phone] [facility] Information Management [place]

## 2013-04-21 NOTE — Progress Notes (Signed)
Subjective: He says he feels better. He is still short of breath. He is coughing but not coughing anything up. His blood sugar is not controlled. He is still in atrial fibrillation. Echocardiogram done yesterday showed LVH but good left ventricular function. He has some element of pulmonary hypertension probably from his COPD  Objective: Vital signs in last 24 hours: Temp:  [97.6 F (36.4 C)-98 F (36.7 C)] 97.9 F (36.6 C) (04/22 0403) Pulse Rate:  [40-134] 63 (04/22 0645) Resp:  [15-25] 22 (04/22 0645) BP: (73-129)/(48-95) 117/65 mmHg (04/22 0645) SpO2:  [77 %-100 %] 98 % (04/22 0707) Weight:  [85 kg (187 lb 6.3 oz)] 85 kg (187 lb 6.3 oz) (04/22 0500) Weight change: 2.445 kg (5 lb 6.3 oz) Last BM Date: 04/18/13  Intake/Output from previous day: 04/21 0701 - 04/22 0700 In: 1162.9 [P.O.:600; I.V.:262.9; IV Piggyback:300] Out: -   PHYSICAL EXAM General appearance: alert, cooperative and mild distress Resp: rhonchi bilaterally Cardio: irregularly irregular rhythm GI: soft, non-tender; bowel sounds normal; no masses,  no organomegaly Extremities: extremities normal, atraumatic, no cyanosis or edema  Lab Results:    Basic Metabolic Panel:  Recent Labs  13/24/40 0950 04/19/13 1613  NA 139  --   K 4.2  --   CL 101  --   CO2 27  --   GLUCOSE 146* 464*  BUN 19  --   CREATININE 1.23  --   CALCIUM 9.3  --    Liver Function Tests: No results found for this basename: AST, ALT, ALKPHOS, BILITOT, PROT, ALBUMIN,  in the last 72 hours No results found for this basename: LIPASE, AMYLASE,  in the last 72 hours No results found for this basename: AMMONIA,  in the last 72 hours CBC:  Recent Labs  04/19/13 0950  WBC 10.9*  NEUTROABS 9.2*  HGB 11.8*  HCT 36.2*  MCV 94.3  PLT 113*   Cardiac Enzymes:  Recent Labs  04/19/13 0950  TROPONINI <0.30   BNP:  Recent Labs  04/19/13 0950  PROBNP 3127.0*   D-Dimer: No results found for this basename: DDIMER,  in the last  72 hours CBG:  Recent Labs  04/19/13 2158 04/20/13 0734 04/20/13 1206 04/20/13 1700 04/20/13 2207 04/21/13 0731  GLUCAP 395* 214* 266* 279* 220* 253*   Hemoglobin A1C:  Recent Labs  04/20/13 0851  HGBA1C 5.8*   Fasting Lipid Panel: No results found for this basename: CHOL, HDL, LDLCALC, TRIG, CHOLHDL, LDLDIRECT,  in the last 72 hours Thyroid Function Tests: No results found for this basename: TSH, T4TOTAL, FREET4, T3FREE, THYROIDAB,  in the last 72 hours Anemia Panel: No results found for this basename: VITAMINB12, FOLATE, FERRITIN, TIBC, IRON, RETICCTPCT,  in the last 72 hours Coagulation: No results found for this basename: LABPROT, INR,  in the last 72 hours Urine Drug Screen: Drugs of Abuse  No results found for this basename: labopia, cocainscrnur, labbenz, amphetmu, thcu, labbarb    Alcohol Level: No results found for this basename: ETH,  in the last 72 hours Urinalysis: No results found for this basename: COLORURINE, APPERANCEUR, LABSPEC, PHURINE, GLUCOSEU, HGBUR, BILIRUBINUR, KETONESUR, PROTEINUR, UROBILINOGEN, NITRITE, LEUKOCYTESUR,  in the last 72 hours Misc. Labs:  ABGS No results found for this basename: PHART, PCO2, PO2ART, TCO2, HCO3,  in the last 72 hours CULTURES Recent Results (from the past 240 hour(s))  MRSA PCR SCREENING     Status: None   Collection Time    04/19/13  3:08 PM  Result Value Range Status   MRSA by PCR NEGATIVE  NEGATIVE Final   Comment:            The GeneXpert MRSA Assay (FDA     approved for NASAL specimens     only), is one component of a     comprehensive MRSA colonization     surveillance program. It is not     intended to diagnose MRSA     infection nor to guide or     monitor treatment for     MRSA infections.   Studies/Results: Dg Chest 2 View  04/19/2013  *RADIOLOGY REPORT*  Clinical Data: Shortness of breath, dizziness  CHEST - 2 VIEW  Comparison: 02/03/2012  Findings: Chronic interstitial markings.  No focal  consolidation. No pleural effusion or pneumothorax.  The heart is top normal in size.  Prosthetic aortic valve.  Degenerative changes of the visualized thoracolumbar spine.  IMPRESSION: No evidence of acute cardiopulmonary disease.   Original Report Authenticated By: Charline Bills, M.D.     Medications:  Prior to Admission:  Prescriptions prior to admission  Medication Sig Dispense Refill  . acetaminophen (TYLENOL) 500 MG tablet Take 1,000 mg by mouth every 6 (six) hours as needed. Pain      . allopurinol (ZYLOPRIM) 300 MG tablet Take 300 mg by mouth daily.       . Ascorbic Acid (VITAMIN C) 1000 MG tablet Take 1,000 mg by mouth daily.      Marland Kitchen aspirin 81 MG tablet Take 81 mg by mouth daily.        Marland Kitchen diltiazem (DILACOR XR) 240 MG 24 hr capsule Take 240 mg by mouth daily.       . fish oil-omega-3 fatty acids 1000 MG capsule Take 1 g by mouth daily.      . fluticasone (FLOVENT HFA) 110 MCG/ACT inhaler Inhale 1 puff into the lungs 2 (two) times daily.      . furosemide (LASIX) 40 MG tablet Take 40 mg by mouth daily.        Marland Kitchen glyBURIDE (DIABETA) 2.5 MG tablet Take 2.5 mg by mouth daily.        Marland Kitchen ipratropium-albuterol (DUONEB) 0.5-2.5 (3) MG/3ML SOLN Take 3 mLs by nebulization 4 (four) times daily.      Marland Kitchen lisinopril (PRINIVIL,ZESTRIL) 10 MG tablet Take 10 mg by mouth daily.       Marland Kitchen neomycin-polymyxin-hydrocortisone (CORTISPORIN) otic solution Place 3 drops into both ears daily as needed. Earaches      . potassium chloride SA (K-DUR,KLOR-CON) 20 MEQ tablet Take 20 mEq by mouth daily.         Scheduled: . allopurinol  300 mg Oral Daily  . antiseptic oral rinse  15 mL Mouth Rinse BID  . aspirin EC  81 mg Oral Daily  . azithromycin  500 mg Intravenous Q24H  . cefTRIAXone (ROCEPHIN) IVPB 1 gram/50 mL D5W  1 g Intravenous Q24H  . enoxaparin (LOVENOX) injection  40 mg Subcutaneous Q24H  . furosemide  40 mg Oral Daily  . glyBURIDE  2.5 mg Oral Daily  . insulin aspart  0-20 Units Subcutaneous TID WC   . insulin aspart  0-5 Units Subcutaneous QHS  . insulin glargine  8 Units Subcutaneous Daily  . ipratropium  0.5 mg Nebulization Q4H while awake  . levalbuterol  1.25 mg Nebulization Q4H while awake  . lisinopril  10 mg Oral Daily  . methylPREDNISolone (SOLU-MEDROL) injection  40 mg Intravenous Q6H  . omega-3  acid ethyl esters  1 g Oral Daily  . potassium chloride SA  20 mEq Oral Daily  . vitamin C  1,000 mg Oral Daily   Continuous: . diltiazem (CARDIZEM) infusion 10 mg/hr (04/21/13 0600)   NWG:[id number], ALPRAZolam, traZODone  Assesment: He was admitted with acute exacerbation of COPD and this has been complicated by atrial fibrillation with rapid ventricular response. His blood sugar is not well controlled. Principal Problem:   Acute exacerbation of chronic obstructive pulmonary disease (COPD) Active Problems:   Aortic valve disorders   Atrial fibrillation   COPD (chronic obstructive pulmonary disease)   Hypertension   Renal insufficiency    Plan: I added Lantus. He will continue on IV diltiazem for now. Cardiology consultation has been requested.    LOS: 2 days   Guilianna Mckoy L 04/21/2013, 8:47 AM

## 2013-04-21 NOTE — Clinical Documentation Improvement (Signed)
RESPIRATORY FAILURE DOCUMENTATION CLARIFICATION QUERY   THIS DOCUMENT IS NOT A PERMANENT PART OF THE MEDICAL RECORD  TO RESPOND TO THE THIS QUERY, FOLLOW THE INSTRUCTIONS BELOW:  1. If needed, update documentation for the patient's encounter via the notes activity.  2. Access this query again and click edit on the In [employer].  3. After updating, or not, click F2 to complete all highlighted (required) fields concerning your review. Select "additional documentation in the medical record" OR "no additional documentation provided".  4. Click Sign note button.  5. The deficiency will fall out of your In Basket *Please let us know if you are not able to complete this workflow by phone or e-mail (listed below).  Please update your documentation within the medical record to reflect your response to this query.                                                                                    04/21/13  Dear Dr. [name] Marton Redwood,  In a better effort to capture your patient's severity of illness, reflect appropriate length of stay and utilization of resources, a review of the patient medical record has revealed the following indicators.    Based on your clinical judgment, please clarify and document in a progress note and/or discharge summary the clinical condition associated with the following supporting information:  In responding to this query please exercise your independent judgment.  The fact that a query is asked, does not imply that any particular answer is desired or expected.  Possible Clinical Conditions?  Acute Respiratory Failure Acute on Chronic Respiratory Failure Chronic Respiratory Failure Other Condition________________ Cannot Clinically Determine    Supporting Information:  Risk Factors: CHF Acute exacerbation COPD Increased need for prn Oxygen at home  Signs&Symptoms: Shortness of breath Dizziness Persistent cough Diminished lung sounds Faint  wheezes  Diagnostics:  Radiology: CXR=IMPRESSION:  Enlargement of cardiac silhouette.  Emphysematous changes consistent with COPD.  No acute infiltrate.    Treatment: Oxygen: O2 via [place] IV steroids & continuous nebs on admission Bipap Keep O2 sats >90% You may use possible, probable, or suspect with inpatient documentation. possible, probable, suspected diagnoses MUST be documented at the time of discharge  Reviewed: additional documentation in the medical record  Thank You,  Debora T Williams RN, MSN Clinical Documentation Specialist: Office# [phone] [facility] Health Information Management [place]

## 2013-04-21 NOTE — Consult Note (Addendum)
CARDIOLOGY CONSULT NOTE  Patient ID: [name] [name] MRN: [id number] DOB/AGE: 1936/10/08 77 y.o.  Admit date: 04/19/2013 Referring Physician: [name] Primary Herb Grays, MD Primary Cardiologist: Swaziland, Peter, MD Reason for Consultation: New onset Atrial fibrillation with RVR  Principal Problem:   Acute exacerbation of chronic obstructive pulmonary disease (COPD) Active Problems:   Aortic valve disorders   Atrial fibrillation   COPD (chronic obstructive pulmonary disease)   Hypertension   Renal insufficiency   Diabetes  HPI: [name] is a 77 y/o patient admitted with COPD exacerbation, with new onset atrial fibriallion which has not been rate controlled during hospitalization. Patient also has a history of aortic valve replacement, with tissue prosthesis and left-sided MAZE procedure in August of 2011 following cardiac catheterization demonstrating normal coronaries without stenosis,with  severe aortic valve stenosis. He is followed by Dr. Peter Swaziland in [place]. He was last seen by Dr. Swaziland in October of 2013 and was in normal sinus rhythm at that time. We are asked for management of atrial fib with RVR .     Is currently on a Cardizem drip at 10 mg an hour with heart rate in the 130s. A cardiogram was completed on 04/20/2013 demonstrating normal LVEF of 55-60%, mild LVH, the right atrium was moderately dilated. Pro BNP was elevated at 3127, cardiac enzymes are found be negative. The left atrium is also moderately dilated. Patient denies chest pain, dizziness, edema or nausea. Breathing status is still difficult for him.  Review of systems complete and found to be negative unless listed above   Past Medical History  Diagnosis Date  . COPD (chronic obstructive pulmonary disease)   . AF (atrial fibrillation)   . Hypertension   . Gout   . Arthritis   . Pneumonia     history of pneumonia  . Small bowel obstruction   . Renal insufficiency   . Aortic stenosis   .  Pneumonia 10/12  . Hydropneumothorax   . Diabetes mellitus   . CHF (congestive heart failure)   . On home O2     qhs prn    Family History  Problem Relation Age of Onset  . Hypertension Mother   . Stroke Mother   . Heart disease Sister   . Kidney disease Sister     History   Social History  . Marital Status: Married    Spouse Name: N/A    Number of Children: N/A  . Years of Education: N/A   Occupational History  . Not on file.   Social History Main Topics  . Smoking status: Former Smoker    Types: Cigarettes    Quit date: 01/18/2005  . Smokeless tobacco: Not on file  . Alcohol Use: No  . Drug Use: No  . Sexually Active: Yes    Birth Control/ Protection: None   Other Topics Concern  . Not on file   Social History Narrative  . No narrative on file    Past Surgical History  Procedure Laterality Date  . Rotator cuff repair      left and right  . Cardiac valve replacement      aortic valve with a tissue prosthesis and left sided maze proc  . S/p rewiring of sternum for dehiscence    . Gallbladder surgery    . US echocardiography  01-03-11    EF 55-60%  . Cardiovascular stress test  01-26-09    EF 67%  . Coronary angioplasty    . Cholecystectomy    .  Sternal incision reclosure  08/29/2010    sternal rewiring  . Cataract extraction w/phaco  10/02/2012    Procedure: CATARACT EXTRACTION PHACO AND INTRAOCULAR LENS PLACEMENT (IOC);  Surgeon: Gemma Payor, MD;  Location: [facility];  Service: Ophthalmology;  Laterality: Right;  CDE 16.68  . Cataract extraction w/phaco  10/27/2012    Procedure: CATARACT EXTRACTION PHACO AND INTRAOCULAR LENS PLACEMENT (IOC);  Surgeon: Gemma Payor, MD;  Location: [facility];  Service: Ophthalmology;  Laterality: Left;  CDE:16.89  . Artificial aortic valve       Prescriptions prior to admission  Medication Sig Dispense Refill  . acetaminophen (TYLENOL) 500 MG tablet Take 1,000 mg by mouth every 6 (six) hours as needed. Pain      . allopurinol  (ZYLOPRIM) 300 MG tablet Take 300 mg by mouth daily.       . Ascorbic Acid (VITAMIN C) 1000 MG tablet Take 1,000 mg by mouth daily.      Marland Kitchen aspirin 81 MG tablet Take 81 mg by mouth daily.        Marland Kitchen diltiazem (DILACOR XR) 240 MG 24 hr capsule Take 240 mg by mouth daily.       . fish oil-omega-3 fatty acids 1000 MG capsule Take 1 g by mouth daily.      . fluticasone (FLOVENT HFA) 110 MCG/ACT inhaler Inhale 1 puff into the lungs 2 (two) times daily.      . furosemide (LASIX) 40 MG tablet Take 40 mg by mouth daily.        Marland Kitchen glyBURIDE (DIABETA) 2.5 MG tablet Take 2.5 mg by mouth daily.        Marland Kitchen ipratropium-albuterol (DUONEB) 0.5-2.5 (3) MG/3ML SOLN Take 3 mLs by nebulization 4 (four) times daily.      Marland Kitchen lisinopril (PRINIVIL,ZESTRIL) 10 MG tablet Take 10 mg by mouth daily.       Marland Kitchen neomycin-polymyxin-hydrocortisone (CORTISPORIN) otic solution Place 3 drops into both ears daily as needed. Earaches      . potassium chloride SA (K-DUR,KLOR-CON) 20 MEQ tablet Take 20 mEq by mouth daily.         Physical Exam: Blood pressure 117/65, pulse 63, temperature 97.8 F (36.6 C), temperature source Oral, resp. rate 22, height 5\' 6"  (1.676 m), weight 187 lb 6.3 oz (85 kg), SpO2 98.00%.  General: Well developed, well nourished, in no acute distress Head: Eyes PERRLA, No xanthomas.   Normal cephalic and atramatic  Lungs:Diminished bilaterally, without wheezes. Heart: HRIR rapid,  without MRG.  Pulses are 2+ & equal.            No carotid bruit. No JVD.  No abdominal bruits. No femoral bruits. Abdomen: Bowel sounds are positive, abdomen soft and non-tender without masses Msk:  Back normal, normal gait. Normal strength and tone for age. Extremities: No clubbing, cyanosis or edema.  DP +1 Neuro: Alert and oriented X 3. Psych:  Good affect, responds appropriately   Lab Results  Component Value Date   WBC 10.9* 04/19/2013   HGB 11.8* 04/19/2013   HCT 36.2* 04/19/2013   MCV 94.3 04/19/2013   PLT 113* 04/19/2013      Recent Labs Lab 04/19/13 0950 04/19/13 1613  NA 139  --   K 4.2  --   CL 101  --   CO2 27  --   BUN 19  --   CREATININE 1.23  --   CALCIUM 9.3  --   GLUCOSE 146* 464*   Lab Results  Component Value Date  TROPONINI <0.30 04/19/2013   Recent Labs  04/19/13 0950  PROBNP 3127.0*   Radiology: Dg Chest 2 View  04/19/2013  *RADIOLOGY REPORT*  Clinical Data: Shortness of breath, dizziness  CHEST - 2 VIEW  Comparison: 02/03/2012  Findings: Chronic interstitial markings.  No focal consolidation. No pleural effusion or pneumothorax.  The heart is top normal in size.  Prosthetic aortic valve.  Degenerative changes of the visualized thoracolumbar spine.  IMPRESSION: No evidence of acute cardiopulmonary disease.   Original Report Authenticated By: Charline Bills, M.D.    EKG: Atrial flutter rate of 130 beats per minute with nonspecific inferior lateral T-wave abnormalities.  ASSESSMENT AND PLAN:   1. Atrial flutter with RVR: In the setting of COPD exacerbation. Currently on Cardizem drip at 10 mg an hour; heart rate is inadequately controlled, with rates in the 130s. Have spoken with Dr. Graciela Husbands in [facility] concerning use of Flecainide. He agrees that this would be a reasonable treatment in this setting without history of CAD. Recommends an dose of 300 mg now. Suggests possible DCCV if dose not effective. Repeat CXR.   2. S/P bioprosthetic AVR with MAZE procedure: Completed in August of 2011, after cardiac cath demonstrating normal coronaries.   3. COPD Exacerbation: Improving but not at optimal breathing status. Continues on Xopenex,steriods and antibiotics per Dr. [name] which may be contributing to elevated HR.   4. Hypertension: Low normal with IV cardizem, and lisinopril.   Bettey Mare. Lyman Bishop NP [facility] 04/21/2013, 9:02 AM  Cardiology Attending Patient interviewed and examined. Discussed with Joni Reining, NP.  Above note annotated and modified based upon my  findings.  Rhythm currently regular at a heart rate of 115 bpm-atrial flutter with 2-1 AV block. Principal issue is bronchitis with respiratory insufficiency managed by Dr. [name]. Patient reports only modest improvement so far. Due to substantial bronchospasm, treatment with beta blocker in not feasible.  I will initiate loading with digoxin to further control heart rate. Would not continue antiarrhythmic for the time being, as cardioversion was not achieved; however, flutter rate has slowed, and there may be a place for antiarrhythmic therapy.  Risk factors for thromboembolism include age greater than 82 and hypertension.  Full anticoagulation will be initiated with rivaroxaban.  Aspirin and enoxaparin will be discontinued.  [place] Bing, MD 04/21/2013, 4:48 PM

## 2013-04-22 ENCOUNTER — Inpatient Hospital Stay (HOSPITAL_COMMUNITY): Payer: Medicare Other

## 2013-04-22 DIAGNOSIS — J441 Chronic obstructive pulmonary disease with (acute) exacerbation: Principal | ICD-10-CM

## 2013-04-22 DIAGNOSIS — I4892 Unspecified atrial flutter: Secondary | ICD-10-CM

## 2013-04-22 LAB — GLUCOSE, CAPILLARY
Glucose-Capillary: 205 mg/dL — ABNORMAL HIGH (ref 70–99)
Glucose-Capillary: 178 mg/dL — ABNORMAL HIGH (ref 70–99)

## 2013-04-22 MED ORDER — INSULIN GLARGINE 100 UNIT/ML ~~LOC~~ SOLN
15.0000 [IU] | Freq: Every day | SUBCUTANEOUS | Status: DC
  Administered 2013-04-22 – 2013-04-29 (×8): 15 [IU] via SUBCUTANEOUS
  Filled 2013-04-22 (×10): qty 0.15

## 2013-04-22 MED ORDER — AZITHROMYCIN 250 MG PO TABS
500.0000 mg | ORAL_TABLET | Freq: Every day | ORAL | Status: DC
  Administered 2013-04-22: 500 mg via ORAL
  Filled 2013-04-22: qty 2

## 2013-04-22 MED ORDER — DIGOXIN 250 MCG PO TABS
0.2500 mg | ORAL_TABLET | Freq: Every day | ORAL | Status: DC
  Administered 2013-04-22: 0.25 mg via ORAL
  Filled 2013-04-22: qty 1

## 2013-04-22 MED ORDER — ONDANSETRON HCL 4 MG/2ML IJ SOLN
4.0000 mg | Freq: Four times a day (QID) | INTRAMUSCULAR | Status: DC | PRN
  Administered 2013-04-26: 4 mg via INTRAVENOUS
  Filled 2013-04-22: qty 2

## 2013-04-22 MED ORDER — DILTIAZEM HCL 100 MG IV SOLR
5.0000 mg/h | INTRAVENOUS | Status: DC
  Administered 2013-04-22: 5 mg/h via INTRAVENOUS
  Administered 2013-04-23 (×2): 10 mg/h via INTRAVENOUS
  Administered 2013-04-23: 15 mg/h via INTRAVENOUS
  Administered 2013-04-24 – 2013-04-25 (×5): 10 mg/h via INTRAVENOUS

## 2013-04-22 MED ORDER — DILTIAZEM HCL 60 MG PO TABS
90.0000 mg | ORAL_TABLET | Freq: Four times a day (QID) | ORAL | Status: DC
  Administered 2013-04-22 (×2): 90 mg via ORAL
  Filled 2013-04-22 (×3): qty 1

## 2013-04-22 NOTE — Progress Notes (Signed)
   Primary cardiologist: Dr. Peter Swaziland Consulting cardiologist: Dr. [place] Bing  Subjective:  Vomited on phlegm and feels better. Sleeping heart rate 115-atrial flutter.  Objective:  Vital Signs in the last 24 hours: Temp:  [97.9 F (36.6 C)-98.2 F (36.8 C)] 97.9 F (36.6 C) (04/23 0506) Pulse Rate:  [35-139] 35 (04/23 0645) Resp:  [15-24] 17 (04/23 0645) BP: (99-175)/(63-97) 169/83 mmHg (04/23 0645) SpO2:  [65 %-100 %] 92 % (04/23 0717)  Intake/Output from previous day: 04/22 0701 - 04/23 0700 In: 810.9 [P.O.:240; I.V.:270.9; IV Piggyback:300] Out: 800 [Urine:800]  Physical Exam: NECK: Without JVD, HJR, or bruit LUNGS: decreased breath sounds throughout with diffuse rhonchi throughout HEART: Irregular rate and rhythm at 115/m, no murmur, gallop, rub, bruit, thrill, or heave EXTREMITIES: Without cyanosis, clubbing, or edema  Lab Results:  Recent Labs  04/19/13 0950  WBC 10.9*  HGB 11.8*  PLT 113*    Recent Labs  04/19/13 0950 04/19/13 1613  NA 139  --   K 4.2  --   CL 101  --   CO2 27  --   GLUCOSE 146* 464*  BUN 19  --   CREATININE 1.23  --     Recent Labs  04/19/13 0950  TROPONINI <0.30    Assessment/Plan:    LOS: 3 days   1. Atrial flutter with RVR: In the setting of COPD exacerbation. Currently on Cardizem  90 mg Q 6 hr and Digoxin 0.25mg  daily after a load.; heart rate is not optimally controlled. CHADS2 score is 2.   2. S/P bioprosthetic AVR with MAZE procedure: Completed in August of 2011, after cardiac cath demonstrating normal coronaries.   3. COPD Exacerbation: Improving but not at optimal breathing status. Continues on Xopenex, steriods and antibiotics per Dr. [name] which may be contributing to elevated HR.   4. Hypertension: BP up and down.  Jacolyn Reedy 04/22/2013, 8:08 AM   Attending note:  Patient seen and examined. Discussed with Ms. Geni [facility]. Mr. Chisolm remains in atrial flutter, which he looks to have been in  since admission based on ECG review. He reports no prior history of atrial arrhythmias. He is concurrently being managed for COPD exacerbation.  Patient was seen in consultation by Dr. Dietrich Pates yesterday. Plan at this point is rate control and anticoagulation with Xarelto. He did receive a single dose of flecainide 300 mg although did not convert with this intervention. I suspect that he will ultimately need a TEE DCCV, particularly as rate control of his atrial flutter will likely be difficult, and it is not likely that he will convert spontaneously. Whether or not he will require long-term anti-arrhythmic is yet to be determined. Flecainide would not be unreasonable with documentation of normal coronaries.  He is being switched to oral Cardizem today, will also continue digoxin.  Jonelle Sidle, M.D., F.A.C.C.

## 2013-04-22 NOTE — Progress Notes (Signed)
PHARMACIST - PHYSICIAN COMMUNICATION DR:   [name] CONCERNING: Antibiotic IV to Oral Route Change Policy  RECOMMENDATION: This patient is receiving Zithromax by the intravenous route.  Based on criteria approved by the Pharmacy and Therapeutics Committee, the antibiotic(s) is/are being converted to the equivalent oral dose form(s).   DESCRIPTION: These criteria include:  Patient being treated for a respiratory tract infection, urinary tract infection, or cellulitis  The patient is not neutropenic and does not exhibit a GI malabsorption state  The patient is eating (either orally or via tube) and/or has been taking other orally administered medications for a least 24 hours  The patient is improving clinically and has a Tmax < 100.5  If you have questions about this conversion, please contact the Pharmacy Department  [x]   [phone] )  [facility] []   [phone] )  [facility]  []   [phone] )  [facility] []   [phone] )  [facility]   Junita Push, PharmD, BCPS 04/22/2013@12 :16 PM

## 2013-04-22 NOTE — Progress Notes (Signed)
Subjective: He says he feels better. He has no new complaints. It looks like he is in atrial flutter at this point. He is still on a diltiazem continuous infusion.  Objective: Vital signs in last 24 hours: Temp:  [97.9 F (36.6 C)-98.2 F (36.8 C)] 97.9 F (36.6 C) (04/23 0506) Pulse Rate:  [35-139] 111 (04/23 0800) Resp:  [15-24] 23 (04/23 0800) BP: (99-175)/(63-97) 154/79 mmHg (04/23 0812) SpO2:  [65 %-100 %] 92 % (04/23 0816) Weight change:  Last BM Date: 04/20/13  Intake/Output from previous day: 04/22 0701 - 04/23 0700 In: 810.9 [P.O.:240; I.V.:270.9; IV Piggyback:300] Out: 800 [Urine:800]  PHYSICAL EXAM General appearance: alert, cooperative and no distress Resp: clear to auscultation bilaterally Cardio: irregularly irregular rhythm GI: soft, non-tender; bowel sounds normal; no masses,  no organomegaly Extremities: extremities normal, atraumatic, no cyanosis or edema  Lab Results:    Basic Metabolic Panel:  Recent Labs  16/10/96 0950 04/19/13 1613  NA 139  --   K 4.2  --   CL 101  --   CO2 27  --   GLUCOSE 146* 464*  BUN 19  --   CREATININE 1.23  --   CALCIUM 9.3  --    Liver Function Tests: No results found for this basename: AST, ALT, ALKPHOS, BILITOT, PROT, ALBUMIN,  in the last 72 hours No results found for this basename: LIPASE, AMYLASE,  in the last 72 hours No results found for this basename: AMMONIA,  in the last 72 hours CBC:  Recent Labs  04/19/13 0950  WBC 10.9*  NEUTROABS 9.2*  HGB 11.8*  HCT 36.2*  MCV 94.3  PLT 113*   Cardiac Enzymes:  Recent Labs  04/19/13 0950  TROPONINI <0.30   BNP:  Recent Labs  04/19/13 0950  PROBNP 3127.0*   D-Dimer: No results found for this basename: DDIMER,  in the last 72 hours CBG:  Recent Labs  04/20/13 1700 04/20/13 2207 04/21/13 0731 04/21/13 1209 04/21/13 1645 04/21/13 1957  GLUCAP 279* 220* 253* 220* 296* 191*   Hemoglobin A1C:  Recent Labs  04/20/13 0851  HGBA1C 5.8*    Fasting Lipid Panel: No results found for this basename: CHOL, HDL, LDLCALC, TRIG, CHOLHDL, LDLDIRECT,  in the last 72 hours Thyroid Function Tests: No results found for this basename: TSH, T4TOTAL, FREET4, T3FREE, THYROIDAB,  in the last 72 hours Anemia Panel: No results found for this basename: VITAMINB12, FOLATE, FERRITIN, TIBC, IRON, RETICCTPCT,  in the last 72 hours Coagulation: No results found for this basename: LABPROT, INR,  in the last 72 hours Urine Drug Screen: Drugs of Abuse  No results found for this basename: labopia, cocainscrnur, labbenz, amphetmu, thcu, labbarb    Alcohol Level: No results found for this basename: ETH,  in the last 72 hours Urinalysis: No results found for this basename: COLORURINE, APPERANCEUR, LABSPEC, PHURINE, GLUCOSEU, HGBUR, BILIRUBINUR, KETONESUR, PROTEINUR, UROBILINOGEN, NITRITE, LEUKOCYTESUR,  in the last 72 hours Misc. Labs:  ABGS No results found for this basename: PHART, PCO2, PO2ART, TCO2, HCO3,  in the last 72 hours CULTURES Recent Results (from the past 240 hour(s))  MRSA PCR SCREENING     Status: None   Collection Time    04/19/13  3:08 PM      Result Value Range Status   MRSA by PCR NEGATIVE  NEGATIVE Final   Comment:            The GeneXpert MRSA Assay (FDA     approved for NASAL specimens  only), is one component of a     comprehensive MRSA colonization     surveillance program. It is not     intended to diagnose MRSA     infection nor to guide or     monitor treatment for     MRSA infections.   Studies/Results: Dg Chest Port 1 View  04/21/2013  *RADIOLOGY REPORT*  Clinical Data: CHF, history COPD, hypertension, diabetes  PORTABLE CHEST - 1 VIEW  Comparison: Portable exam 1036 hours compared to 04/19/2013  Findings: Enlargement of cardiac silhouette post median sternotomy. Mediastinal contours and pulmonary vascularity normal. Lordotic positioning with underlying emphysematous changes noted. No definite acute  infiltrate, pleural effusion, or pneumothorax. No acute osseous findings.  IMPRESSION: Enlargement of cardiac silhouette. Emphysematous changes consistent with COPD. No acute infiltrate.   Original Report Authenticated By: Ulyses Southward, M.D.     Medications:  Prior to Admission:  Prescriptions prior to admission  Medication Sig Dispense Refill  . acetaminophen (TYLENOL) 500 MG tablet Take 1,000 mg by mouth every 6 (six) hours as needed. Pain      . allopurinol (ZYLOPRIM) 300 MG tablet Take 300 mg by mouth daily.       . Ascorbic Acid (VITAMIN C) 1000 MG tablet Take 1,000 mg by mouth daily.      Marland Kitchen aspirin 81 MG tablet Take 81 mg by mouth daily.        Marland Kitchen diltiazem (DILACOR XR) 240 MG 24 hr capsule Take 240 mg by mouth daily.       . fish oil-omega-3 fatty acids 1000 MG capsule Take 1 g by mouth daily.      . fluticasone (FLOVENT HFA) 110 MCG/ACT inhaler Inhale 1 puff into the lungs 2 (two) times daily.      . furosemide (LASIX) 40 MG tablet Take 40 mg by mouth daily.        Marland Kitchen glyBURIDE (DIABETA) 2.5 MG tablet Take 2.5 mg by mouth daily.        Marland Kitchen ipratropium-albuterol (DUONEB) 0.5-2.5 (3) MG/3ML SOLN Take 3 mLs by nebulization 4 (four) times daily.      Marland Kitchen lisinopril (PRINIVIL,ZESTRIL) 10 MG tablet Take 10 mg by mouth daily.       Marland Kitchen neomycin-polymyxin-hydrocortisone (CORTISPORIN) otic solution Place 3 drops into both ears daily as needed. Earaches      . potassium chloride SA (K-DUR,KLOR-CON) 20 MEQ tablet Take 20 mEq by mouth daily.         Scheduled: . allopurinol  300 mg Oral Daily  . antiseptic oral rinse  15 mL Mouth Rinse BID  . azithromycin  500 mg Intravenous Q24H  . cefTRIAXone (ROCEPHIN) IVPB 1 gram/50 mL D5W  1 g Intravenous Q24H  . diltiazem  90 mg Oral Q6H  . furosemide  40 mg Oral Daily  . glyBURIDE  2.5 mg Oral Daily  . insulin aspart  0-20 Units Subcutaneous TID WC  . insulin aspart  0-5 Units Subcutaneous QHS  . insulin glargine  15 Units Subcutaneous Daily  . ipratropium   0.5 mg Nebulization Q4H while awake  . levalbuterol  1.25 mg Nebulization Q4H while awake  . lisinopril  10 mg Oral Daily  . methylPREDNISolone (SOLU-MEDROL) injection  40 mg Intravenous Q6H  . metoprolol  5 mg Intravenous Once  . omega-3 acid ethyl esters  1 g Oral Daily  . potassium chloride SA  20 mEq Oral Daily  . rivaroxaban  20 mg Oral Q supper  . vitamin C  1,000 mg Oral Daily   Continuous:  ZOX:[id number], ALPRAZolam, traZODone  Assesment: He was admitted with acute exacerbation of COPD and has had atrial flutter and fibrillation from that. He seems better. Principal Problem:   Acute exacerbation of chronic obstructive pulmonary disease (COPD) Active Problems:   Aortic valve disorders   Atrial flutter   COPD (chronic obstructive pulmonary disease)   Hypertension   Renal insufficiency   Diabetes    Plan: I'm going to stop his diltiazem infusion switching to oral diltiazem and moving from the step down    LOS: 3 days   Leith Szafranski L 04/22/2013, 9:07 AM

## 2013-04-22 NOTE — Progress Notes (Signed)
Scabbed areas to face, arms, legs. Per patient Dr. Margo Aye removed skin cancers.  Some covered with bandaids, others open to air

## 2013-04-23 ENCOUNTER — Inpatient Hospital Stay (HOSPITAL_COMMUNITY): Payer: Medicare Other

## 2013-04-23 DIAGNOSIS — J449 Chronic obstructive pulmonary disease, unspecified: Secondary | ICD-10-CM

## 2013-04-23 LAB — BASIC METABOLIC PANEL
CO2: 33 mEq/L — ABNORMAL HIGH (ref 19–32)
Calcium: 9.3 mg/dL (ref 8.4–10.5)
Creatinine, Ser: 2.01 mg/dL — ABNORMAL HIGH (ref 0.50–1.35)
Glucose, Bld: 232 mg/dL — ABNORMAL HIGH (ref 70–99)
Potassium: 5.3 mEq/L — ABNORMAL HIGH (ref 3.5–5.1)
Sodium: 142 mEq/L (ref 135–145)

## 2013-04-23 LAB — GLUCOSE, CAPILLARY: Glucose-Capillary: 181 mg/dL — ABNORMAL HIGH (ref 70–99)

## 2013-04-23 MED ORDER — DIGOXIN 0.25 MG/ML IJ SOLN
0.2500 mg | Freq: Every day | INTRAMUSCULAR | Status: DC
  Administered 2013-04-23 – 2013-04-24 (×2): 0.25 mg via INTRAVENOUS
  Filled 2013-04-23 (×3): qty 2

## 2013-04-23 MED ORDER — ENOXAPARIN SODIUM 80 MG/0.8ML ~~LOC~~ SOLN
80.0000 mg | SUBCUTANEOUS | Status: DC
  Administered 2013-04-23 – 2013-04-27 (×5): 80 mg via SUBCUTANEOUS
  Filled 2013-04-23 (×5): qty 0.8

## 2013-04-23 MED ORDER — INSULIN ASPART 100 UNIT/ML ~~LOC~~ SOLN
0.0000 [IU] | Freq: Four times a day (QID) | SUBCUTANEOUS | Status: DC
  Administered 2013-04-24 (×3): 4 [IU] via SUBCUTANEOUS
  Administered 2013-04-24 – 2013-04-25 (×2): 7 [IU] via SUBCUTANEOUS
  Administered 2013-04-25: 11 [IU] via SUBCUTANEOUS
  Administered 2013-04-25: 7 [IU] via SUBCUTANEOUS
  Administered 2013-04-25 – 2013-04-26 (×4): 4 [IU] via SUBCUTANEOUS
  Administered 2013-04-26 (×2): 11 [IU] via SUBCUTANEOUS
  Administered 2013-04-27: 4 [IU] via SUBCUTANEOUS
  Administered 2013-04-27: 7 [IU] via SUBCUTANEOUS
  Administered 2013-04-27: 3 [IU] via SUBCUTANEOUS
  Administered 2013-04-28: 7 [IU] via SUBCUTANEOUS
  Administered 2013-04-28 (×2): 4 [IU] via SUBCUTANEOUS

## 2013-04-23 MED ORDER — SODIUM CHLORIDE 0.9 % IV BOLUS (SEPSIS)
1000.0000 mL | Freq: Once | INTRAVENOUS | Status: AC
  Administered 2013-04-23: 1000 mL via INTRAVENOUS

## 2013-04-23 NOTE — Progress Notes (Signed)
Subjective: His nausea and vomiting that started yesterday afternoon to be from small bowel obstruction. He had a previous small bowel obstruction several years ago that resolved without surgery he has a nasogastric tube in and feels better  Objective: Vital signs in last 24 hours: Temp:  [98.1 F (36.7 C)-98.8 F (37.1 C)] 98.3 F (36.8 C) (04/24 0400) Pulse Rate:  [36-127] 121 (04/24 0630) Resp:  [14-27] 18 (04/24 0630) BP: (99-181)/(42-131) 117/58 mmHg (04/24 0630) SpO2:  [68 %-100 %] 88 % (04/24 0630) Weight:  [82.2 kg (181 lb 3.5 oz)] 82.2 kg (181 lb 3.5 oz) (04/24 0500) Weight change:  Last BM Date: 04/20/13  Intake/Output from previous day: 04/23 0701 - 04/24 0700 In: 212.7 [I.V.:162.7; IV Piggyback:50] Out: 2225 [Urine:1375; Emesis/NG output:850]  PHYSICAL EXAM General appearance: alert, cooperative and moderate distress Resp: clear to auscultation bilaterally Cardio: irregularly irregular rhythm GI: Less distended than yesterday afternoon with positive bowel sounds Extremities: extremities normal, atraumatic, no cyanosis or edema  Lab Results:    Basic Metabolic Panel:  Recent Labs  78/29/56 0330  NA 142  K 5.3*  CL 99  CO2 33*  GLUCOSE 232*  BUN 77*  CREATININE 2.01*  CALCIUM 9.3   Liver Function Tests: No results found for this basename: AST, ALT, ALKPHOS, BILITOT, PROT, ALBUMIN,  in the last 72 hours No results found for this basename: LIPASE, AMYLASE,  in the last 72 hours No results found for this basename: AMMONIA,  in the last 72 hours CBC: No results found for this basename: WBC, NEUTROABS, HGB, HCT, MCV, PLT,  in the last 72 hours Cardiac Enzymes: No results found for this basename: CKTOTAL, CKMB, CKMBINDEX, TROPONINI,  in the last 72 hours BNP: No results found for this basename: PROBNP,  in the last 72 hours D-Dimer: No results found for this basename: DDIMER,  in the last 72 hours CBG:  Recent Labs  04/21/13 1957 04/22/13 0749  04/22/13 1127 04/22/13 1623 04/22/13 2141 04/23/13 0731  GLUCAP 191* 223* 187* 205* 178* 226*   Hemoglobin A1C: No results found for this basename: HGBA1C,  in the last 72 hours Fasting Lipid Panel: No results found for this basename: CHOL, HDL, LDLCALC, TRIG, CHOLHDL, LDLDIRECT,  in the last 72 hours Thyroid Function Tests: No results found for this basename: TSH, T4TOTAL, FREET4, T3FREE, THYROIDAB,  in the last 72 hours Anemia Panel: No results found for this basename: VITAMINB12, FOLATE, FERRITIN, TIBC, IRON, RETICCTPCT,  in the last 72 hours Coagulation: No results found for this basename: LABPROT, INR,  in the last 72 hours Urine Drug Screen: Drugs of Abuse  No results found for this basename: labopia, cocainscrnur, labbenz, amphetmu, thcu, labbarb    Alcohol Level: No results found for this basename: ETH,  in the last 72 hours Urinalysis: No results found for this basename: COLORURINE, APPERANCEUR, LABSPEC, PHURINE, GLUCOSEU, HGBUR, BILIRUBINUR, KETONESUR, PROTEINUR, UROBILINOGEN, NITRITE, LEUKOCYTESUR,  in the last 72 hours Misc. Labs:  ABGS No results found for this basename: PHART, PCO2, PO2ART, TCO2, HCO3,  in the last 72 hours CULTURES Recent Results (from the past 240 hour(s))  MRSA PCR SCREENING     Status: None   Collection Time    04/19/13  3:08 PM      Result Value Range Status   MRSA by PCR NEGATIVE  NEGATIVE Final   Comment:            The GeneXpert MRSA Assay (FDA     approved for NASAL specimens  only), is one component of a     comprehensive MRSA colonization     surveillance program. It is not     intended to diagnose MRSA     infection nor to guide or     monitor treatment for     MRSA infections.   Studies/Results: Dg Abd 1 View  04/22/2013  *RADIOLOGY REPORT*  Clinical Data: 77 year old male with abdominal pain and swelling.  ABDOMEN - 1 VIEW  Comparison: 12/12/2011 and earlier.  Findings: Portable supine views of the abdomen and pelvis.   There are gas filled small bowel loops measuring up to 53 mm diameter. Some colonic gas is present.  Grossly stable lung bases.  No definite pneumoperitoneum. No acute osseous abnormality identified.  IMPRESSION: Abnormal gas pattern compatible with early high-grade versus partial small bowel obstruction.   Original Report Authenticated By: Erskine Speed, M.D.    Dg Chest Port 1 View  04/21/2013  *RADIOLOGY REPORT*  Clinical Data: CHF, history COPD, hypertension, diabetes  PORTABLE CHEST - 1 VIEW  Comparison: Portable exam 1036 hours compared to 04/19/2013  Findings: Enlargement of cardiac silhouette post median sternotomy. Mediastinal contours and pulmonary vascularity normal. Lordotic positioning with underlying emphysematous changes noted. No definite acute infiltrate, pleural effusion, or pneumothorax. No acute osseous findings.  IMPRESSION: Enlargement of cardiac silhouette. Emphysematous changes consistent with COPD. No acute infiltrate.   Original Report Authenticated By: Ulyses Southward, M.D.     Medications:  Prior to Admission:  Prescriptions prior to admission  Medication Sig Dispense Refill  . acetaminophen (TYLENOL) 500 MG tablet Take 1,000 mg by mouth every 6 (six) hours as needed. Pain      . allopurinol (ZYLOPRIM) 300 MG tablet Take 300 mg by mouth daily.       . Ascorbic Acid (VITAMIN C) 1000 MG tablet Take 1,000 mg by mouth daily.      Marland Kitchen aspirin 81 MG tablet Take 81 mg by mouth daily.        Marland Kitchen diltiazem (DILACOR XR) 240 MG 24 hr capsule Take 240 mg by mouth daily.       . fish oil-omega-3 fatty acids 1000 MG capsule Take 1 g by mouth daily.      . fluticasone (FLOVENT HFA) 110 MCG/ACT inhaler Inhale 1 puff into the lungs 2 (two) times daily.      . furosemide (LASIX) 40 MG tablet Take 40 mg by mouth daily.        Marland Kitchen glyBURIDE (DIABETA) 2.5 MG tablet Take 2.5 mg by mouth daily.        Marland Kitchen ipratropium-albuterol (DUONEB) 0.5-2.5 (3) MG/3ML SOLN Take 3 mLs by nebulization 4 (four) times daily.       Marland Kitchen lisinopril (PRINIVIL,ZESTRIL) 10 MG tablet Take 10 mg by mouth daily.       Marland Kitchen neomycin-polymyxin-hydrocortisone (CORTISPORIN) otic solution Place 3 drops into both ears daily as needed. Earaches      . potassium chloride SA (K-DUR,KLOR-CON) 20 MEQ tablet Take 20 mEq by mouth daily.         Scheduled: . antiseptic oral rinse  15 mL Mouth Rinse BID  . cefTRIAXone (ROCEPHIN) IVPB 1 gram/50 mL D5W  1 g Intravenous Q24H  . insulin aspart  0-20 Units Subcutaneous TID WC  . insulin aspart  0-5 Units Subcutaneous QHS  . insulin glargine  15 Units Subcutaneous Daily  . ipratropium  0.5 mg Nebulization Q4H while awake  . levalbuterol  1.25 mg Nebulization Q4H while awake  .  methylPREDNISolone (SOLU-MEDROL) injection  40 mg Intravenous Q6H  . vitamin C  1,000 mg Oral Daily   Continuous: . diltiazem (CARDIZEM) infusion 10 mg/hr (04/23/13 0800)   GNF:[id number]  Assesment: He has small bowel obstruction. His renal function is not quite as good. Principal Problem:   Acute exacerbation of chronic obstructive pulmonary disease (COPD) Active Problems:   Aortic valve disorders   Atrial flutter   COPD (chronic obstructive pulmonary disease)   Hypertension   Renal insufficiency   Diabetes    Plan: Discontinue by mouth medications. Hold his anti-coagulation orally for now. Continue NG drainage. Surgical consultation. He will have CT for better documentation    LOS: 4 days   Toniann Dickerson L 04/23/2013, 9:04 AM

## 2013-04-23 NOTE — Evaluation (Signed)
Physical Therapy Evaluation Patient Details Name: [name] [name] MRN: [id number] DOB: 03/24/1936 Today's Date: 04/23/2013 Time: 1191-4782 PT Time Calculation (min): 39 min  PT Assessment / Plan / Recommendation Clinical Impression  Pt is a 77year old male admitted to [facility] for acute COPD and who recieved an NG tube for small bowel obstruction and is referred to PT for general mobility. PLOF: would walk at [place], daughter had him doing functional exercises at home, independent with all mobility.   At this time pt requires supervision and instruction on use of DME for safety with his NG tube.  Ambulation limited due to NG tube.  Pt will likely do well at home with HHPT.      PT Assessment  Patient needs continued PT services    Follow Up Recommendations  Home health PT    Does the patient have the potential to tolerate intense rehabilitation      Barriers to Discharge        Equipment Recommendations  Rolling walker with 5" wheels    Recommendations for Other Services     Frequency Min 3X/week    Precautions / Restrictions     Pertinent Vitals/Pain Denies Pain.  O2 sats without O2 86-93%, recovers with pursed lip breathing. Demonstrates independence at end of session.       Mobility  Bed Mobility Bed Mobility: Supine to Sit Supine to Sit: 4: Min assist;HOB flat Transfers Transfers: Sit to Stand;Stand to Sit Sit to Stand: 4: Min guard;From bed Stand to Sit: 4: Min guard;To chair/3-in-1 Details for Transfer Assistance: cueing for use of RW Ambulation/Gait Ambulation/Gait Assistance: 5: Supervision Ambulation Distance (Feet): 8 Feet Assistive device: Rolling walker Gait Pattern: Within Functional Limits Gait velocity: decreased due to NG tube Stairs: No    Exercises Total Joint Exercises Ankle Circles/Pumps: Both;10 reps Long Arc Quad: AROM;Both;20 reps;Seated General Exercises - Lower Extremity Toe Raises: AROM;Both;20 reps;Seated Heel Raises:  AROM;Both;20 reps   PT Diagnosis: Difficulty walking  PT Problem List: Decreased activity tolerance;Decreased mobility PT Treatment Interventions: Gait training;Stair training;Functional mobility training;Therapeutic activities;Therapeutic exercise;Patient/family education   PT Goals Acute Rehab PT Goals PT Goal Formulation: With patient Time For Goal Achievement: 04/30/13 Pt will go Sit to Supine/Side: with modified independence;with HOB 0 degrees;with rail PT Goal: Sit to Supine/Side - Progress: Goal set today Pt will Ambulate: >150 feet;with supervision;with least restrictive assistive device PT Goal: Ambulate - Progress: Goal set today Pt will Go Up / Down Stairs: 3-5 stairs;with supervision;with least restrictive assistive device PT Goal: Up/Down Stairs - Progress: Goal set today Pt will Perform Home Exercise Program: Independently PT Goal: Perform Home Exercise Program - Progress: Goal set today  Visit Information  Last PT Received On: 04/23/13    Subjective Data  Subjective: I think I am ok to go home. Patient Stated Goal: wishes to go home.    Prior Functioning  Home Living Lives With: Spouse Available Help at Discharge: Family Type of Home: House Home Access: Stairs to enter [employer] of Steps: 5 Entrance Stairs-Rails: Right;Left Home Layout: One level Bathroom Shower/Tub: Walk-in shower;Tub/shower unit Bathroom Toilet: Standard Home Adaptive Equipment: None Prior Function Level of Independence: Independent Able to Take Stairs?: Yes Driving: Yes Vocation: Retired Musician: No difficulties    Cognition       Extremity/Trunk Assessment Right Lower Extremity Assessment RLE ROM/Strength/Tone: Within functional levels Left Lower Extremity Assessment LLE ROM/Strength/Tone: Within functional levels   Balance    End of Session PT - End of  Session Equipment Utilized During Treatment: Gait belt Patient left: in chair;with  family/visitor present;with call bell/phone within reach Nurse Communication: Mobility status  GP     Sharina Petre, PT 04/23/2013, 12:51 PM

## 2013-04-23 NOTE — Progress Notes (Addendum)
SUBJECTIVE:Nausea and vomiting yesterday and found to have SBO.  No complaints of pain, breathing status some better.  GI symptoms resolved    Acute exacerbation of chronic obstructive pulmonary disease (COPD)   Aortic valve disorders   Atrial flutter   Hypertension   Renal insufficiency   Diabetes  LABS: Basic Metabolic Panel:  Recent Labs  16/10/96 0330  NA 142  K 5.3*  CL 99  CO2 33*  GLUCOSE 232*  BUN 77*  CREATININE 2.01*  CALCIUM 9.3   RADIOLOGY: Dg Abd  04/22/2013   Abnormal gas pattern compatible with early high-grade versus partial small bowel obstruction.    PHYSICAL EXAM BP 117/58  Pulse 121  Temp(Src) 97.7 F (36.5 C) (Axillary)  Resp 18  Ht 5\' 6"  (1.676 m)  Wt 181 lb 3.5 oz (82.2 kg)  BMI 29.26 kg/m2  SpO2 88%  Blood pressure no lower than 100 systolic with a mean systolic of approximately 120-125 mmHg General: Well developed, well nourished, in no acute distress Head: Eyes PERRLA, No xanthomas.   Normal cephalic and atramatic  Lungs: Few expiratory wheezes-improved. No crackles or rhonchi; Prolonged expiratory Heart: HRIR  Tachycardic.S1 S2, No MRG.  Pulses are 2+ & equal. No carotid bruit. Mild JVD.  No abdominal bruits. No femoral bruits. Abdomen: Bowel sounds are minimal, abdomen soft and non-tender without masses; NG tube to low suction. Msk:  Back normal Normal strength and tone for age. Extremities: No clubbing, cyanosis or edema.  DP +1 Neuro: Alert and oriented X 3. Psych:  Good affect, responds appropriately  TELEMETRY: Flutter with variable AV block; ventricular rate of 90-110.  ASSESSMENT AND PLAN: 1. Atrial flutter with RVR: In the setting of COPD exacerbation. Now back on Cardizem drip at 10 mg  in the setting of n.p.o. status with newly diagnosed small bowel obstruction. PO digoxin is  on hold after loading. We will restart IV 0.25 mg daily as heart rate is not optimally controlled. Beta blocker is not recommended with his significant  COPD. CHADS2 score is 2.   2. S/P bioprosthetic AVR with MAZE procedure: Completed in August of 2011, after cardiac cath demonstrating normal coronaries.   3. COPD Exacerbation: Improving but not at optimal breathing status. Continues on Xopenex, steriods and antibiotics per Dr. [name] which may be contributing to elevated HR.   4. Hypertension: Blood pressure is low normal, labile in the low 90 to 118 systolic.  5. Newly diagnosed SBO: By mouth meds are on hold, had transitioned to IV medications temporarily until able to take by mouth again.  Bettey Mare. Lyman Bishop NP [facility] 04/23/2013, 10:34 AM  Cardiology Attending Patient discussed with Joni Reining, NP.  Above note annotated and modified based upon my findings.  Atrial flutter continues, but does not represent a major additional medical problem.  Heart rate control slightly suboptimal-dose of diltiazem will be increased. Anticoagulation is no longer feasible with rivaroxaban.  Lovenox will be started.  Starke Bing, MD 04/23/2013, 11:10 AM

## 2013-04-23 NOTE — Progress Notes (Signed)
Inpatient Diabetes Program Recommendations  AACE/ADA: New Consensus Statement on Inpatient Glycemic Control (2013)  Target Ranges:  Prepandial:   less than 140 mg/dL      Peak postprandial:   less than 180 mg/dL (1-2 hours)      Critically ill patients:  140 - 180 mg/dL    Results for RMANI, KELLOGG (MRN [id number]) as of 04/23/2013 10:09  Ref. Range 04/22/2013 07:49 04/22/2013 11:27 04/22/2013 16:23 04/22/2013 21:41 04/23/2013 07:31  Glucose-Capillary Latest Range: 70-99 mg/dL 147 (H) 829 (H) 562 (H) 178 (H) 226 (H)   Inpatient Diabetes Program Recommendations Insulin - Basal: Please consider increasing Lantus to 20 units daily.  Note: Blood glucose ranged from 178-226 mg/dl over the past 24 hours.  Fasting blood glucose 226 mg/dl this morning.  Note that patient is NPO and has a surgery consult ordered for today due to small bowel obstruction.  Please consider increasing Lantus to 20 units daily.  Will continue to follow.  Thanks, Orlando Penner, RN, BSN, CCRN Diabetes Coordinator Inpatient Diabetes Program [phone]

## 2013-04-23 NOTE — Progress Notes (Signed)
UR chart review completed.  

## 2013-04-24 DIAGNOSIS — I5032 Chronic diastolic (congestive) heart failure: Secondary | ICD-10-CM | POA: Diagnosis not present

## 2013-04-24 DIAGNOSIS — J9620 Acute and chronic respiratory failure, unspecified whether with hypoxia or hypercapnia: Secondary | ICD-10-CM | POA: Diagnosis present

## 2013-04-24 DIAGNOSIS — I10 Essential (primary) hypertension: Secondary | ICD-10-CM

## 2013-04-24 LAB — BASIC METABOLIC PANEL
BUN: 73 mg/dL — ABNORMAL HIGH (ref 6–23)
Calcium: 9.2 mg/dL (ref 8.4–10.5)
Chloride: 98 mEq/L (ref 96–112)
Creatinine, Ser: 1.7 mg/dL — ABNORMAL HIGH (ref 0.50–1.35)
GFR calc Af Amer: 44 mL/min — ABNORMAL LOW (ref >90)

## 2013-04-24 LAB — CBC WITH DIFFERENTIAL/PLATELET
Basophils Absolute: 0 10*3/uL (ref 0.0–0.1)
Basophils Relative: 0 % (ref 0–1)
Eosinophils Relative: 0 % (ref 0–5)
HCT: 36.2 % — ABNORMAL LOW (ref 39.0–52.0)
Lymphocytes Relative: 8 % — ABNORMAL LOW (ref 12–46)
MCHC: 32.3 g/dL (ref 30.0–36.0)
MCV: 94.5 fL (ref 78.0–100.0)
Monocytes Absolute: 0.2 10*3/uL (ref 0.1–1.0)
Neutro Abs: 12.5 10*3/uL — ABNORMAL HIGH (ref 1.7–7.7)
Platelets: 147 10*3/uL — ABNORMAL LOW (ref 150–400)
RDW: 16 % — ABNORMAL HIGH (ref 11.5–15.5)
WBC: 13.8 10*3/uL — ABNORMAL HIGH (ref 4.0–10.5)

## 2013-04-24 LAB — GLUCOSE, CAPILLARY
Glucose-Capillary: 180 mg/dL — ABNORMAL HIGH (ref 70–99)
Glucose-Capillary: 184 mg/dL — ABNORMAL HIGH (ref 70–99)
Glucose-Capillary: 186 mg/dL — ABNORMAL HIGH (ref 70–99)
Glucose-Capillary: 210 mg/dL — ABNORMAL HIGH (ref 70–99)
Glucose-Capillary: 231 mg/dL — ABNORMAL HIGH (ref 70–99)

## 2013-04-24 MED ORDER — DIGOXIN 0.25 MG/ML IJ SOLN
0.1250 mg | Freq: Every day | INTRAMUSCULAR | Status: DC
  Administered 2013-04-25: 0.125 mg via INTRAVENOUS
  Administered 2013-04-26: 0.5 mg via INTRAVENOUS
  Filled 2013-04-24 (×2): qty 2

## 2013-04-24 NOTE — Progress Notes (Signed)
NGT clamped to assist patient to [facility].  Flatus and voided only,  Leg lifts done and patient was steady.  Has been up in recliner since 2000.  HR 94-107 during time on comode.  Monitoring closely

## 2013-04-24 NOTE — Progress Notes (Signed)
Positive for 1 round formed marble sized stool.

## 2013-04-24 NOTE — Progress Notes (Signed)
Inpatient Diabetes Program Recommendations  AACE/ADA: New Consensus Statement on Inpatient Glycemic Control (2013)  Target Ranges:  Prepandial:   less than 140 mg/dL      Peak postprandial:   less than 180 mg/dL (1-2 hours)      Critically ill patients:  140 - 180 mg/dL   Results for ORIEL, RUMBOLD (MRN [id number]) as of 04/24/2013 13:41  Ref. Range 04/23/2013 07:31 04/23/2013 11:20 04/23/2013 16:23 04/24/2013 00:10 04/24/2013 05:28 04/24/2013 11:38  Glucose-Capillary Latest Range: 70-99 mg/dL 409 (H) 811 (H) 914 (H) 180 (H) 184 (H) 231 (H)    Inpatient Diabetes Program Recommendations Insulin - Basal: Please consider increasing Lantus to 20 units daily.  Note: Blood glucose ranged from 180-231 mg/dl over the past 24 hours. Fasting blood glucose 184 mg/dl this morning. Note that patient is NPO and has a small bowel obstruction. Please consider increasing Lantus to 20 units daily. Will continue to follow.  Thanks,  Orlando Penner, RN, BSN, CCRN  Diabetes Coordinator  Inpatient Diabetes Program  [phone]

## 2013-04-24 NOTE — Progress Notes (Signed)
Subjective: No complaints of abdominal pain. No nausea. States he is passing more flatus. No bowel movement.  Objective: Vital signs in last 24 hours: Temp:  [97.8 F (36.6 C)-99.1 F (37.3 C)] 97.8 F (36.6 C) (04/25 0730) Pulse Rate:  [39-133] 61 (04/25 1100) Resp:  [12-22] 15 (04/25 1100) BP: (101-155)/(51-86) 126/60 mmHg (04/25 1100) SpO2:  [85 %-100 %] 96 % (04/25 1143) Weight:  [81.5 kg (179 lb 10.8 oz)] 81.5 kg (179 lb 10.8 oz) (04/25 0500) Last BM Date: 04/20/13  Intake/Output from previous day: 04/24 0701 - 04/25 0700 In: 1236 [P.O.:946; I.V.:240; IV Piggyback:50] Out: 2825 [Urine:1250; Emesis/NG output:1575] Intake/Output this shift: Total I/O In: 40 [I.V.:40] Out: 100 [Urine:100]  General appearance: alert and no distress GI: Quiet, soft, nontender, minimal distention. Umbilical hernia still easily reducible with no evidence of incarceration.  Lab Results:   Recent Labs  04/24/13 0815  WBC 13.8*  HGB 11.7*  HCT 36.2*  PLT 147*   BMET  Recent Labs  04/23/13 0330 04/24/13 0815  NA 142 142  K 5.3* 4.5  CL 99 98  CO2 33* 37*  GLUCOSE 232* 236*  BUN 77* 73*  CREATININE 2.01* 1.70*  CALCIUM 9.3 9.2   PT/INR No results found for this basename: LABPROT, INR,  in the last 72 hours ABG No results found for this basename: PHART, PCO2, PO2, HCO3,  in the last 72 hours  Studies/Results: Ct Abdomen Pelvis Wo Contrast  04/23/2013  *RADIOLOGY REPORT*  Clinical Data: Small bowel obstruction  CT ABDOMEN AND PELVIS WITHOUT CONTRAST  Technique:  Multidetector CT imaging of the abdomen and pelvis was performed following the standard protocol without intravenous contrast. Oral contrast was given.  Comparison: September 22, 2010.  Findings: Visualized lung bases appear normal.  Status post cholecystectomy.  The liver, spleen and pancreas appear normal. Bilateral renal cysts are noted which are unchanged compared to prior exam.  Adrenal glands are normal.  No  hydronephrosis or renal obstruction is noted.  No renal or ureteral calculi are noted. Appendix appears normal.  Sigmoid diverticulosis is noted without inflammation.  Urinary bladder appears normal.  Mild prostatic hypertrophy is noted.  Moderate proximal small bowel dilatation is noted with transition zone seen on image number 45 of series 2. The distal small bowel appears normal in caliber.  No abnormal fluid collection is noted. Small fat containing periumbilical hernia is noted which is stable compared to prior exam.  IMPRESSION: Sigmoid diverticulosis is noted with inflammation.  Old bilateral renal cysts are noted.  Proximal small bowel dilatation is noted with transition zone seen in the central portion of the abdomen. This is concerning for a partial small bowel obstruction potentially due to adhesion.   Original Report Authenticated By: Lupita Raider.,  M.D.    Dg Abd 1 View  04/22/2013  *RADIOLOGY REPORT*  Clinical Data: 77 year old male with abdominal pain and swelling.  ABDOMEN - 1 VIEW  Comparison: 12/12/2011 and earlier.  Findings: Portable supine views of the abdomen and pelvis.  There are gas filled small bowel loops measuring up to 53 mm diameter. Some colonic gas is present.  Grossly stable lung bases.  No definite pneumoperitoneum. No acute osseous abnormality identified.  IMPRESSION: Abnormal gas pattern compatible with early high-grade versus partial small bowel obstruction.   Original Report Authenticated By: Erskine Speed, M.D.     Anti-infectives: Anti-infectives   Start     Dose/Rate Route Frequency Ordered Stop   04/22/13 1230  azithromycin (ZITHROMAX) tablet  500 mg  Status:  Discontinued     500 mg Oral Daily 04/22/13 1217 04/23/13 0752   04/19/13 1500  azithromycin (ZITHROMAX) 500 mg in dextrose 5 % 250 mL IVPB  Status:  Discontinued     500 mg 250 mL/hr over 60 Minutes Intravenous Every 24 hours 04/19/13 1317 04/22/13 1217   04/19/13 1400  cefTRIAXone (ROCEPHIN) 1 g in  dextrose 5 % 50 mL IVPB     1 g 100 mL/hr over 30 Minutes Intravenous Every 24 hours 04/19/13 1317     04/19/13 1230  cefTRIAXone (ROCEPHIN) 1 g in dextrose 5 % 50 mL IVPB  Status:  Discontinued     1 g 100 mL/hr over 30 Minutes Intravenous Every 24 hours 04/19/13 1224 04/19/13 1317   04/19/13 1230  azithromycin (ZITHROMAX) 500 mg in dextrose 5 % 250 mL IVPB  Status:  Discontinued     500 mg 250 mL/hr over 60 Minutes Intravenous Every 24 hours 04/19/13 1224 04/19/13 1317      Assessment/Plan: s/p * No surgery found * Small bowel obstruction. Patient does demonstrate increasing flatus. This is encouraging. Continue nasogastric decompression at this time. Continue bowel rest. Continue IV fluid hydration. We'll continue to follow the patient's course of the weekend. Should patient fail to progress through the weekend I discussed with the patient possibly re\re entertain surgical intervention. However this time hopefully patient will continue to show good progress and resolved continue conservative management.  LOS: 5 days    Neftaly Inzunza C 04/24/2013

## 2013-04-24 NOTE — Progress Notes (Addendum)
SUBJECTIVE: No complaints of chest pain or abdominal pain.  Principal Problem:   Acute exacerbation of chronic obstructive pulmonary disease (COPD) Active Problems:   Aortic valve disorders   Atrial flutter   COPD (chronic obstructive pulmonary disease)   Hypertension   Renal insufficiency   Diabetes   Chronic diastolic congestive heart failure   Acute-on-chronic respiratory failure  LABS: Basic Metabolic Panel:  Recent Labs  16/10/96 0330  NA 142  K 5.3*  CL 99  CO2 33*  GLUCOSE 232*  BUN 77*  CREATININE 2.01*  CALCIUM 9.3   PHYSICAL EXAM BP 155/82  Pulse 41  Temp(Src) 97.8 F (36.6 C) (Axillary)  Resp 18  Ht 5\' 6"  (1.676 m)  Wt 179 lb 10.8 oz (81.5 kg)  BMI 29.01 kg/m2  SpO2 98% General: Well developed, well nourished, in no acute distress Head: Eyes PERRLA, No xanthomas.   Normal cephalic and atramatic  Lungs: Clear bilaterally to auscultation and percussion. Heart: HRRR S1 S2, No MRG .  Pulses are 2+ & equal.            No carotid bruit. No JVD.  No abdominal bruits. No femoral bruits. Abdomen: Bowel sounds are positive, abdomen soft and non-tender without masses or                  Hernia's noted. Msk:  Back normal, normal gait. Normal strength and tone for age. Extremities: No clubbing, cyanosis or edema.  DP +1 Neuro: Alert and oriented X 3. Psych:  Good affect, responds appropriately  TELEMETRY: Reviewed telemetry pt in Atrial flutter with variable AV block in the 80's.  ASSESSMENT AND PLAN:  1. Atrial flutter with RVR: In the setting of COPD exacerbation. CHADS Score 2. Diltiazem drip was increased to 15 mg an hour, digoxin has been started IV at 0.25 mg daily until by mouth intake is resumed. Lovenox is now being utilized as he is unable to take Xarelto. Flecainide failed to convert.  2. S/P bioprosthetic AVR with MAZE procedure: Completed in August of 2011, after cardiac cath demonstrating normal coronaries.   3. COPD Exacerbation: Improving but  not at optimal breathing status. Continues on Xopenex, steriods and antibiotics per Dr. [name] which may be contributing to elevated HR.   4. Hypertension: Blood pressure is low normal, labile in the low 90 to 118 systolic.   Bettey Mare. Lyman Bishop NP [facility] 04/24/2013, 8:36 AM  Cardiology Attending Patient interviewed and examined. Discussed with Joni Reining, NP.  Above note annotated and modified based upon my findings.  Heart rate is now well controlled, but atrial flutter persists. Bowel sounds are present suggesting that ileus is resolving. Respiratory status improved. Current medications can be continued until he is able to the oral route is again available. Then diltiazem can be changed to 120 mg 3 times a day.  Continue anticoagulation; DC cardioversion anticipated in 3 weeks.  Renal function has deteriorated since admission, but improved over the past 24 hours. Dose of digoxin will be decreased.  [place] Bing, MD 04/24/2013, 6:58 PM

## 2013-04-24 NOTE — Progress Notes (Signed)
Subjective: He feels much better. He is passing flatus. His breathing is better. He remains in atrial fibrillation but his heart rate is better controlled  Objective: Vital signs in last 24 hours: Temp:  [97.7 F (36.5 C)-99.1 F (37.3 C)] 97.8 F (36.6 C) (04/25 0400) Pulse Rate:  [39-133] 59 (04/25 0600) Resp:  [12-22] 15 (04/25 0600) BP: (101-149)/(51-86) 135/69 mmHg (04/25 0600) SpO2:  [89 %-100 %] 97 % (04/25 0724) Weight:  [81.5 kg (179 lb 10.8 oz)] 81.5 kg (179 lb 10.8 oz) (04/25 0500) Weight change: -0.7 kg (-1 lb 8.7 oz) Last BM Date: 04/20/13  Intake/Output from previous day: 04/24 0701 - 04/25 0700 In: 1166 [P.O.:946; I.V.:170; IV Piggyback:50] Out: 2825 [Urine:1250; Emesis/NG output:1575]  PHYSICAL EXAM General appearance: alert, cooperative and mild distress Resp: clear to auscultation bilaterally Cardio: irregularly irregular rhythm GI: soft, non-tender; bowel sounds normal; no masses,  no organomegaly Extremities: extremities normal, atraumatic, no cyanosis or edema  Lab Results:    Basic Metabolic Panel:  Recent Labs  16/10/96 0330  NA 142  K 5.3*  CL 99  CO2 33*  GLUCOSE 232*  BUN 77*  CREATININE 2.01*  CALCIUM 9.3   Liver Function Tests: No results found for this basename: AST, ALT, ALKPHOS, BILITOT, PROT, ALBUMIN,  in the last 72 hours No results found for this basename: LIPASE, AMYLASE,  in the last 72 hours No results found for this basename: AMMONIA,  in the last 72 hours CBC: No results found for this basename: WBC, NEUTROABS, HGB, HCT, MCV, PLT,  in the last 72 hours Cardiac Enzymes: No results found for this basename: CKTOTAL, CKMB, CKMBINDEX, TROPONINI,  in the last 72 hours BNP: No results found for this basename: PROBNP,  in the last 72 hours D-Dimer: No results found for this basename: DDIMER,  in the last 72 hours CBG:  Recent Labs  04/22/13 2141 04/23/13 0731 04/23/13 1120 04/23/13 1623 04/24/13 0010 04/24/13 0528   GLUCAP 178* 226* 181* 199* 180* 184*   Hemoglobin A1C: No results found for this basename: HGBA1C,  in the last 72 hours Fasting Lipid Panel: No results found for this basename: CHOL, HDL, LDLCALC, TRIG, CHOLHDL, LDLDIRECT,  in the last 72 hours Thyroid Function Tests: No results found for this basename: TSH, T4TOTAL, FREET4, T3FREE, THYROIDAB,  in the last 72 hours Anemia Panel: No results found for this basename: VITAMINB12, FOLATE, FERRITIN, TIBC, IRON, RETICCTPCT,  in the last 72 hours Coagulation: No results found for this basename: LABPROT, INR,  in the last 72 hours Urine Drug Screen: Drugs of Abuse  No results found for this basename: labopia, cocainscrnur, labbenz, amphetmu, thcu, labbarb    Alcohol Level: No results found for this basename: ETH,  in the last 72 hours Urinalysis: No results found for this basename: COLORURINE, APPERANCEUR, LABSPEC, PHURINE, GLUCOSEU, HGBUR, BILIRUBINUR, KETONESUR, PROTEINUR, UROBILINOGEN, NITRITE, LEUKOCYTESUR,  in the last 72 hours Misc. Labs:  ABGS No results found for this basename: PHART, PCO2, PO2ART, TCO2, HCO3,  in the last 72 hours CULTURES Recent Results (from the past 240 hour(s))  MRSA PCR SCREENING     Status: None   Collection Time    04/19/13  3:08 PM      Result Value Range Status   MRSA by PCR NEGATIVE  NEGATIVE Final   Comment:            The GeneXpert MRSA Assay (FDA     approved for NASAL specimens     only), is one  component of a     comprehensive MRSA colonization     surveillance program. It is not     intended to diagnose MRSA     infection nor to guide or     monitor treatment for     MRSA infections.   Studies/Results: Ct Abdomen Pelvis Wo Contrast  04/23/2013  *RADIOLOGY REPORT*  Clinical Data: Small bowel obstruction  CT ABDOMEN AND PELVIS WITHOUT CONTRAST  Technique:  Multidetector CT imaging of the abdomen and pelvis was performed following the standard protocol without intravenous contrast. Oral  contrast was given.  Comparison: September 22, 2010.  Findings: Visualized lung bases appear normal.  Status post cholecystectomy.  The liver, spleen and pancreas appear normal. Bilateral renal cysts are noted which are unchanged compared to prior exam.  Adrenal glands are normal.  No hydronephrosis or renal obstruction is noted.  No renal or ureteral calculi are noted. Appendix appears normal.  Sigmoid diverticulosis is noted without inflammation.  Urinary bladder appears normal.  Mild prostatic hypertrophy is noted.  Moderate proximal small bowel dilatation is noted with transition zone seen on image number 45 of series 2. The distal small bowel appears normal in caliber.  No abnormal fluid collection is noted. Small fat containing periumbilical hernia is noted which is stable compared to prior exam.  IMPRESSION: Sigmoid diverticulosis is noted with inflammation.  Old bilateral renal cysts are noted.  Proximal small bowel dilatation is noted with transition zone seen in the central portion of the abdomen. This is concerning for a partial small bowel obstruction potentially due to adhesion.   Original Report Authenticated By: Lupita Raider.,  M.D.    Dg Abd 1 View  04/22/2013  *RADIOLOGY REPORT*  Clinical Data: 77 year old male with abdominal pain and swelling.  ABDOMEN - 1 VIEW  Comparison: 12/12/2011 and earlier.  Findings: Portable supine views of the abdomen and pelvis.  There are gas filled small bowel loops measuring up to 53 mm diameter. Some colonic gas is present.  Grossly stable lung bases.  No definite pneumoperitoneum. No acute osseous abnormality identified.  IMPRESSION: Abnormal gas pattern compatible with early high-grade versus partial small bowel obstruction.   Original Report Authenticated By: Erskine Speed, M.D.     Medications:  Prior to Admission:  Prescriptions prior to admission  Medication Sig Dispense Refill  . acetaminophen (TYLENOL) 500 MG tablet Take 1,000 mg by mouth every 6  (six) hours as needed. Pain      . allopurinol (ZYLOPRIM) 300 MG tablet Take 300 mg by mouth daily.       . Ascorbic Acid (VITAMIN C) 1000 MG tablet Take 1,000 mg by mouth daily.      Marland Kitchen aspirin 81 MG tablet Take 81 mg by mouth daily.        Marland Kitchen diltiazem (DILACOR XR) 240 MG 24 hr capsule Take 240 mg by mouth daily.       . fish oil-omega-3 fatty acids 1000 MG capsule Take 1 g by mouth daily.      . fluticasone (FLOVENT HFA) 110 MCG/ACT inhaler Inhale 1 puff into the lungs 2 (two) times daily.      . furosemide (LASIX) 40 MG tablet Take 40 mg by mouth daily.        Marland Kitchen glyBURIDE (DIABETA) 2.5 MG tablet Take 2.5 mg by mouth daily.        Marland Kitchen ipratropium-albuterol (DUONEB) 0.5-2.5 (3) MG/3ML SOLN Take 3 mLs by nebulization 4 (four) times daily.      Marland Kitchen  lisinopril (PRINIVIL,ZESTRIL) 10 MG tablet Take 10 mg by mouth daily.       Marland Kitchen neomycin-polymyxin-hydrocortisone (CORTISPORIN) otic solution Place 3 drops into both ears daily as needed. Earaches      . potassium chloride SA (K-DUR,KLOR-CON) 20 MEQ tablet Take 20 mEq by mouth daily.         Scheduled: . antiseptic oral rinse  15 mL Mouth Rinse BID  . cefTRIAXone (ROCEPHIN) IVPB 1 gram/50 mL D5W  1 g Intravenous Q24H  . digoxin  0.25 mg Intravenous Daily  . enoxaparin (LOVENOX) injection  80 mg Subcutaneous Q24H  . insulin aspart  0-20 Units Subcutaneous Q6H  . insulin glargine  15 Units Subcutaneous Daily  . ipratropium  0.5 mg Nebulization Q4H while awake  . levalbuterol  1.25 mg Nebulization Q4H while awake  . methylPREDNISolone (SOLU-MEDROL) injection  40 mg Intravenous Q6H  . vitamin C  1,000 mg Oral Daily   Continuous: . diltiazem (CARDIZEM) infusion 10 mg/hr (04/24/13 0250)   WUJ:[id number]  Assesment: He was admitted with an acute exacerbation of COPD. He developed atrial flutter/fibrillation with rapid ventricular response. He now has small bowel obstruction. His renal function is worse and he does have some renal insufficiency which will  be retested Principal Problem:   Acute exacerbation of chronic obstructive pulmonary disease (COPD) Active Problems:   Aortic valve disorders   Atrial flutter   COPD (chronic obstructive pulmonary disease)   Hypertension   Renal insufficiency   Diabetes    Plan: Continue current treatments. I will leave the decision regarding NG drainage to his surgeon.    LOS: 5 days   Tunya Held L 04/24/2013, 7:49 AM

## 2013-04-24 NOTE — Consult Note (Signed)
Reason for Consult: Small Bowel Obstruction  Referring Physician: Dr. Dede Query [name] [name] is an 77 y.o. male.  HPI: Patient presented to [facility] with shortness of breath and chest pain. Patient was found to be in atrial fibrillation was admitted for continued management of his chronic cardiac and pulmonary issues. Over the last 2 days patient has not had a bowel movement. He has had progressive increasing nausea and vomiting with increasing diffuse abdominal pain. Pain is describe as colicky in nature. He did have similar symptoms in the past which was described as a small bowel obstruction to him which resolved with conservative management. Initially patient denied any abdominal surgeries but admits to having a cholecystectomy in the past.  He has not had a colonoscopy. His last bowel movement was small but reported as normal. No melena or hematochezia. No hematemesis. Since placement of the nasogastric tube the patient's nausea has improved significantly and currently he feels much better. He does state he is passing some flatus but no bowel movement. He also comments that he is not passing very much flatus.  Past Medical History  Diagnosis Date  . COPD (chronic obstructive pulmonary disease)   . AF (atrial fibrillation)   . Hypertension   . Gout   . Arthritis   . Pneumonia     history of pneumonia  . Small bowel obstruction   . Renal insufficiency   . Aortic stenosis   . Pneumonia 10/12  . Hydropneumothorax   . Diabetes mellitus   . CHF (congestive heart failure)   . On home O2     qhs prn    Past Surgical History  Procedure Laterality Date  . Rotator cuff repair      left and right  . Cardiac valve replacement      aortic valve with a tissue prosthesis and left sided maze proc  . S/p rewiring of sternum for dehiscence    . Gallbladder surgery    . US echocardiography  01-03-11    EF 55-60%  . Cardiovascular stress test  01-26-09    EF 67%  . Coronary  angioplasty    . Cholecystectomy    . Sternal incision reclosure  08/29/2010    sternal rewiring  . Cataract extraction w/phaco  10/02/2012    Procedure: CATARACT EXTRACTION PHACO AND INTRAOCULAR LENS PLACEMENT (IOC);  Surgeon: Gemma Payor, MD;  Location: [facility];  Service: Ophthalmology;  Laterality: Right;  CDE 16.68  . Cataract extraction w/phaco  10/27/2012    Procedure: CATARACT EXTRACTION PHACO AND INTRAOCULAR LENS PLACEMENT (IOC);  Surgeon: Gemma Payor, MD;  Location: [facility];  Service: Ophthalmology;  Laterality: Left;  CDE:16.89  . Artificial aortic valve      Family History  Problem Relation Age of Onset  . Hypertension Mother   . Stroke Mother   . Heart disease Sister   . Kidney disease Sister     Social History:  reports that he quit smoking about 8 years ago. His smoking use included Cigarettes. He smoked 0.00 packs per day. He does not have any smokeless tobacco history on file. He reports that he does not drink alcohol or use illicit drugs.  Allergies: No Known Allergies  Medications:  I have reviewed the patient's current medications. Prior to Admission:  Prescriptions prior to admission  Medication Sig Dispense Refill  . acetaminophen (TYLENOL) 500 MG tablet Take 1,000 mg by mouth every 6 (six) hours as needed. Pain      .  allopurinol (ZYLOPRIM) 300 MG tablet Take 300 mg by mouth daily.       . Ascorbic Acid (VITAMIN C) 1000 MG tablet Take 1,000 mg by mouth daily.      Marland Kitchen aspirin 81 MG tablet Take 81 mg by mouth daily.        Marland Kitchen diltiazem (DILACOR XR) 240 MG 24 hr capsule Take 240 mg by mouth daily.       . fish oil-omega-3 fatty acids 1000 MG capsule Take 1 g by mouth daily.      . fluticasone (FLOVENT HFA) 110 MCG/ACT inhaler Inhale 1 puff into the lungs 2 (two) times daily.      . furosemide (LASIX) 40 MG tablet Take 40 mg by mouth daily.        Marland Kitchen glyBURIDE (DIABETA) 2.5 MG tablet Take 2.5 mg by mouth daily.        Marland Kitchen ipratropium-albuterol (DUONEB) 0.5-2.5 (3) MG/3ML  SOLN Take 3 mLs by nebulization 4 (four) times daily.      Marland Kitchen lisinopril (PRINIVIL,ZESTRIL) 10 MG tablet Take 10 mg by mouth daily.       Marland Kitchen neomycin-polymyxin-hydrocortisone (CORTISPORIN) otic solution Place 3 drops into both ears daily as needed. Earaches      . potassium chloride SA (K-DUR,KLOR-CON) 20 MEQ tablet Take 20 mEq by mouth daily.         Scheduled: . antiseptic oral rinse  15 mL Mouth Rinse BID  . cefTRIAXone (ROCEPHIN) IVPB 1 gram/50 mL D5W  1 g Intravenous Q24H  . digoxin  0.25 mg Intravenous Daily  . enoxaparin (LOVENOX) injection  80 mg Subcutaneous Q24H  . insulin aspart  0-20 Units Subcutaneous Q6H  . insulin glargine  15 Units Subcutaneous Daily  . ipratropium  0.5 mg Nebulization Q4H while awake  . levalbuterol  1.25 mg Nebulization Q4H while awake  . methylPREDNISolone (SOLU-MEDROL) injection  40 mg Intravenous Q6H  . vitamin C  1,000 mg Oral Daily   Continuous: . diltiazem (CARDIZEM) infusion 10 mg/hr (04/24/13 0800)   ZOX:[id number] Anti-infectives   Start     Dose/Rate Route Frequency Ordered Stop   04/22/13 1230  azithromycin (ZITHROMAX) tablet 500 mg  Status:  Discontinued     500 mg Oral Daily 04/22/13 1217 04/23/13 0752   04/19/13 1500  azithromycin (ZITHROMAX) 500 mg in dextrose 5 % 250 mL IVPB  Status:  Discontinued     500 mg 250 mL/hr over 60 Minutes Intravenous Every 24 hours 04/19/13 1317 04/22/13 1217   04/19/13 1400  cefTRIAXone (ROCEPHIN) 1 g in dextrose 5 % 50 mL IVPB     1 g 100 mL/hr over 30 Minutes Intravenous Every 24 hours 04/19/13 1317     04/19/13 1230  cefTRIAXone (ROCEPHIN) 1 g in dextrose 5 % 50 mL IVPB  Status:  Discontinued     1 g 100 mL/hr over 30 Minutes Intravenous Every 24 hours 04/19/13 1224 04/19/13 1317   04/19/13 1230  azithromycin (ZITHROMAX) 500 mg in dextrose 5 % 250 mL IVPB  Status:  Discontinued     500 mg 250 mL/hr over 60 Minutes Intravenous Every 24 hours 04/19/13 1224 04/19/13 1317      Results for orders  placed during the hospital encounter of 04/19/13 (from the past 48 hour(s))  GLUCOSE, CAPILLARY     Status: Abnormal   Collection Time    04/22/13 11:27 AM      Result Value Range   Glucose-Capillary 187 (*) 70 - 99 mg/dL  GLUCOSE,  CAPILLARY     Status: Abnormal   Collection Time    04/22/13  4:23 PM      Result Value Range   Glucose-Capillary 205 (*) 70 - 99 mg/dL   Comment 1 Documented in Chart     Comment 2 Notify RN    GLUCOSE, CAPILLARY     Status: Abnormal   Collection Time    04/22/13  9:41 PM      Result Value Range   Glucose-Capillary 178 (*) 70 - 99 mg/dL   Comment 1 Notify RN    BASIC METABOLIC PANEL     Status: Abnormal   Collection Time    04/23/13  3:30 AM      Result Value Range   Sodium 142  135 - 145 mEq/L   Potassium 5.3 (*) 3.5 - 5.1 mEq/L   Chloride 99  96 - 112 mEq/L   CO2 33 (*) 19 - 32 mEq/L   Glucose, Bld 232 (*) 70 - 99 mg/dL   BUN 77 (*) 6 - 23 mg/dL   Creatinine, Ser 1.61 (*) 0.50 - 1.35 mg/dL   Calcium 9.3  8.4 - 09.6 mg/dL   GFR calc non Af Amer 31 (*) >90 mL/min   GFR calc Af Amer 36 (*) >90 mL/min   Comment:            The eGFR has been calculated     using the CKD EPI equation.     This calculation has not been     validated in all clinical     situations.     eGFR's persistently     <90 mL/min signify     possible Chronic Kidney Disease.  GLUCOSE, CAPILLARY     Status: Abnormal   Collection Time    04/23/13  7:31 AM      Result Value Range   Glucose-Capillary 226 (*) 70 - 99 mg/dL   Comment 1 Documented in Chart     Comment 2 Notify RN    GLUCOSE, CAPILLARY     Status: Abnormal   Collection Time    04/23/13 11:20 AM      Result Value Range   Glucose-Capillary 181 (*) 70 - 99 mg/dL  GLUCOSE, CAPILLARY     Status: Abnormal   Collection Time    04/23/13  4:23 PM      Result Value Range   Glucose-Capillary 199 (*) 70 - 99 mg/dL  GLUCOSE, CAPILLARY     Status: Abnormal   Collection Time    04/24/13 12:10 AM      Result Value  Range   Glucose-Capillary 180 (*) 70 - 99 mg/dL  GLUCOSE, CAPILLARY     Status: Abnormal   Collection Time    04/24/13  5:28 AM      Result Value Range   Glucose-Capillary 184 (*) 70 - 99 mg/dL   Comment 1 Notify RN    BASIC METABOLIC PANEL     Status: Abnormal   Collection Time    04/24/13  8:15 AM      Result Value Range   Sodium 142  135 - 145 mEq/L   Potassium 4.5  3.5 - 5.1 mEq/L   Chloride 98  96 - 112 mEq/L   CO2 37 (*) 19 - 32 mEq/L   Glucose, Bld 236 (*) 70 - 99 mg/dL   BUN 73 (*) 6 - 23 mg/dL   Creatinine, Ser 0.45 (*) 0.50 - 1.35 mg/dL   Calcium 9.2  8.4 - 10.5 mg/dL   GFR calc non Af Amer 38 (*) >90 mL/min   GFR calc Af Amer 44 (*) >90 mL/min   Comment:            The eGFR has been calculated     using the CKD EPI equation.     This calculation has not been     validated in all clinical     situations.     eGFR's persistently     <90 mL/min signify     possible Chronic Kidney Disease.  CBC WITH DIFFERENTIAL     Status: Abnormal   Collection Time    04/24/13  8:15 AM      Result Value Range   WBC 13.8 (*) 4.0 - 10.5 K/uL   RBC 3.83 (*) 4.22 - 5.81 MIL/uL   Hemoglobin 11.7 (*) 13.0 - 17.0 g/dL   HCT 09.8 (*) 11.9 - 14.7 %   MCV 94.5  78.0 - 100.0 fL   MCH 30.5  26.0 - 34.0 pg   MCHC 32.3  30.0 - 36.0 g/dL   RDW 82.9 (*) 56.2 - 13.0 %   Platelets 147 (*) 150 - 400 K/uL   Neutrophils Relative 91 (*) 43 - 77 %   Neutro Abs 12.5 (*) 1.7 - 7.7 K/uL   Lymphocytes Relative 8 (*) 12 - 46 %   Lymphs Abs 1.1  0.7 - 4.0 K/uL   Monocytes Relative 1 (*) 3 - 12 %   Monocytes Absolute 0.2  0.1 - 1.0 K/uL   Eosinophils Relative 0  0 - 5 %   Eosinophils Absolute 0.0  0.0 - 0.7 K/uL   Basophils Relative 0  0 - 1 %   Basophils Absolute 0.0  0.0 - 0.1 K/uL    Ct Abdomen Pelvis Wo Contrast  04/23/2013  *RADIOLOGY REPORT*  Clinical Data: Small bowel obstruction  CT ABDOMEN AND PELVIS WITHOUT CONTRAST  Technique:  Multidetector CT imaging of the abdomen and pelvis was  performed following the standard protocol without intravenous contrast. Oral contrast was given.  Comparison: September 22, 2010.  Findings: Visualized lung bases appear normal.  Status post cholecystectomy.  The liver, spleen and pancreas appear normal. Bilateral renal cysts are noted which are unchanged compared to prior exam.  Adrenal glands are normal.  No hydronephrosis or renal obstruction is noted.  No renal or ureteral calculi are noted. Appendix appears normal.  Sigmoid diverticulosis is noted without inflammation.  Urinary bladder appears normal.  Mild prostatic hypertrophy is noted.  Moderate proximal small bowel dilatation is noted with transition zone seen on image number 45 of series 2. The distal small bowel appears normal in caliber.  No abnormal fluid collection is noted. Small fat containing periumbilical hernia is noted which is stable compared to prior exam.  IMPRESSION: Sigmoid diverticulosis is noted with inflammation.  Old bilateral renal cysts are noted.  Proximal small bowel dilatation is noted with transition zone seen in the central portion of the abdomen. This is concerning for a partial small bowel obstruction potentially due to adhesion.   Original Report Authenticated By: Lupita Raider.,  M.D.    Dg Abd 1 View  04/22/2013  *RADIOLOGY REPORT*  Clinical Data: 77 year old male with abdominal pain and swelling.  ABDOMEN - 1 VIEW  Comparison: 12/12/2011 and earlier.  Findings: Portable supine views of the abdomen and pelvis.  There are gas filled small bowel loops measuring up to 53 mm diameter. Some colonic gas is  present.  Grossly stable lung bases.  No definite pneumoperitoneum. No acute osseous abnormality identified.  IMPRESSION: Abnormal gas pattern compatible with early high-grade versus partial small bowel obstruction.   Original Report Authenticated By: Erskine Speed, M.D.     Review of Systems  Constitutional: Positive for diaphoresis. Negative for fever and chills.  HENT:  Negative.   Eyes: Negative.   Respiratory: Positive for shortness of breath.   Cardiovascular: Positive for chest pain and palpitations.  Gastrointestinal: Positive for nausea, vomiting, abdominal pain (diffuse) and constipation. Negative for blood in stool and melena.  Genitourinary: Negative.   Musculoskeletal: Negative.   Skin: Negative.   Neurological: Negative.   Endo/Heme/Allergies: Negative.   Psychiatric/Behavioral: Negative.    Blood pressure 155/82, pulse 41, temperature 97.8 F (36.6 C), temperature source Oral, resp. rate 18, height 5\' 6"  (1.676 m), weight 81.5 kg (179 lb 10.8 oz), SpO2 98.00%. Physical Exam  Constitutional: He is oriented to person, place, and time. He appears well-developed and well-nourished.  Elderly  HENT:  Head: Normocephalic and atraumatic.  Eyes: Conjunctivae and EOM are normal. Pupils are equal, round, and reactive to light. No scleral icterus.  Neck: Normal range of motion. Neck supple. No tracheal deviation present. No thyromegaly present.  Cardiovascular: Normal rate.   A irregular rhythm  Respiratory: No respiratory distress.  GI: Soft. He exhibits distension. He exhibits no mass. There is tenderness (mild diffuse, no peritoneal signs.). There is no rebound and no guarding.  Quiet, palpable and easily reducible umbilical hernia.  Musculoskeletal: Normal range of motion.  Lymphadenopathy:    He has no cervical adenopathy.  Neurological: He is alert and oriented to person, place, and time.  Skin: Skin is warm and dry.    Assessment/Plan: Small bowel obstruction. Patient's clinical and radiographic findings were discussed with the patient and family. I. Patient does have evidence of small bowel obstruction. Given his surgical history at the more suspicious that this is due to adhesions versus a tumor mass. At this time given the patient's significant cardiac and pulmonary disease would continue to treat him conservatively. Continue nasogastric  decompression with bowel rest. Continue IV fluid hydration. Surgical indications were discussed with the patient and family. At this time we'll continue to follow his progress. Also it was recommended to the patient to undergo colonoscopy for colon surveillance. Obviously this would be considered if and when the patient has resolution of his small bowel obstruction and is by no means of emergent or urgent  Saveah Bahar C 04/24/2013, 11:05 AM

## 2013-04-25 LAB — CBC WITH DIFFERENTIAL/PLATELET
Eosinophils Absolute: 0 10*3/uL (ref 0.0–0.7)
Eosinophils Relative: 0 % (ref 0–5)
HCT: 37.2 % — ABNORMAL LOW (ref 39.0–52.0)
Lymphocytes Relative: 8 % — ABNORMAL LOW (ref 12–46)
Lymphs Abs: 1.1 10*3/uL (ref 0.7–4.0)
MCH: 30.2 pg (ref 26.0–34.0)
MCV: 95.1 fL (ref 78.0–100.0)
Monocytes Absolute: 0.2 10*3/uL (ref 0.1–1.0)
Monocytes Relative: 2 % — ABNORMAL LOW (ref 3–12)
RBC: 3.91 MIL/uL — ABNORMAL LOW (ref 4.22–5.81)
WBC: 12.7 10*3/uL — ABNORMAL HIGH (ref 4.0–10.5)

## 2013-04-25 LAB — GLUCOSE, CAPILLARY
Glucose-Capillary: 173 mg/dL — ABNORMAL HIGH (ref 70–99)
Glucose-Capillary: 198 mg/dL — ABNORMAL HIGH (ref 70–99)
Glucose-Capillary: 202 mg/dL — ABNORMAL HIGH (ref 70–99)

## 2013-04-25 LAB — BASIC METABOLIC PANEL
CO2: 40 mEq/L (ref 19–32)
Calcium: 9.3 mg/dL (ref 8.4–10.5)
Chloride: 101 mEq/L (ref 96–112)
Potassium: 4.2 mEq/L (ref 3.5–5.1)
Sodium: 147 mEq/L — ABNORMAL HIGH (ref 135–145)

## 2013-04-25 MED ORDER — GUAIFENESIN ER 600 MG PO TB12
600.0000 mg | ORAL_TABLET | Freq: Two times a day (BID) | ORAL | Status: DC
  Administered 2013-04-26 – 2013-04-29 (×7): 600 mg via ORAL
  Filled 2013-04-25 (×7): qty 1

## 2013-04-25 MED ORDER — DILTIAZEM HCL 60 MG PO TABS
60.0000 mg | ORAL_TABLET | Freq: Four times a day (QID) | ORAL | Status: DC
  Administered 2013-04-26 – 2013-04-28 (×8): 60 mg via ORAL
  Filled 2013-04-25 (×8): qty 1

## 2013-04-25 NOTE — Progress Notes (Signed)
  Subjective: NO nausea.  Small BM last PM.  out NG over last shift per RN.  Objective: Vital signs in last 24 hours: Temp:  [97.7 F (36.5 C)-98.9 F (37.2 C)] 98.4 F (36.9 C) (04/26 0756) Pulse Rate:  [63-82] 73 (04/26 1100) Resp:  [14-19] 15 (04/26 1100) BP: (105-163)/(48-89) 147/59 mmHg (04/26 1100) SpO2:  [92 %-99 %] 97 % (04/26 1130) Weight:  [81.5 kg (179 lb 10.8 oz)] 81.5 kg (179 lb 10.8 oz) (04/26 0500) Last BM Date: 04/24/13  Intake/Output from previous day: 04/25 0701 - 04/26 0700 In: 290 [I.V.:240; IV Piggyback:50] Out: 1350 [Urine:750; Emesis/NG output:600] Intake/Output this shift: Total I/O In: 10 [I.V.:10] Out: 750 [Urine:200; Emesis/NG output:550]  General appearance: alert and no distress GI: soft, non-tender; bowel sounds normal; no masses,  no organomegaly  Lab Results:   Recent Labs  04/24/13 0815 04/25/13 0447  WBC 13.8* 12.7*  HGB 11.7* 11.8*  HCT 36.2* 37.2*  PLT 147* 143*   BMET  Recent Labs  04/24/13 0815 04/25/13 0447  NA 142 147*  K 4.5 4.2  CL 98 101  CO2 37* 40*  GLUCOSE 236* 185*  BUN 73* 68*  CREATININE 1.70* 1.54*  CALCIUM 9.2 9.3   PT/INR No results found for this basename: LABPROT, INR,  in the last 72 hours ABG No results found for this basename: PHART, PCO2, PO2, HCO3,  in the last 72 hours  Studies/Results: No results found.  Anti-infectives: Anti-infectives   Start     Dose/Rate Route Frequency Ordered Stop   04/22/13 1230  azithromycin (ZITHROMAX) tablet 500 mg  Status:  Discontinued     500 mg Oral Daily 04/22/13 1217 04/23/13 0752   04/19/13 1500  azithromycin (ZITHROMAX) 500 mg in dextrose 5 % 250 mL IVPB  Status:  Discontinued     500 mg 250 mL/hr over 60 Minutes Intravenous Every 24 hours 04/19/13 1317 04/22/13 1217   04/19/13 1400  cefTRIAXone (ROCEPHIN) 1 g in dextrose 5 % 50 mL IVPB     1 g 100 mL/hr over 30 Minutes Intravenous Every 24 hours 04/19/13 1317     04/19/13 1230  cefTRIAXone  (ROCEPHIN) 1 g in dextrose 5 % 50 mL IVPB  Status:  Discontinued     1 g 100 mL/hr over 30 Minutes Intravenous Every 24 hours 04/19/13 1224 04/19/13 1317   04/19/13 1230  azithromycin (ZITHROMAX) 500 mg in dextrose 5 % 250 mL IVPB  Status:  Discontinued     500 mg 250 mL/hr over 60 Minutes Intravenous Every 24 hours 04/19/13 1224 04/19/13 1317      Assessment/Plan: s/p * No surgery found * Continue NG for now.  If Bowel function improves and NG output decreases will likely clamp NG tomorrow and start advancing diet at that point.    LOS: 6 days    Tamantha Saline C 04/25/2013

## 2013-04-25 NOTE — Progress Notes (Signed)
Patient ambulated around entire ICU, tolerated well till last 25 ft, HR up to 120 and oxygen saturation 91% on 2 liters.  Otherwise uneventful, did leg lifts x4 during ambulation, sat on commode with large amount of flatus resulting.  NGT clamped per Dr. Leticia Penna at this time.  Patient tolerating small amts. Of jello and gingerale without nausea.  Will monitor closely.  Presently up in recliner and resting.  Wife remains at bedside.  Monitoring closely.  HR back to 80's.

## 2013-04-25 NOTE — Progress Notes (Signed)
Subjective: He seems to be doing better. He had a small bowel movement this morning. He is passing flatus. He has no other complaints. His breathing is good. He has no abdominal pain.  Objective: Vital signs in last 24 hours: Temp:  [97.7 F (36.5 C)-99 F (37.2 C)] 98.4 F (36.9 C) (04/26 0756) Pulse Rate:  [44-82] 71 (04/26 0800) Resp:  [12-18] 15 (04/26 0800) BP: (105-163)/(48-89) 147/60 mmHg (04/26 0800) SpO2:  [87 %-99 %] 99 % (04/26 0800) Weight:  [81.5 kg (179 lb 10.8 oz)] 81.5 kg (179 lb 10.8 oz) (04/26 0500) Weight change: 0 kg (0 lb) Last BM Date: 04/24/13  Intake/Output from previous day: 04/25 0701 - 04/26 0700 In: 290 [I.V.:240; IV Piggyback:50] Out: 1350 [Urine:750; Emesis/NG output:600]  PHYSICAL EXAM General appearance: alert, cooperative and no distress Resp: clear to auscultation bilaterally Cardio: irregularly irregular rhythm GI: soft, non-tender; bowel sounds normal; no masses,  no organomegaly and He still has nasogastric tube in place Extremities: extremities normal, atraumatic, no cyanosis or edema  Lab Results:    Basic Metabolic Panel:  Recent Labs  62/13/08 0815 04/25/13 0447  NA 142 147*  K 4.5 4.2  CL 98 101  CO2 37* 40*  GLUCOSE 236* 185*  BUN 73* 68*  CREATININE 1.70* 1.54*  CALCIUM 9.2 9.3   Liver Function Tests: No results found for this basename: AST, ALT, ALKPHOS, BILITOT, PROT, ALBUMIN,  in the last 72 hours No results found for this basename: LIPASE, AMYLASE,  in the last 72 hours No results found for this basename: AMMONIA,  in the last 72 hours CBC:  Recent Labs  04/24/13 0815 04/25/13 0447  WBC 13.8* 12.7*  NEUTROABS 12.5* 11.3*  HGB 11.7* 11.8*  HCT 36.2* 37.2*  MCV 94.5 95.1  PLT 147* 143*   Cardiac Enzymes: No results found for this basename: CKTOTAL, CKMB, CKMBINDEX, TROPONINI,  in the last 72 hours BNP: No results found for this basename: PROBNP,  in the last 72 hours D-Dimer: No results found for this  basename: DDIMER,  in the last 72 hours CBG:  Recent Labs  04/24/13 0528 04/24/13 1138 04/24/13 1555 04/24/13 1755 04/24/13 2351 04/25/13 0537  GLUCAP 184* 231* 170* 186* 210* 173*   Hemoglobin A1C: No results found for this basename: HGBA1C,  in the last 72 hours Fasting Lipid Panel: No results found for this basename: CHOL, HDL, LDLCALC, TRIG, CHOLHDL, LDLDIRECT,  in the last 72 hours Thyroid Function Tests: No results found for this basename: TSH, T4TOTAL, FREET4, T3FREE, THYROIDAB,  in the last 72 hours Anemia Panel: No results found for this basename: VITAMINB12, FOLATE, FERRITIN, TIBC, IRON, RETICCTPCT,  in the last 72 hours Coagulation: No results found for this basename: LABPROT, INR,  in the last 72 hours Urine Drug Screen: Drugs of Abuse  No results found for this basename: labopia, cocainscrnur, labbenz, amphetmu, thcu, labbarb    Alcohol Level: No results found for this basename: ETH,  in the last 72 hours Urinalysis: No results found for this basename: COLORURINE, APPERANCEUR, LABSPEC, PHURINE, GLUCOSEU, HGBUR, BILIRUBINUR, KETONESUR, PROTEINUR, UROBILINOGEN, NITRITE, LEUKOCYTESUR,  in the last 72 hours Misc. Labs:  ABGS No results found for this basename: PHART, PCO2, PO2ART, TCO2, HCO3,  in the last 72 hours CULTURES Recent Results (from the past 240 hour(s))  MRSA PCR SCREENING     Status: None   Collection Time    04/19/13  3:08 PM      Result Value Range Status   MRSA by  PCR NEGATIVE  NEGATIVE Final   Comment:            The GeneXpert MRSA Assay (FDA     approved for NASAL specimens     only), is one component of a     comprehensive MRSA colonization     surveillance program. It is not     intended to diagnose MRSA     infection nor to guide or     monitor treatment for     MRSA infections.   Studies/Results: Ct Abdomen Pelvis Wo Contrast  04/23/2013  *RADIOLOGY REPORT*  Clinical Data: Small bowel obstruction  CT ABDOMEN AND PELVIS WITHOUT  CONTRAST  Technique:  Multidetector CT imaging of the abdomen and pelvis was performed following the standard protocol without intravenous contrast. Oral contrast was given.  Comparison: September 22, 2010.  Findings: Visualized lung bases appear normal.  Status post cholecystectomy.  The liver, spleen and pancreas appear normal. Bilateral renal cysts are noted which are unchanged compared to prior exam.  Adrenal glands are normal.  No hydronephrosis or renal obstruction is noted.  No renal or ureteral calculi are noted. Appendix appears normal.  Sigmoid diverticulosis is noted without inflammation.  Urinary bladder appears normal.  Mild prostatic hypertrophy is noted.  Moderate proximal small bowel dilatation is noted with transition zone seen on image number 45 of series 2. The distal small bowel appears normal in caliber.  No abnormal fluid collection is noted. Small fat containing periumbilical hernia is noted which is stable compared to prior exam.  IMPRESSION: Sigmoid diverticulosis is noted with inflammation.  Old bilateral renal cysts are noted.  Proximal small bowel dilatation is noted with transition zone seen in the central portion of the abdomen. This is concerning for a partial small bowel obstruction potentially due to adhesion.   Original Report Authenticated By: Lupita Raider.,  M.D.     Medications:  Prior to Admission:  Prescriptions prior to admission  Medication Sig Dispense Refill  . acetaminophen (TYLENOL) 500 MG tablet Take 1,000 mg by mouth every 6 (six) hours as needed. Pain      . allopurinol (ZYLOPRIM) 300 MG tablet Take 300 mg by mouth daily.       . Ascorbic Acid (VITAMIN C) 1000 MG tablet Take 1,000 mg by mouth daily.      Marland Kitchen aspirin 81 MG tablet Take 81 mg by mouth daily.        Marland Kitchen diltiazem (DILACOR XR) 240 MG 24 hr capsule Take 240 mg by mouth daily.       . fish oil-omega-3 fatty acids 1000 MG capsule Take 1 g by mouth daily.      . fluticasone (FLOVENT HFA) 110 MCG/ACT  inhaler Inhale 1 puff into the lungs 2 (two) times daily.      . furosemide (LASIX) 40 MG tablet Take 40 mg by mouth daily.        Marland Kitchen glyBURIDE (DIABETA) 2.5 MG tablet Take 2.5 mg by mouth daily.        Marland Kitchen ipratropium-albuterol (DUONEB) 0.5-2.5 (3) MG/3ML SOLN Take 3 mLs by nebulization 4 (four) times daily.      Marland Kitchen lisinopril (PRINIVIL,ZESTRIL) 10 MG tablet Take 10 mg by mouth daily.       Marland Kitchen neomycin-polymyxin-hydrocortisone (CORTISPORIN) otic solution Place 3 drops into both ears daily as needed. Earaches      . potassium chloride SA (K-DUR,KLOR-CON) 20 MEQ tablet Take 20 mEq by mouth daily.  Scheduled: . antiseptic oral rinse  15 mL Mouth Rinse BID  . cefTRIAXone (ROCEPHIN) IVPB 1 gram/50 mL D5W  1 g Intravenous Q24H  . digoxin  0.125 mg Intravenous Daily  . enoxaparin (LOVENOX) injection  80 mg Subcutaneous Q24H  . insulin aspart  0-20 Units Subcutaneous Q6H  . insulin glargine  15 Units Subcutaneous Daily  . ipratropium  0.5 mg Nebulization Q4H while awake  . levalbuterol  1.25 mg Nebulization Q4H while awake  . methylPREDNISolone (SOLU-MEDROL) injection  40 mg Intravenous Q6H  . vitamin C  1,000 mg Oral Daily   Continuous: . diltiazem (CARDIZEM) infusion 10 mg/hr (04/25/13 0800)   KGM:[id number]  Assesment: He was admitted with acute exacerbation of COPD and atrial flutter/fibrillation. He was improving and then developed small bowel obstruction. That seems to be improving. He has chronic renal insufficiency and that was somewhat worse once he had a small bowel obstruction. He has diabetes but does not typically require much for that when he's not acutely sick. Principal Problem:   Acute exacerbation of chronic obstructive pulmonary disease (COPD) Active Problems:   Aortic valve disorders   Atrial flutter   COPD (chronic obstructive pulmonary disease)   Hypertension   Renal insufficiency   Diabetes   Chronic diastolic congestive heart failure   Acute-on-chronic  respiratory failure    Plan: If he is able to remove the NG tube and start on oral medications I would switch him to by mouth diltiazem    LOS: 6 days   Trumaine Wimer L 04/25/2013, 8:30 AM

## 2013-04-25 NOTE — Plan of Care (Signed)
Problem: Consults Goal: General Medical Patient Education See Patient Education Module for specific education.  Outcome: Progressing Patient admitted on 4/20 /2014 in Afib/flutter with RVR started on a cardizem gtt.  Today rate is controlled, Digoxin to be added to regimen in am per Cardiology visit tonight.  Will remain on cardizem until NGT removed and able to take PO medications again.  Partial small bowel obstruction was added to hospital problems with some resolution tonight.  Patient ambulated to bsc tonight with minimal standby assist after sitting in recliner for 2 hrs.  Exercise of leg lifts done and patient had a small marble sized stool.  Bowel sounds active x's 4 quadrants. Goal: Nutrition Consult-if indicated Outcome: Not Progressing Patient remains NPO at this time Goal: Diabetes Guidelines if Diabetic/Glucose > 140 If diabetic or lab glucose is > 140 mg/dl - Initiate Diabetes/Hyperglycemia Guidelines & Document Interventions  Outcome: Progressing Patient on steroids, with elevated blood sugars while NPO  Problem: Phase I Progression Outcomes Goal: Pain controlled with appropriate interventions Outcome: Not Applicable Date Met:  04/25/13 No complaints of pain  Goal: OOB as tolerated unless otherwise ordered Outcome: Progressing Order for Bathroom privleges.  OOB to bsc tonight Goal: Initial discharge plan identified Outcome: Progressing Home with spouse Goal: Hemodynamically stable Outcome: Progressing Remains on cardizem gtt with rate controlled

## 2013-04-26 LAB — BASIC METABOLIC PANEL
CO2: 39 mEq/L — ABNORMAL HIGH (ref 19–32)
Calcium: 9 mg/dL (ref 8.4–10.5)
Creatinine, Ser: 1.44 mg/dL — ABNORMAL HIGH (ref 0.50–1.35)
GFR calc non Af Amer: 46 mL/min — ABNORMAL LOW (ref >90)

## 2013-04-26 LAB — GLUCOSE, CAPILLARY
Glucose-Capillary: 186 mg/dL — ABNORMAL HIGH (ref 70–99)
Glucose-Capillary: 281 mg/dL — ABNORMAL HIGH (ref 70–99)

## 2013-04-26 NOTE — Progress Notes (Signed)
  Subjective: Small bowel movement. No current nausea. Tolerating clear liquids. Continues to pass flatus.  Objective: Vital signs in last 24 hours: Temp:  [97.6 F (36.4 C)-99.4 F (37.4 C)] 98.2 F (36.8 C) (04/27 1130) Pulse Rate:  [56-87] 72 (04/27 0717) Resp:  [13-20] 17 (04/27 1300) BP: (118-156)/(56-128) 134/62 mmHg (04/27 1300) SpO2:  [89 %-100 %] 98 % (04/27 1107) Weight:  [81.7 kg (180 lb 1.9 oz)] 81.7 kg (180 lb 1.9 oz) (04/27 0430) Last BM Date: 04/26/13 (very small hard BM)  Intake/Output from previous day: 04/26 0701 - 04/27 0700 In: 270 [I.V.:220; IV Piggyback:50] Out: 2325 [Urine:825; Emesis/NG output:1500] Intake/Output this shift: Total I/O In: 90 [I.V.:40; IV Piggyback:50] Out: -   General appearance: alert and no distress GI: soft, non-tender; bowel sounds normal; no masses,  no organomegaly  Lab Results:   Recent Labs  04/24/13 0815 04/25/13 0447  WBC 13.8* 12.7*  HGB 11.7* 11.8*  HCT 36.2* 37.2*  PLT 147* 143*   BMET  Recent Labs  04/25/13 0447 04/26/13 0443  NA 147* 146*  K 4.2 3.9  CL 101 100  CO2 40* 39*  GLUCOSE 185* 185*  BUN 68* 65*  CREATININE 1.54* 1.44*  CALCIUM 9.3 9.0   PT/INR No results found for this basename: LABPROT, INR,  in the last 72 hours ABG No results found for this basename: PHART, PCO2, PO2, HCO3,  in the last 72 hours  Studies/Results: No results found.  Anti-infectives: Anti-infectives   Start     Dose/Rate Route Frequency Ordered Stop   04/22/13 1230  azithromycin (ZITHROMAX) tablet 500 mg  Status:  Discontinued     500 mg Oral Daily 04/22/13 1217 04/23/13 0752   04/19/13 1500  azithromycin (ZITHROMAX) 500 mg in dextrose 5 % 250 mL IVPB  Status:  Discontinued     500 mg 250 mL/hr over 60 Minutes Intravenous Every 24 hours 04/19/13 1317 04/22/13 1217   04/19/13 1400  cefTRIAXone (ROCEPHIN) 1 g in dextrose 5 % 50 mL IVPB     1 g 100 mL/hr over 30 Minutes Intravenous Every 24 hours 04/19/13 1317      04/19/13 1230  cefTRIAXone (ROCEPHIN) 1 g in dextrose 5 % 50 mL IVPB  Status:  Discontinued     1 g 100 mL/hr over 30 Minutes Intravenous Every 24 hours 04/19/13 1224 04/19/13 1317   04/19/13 1230  azithromycin (ZITHROMAX) 500 mg in dextrose 5 % 250 mL IVPB  Status:  Discontinued     500 mg 250 mL/hr over 60 Minutes Intravenous Every 24 hours 04/19/13 1224 04/19/13 1317      Assessment/Plan: s/p * No surgery found * Small bowel obstruction appears to be resolving with conservative management. Continue to slowly advance patient's diet. Continue advancing to a low residual diet. We will continue to follow patient's course however this time it looks unlikely that patient will prior surgical intervention.  LOS: 7 days    Sharlet Notaro C 04/26/2013

## 2013-04-26 NOTE — Progress Notes (Signed)
Pt given neb treatment at 0430 when awakened by lab. Had some wheezes made to cough and given Incentive. He started and gave 1500cc volume.

## 2013-04-26 NOTE — Progress Notes (Signed)
Called into patients room, stated he felt nauseated slightly.  Hooked up NGT to suction only 50cc returned.  Patient had drank 500cc (2 ginger ales and ate 2 jello's before bedtime by 2300).  NGT back to clamped.  Listened to bowel sounds, hyperactive now, probably causing queeziness.  Ondansetron 4mg  iv given for nausea.  Patient stated he felt better.  Sleeping at present oxygen saturation decreasing since zofran given.  RT notified and is monitoring the situation.  Patient has a congested persistent cough.  Guaiffenesen ordered to begin when NGT d/c'd.

## 2013-04-26 NOTE — Progress Notes (Signed)
Report called to Deberah Castle, RN. Pt is going to room 330 via wheelchair and family is at bedside and is taking personal belongings with pt.

## 2013-04-27 DIAGNOSIS — I359 Nonrheumatic aortic valve disorder, unspecified: Secondary | ICD-10-CM

## 2013-04-27 LAB — GLUCOSE, CAPILLARY
Glucose-Capillary: 191 mg/dL — ABNORMAL HIGH (ref 70–99)
Glucose-Capillary: 217 mg/dL — ABNORMAL HIGH (ref 70–99)

## 2013-04-27 LAB — BASIC METABOLIC PANEL
BUN: 65 mg/dL — ABNORMAL HIGH (ref 6–23)
CO2: 37 mEq/L — ABNORMAL HIGH (ref 19–32)
Chloride: 99 mEq/L (ref 96–112)
Creatinine, Ser: 1.46 mg/dL — ABNORMAL HIGH (ref 0.50–1.35)
Glucose, Bld: 181 mg/dL — ABNORMAL HIGH (ref 70–99)

## 2013-04-27 MED ORDER — PREDNISONE 20 MG PO TABS
40.0000 mg | ORAL_TABLET | Freq: Every day | ORAL | Status: DC
  Administered 2013-04-27 – 2013-04-29 (×3): 40 mg via ORAL
  Filled 2013-04-27 (×3): qty 2

## 2013-04-27 MED ORDER — DIGOXIN 250 MCG PO TABS
0.2500 mg | ORAL_TABLET | Freq: Every day | ORAL | Status: DC
  Administered 2013-04-27 – 2013-04-29 (×3): 0.25 mg via ORAL
  Filled 2013-04-27 (×3): qty 1

## 2013-04-27 MED ORDER — FLUTICASONE PROPIONATE HFA 110 MCG/ACT IN AERO
1.0000 | INHALATION_SPRAY | Freq: Two times a day (BID) | RESPIRATORY_TRACT | Status: DC
  Administered 2013-04-27 – 2013-04-29 (×5): 1 via RESPIRATORY_TRACT
  Filled 2013-04-27: qty 12

## 2013-04-27 NOTE — Progress Notes (Signed)
NAME:  MILO, SCHREIER               ACCOUNT NO.:  [id number]  MEDICAL RECORD NO.:  [id number]  LOCATION:  A340                          FACILITY:  [facility]  PHYSICIAN:  Kingsley Callander. Ouida Sills, MD       DATE OF BIRTH:  08-16-1936  DATE OF PROCEDURE:  04/26/2013 DATE OF DISCHARGE:                                PROGRESS NOTE   HISTORY:  Mr. Sanroman is alert and in no distress.  His NG tube has been clamped.  He was hooked up briefly last night, and only approximately 50 mL was drained.  He had 1 episode of nausea.  He has not vomited.  He has passed little gas, had a small bowel movement the day before yesterday.  Respiratory status has been stable.  He has had no fever. He remains on IV diltiazem.  PHYSICAL EXAMINATION:  VITAL SIGNS:  Temperature 97.7, pulse 70, respirations 13, blood pressure 132/57, oxygen saturation 98%. LUNGS:  Mild bilateral wheezes. HEART:  Irregular with a controlled rate in the 70s on telemetry with atrial flutter. ABDOMEN:  Soft and nondistended.  No significant tenderness. EXTREMITIES:  Reveal no edema.  IMPRESSION/PLAN: 1. Small bowel obstruction, improved.  He has tolerated his NG tube     being clamped relatively well.  We will leave the decision     regarding removal of the NG tube to Dr. Leticia Penna. 2. Atrial flutter.  Rate is well controlled.  We will try to modify     him to oral diltiazem if his NG tube is removed. 3. Chronic obstructive pulmonary disease.  Continue nebulizer     treatments. 4. Renal insufficiency.  BUN and creatinine are stable at 65 and 1.44. 5. Diabetes.  Glucose this morning is 186. 6. CT April 23, 2013, revealing sigmoid diverticulosis without     inflammation.  There was felt to be a partial small bowel     obstruction, potentially due to adhesion at that time with a     transition zone in the central abdomen.     Kingsley Callander. Ouida Sills, MD     ROF/MEDQ  D:  04/26/2013  T:  04/27/2013  Job:  [id number]

## 2013-04-27 NOTE — Progress Notes (Signed)
Subjective: He feels well. He has no complaints. He is passing flatus but has not had another bowel movement. He is eating well with no nausea and no pain.  Objective: Vital signs in last 24 hours: Temp:  [97.3 F (36.3 C)-98.2 F (36.8 C)] 97.6 F (36.4 C) (04/28 0520) Pulse Rate:  [76-83] 76 (04/28 0520) Resp:  [14-20] 20 (04/28 0520) BP: (126-160)/(60-74) 158/60 mmHg (04/28 0520) SpO2:  [95 %-99 %] 97 % (04/28 0721) Weight change:  Last BM Date: 04/26/13 (small)  Intake/Output from previous day: 04/27 0701 - 04/28 0700 In: 330 [P.O.:240; I.V.:40; IV Piggyback:50] Out: -   PHYSICAL EXAM General appearance: alert, cooperative and no distress Resp: clear to auscultation bilaterally Cardio: irregularly irregular rhythm GI: soft, non-tender; bowel sounds normal; no masses,  no organomegaly Extremities: extremities normal, atraumatic, no cyanosis or edema  Lab Results:    Basic Metabolic Panel:  Recent Labs  69/62/95 0443 04/27/13 0537  NA 146* 142  K 3.9 4.2  CL 100 99  CO2 39* 37*  GLUCOSE 185* 181*  BUN 65* 65*  CREATININE 1.44* 1.46*  CALCIUM 9.0 8.7   Liver Function Tests: No results found for this basename: AST, ALT, ALKPHOS, BILITOT, PROT, ALBUMIN,  in the last 72 hours No results found for this basename: LIPASE, AMYLASE,  in the last 72 hours No results found for this basename: AMMONIA,  in the last 72 hours CBC:  Recent Labs  04/25/13 0447  WBC 12.7*  NEUTROABS 11.3*  HGB 11.8*  HCT 37.2*  MCV 95.1  PLT 143*   Cardiac Enzymes: No results found for this basename: CKTOTAL, CKMB, CKMBINDEX, TROPONINI,  in the last 72 hours BNP: No results found for this basename: PROBNP,  in the last 72 hours D-Dimer: No results found for this basename: DDIMER,  in the last 72 hours CBG:  Recent Labs  04/25/13 2343 04/26/13 0605 04/26/13 1125 04/26/13 1637 04/26/13 2259 04/27/13 0517  GLUCAP 257* 186* 281* 199* 256* 167*   Hemoglobin A1C: No results  found for this basename: HGBA1C,  in the last 72 hours Fasting Lipid Panel: No results found for this basename: CHOL, HDL, LDLCALC, TRIG, CHOLHDL, LDLDIRECT,  in the last 72 hours Thyroid Function Tests: No results found for this basename: TSH, T4TOTAL, FREET4, T3FREE, THYROIDAB,  in the last 72 hours Anemia Panel: No results found for this basename: VITAMINB12, FOLATE, FERRITIN, TIBC, IRON, RETICCTPCT,  in the last 72 hours Coagulation: No results found for this basename: LABPROT, INR,  in the last 72 hours Urine Drug Screen: Drugs of Abuse  No results found for this basename: labopia, cocainscrnur, labbenz, amphetmu, thcu, labbarb    Alcohol Level: No results found for this basename: ETH,  in the last 72 hours Urinalysis: No results found for this basename: COLORURINE, APPERANCEUR, LABSPEC, PHURINE, GLUCOSEU, HGBUR, BILIRUBINUR, KETONESUR, PROTEINUR, UROBILINOGEN, NITRITE, LEUKOCYTESUR,  in the last 72 hours Misc. Labs:  ABGS No results found for this basename: PHART, PCO2, PO2ART, TCO2, HCO3,  in the last 72 hours CULTURES Recent Results (from the past 240 hour(s))  MRSA PCR SCREENING     Status: None   Collection Time    04/19/13  3:08 PM      Result Value Range Status   MRSA by PCR NEGATIVE  NEGATIVE Final   Comment:            The GeneXpert MRSA Assay (FDA     approved for NASAL specimens     only), is one  component of a     comprehensive MRSA colonization     surveillance program. It is not     intended to diagnose MRSA     infection nor to guide or     monitor treatment for     MRSA infections.   Studies/Results: No results found.  Medications:  Prior to Admission:  Prescriptions prior to admission  Medication Sig Dispense Refill  . acetaminophen (TYLENOL) 500 MG tablet Take 1,000 mg by mouth every 6 (six) hours as needed. Pain      . allopurinol (ZYLOPRIM) 300 MG tablet Take 300 mg by mouth daily.       . Ascorbic Acid (VITAMIN C) 1000 MG tablet Take 1,000 mg  by mouth daily.      Marland Kitchen aspirin 81 MG tablet Take 81 mg by mouth daily.        Marland Kitchen diltiazem (DILACOR XR) 240 MG 24 hr capsule Take 240 mg by mouth daily.       . fish oil-omega-3 fatty acids 1000 MG capsule Take 1 g by mouth daily.      . fluticasone (FLOVENT HFA) 110 MCG/ACT inhaler Inhale 1 puff into the lungs 2 (two) times daily.      . furosemide (LASIX) 40 MG tablet Take 40 mg by mouth daily.        Marland Kitchen glyBURIDE (DIABETA) 2.5 MG tablet Take 2.5 mg by mouth daily.        Marland Kitchen ipratropium-albuterol (DUONEB) 0.5-2.5 (3) MG/3ML SOLN Take 3 mLs by nebulization 4 (four) times daily.      Marland Kitchen lisinopril (PRINIVIL,ZESTRIL) 10 MG tablet Take 10 mg by mouth daily.       Marland Kitchen neomycin-polymyxin-hydrocortisone (CORTISPORIN) otic solution Place 3 drops into both ears daily as needed. Earaches      . potassium chloride SA (K-DUR,KLOR-CON) 20 MEQ tablet Take 20 mEq by mouth daily.         Scheduled: . antiseptic oral rinse  15 mL Mouth Rinse BID  . cefTRIAXone (ROCEPHIN) IVPB 1 gram/50 mL D5W  1 g Intravenous Q24H  . digoxin  0.125 mg Intravenous Daily  . diltiazem  60 mg Oral Q6H  . enoxaparin (LOVENOX) injection  80 mg Subcutaneous Q24H  . fluticasone  1 puff Inhalation BID  . guaiFENesin  600 mg Oral BID  . insulin aspart  0-20 Units Subcutaneous Q6H  . insulin glargine  15 Units Subcutaneous Daily  . ipratropium  0.5 mg Nebulization Q4H while awake  . levalbuterol  1.25 mg Nebulization Q4H while awake  . predniSONE  40 mg Oral Q breakfast  . vitamin C  1,000 mg Oral Daily   Continuous:  ZOX:[id number]  Assesment: He was admitted with acute exacerbation of COPD and atrial flutter/fibrillation with rapid ventricular response. He has improved markedly. He developed a small bowel obstruction but has been able to be treated nonsurgically. His renal dysfunction was worse but has improved again. Principal Problem:   Acute exacerbation of chronic obstructive pulmonary disease (COPD) Active Problems:    Aortic valve disorders   Atrial flutter   COPD (chronic obstructive pulmonary disease)   Hypertension   Renal insufficiency   Diabetes   Chronic diastolic congestive heart failure   Acute-on-chronic respiratory failure    Plan: Advance his diet. I will restart Flovent. I will take him off IV steroids and put him on prednisone    LOS: 8 days   Glenora Morocho L 04/27/2013, 8:20 AM

## 2013-04-27 NOTE — Consult Note (Signed)
SUBJECTIVE: Feeling better, eating and drinking without abdominal pain. No shortness of breath.  Principal Problem:   Acute exacerbation of chronic obstructive pulmonary disease (COPD) Active Problems:   Aortic valve disorders   Atrial flutter   COPD (chronic obstructive pulmonary disease)   Hypertension   Renal insufficiency   Diabetes   Chronic diastolic congestive heart failure   Acute-on-chronic respiratory failure   LABS: Basic Metabolic Panel:  Recent Labs  81/19/14 0443 04/27/13 0537  NA 146* 142  K 3.9 4.2  CL 100 99  CO2 39* 37*  GLUCOSE 185* 181*  BUN 65* 65*  CREATININE 1.44* 1.46*  CALCIUM 9.0 8.7   CBC:  Recent Labs  04/25/13 0447  WBC 12.7*  NEUTROABS 11.3*  HGB 11.8*  HCT 37.2*  MCV 95.1  PLT 143*      PHYSICAL EXAM BP 158/60  Pulse 76  Temp(Src) 97.6 F (36.4 C) (Oral)  Resp 20  Ht 5\' 6"  (1.676 m)  Wt 180 lb 1.9 oz (81.7 kg)  BMI 29.09 kg/m2  SpO2 97% General: Well developed, well nourished, in no acute distress Head:  Normal cephalic and atramatic  Lungs: Decreased air flow but moving air.  No rales.   Heart: HR regularly irregular.  S1 S2, No MRG Abdomen: Bowel sounds are positive but decreased., abdomen soft and non-tender without masses or                  Hernia's noted. Extremities: No clubbing, cyanosis or edema.  DP +1 Neuro: Alert and oriented X 3. Psych:  Good affect, responds appropriately  TELEMETRY: Reviewed telemetry pt in: Atrial flutter rates in the 80's.   ASSESSMENT AND PLAN: 1. Atrial flutter with RVR: In the setting of COPD exacerbation. CHADS Score 2. Has transitioned to PO diltiazem 60 mg Q 6 hours and po Digoxin. Will change to long acting diltiazem tomorrow in anticipation of discharge.  Lovenox is now being utilized Will continue this for one more day as he is on clear liquids with increasing diet to solids. Await bowel movements before placing back on Xarelto.  2. S/P bioprosthetic AVR with MAZE  procedure: Completed in August of 2011, after cardiac cath demonstrating normal coronaries.   3. COPD Exacerbation: Significant improvement in breathing status. Up in the room walking without dyspnea.  4. Hypertension: Blood pressure is low normal, labile in the low 90 to 118 systolic.   5. SBO: Resolved. No surgery planned at present.      Bettey Mare. Lyman Bishop NP [facility] 04/27/2013, 9:33 AM  Patient seen and examined.  I have amended note above (PE and conclusion) based on my findings. Patient is improveing  Transioning to PO  Keep on Lovenox 1 more day to assure GI problems resolved before starting Xarelto. Dietrich Pates

## 2013-04-27 NOTE — Progress Notes (Signed)
  Subjective: No complaints of pain. Positive flatus. Tolerating diet advancement. No nausea. No bowel movement over the last 2 days.  Objective: Vital signs in last 24 hours: Temp:  [97.3 F (36.3 C)-98.2 F (36.8 C)] 97.6 F (36.4 C) (04/28 0520) Pulse Rate:  [76-83] 76 (04/28 0520) Resp:  [16-20] 20 (04/28 0520) BP: (129-160)/(60-74) 158/60 mmHg (04/28 0520) SpO2:  [95 %-99 %] 97 % (04/28 1120) Last BM Date: 04/26/13  Intake/Output from previous day: 04/27 0701 - 04/28 0700 In: 330 [P.O.:240; I.V.:40; IV Piggyback:50] Out: -  Intake/Output this shift:    General appearance: alert and no distress GI: soft, non-tender; bowel sounds normal; no masses,  no organomegaly  Lab Results:   Recent Labs  04/25/13 0447  WBC 12.7*  HGB 11.8*  HCT 37.2*  PLT 143*   BMET  Recent Labs  04/26/13 0443 04/27/13 0537  NA 146* 142  K 3.9 4.2  CL 100 99  CO2 39* 37*  GLUCOSE 185* 181*  BUN 65* 65*  CREATININE 1.44* 1.46*  CALCIUM 9.0 8.7   PT/INR No results found for this basename: LABPROT, INR,  in the last 72 hours ABG No results found for this basename: PHART, PCO2, PO2, HCO3,  in the last 72 hours  Studies/Results: No results found.  Anti-infectives: Anti-infectives   Start     Dose/Rate Route Frequency Ordered Stop   04/22/13 1230  azithromycin (ZITHROMAX) tablet 500 mg  Status:  Discontinued     500 mg Oral Daily 04/22/13 1217 04/23/13 0752   04/19/13 1500  azithromycin (ZITHROMAX) 500 mg in dextrose 5 % 250 mL IVPB  Status:  Discontinued     500 mg 250 mL/hr over 60 Minutes Intravenous Every 24 hours 04/19/13 1317 04/22/13 1217   04/19/13 1400  cefTRIAXone (ROCEPHIN) 1 g in dextrose 5 % 50 mL IVPB     1 g 100 mL/hr over 30 Minutes Intravenous Every 24 hours 04/19/13 1317     04/19/13 1230  cefTRIAXone (ROCEPHIN) 1 g in dextrose 5 % 50 mL IVPB  Status:  Discontinued     1 g 100 mL/hr over 30 Minutes Intravenous Every 24 hours 04/19/13 1224 04/19/13 1317   04/19/13 1230  azithromycin (ZITHROMAX) 500 mg in dextrose 5 % 250 mL IVPB  Status:  Discontinued     500 mg 250 mL/hr over 60 Minutes Intravenous Every 24 hours 04/19/13 1224 04/19/13 1317      Assessment/Plan: s/p * No surgery found * Continue advancement of diet as tolerated. Should patient continue to tolerate advancement to regular diet as well as continued bowel function patient may be discharged from my standpoint. Currently no acute surgical indications. I will continue to follow his course of couple days. Hopeful discharge soon.  LOS: 8 days    Latash Nouri C 04/27/2013

## 2013-04-27 NOTE — Progress Notes (Signed)
Physical Therapy Treatment Patient Details Name: [name] [name] MRN: [id number] DOB: 1936-10-24 Today's Date: 04/27/2013 Time: 4098-1191 PT Time Calculation (min): 31 min  PT Assessment / Plan / Recommendation Comments on Treatment Session  Pt' strength and mobility have improved.  He was able to ambulate with no AD and good stability, however did need the walker for energy conservation and he was on RA and O2 sat decreased to 86% with exertion.  His O2 sat rebounded to 93% when placed back on O2.  He does not wish to have a walker at this time and would like to progress his activity level at home without HHPT    Follow Up Recommendations  No PT follow up     Does the patient have the potential to tolerate intense rehabilitation     Barriers to Discharge        Equipment Recommendations  None recommended by PT    Recommendations for Other Services    Frequency     Plan Discharge plan needs to be updated;Equipment recommendations need to be updated    Precautions / Restrictions     Pertinent Vitals/Pain     Mobility  Bed Mobility Supine to Sit: 6: Modified independent (Device/Increase time) Transfers Sit to Stand: 6: Modified independent (Device/Increase time) Stand to Sit: 5: Supervision Details for Transfer Assistance: pt needed instruction for reaching for chair arm prior to sitting Ambulation/Gait Ambulation/Gait Assistance: 5: Supervision Ambulation Distance (Feet): 150 Feet Assistive device: Rolling walker;None Ambulation/Gait Assistance Details: pt is able to ambulate with no AD with good stability.  His O2 sat decreased to 86% during gait on RA, so he was given the walker to help decrease his exertional level Gait Pattern: Within Functional Limits Gait velocity: WNL Stairs:  (pt declined)    Exercises General Exercises - Lower Extremity Toe Raises: AROM;Both;10 reps;Standing Mini-Sqauts: AROM;Both;10 reps;Standing   PT Diagnosis:    PT Problem List:   PT  Treatment Interventions:     PT Goals Acute Rehab PT Goals PT Goal: Ambulate - Progress: Progressing toward goal Pt will Perform Home Exercise Program: Independently PT Goal: Perform Home Exercise Program - Progress: Met  Visit Information  Last PT Received On: 04/27/13    Subjective Data  Subjective: no c/o   Cognition       Balance     End of Session PT - End of Session Equipment Utilized During Treatment: Gait belt Activity Tolerance: Patient tolerated treatment well Patient left: in bed;with call bell/phone within reach;with family/visitor present   GP     Konrad Penta 04/27/2013, 4:32 PM

## 2013-04-28 DIAGNOSIS — I4891 Unspecified atrial fibrillation: Secondary | ICD-10-CM

## 2013-04-28 LAB — GLUCOSE, CAPILLARY: Glucose-Capillary: 116 mg/dL — ABNORMAL HIGH (ref 70–99)

## 2013-04-28 MED ORDER — GLIPIZIDE 5 MG PO TABS
2.5000 mg | ORAL_TABLET | Freq: Every day | ORAL | Status: DC
  Administered 2013-04-28 – 2013-04-29 (×2): 2.5 mg via ORAL
  Filled 2013-04-28 (×2): qty 1

## 2013-04-28 MED ORDER — DILTIAZEM HCL ER COATED BEADS 240 MG PO CP24
240.0000 mg | ORAL_CAPSULE | Freq: Every day | ORAL | Status: DC
  Administered 2013-04-28 – 2013-04-29 (×2): 240 mg via ORAL
  Filled 2013-04-28 (×2): qty 1

## 2013-04-28 MED ORDER — TRAZODONE HCL 50 MG PO TABS
50.0000 mg | ORAL_TABLET | Freq: Every evening | ORAL | Status: DC | PRN
  Administered 2013-04-28: 50 mg via ORAL
  Filled 2013-04-28: qty 1

## 2013-04-28 MED ORDER — RIVAROXABAN 10 MG PO TABS
20.0000 mg | ORAL_TABLET | Freq: Every day | ORAL | Status: DC
  Administered 2013-04-28: 20 mg via ORAL
  Filled 2013-04-28: qty 1
  Filled 2013-04-28: qty 2

## 2013-04-28 NOTE — Progress Notes (Signed)
  Subjective: Tolerating by mouth. No nausea. Positive bowel movements. Feels good.  Objective: Vital signs in last 24 hours: Temp:  [97.6 F (36.4 C)-98.1 F (36.7 C)] 97.8 F (36.6 C) (04/29 0517) Pulse Rate:  [73-76] 73 (04/29 0517) Resp:  [18-20] 19 (04/29 0517) BP: (137-143)/(57-62) 143/57 mmHg (04/29 0517) SpO2:  [95 %-97 %] 96 % (04/29 1111) Last BM Date: 04/27/13  Intake/Output from previous day:   Intake/Output this shift: Total I/O In: 240 [P.O.:240] Out: -   General appearance: alert and no distress GI: soft, non-tender; bowel sounds normal; no masses,  no organomegaly  Lab Results:  No results found for this basename: WBC, HGB, HCT, PLT,  in the last 72 hours BMET  Recent Labs  04/26/13 0443 04/27/13 0537  NA 146* 142  K 3.9 4.2  CL 100 99  CO2 39* 37*  GLUCOSE 185* 181*  BUN 65* 65*  CREATININE 1.44* 1.46*  CALCIUM 9.0 8.7   PT/INR No results found for this basename: LABPROT, INR,  in the last 72 hours ABG No results found for this basename: PHART, PCO2, PO2, HCO3,  in the last 72 hours  Studies/Results: No results found.  Anti-infectives: Anti-infectives   Start     Dose/Rate Route Frequency Ordered Stop   04/22/13 1230  azithromycin (ZITHROMAX) tablet 500 mg  Status:  Discontinued     500 mg Oral Daily 04/22/13 1217 04/23/13 0752   04/19/13 1500  azithromycin (ZITHROMAX) 500 mg in dextrose 5 % 250 mL IVPB  Status:  Discontinued     500 mg 250 mL/hr over 60 Minutes Intravenous Every 24 hours 04/19/13 1317 04/22/13 1217   04/19/13 1400  cefTRIAXone (ROCEPHIN) 1 g in dextrose 5 % 50 mL IVPB     1 g 100 mL/hr over 30 Minutes Intravenous Every 24 hours 04/19/13 1317     04/19/13 1230  cefTRIAXone (ROCEPHIN) 1 g in dextrose 5 % 50 mL IVPB  Status:  Discontinued     1 g 100 mL/hr over 30 Minutes Intravenous Every 24 hours 04/19/13 1224 04/19/13 1317   04/19/13 1230  azithromycin (ZITHROMAX) 500 mg in dextrose 5 % 250 mL IVPB  Status:   Discontinued     500 mg 250 mL/hr over 60 Minutes Intravenous Every 24 hours 04/19/13 1224 04/19/13 1317      Assessment/Plan: s/p * No surgery found * Overall doing well. Discharge per Dr. [name] likely tomorrow. No acute surgical findings. Available as needed.  LOS: 9 days    Sarayah Bacchi C 04/28/2013

## 2013-04-28 NOTE — Progress Notes (Signed)
SUBJECTIVE: Daily and breathing much better. Wants to go home.  LABS: Basic Metabolic Panel:  Recent Labs  16/10/96 0443 04/27/13 0537  NA 146* 142  K 3.9 4.2  CL 100 99  CO2 39* 37*  GLUCOSE 185* 181*  BUN 65* 65*  CREATININE 1.44* 1.46*  CALCIUM 9.0 8.7   PHYSICAL EXAM BP 143/57  Pulse 73  Temp(Src) 97.8 F (36.6 C) (Oral)  Resp 19  Ht 5\' 6"  (1.676 m)  Wt 180 lb 1.9 oz (81.7 kg)  BMI 29.09 kg/m2  SpO2 95% General: Well developed, well nourished, in no acute distress Head: Eyes PERRLA, No xanthomas.   Normal cephalic and atramatic  Lungs: Expiratory wheeze with markedly prolonged expiratory phase. Heart: HRIR S1 S2, No MRG .  Pulses are 2+ & equal.            No carotid bruit. No JVD.  No abdominal bruits. No femoral bruits. Abdomen: Bowel sounds are positive, abdomen soft and non-tender without masses or                  Hernia's noted. Msk:  Back normal, normal gait. Normal strength and tone for age. Extremities: No clubbing, cyanosis or edema.  DP +1 Neuro: Alert and oriented X 3. Psych:  Good affect, responds appropriately  TELEMETRY: Reviewed: Atrial flutter rates in the 70's at rest; intermittently increase to a peak of 121 bpm when conduction was 2:1.  ASSESSMENT AND PLAN:  1. Atrial flutter with RVR: In the setting of COPD exacerbation. CHADS Score 2. He has transitioned to PO diltiazem. Will change to long acting diltiazem 240 mg daily and restart Xarelto in anticipation of discharge in am.  With heart rate ranging from 70-121, rate control is adequate.   2. S/P bioprosthetic AVR with MAZE procedure: Completed in August of 2011, after cardiac cath demonstrating normal coronaries.   3. COPD Exacerbation: Significant improvement in breathing status. Up in the room walking without dyspnea, but still has impressive physical findings.   4. Hypertension: Controlled. No changes in medications.   5. SBO: Resolved. No surgery planned at present.   Bettey Mare.  Lyman Bishop NP [facility] 04/28/2013, 11:04 AM  Cardiology Attending Patient interviewed and examined. Discussed with Joni Reining, NP.  Above note annotated and modified based upon my findings.  GFR 45-adequate for full dose rivaroxaban.  Heart rate well controlled.  No further cardiac testing or therapy anticipated during this hospital admission.    Robinhood Bing, MD 04/28/2013, 6:30 PM

## 2013-04-29 DIAGNOSIS — K56609 Unspecified intestinal obstruction, unspecified as to partial versus complete obstruction: Secondary | ICD-10-CM | POA: Diagnosis not present

## 2013-04-29 LAB — GLUCOSE, CAPILLARY

## 2013-04-29 MED ORDER — METHYLPREDNISOLONE 4 MG PO KIT: PACK | ORAL | Status: DC

## 2013-04-29 MED ORDER — RIVAROXABAN 20 MG PO TABS: 20.0000 mg | ORAL_TABLET | Freq: Every day | ORAL | Status: DC

## 2013-04-29 NOTE — Progress Notes (Signed)
Subjective: He has no complaints. Breathing is doing well. He has no abdominal problems.   Objective: Vital signs in last 24 hours: Temp:  [97.9 F (36.6 C)-98.8 F (37.1 C)] 97.9 F (36.6 C) (04/30 0449) Pulse Rate:  [73-82] 73 (04/30 0449) Resp:  [17-20] 17 (04/30 0449) BP: (112-124)/(58-67) 124/58 mmHg (04/30 0449) SpO2:  [93 %-97 %] 97 % (04/30 0449) Weight change:  Last BM Date: 04/28/13  Intake/Output from previous day: 04/29 0701 - 04/30 0700 In: 680 [P.O.:680] Out: -   PHYSICAL EXAM General appearance: alert, cooperative and no distress Resp: clear to auscultation bilaterally Cardio: regular rate and rhythm, S1, S2 normal, no murmur, click, rub or gallop GI: soft, non-tender; bowel sounds normal; no masses,  no organomegaly Extremities: extremities normal, atraumatic, no cyanosis or edema  Lab Results:    Basic Metabolic Panel:  Recent Labs  16/10/96 0537  NA 142  K 4.2  CL 99  CO2 37*  GLUCOSE 181*  BUN 65*  CREATININE 1.46*  CALCIUM 8.7   Liver Function Tests: No results found for this basename: AST, ALT, ALKPHOS, BILITOT, PROT, ALBUMIN,  in the last 72 hours No results found for this basename: LIPASE, AMYLASE,  in the last 72 hours No results found for this basename: AMMONIA,  in the last 72 hours CBC: No results found for this basename: WBC, NEUTROABS, HGB, HCT, MCV, PLT,  in the last 72 hours Cardiac Enzymes: No results found for this basename: CKTOTAL, CKMB, CKMBINDEX, TROPONINI,  in the last 72 hours BNP: No results found for this basename: PROBNP,  in the last 72 hours D-Dimer: No results found for this basename: DDIMER,  in the last 72 hours CBG:  Recent Labs  04/28/13 0547 04/28/13 1127 04/28/13 1637 04/29/13 0021 04/29/13 0614 04/29/13 0712  GLUCAP 116* 228* 154* 158* 114* 106*   Hemoglobin A1C: No results found for this basename: HGBA1C,  in the last 72 hours Fasting Lipid Panel: No results found for this basename: CHOL,  HDL, LDLCALC, TRIG, CHOLHDL, LDLDIRECT,  in the last 72 hours Thyroid Function Tests: No results found for this basename: TSH, T4TOTAL, FREET4, T3FREE, THYROIDAB,  in the last 72 hours Anemia Panel: No results found for this basename: VITAMINB12, FOLATE, FERRITIN, TIBC, IRON, RETICCTPCT,  in the last 72 hours Coagulation: No results found for this basename: LABPROT, INR,  in the last 72 hours Urine Drug Screen: Drugs of Abuse  No results found for this basename: labopia, cocainscrnur, labbenz, amphetmu, thcu, labbarb    Alcohol Level: No results found for this basename: ETH,  in the last 72 hours Urinalysis: No results found for this basename: COLORURINE, APPERANCEUR, LABSPEC, PHURINE, GLUCOSEU, HGBUR, BILIRUBINUR, KETONESUR, PROTEINUR, UROBILINOGEN, NITRITE, LEUKOCYTESUR,  in the last 72 hours Misc. Labs:  ABGS No results found for this basename: PHART, PCO2, PO2ART, TCO2, HCO3,  in the last 72 hours CULTURES Recent Results (from the past 240 hour(s))  MRSA PCR SCREENING     Status: None   Collection Time    04/19/13  3:08 PM      Result Value Range Status   MRSA by PCR NEGATIVE  NEGATIVE Final   Comment:            The GeneXpert MRSA Assay (FDA     approved for NASAL specimens     only), is one component of a     comprehensive MRSA colonization     surveillance program. It is not     intended to diagnose  MRSA     infection nor to guide or     monitor treatment for     MRSA infections.   Studies/Results: No results found.  Medications:  Prior to Admission:  Prescriptions prior to admission  Medication Sig Dispense Refill  . acetaminophen (TYLENOL) 500 MG tablet Take 1,000 mg by mouth every 6 (six) hours as needed. Pain      . allopurinol (ZYLOPRIM) 300 MG tablet Take 300 mg by mouth daily.       . Ascorbic Acid (VITAMIN C) 1000 MG tablet Take 1,000 mg by mouth daily.      Marland Kitchen aspirin 81 MG tablet Take 81 mg by mouth daily.        Marland Kitchen diltiazem (DILACOR XR) 240 MG 24 hr  capsule Take 240 mg by mouth daily.       . fish oil-omega-3 fatty acids 1000 MG capsule Take 1 g by mouth daily.      . fluticasone (FLOVENT HFA) 110 MCG/ACT inhaler Inhale 1 puff into the lungs 2 (two) times daily.      . furosemide (LASIX) 40 MG tablet Take 40 mg by mouth daily.        Marland Kitchen glyBURIDE (DIABETA) 2.5 MG tablet Take 2.5 mg by mouth daily.        Marland Kitchen ipratropium-albuterol (DUONEB) 0.5-2.5 (3) MG/3ML SOLN Take 3 mLs by nebulization 4 (four) times daily.      Marland Kitchen lisinopril (PRINIVIL,ZESTRIL) 10 MG tablet Take 10 mg by mouth daily.       Marland Kitchen neomycin-polymyxin-hydrocortisone (CORTISPORIN) otic solution Place 3 drops into both ears daily as needed. Earaches      . potassium chloride SA (K-DUR,KLOR-CON) 20 MEQ tablet Take 20 mEq by mouth daily.         Scheduled: . antiseptic oral rinse  15 mL Mouth Rinse BID  . cefTRIAXone (ROCEPHIN) IVPB 1 gram/50 mL D5W  1 g Intravenous Q24H  . digoxin  0.25 mg Oral Daily  . diltiazem  240 mg Oral Daily  . fluticasone  1 puff Inhalation BID  . glipiZIDE  2.5 mg Oral QAC breakfast  . guaiFENesin  600 mg Oral BID  . insulin aspart  0-20 Units Subcutaneous Q6H  . insulin glargine  15 Units Subcutaneous Daily  . ipratropium  0.5 mg Nebulization Q4H while awake  . levalbuterol  1.25 mg Nebulization Q4H while awake  . predniSONE  40 mg Oral Q breakfast  . rivaroxaban  20 mg Oral Q supper  . vitamin C  1,000 mg Oral Daily   Continuous:  ZOX:[id number], traZODone  Assesment: He was admitted with acute exacerbation of COPD and atrial fibrillation with rapid ventricular response. He has been having atrial flutter. He had worsening problems with renal insufficiency but that has improved. Developed small bowel obstruction which has resolved without surgery. He is much better and ready for discharge Principal Problem:   Acute exacerbation of chronic obstructive pulmonary disease (COPD) Active Problems:   Aortic valve disorders   Atrial flutter   COPD  (chronic obstructive pulmonary disease)   Hypertension   Renal insufficiency   Diabetes   Chronic diastolic congestive heart failure   Acute-on-chronic respiratory failure   Small bowel obstruction    Plan: Discharge home    LOS: 10 days   [name] [name] L 04/29/2013, 8:10 AM

## 2013-04-29 NOTE — Progress Notes (Signed)
Patient evaluated for community based chronic disease management services with [facility] Care Management Program as a benefit of patient's [employer] [place] [employer]. Patient will receive a post discharge transition of care call and will be evaluated for monthly home visits for assessments and disease process education. Spoke with patient via phone in his room at [facility] to explain [facility] Care Management services.  Patient has accepted services and given verbal consent to be contacted at home.  His address and contact information have been verified.  Patient has been respiratory stable for some time and now has concerns of how to distinguish between allergy and acute COPD symptoms.  Made inpatient Case Manager aware that [facility] Care Management following. Of note, [facility] Care Management services does not replace or interfere with any services that are arranged by inpatient case management or social work.  For additional questions or referrals please contact Anibal Henderson BSN RN [facility] Liaison at [phone].

## 2013-04-29 NOTE — Progress Notes (Signed)
Pt and his wife verbalize understanding of d/c instructions. No questions at this time. Reviewed follow up appt, medications and when to call the doctor. IV d/c. Pt d/c via wheelchair, accompanied by NT and his wife. Sheryn Bison

## 2013-04-29 NOTE — Progress Notes (Signed)
SUBJECTIVE: Feeling better each day. He is anxious to return home.  Principal Problem:   Acute exacerbation of chronic obstructive pulmonary disease (COPD) Active Problems:   Aortic valve disorders   Atrial flutter   COPD (chronic obstructive pulmonary disease)   Hypertension   Renal insufficiency   Diabetes   Chronic diastolic congestive heart failure   Acute-on-chronic respiratory failure   Small bowel obstruction  LABS: Basic Metabolic Panel:  Recent Labs  16/10/96 0537  NA 142  K 4.2  CL 99  CO2 37*  GLUCOSE 181*  BUN 65*  CREATININE 1.46*  CALCIUM 8.7   PHYSICAL EXAM BP 124/58  Pulse 73  Temp(Src) 97.9 F (36.6 C) (Oral)  Resp 17  Ht 5\' 6"  (1.676 m)  Wt 180 lb 1.9 oz (81.7 kg)  BMI 29.09 kg/m2  SpO2 97% General: Well developed, well nourished, in no acute distress Head: Eyes PERRLA, No xanthomas.   Normal cephalic and atramatic  Lungs: Mild inspiratory wheezes, no rhonchi or coughing. Heart: HRIR S1 S2, No MRG .  Pulses are 2+ & equal.            No carotid bruit. No JVD.  No abdominal bruits. No femoral bruits. Abdomen: Bowel sounds are positive, abdomen soft and non-tender without masses or                  Hernia's noted. Msk:  Back normal, normal gait. Normal strength and tone for age. Extremities: No clubbing, cyanosis or edema.  DP +1 Neuro: Alert and oriented X 3. Psych:  Good affect, responds appropriately  TELEMETRY: Reviewed telemetry pt in: Atrial flutter with PVC's.  ASSESSMENT AND PLAN:  1. Atrial Flutter: Heart rate is well-controlled, he is having frequent PVCs. He remains on Xarelto 20 mg daily. Diltiazem has been change to long-acting 240 mg daily for ease of administration with continuation of digoxin 0.25 mg daily. Most recent labs completed on 04/27/2013 demonstrated potassium of 4.2 with a creatinine of 1.46. We will make followup appointment post hospitalization with cardiology for ongoing evaluation and management. Scheduled for  05/13/2013.  2. COPD: Continue ongoing treatment per Dr. [name]  3. SBO: Resolved  Bettey Mare. Lyman Bishop NP [facility] 04/29/2013, 9:18 AM  Cardiology Attending Patient interviewed and examined. Discussed with Joni Reining, NP.  Above note annotated and modified based upon my findings.  Heart rate control remains adequate, and patient is tolerating anticoagulation with a novel agent. Digoxin dose of 0.25 mg is marginal with a creatinine of 1.46. We will plan to obtain a serum level in 2 weeks. Agree with plans for discharge.   Ephrata Bing, MD 04/29/2013, 5:35 PM

## 2013-04-29 NOTE — Discharge Summary (Signed)
Physician Discharge Summary  Patient ID: [name] [name] MRN: [id number] DOB/AGE: 1936-05-22 77 y.o. Primary Care Physician:Danaya Geddis L, MD Admit date: 04/19/2013 Discharge date: 04/29/2013    Discharge Diagnoses:   Principal Problem:   Acute exacerbation of chronic obstructive pulmonary disease (COPD) Active Problems:   Aortic valve disorders   Atrial flutter   COPD (chronic obstructive pulmonary disease)   Hypertension   Renal insufficiency   Diabetes   Chronic diastolic congestive heart failure   Acute-on-chronic respiratory failure   Small bowel obstruction     Medication List    TAKE these medications       acetaminophen 500 MG tablet  Commonly known as:  TYLENOL  Take 1,000 mg by mouth every 6 (six) hours as needed. Pain     allopurinol 300 MG tablet  Commonly known as:  ZYLOPRIM  Take 300 mg by mouth daily.     aspirin 81 MG tablet  Take 81 mg by mouth daily.     diltiazem 240 MG 24 hr capsule  Commonly known as:  DILACOR XR  Take 240 mg by mouth daily.     fish oil-omega-3 fatty acids 1000 MG capsule  Take 1 g by mouth daily.     fluticasone 110 MCG/ACT inhaler  Commonly known as:  FLOVENT HFA  Inhale 1 puff into the lungs 2 (two) times daily.     furosemide 40 MG tablet  Commonly known as:  LASIX  Take 40 mg by mouth daily.     glyBURIDE 2.5 MG tablet  Commonly known as:  DIABETA  Take 2.5 mg by mouth daily.     ipratropium-albuterol 0.5-2.5 (3) MG/3ML Soln  Commonly known as:  DUONEB  Take 3 mLs by nebulization 4 (four) times daily.     lisinopril 10 MG tablet  Commonly known as:  PRINIVIL,ZESTRIL  Take 10 mg by mouth daily.     methylPREDNISolone 4 MG tablet  Commonly known as:  MEDROL (PAK)  follow package directions     neomycin-polymyxin-hydrocortisone otic solution  Commonly known as:  CORTISPORIN  Place 3 drops into both ears daily as needed. Earaches     potassium chloride SA 20 MEQ tablet  Commonly known as:   K-DUR,KLOR-CON  Take 20 mEq by mouth daily.     Rivaroxaban 20 MG Tabs  Commonly known as:  XARELTO  Take 1 tablet (20 mg total) by mouth daily with supper.     vitamin C 1000 MG tablet  Take 1,000 mg by mouth daily.        Discharged Condition: Improved    Consults: Cardiology/general surgery  Significant Diagnostic Studies: Ct Abdomen Pelvis Wo Contrast  04/23/2013  *RADIOLOGY REPORT*  Clinical Data: Small bowel obstruction  CT ABDOMEN AND PELVIS WITHOUT CONTRAST  Technique:  Multidetector CT imaging of the abdomen and pelvis was performed following the standard protocol without intravenous contrast. Oral contrast was given.  Comparison: September 22, 2010.  Findings: Visualized lung bases appear normal.  Status post cholecystectomy.  The liver, spleen and pancreas appear normal. Bilateral renal cysts are noted which are unchanged compared to prior exam.  Adrenal glands are normal.  No hydronephrosis or renal obstruction is noted.  No renal or ureteral calculi are noted. Appendix appears normal.  Sigmoid diverticulosis is noted without inflammation.  Urinary bladder appears normal.  Mild prostatic hypertrophy is noted.  Moderate proximal small bowel dilatation is noted with transition zone seen on image number 45 of series 2. The distal small bowel  appears normal in caliber.  No abnormal fluid collection is noted. Small fat containing periumbilical hernia is noted which is stable compared to prior exam.  IMPRESSION: Sigmoid diverticulosis is noted with inflammation.  Old bilateral renal cysts are noted.  Proximal small bowel dilatation is noted with transition zone seen in the central portion of the abdomen. This is concerning for a partial small bowel obstruction potentially due to adhesion.   Original Report Authenticated By: Lupita Raider.,  M.D.    Dg Chest 2 View  04/19/2013  *RADIOLOGY REPORT*  Clinical Data: Shortness of breath, dizziness  CHEST - 2 VIEW  Comparison: 02/03/2012   Findings: Chronic interstitial markings.  No focal consolidation. No pleural effusion or pneumothorax.  The heart is top normal in size.  Prosthetic aortic valve.  Degenerative changes of the visualized thoracolumbar spine.  IMPRESSION: No evidence of acute cardiopulmonary disease.   Original Report Authenticated By: Charline Bills, M.D.    Dg Abd 1 View  04/22/2013  *RADIOLOGY REPORT*  Clinical Data: 77 year old male with abdominal pain and swelling.  ABDOMEN - 1 VIEW  Comparison: 12/12/2011 and earlier.  Findings: Portable supine views of the abdomen and pelvis.  There are gas filled small bowel loops measuring up to 53 mm diameter. Some colonic gas is present.  Grossly stable lung bases.  No definite pneumoperitoneum. No acute osseous abnormality identified.  IMPRESSION: Abnormal gas pattern compatible with early high-grade versus partial small bowel obstruction.   Original Report Authenticated By: Erskine Speed, M.D.    Dg Chest Port 1 View  04/21/2013  *RADIOLOGY REPORT*  Clinical Data: CHF, history COPD, hypertension, diabetes  PORTABLE CHEST - 1 VIEW  Comparison: Portable exam 1036 hours compared to 04/19/2013  Findings: Enlargement of cardiac silhouette post median sternotomy. Mediastinal contours and pulmonary vascularity normal. Lordotic positioning with underlying emphysematous changes noted. No definite acute infiltrate, pleural effusion, or pneumothorax. No acute osseous findings.  IMPRESSION: Enlargement of cardiac silhouette. Emphysematous changes consistent with COPD. No acute infiltrate.   Original Report Authenticated By: Ulyses Southward, M.D.     Lab Results: Basic Metabolic Panel:  Recent Labs  16/10/96 0537  NA 142  K 4.2  CL 99  CO2 37*  GLUCOSE 181*  BUN 65*  CREATININE 1.46*  CALCIUM 8.7   Liver Function Tests: No results found for this basename: AST, ALT, ALKPHOS, BILITOT, PROT, ALBUMIN,  in the last 72 hours   CBC: No results found for this basename: WBC, NEUTROABS,  HGB, HCT, MCV, PLT,  in the last 72 hours  Recent Results (from the past 240 hour(s))  MRSA PCR SCREENING     Status: None   Collection Time    04/19/13  3:08 PM      Result Value Range Status   MRSA by PCR NEGATIVE  NEGATIVE Final   Comment:            The GeneXpert MRSA Assay (FDA     approved for NASAL specimens     only), is one component of a     comprehensive MRSA colonization     surveillance program. It is not     intended to diagnose MRSA     infection nor to guide or     monitor treatment for     MRSA infections.     Hospital Course: He was admitted with COPD and was found to be in atrial flutter/fibrillation with rapid ventricular response is well. He was admitted to step down and started  on Cardizem. His heart rate was somewhat difficult to control. He was given IV antibiotics and IV steroids and his breathing improved. Cardiology consultation was obtained and he was continued on Cardizem and switched to by mouth. It was felt that he was going to need anti-coagulation long-term. His blood sugar was up so he was treated with insulin. He was improving and approaching being ready for discharge when he developed nausea and vomiting and was found to have a small bowel obstruction. He has had previous episodes of bowel obstruction. This was treated conservatively with nasogastric tube drainage and eventually resolved without surgical intervention. He was placed on oral medications his diet was advanced he was able to have a bowel movement and by the time of discharge he was able to eat his breathing was better his heart rate was controlled and he was able to ambulate in the hall  Discharge Exam: Blood pressure 124/58, pulse 73, temperature 97.9 F (36.6 C), temperature source Oral, resp. rate 17, height 5\' 6"  (1.676 m), weight 81.7 kg (180 lb 1.9 oz), SpO2 97.00%. He is awake and alert. He is probably still in atrial flutter but his heart rate is more regular. His blood sugars running  in the 100s. His chest is much clearer.  Disposition: Discharge home with home health services      Discharge Orders   Future Orders Complete By Expires     Discharge patient  As directed     Face-to-face encounter (required for Medicare/Medicaid patients)  As directed     Comments:      I Fina Heizer L certify that this patient is under my care and that I, or a nurse practitioner or physician's assistant working with me, had a face-to-face encounter that meets the physician face-to-face encounter requirements with this patient on 04/29/2013. The encounter with the patient was in whole, or in part for the following medical condition(s) which is the primary reason for home health care (List medical condition): copd/ atrial fib/small bowel obstruction    Questions:      The encounter with the patient was in whole, or in part, for the following medical condition, which is the primary reason for home health care:  copd/ atrial fib/ small bowel obstruction    I certify that, based on my findings, the following services are medically necessary home health services:  Nursing    Physical therapy    My clinical findings support the need for the above services:  Shortness of breath with activity    Further, I certify that my clinical findings support that this patient is homebound due to:  Shortness of Breath with activity    Reason for Medically Necessary Home Health Services:  Skilled Nursing- Change/Decline in Patient Status    Home Health  As directed     Questions:      To provide the following care/treatments:  PT    RN         Signed: Fredirick Maudlin Pager [phone]  04/29/2013, 8:13 AM

## 2013-05-04 ENCOUNTER — Inpatient Hospital Stay (HOSPITAL_COMMUNITY)
Admission: EM | Admit: 2013-05-04 | Discharge: 2013-05-26 | DRG: 871 | Disposition: A | Discharge status: Skilled Nursing Facility | Payer: Medicare Other | Attending: Pulmonary Disease | Admitting: Pulmonary Disease

## 2013-05-04 ENCOUNTER — Encounter (HOSPITAL_COMMUNITY): Payer: Self-pay | Admitting: *Deleted

## 2013-05-04 ENCOUNTER — Emergency Department (HOSPITAL_COMMUNITY): Payer: Medicare Other

## 2013-05-04 DIAGNOSIS — I2789 Other specified pulmonary heart diseases: Secondary | ICD-10-CM | POA: Diagnosis present

## 2013-05-04 DIAGNOSIS — A419 Sepsis, unspecified organism: Principal | ICD-10-CM | POA: Diagnosis present

## 2013-05-04 DIAGNOSIS — R040 Epistaxis: Secondary | ICD-10-CM | POA: Diagnosis present

## 2013-05-04 DIAGNOSIS — R918 Other nonspecific abnormal finding of lung field: Secondary | ICD-10-CM

## 2013-05-04 DIAGNOSIS — E8809 Other disorders of plasma-protein metabolism, not elsewhere classified: Secondary | ICD-10-CM | POA: Diagnosis present

## 2013-05-04 DIAGNOSIS — Z794 Long term (current) use of insulin: Secondary | ICD-10-CM

## 2013-05-04 DIAGNOSIS — I509 Heart failure, unspecified: Secondary | ICD-10-CM

## 2013-05-04 DIAGNOSIS — Z7982 Long term (current) use of aspirin: Secondary | ICD-10-CM

## 2013-05-04 DIAGNOSIS — K5900 Constipation, unspecified: Secondary | ICD-10-CM | POA: Diagnosis not present

## 2013-05-04 DIAGNOSIS — Z961 Presence of intraocular lens: Secondary | ICD-10-CM

## 2013-05-04 DIAGNOSIS — Z8249 Family history of ischemic heart disease and other diseases of the circulatory system: Secondary | ICD-10-CM

## 2013-05-04 DIAGNOSIS — D62 Acute posthemorrhagic anemia: Secondary | ICD-10-CM

## 2013-05-04 DIAGNOSIS — T50995A Adverse effect of other drugs, medicaments and biological substances, initial encounter: Secondary | ICD-10-CM | POA: Diagnosis present

## 2013-05-04 DIAGNOSIS — Z9849 Cataract extraction status, unspecified eye: Secondary | ICD-10-CM

## 2013-05-04 DIAGNOSIS — J449 Chronic obstructive pulmonary disease, unspecified: Secondary | ICD-10-CM

## 2013-05-04 DIAGNOSIS — I4892 Unspecified atrial flutter: Secondary | ICD-10-CM

## 2013-05-04 DIAGNOSIS — Z87891 Personal history of nicotine dependence: Secondary | ICD-10-CM

## 2013-05-04 DIAGNOSIS — IMO0002 Reserved for concepts with insufficient information to code with codable children: Secondary | ICD-10-CM

## 2013-05-04 DIAGNOSIS — J189 Pneumonia, unspecified organism: Secondary | ICD-10-CM

## 2013-05-04 DIAGNOSIS — J9601 Acute respiratory failure with hypoxia: Secondary | ICD-10-CM

## 2013-05-04 DIAGNOSIS — E875 Hyperkalemia: Secondary | ICD-10-CM | POA: Diagnosis present

## 2013-05-04 DIAGNOSIS — IMO0001 Reserved for inherently not codable concepts without codable children: Secondary | ICD-10-CM | POA: Diagnosis present

## 2013-05-04 DIAGNOSIS — D6489 Other specified anemias: Secondary | ICD-10-CM | POA: Diagnosis present

## 2013-05-04 DIAGNOSIS — B372 Candidiasis of skin and nail: Secondary | ICD-10-CM | POA: Diagnosis present

## 2013-05-04 DIAGNOSIS — N289 Disorder of kidney and ureter, unspecified: Secondary | ICD-10-CM

## 2013-05-04 DIAGNOSIS — I359 Nonrheumatic aortic valve disorder, unspecified: Secondary | ICD-10-CM

## 2013-05-04 DIAGNOSIS — N179 Acute kidney failure, unspecified: Secondary | ICD-10-CM | POA: Diagnosis present

## 2013-05-04 DIAGNOSIS — I2510 Atherosclerotic heart disease of native coronary artery without angina pectoris: Secondary | ICD-10-CM | POA: Diagnosis present

## 2013-05-04 DIAGNOSIS — M109 Gout, unspecified: Secondary | ICD-10-CM | POA: Diagnosis present

## 2013-05-04 DIAGNOSIS — R0902 Hypoxemia: Secondary | ICD-10-CM | POA: Diagnosis present

## 2013-05-04 DIAGNOSIS — R042 Hemoptysis: Secondary | ICD-10-CM

## 2013-05-04 DIAGNOSIS — D72823 Leukemoid reaction: Secondary | ICD-10-CM

## 2013-05-04 DIAGNOSIS — D649 Anemia, unspecified: Secondary | ICD-10-CM

## 2013-05-04 DIAGNOSIS — J9620 Acute and chronic respiratory failure, unspecified whether with hypoxia or hypercapnia: Secondary | ICD-10-CM

## 2013-05-04 DIAGNOSIS — I959 Hypotension, unspecified: Secondary | ICD-10-CM

## 2013-05-04 DIAGNOSIS — Z9861 Coronary angioplasty status: Secondary | ICD-10-CM

## 2013-05-04 DIAGNOSIS — R5381 Other malaise: Secondary | ICD-10-CM | POA: Diagnosis present

## 2013-05-04 DIAGNOSIS — M129 Arthropathy, unspecified: Secondary | ICD-10-CM | POA: Diagnosis present

## 2013-05-04 DIAGNOSIS — Z952 Presence of prosthetic heart valve: Secondary | ICD-10-CM

## 2013-05-04 DIAGNOSIS — J441 Chronic obstructive pulmonary disease with (acute) exacerbation: Secondary | ICD-10-CM

## 2013-05-04 DIAGNOSIS — N183 Chronic kidney disease, stage 3 unspecified: Secondary | ICD-10-CM

## 2013-05-04 DIAGNOSIS — Z79899 Other long term (current) drug therapy: Secondary | ICD-10-CM

## 2013-05-04 DIAGNOSIS — Z823 Family history of stroke: Secondary | ICD-10-CM

## 2013-05-04 DIAGNOSIS — N189 Chronic kidney disease, unspecified: Secondary | ICD-10-CM

## 2013-05-04 DIAGNOSIS — D696 Thrombocytopenia, unspecified: Secondary | ICD-10-CM

## 2013-05-04 DIAGNOSIS — I5033 Acute on chronic diastolic (congestive) heart failure: Secondary | ICD-10-CM

## 2013-05-04 DIAGNOSIS — Z8701 Personal history of pneumonia (recurrent): Secondary | ICD-10-CM

## 2013-05-04 DIAGNOSIS — I4891 Unspecified atrial fibrillation: Secondary | ICD-10-CM

## 2013-05-04 DIAGNOSIS — I129 Hypertensive chronic kidney disease with stage 1 through stage 4 chronic kidney disease, or unspecified chronic kidney disease: Secondary | ICD-10-CM | POA: Diagnosis present

## 2013-05-04 DIAGNOSIS — E46 Unspecified protein-calorie malnutrition: Secondary | ICD-10-CM | POA: Diagnosis present

## 2013-05-04 DIAGNOSIS — J90 Pleural effusion, not elsewhere classified: Secondary | ICD-10-CM | POA: Diagnosis present

## 2013-05-04 DIAGNOSIS — I9589 Other hypotension: Secondary | ICD-10-CM | POA: Diagnosis not present

## 2013-05-04 DIAGNOSIS — I5032 Chronic diastolic (congestive) heart failure: Secondary | ICD-10-CM

## 2013-05-04 DIAGNOSIS — E1165 Type 2 diabetes mellitus with hyperglycemia: Secondary | ICD-10-CM | POA: Diagnosis present

## 2013-05-04 LAB — CBC WITH DIFFERENTIAL/PLATELET
Eosinophils Absolute: 0 10*3/uL (ref 0.0–0.7)
Eosinophils Relative: 0 % (ref 0–5)
Lymphocytes Relative: 3 % — ABNORMAL LOW (ref 12–46)
Lymphs Abs: 1.3 10*3/uL (ref 0.7–4.0)
MCH: 31 pg (ref 26.0–34.0)
Myelocytes: 0 %
Neutro Abs: 34.6 10*3/uL — ABNORMAL HIGH (ref 1.7–7.7)
Neutrophils Relative %: 80 % — ABNORMAL HIGH (ref 43–77)
Platelets: 181 10*3/uL (ref 150–400)
Promyelocytes Absolute: 0 %
RBC: 2.97 MIL/uL — ABNORMAL LOW (ref 4.22–5.81)
WBC: 43.3 10*3/uL — ABNORMAL HIGH (ref 4.0–10.5)
nRBC: 0 /100 WBC

## 2013-05-04 LAB — COMPREHENSIVE METABOLIC PANEL
Alkaline Phosphatase: 71 U/L (ref 39–117)
BUN: 33 mg/dL — ABNORMAL HIGH (ref 6–23)
Chloride: 99 mEq/L (ref 96–112)
GFR calc Af Amer: 67 mL/min — ABNORMAL LOW (ref >90)
Glucose, Bld: 260 mg/dL — ABNORMAL HIGH (ref 70–99)
Potassium: 4.8 mEq/L (ref 3.5–5.1)
Total Bilirubin: 1.3 mg/dL — ABNORMAL HIGH (ref 0.3–1.2)

## 2013-05-04 LAB — TROPONIN I: Troponin I: <0.3 ng/mL (ref <0.30)

## 2013-05-04 LAB — GLUCOSE, CAPILLARY: Glucose-Capillary: 268 mg/dL — ABNORMAL HIGH (ref 70–99)

## 2013-05-04 LAB — PROCALCITONIN: Procalcitonin: 0.21 ng/mL

## 2013-05-04 MED ORDER — INSULIN ASPART 100 UNIT/ML ~~LOC~~ SOLN
0.0000 [IU] | Freq: Three times a day (TID) | SUBCUTANEOUS | Status: DC
Start: 2013-05-04 — End: 2013-05-09
  Administered 2013-05-04: 8 [IU] via SUBCUTANEOUS
  Administered 2013-05-05: 11 [IU] via SUBCUTANEOUS
  Administered 2013-05-05: 3 [IU] via SUBCUTANEOUS
  Administered 2013-05-05: 8 [IU] via SUBCUTANEOUS
  Administered 2013-05-06 (×2): 11 [IU] via SUBCUTANEOUS
  Administered 2013-05-06 – 2013-05-07 (×2): 8 [IU] via SUBCUTANEOUS
  Administered 2013-05-07: 11 [IU] via SUBCUTANEOUS
  Administered 2013-05-07: 5 [IU] via SUBCUTANEOUS
  Administered 2013-05-08: 11 [IU] via SUBCUTANEOUS
  Administered 2013-05-08: 5 [IU] via SUBCUTANEOUS
  Administered 2013-05-08: 11 [IU] via SUBCUTANEOUS
  Administered 2013-05-09: 15 [IU] via SUBCUTANEOUS
  Administered 2013-05-09: 3 [IU] via SUBCUTANEOUS
  Administered 2013-05-09: 15 [IU] via SUBCUTANEOUS

## 2013-05-04 MED ORDER — PREDNISONE 50 MG PO TABS
60.0000 mg | ORAL_TABLET | Freq: Every day | ORAL | Status: DC
  Administered 2013-05-04: 60 mg via ORAL
  Filled 2013-05-04 (×2): qty 1

## 2013-05-04 MED ORDER — IPRATROPIUM-ALBUTEROL 0.5-2.5 (3) MG/3ML IN SOLN: 3.0000 mL | Freq: Four times a day (QID) | RESPIRATORY_TRACT | Status: DC

## 2013-05-04 MED ORDER — PREDNISONE 20 MG PO TABS
40.0000 mg | ORAL_TABLET | Freq: Every day | ORAL | Status: DC
  Administered 2013-05-05 – 2013-05-06 (×2): 40 mg via ORAL
  Filled 2013-05-04 (×5): qty 2

## 2013-05-04 MED ORDER — IPRATROPIUM BROMIDE 0.02 % IN SOLN
RESPIRATORY_TRACT | Status: AC
  Administered 2013-05-04: 0.5 mg via RESPIRATORY_TRACT
  Filled 2013-05-04: qty 2.5

## 2013-05-04 MED ORDER — IPRATROPIUM BROMIDE 0.02 % IN SOLN
0.5000 mg | Freq: Once | RESPIRATORY_TRACT | Status: AC
  Administered 2013-05-04: 0.5 mg via RESPIRATORY_TRACT

## 2013-05-04 MED ORDER — PIPERACILLIN-TAZOBACTAM 3.375 G IVPB
3.3750 g | Freq: Four times a day (QID) | INTRAVENOUS | Status: DC
  Filled 2013-05-04 (×3): qty 50

## 2013-05-04 MED ORDER — ALLOPURINOL 300 MG PO TABS
300.0000 mg | ORAL_TABLET | Freq: Every day | ORAL | Status: DC
  Administered 2013-05-04 – 2013-05-08 (×5): 300 mg via ORAL
  Filled 2013-05-04 (×5): qty 1

## 2013-05-04 MED ORDER — VANCOMYCIN HCL IN DEXTROSE 750-5 MG/150ML-% IV SOLN
750.0000 mg | Freq: Two times a day (BID) | INTRAVENOUS | Status: DC
  Administered 2013-05-04 – 2013-05-06 (×5): 750 mg via INTRAVENOUS
  Filled 2013-05-04 (×9): qty 150

## 2013-05-04 MED ORDER — DILTIAZEM HCL ER 240 MG PO CP24
240.0000 mg | ORAL_CAPSULE | Freq: Every day | ORAL | Status: DC
  Administered 2013-05-04 – 2013-05-12 (×9): 240 mg via ORAL
  Filled 2013-05-04 (×9): qty 1

## 2013-05-04 MED ORDER — ALBUTEROL SULFATE (5 MG/ML) 0.5% IN NEBU
5.0000 mg | INHALATION_SOLUTION | Freq: Once | RESPIRATORY_TRACT | Status: AC
  Administered 2013-05-04: 5 mg via RESPIRATORY_TRACT
  Filled 2013-05-04: qty 1

## 2013-05-04 MED ORDER — IPRATROPIUM BROMIDE 0.02 % IN SOLN
0.5000 mg | Freq: Four times a day (QID) | RESPIRATORY_TRACT | Status: DC
  Administered 2013-05-04 – 2013-05-07 (×11): 0.5 mg via RESPIRATORY_TRACT
  Filled 2013-05-04 (×11): qty 2.5

## 2013-05-04 MED ORDER — PIPERACILLIN-TAZOBACTAM 3.375 G IVPB
3.3750 g | Freq: Once | INTRAVENOUS | Status: AC
  Administered 2013-05-04: 3.375 g via INTRAVENOUS
  Filled 2013-05-04 (×2): qty 50

## 2013-05-04 MED ORDER — FLUTICASONE PROPIONATE HFA 110 MCG/ACT IN AERO
1.0000 | INHALATION_SPRAY | Freq: Two times a day (BID) | RESPIRATORY_TRACT | Status: DC
Start: 2013-05-04 — End: 2013-05-04
  Administered 2013-05-04: 1 via RESPIRATORY_TRACT
  Filled 2013-05-04: qty 12

## 2013-05-04 MED ORDER — ALBUTEROL (5 MG/ML) CONTINUOUS INHALATION SOLN
INHALATION_SOLUTION | RESPIRATORY_TRACT | Status: AC
  Filled 2013-05-04: qty 20

## 2013-05-04 MED ORDER — DEXTROSE 5 % IV SOLN
1.0000 g | Freq: Three times a day (TID) | INTRAVENOUS | Status: DC
  Administered 2013-05-04: 1 g via INTRAVENOUS
  Filled 2013-05-04 (×3): qty 1

## 2013-05-04 MED ORDER — IPRATROPIUM BROMIDE 0.02 % IN SOLN: 0.5000 mg | Freq: Once | RESPIRATORY_TRACT | Status: AC

## 2013-05-04 MED ORDER — HYDROCOD POLST-CHLORPHEN POLST 10-8 MG/5ML PO LQCR: 5.0000 mL | Freq: Two times a day (BID) | ORAL | Status: DC | PRN

## 2013-05-04 MED ORDER — ALBUTEROL SULFATE (5 MG/ML) 0.5% IN NEBU: 2.5000 mg | INHALATION_SOLUTION | Freq: Once | RESPIRATORY_TRACT | Status: DC

## 2013-05-04 MED ORDER — SODIUM CHLORIDE 0.9 % IJ SOLN
3.0000 mL | Freq: Two times a day (BID) | INTRAMUSCULAR | Status: DC
  Administered 2013-05-04 – 2013-05-26 (×35): 3 mL via INTRAVENOUS

## 2013-05-04 MED ORDER — SODIUM CHLORIDE 0.9 % IV SOLN
Freq: Once | INTRAVENOUS | Status: AC
  Administered 2013-05-04: 10:00:00 via INTRAVENOUS

## 2013-05-04 MED ORDER — ALBUTEROL SULFATE (5 MG/ML) 0.5% IN NEBU: 2.5000 mg | INHALATION_SOLUTION | RESPIRATORY_TRACT | Status: AC | PRN

## 2013-05-04 MED ORDER — ALBUTEROL SULFATE (5 MG/ML) 0.5% IN NEBU: 5.0000 mg | INHALATION_SOLUTION | Freq: Once | RESPIRATORY_TRACT | Status: AC

## 2013-05-04 MED ORDER — FUROSEMIDE 10 MG/ML IJ SOLN
40.0000 mg | Freq: Once | INTRAMUSCULAR | Status: AC
  Administered 2013-05-04: 40 mg via INTRAVENOUS
  Filled 2013-05-04: qty 4

## 2013-05-04 MED ORDER — VANCOMYCIN HCL IN DEXTROSE 1-5 GM/200ML-% IV SOLN
1000.0000 mg | Freq: Once | INTRAVENOUS | Status: AC
  Administered 2013-05-04: 1000 mg via INTRAVENOUS
  Filled 2013-05-04: qty 200

## 2013-05-04 MED ORDER — ALBUTEROL SULFATE (5 MG/ML) 0.5% IN NEBU
2.5000 mg | INHALATION_SOLUTION | Freq: Four times a day (QID) | RESPIRATORY_TRACT | Status: DC
  Administered 2013-05-04 – 2013-05-07 (×11): 2.5 mg via RESPIRATORY_TRACT
  Filled 2013-05-04 (×12): qty 0.5

## 2013-05-04 MED ORDER — PIPERACILLIN-TAZOBACTAM 3.375 G IVPB
3.3750 g | Freq: Three times a day (TID) | INTRAVENOUS | Status: DC
  Administered 2013-05-04 – 2013-05-11 (×21): 3.375 g via INTRAVENOUS
  Filled 2013-05-04 (×27): qty 50

## 2013-05-04 MED ORDER — ALBUTEROL SULFATE (5 MG/ML) 0.5% IN NEBU
INHALATION_SOLUTION | RESPIRATORY_TRACT | Status: AC
  Administered 2013-05-04: 5 mg via RESPIRATORY_TRACT
  Filled 2013-05-04: qty 1

## 2013-05-04 NOTE — Progress Notes (Signed)
ANTIBIOTIC CONSULT NOTE - INITIAL  Pharmacy Consult for vancomycin Indication: rule out pneumonia  No Known Allergies  Patient Measurements: Height: 5\' 6"  (167.6 cm) Weight: 180 lb (81.647 kg) IBW/kg (Calculated) : 63.8  Vital Signs: Temp: 99 F (37.2 C) (05/05 1300) Temp src: Oral (05/05 1300) BP: 124/59 mmHg (05/05 1300) Pulse Rate: 130 (05/05 1300) Intake/Output from previous day:   Intake/Output from this shift: Total I/O In: -  Out: 1450 [Urine:1450]  Labs:  Recent Labs  05/04/13 0715  WBC 43.3*  HGB 9.2*  PLT 181  CREATININE 1.20   Estimated Creatinine Clearance: 54.2 ml/min (by C-G formula based on Cr of 1.2). No results found for this basename: VANCOTROUGH, Leodis Binet, VANCORANDOM, GENTTROUGH, GENTPEAK, GENTRANDOM, TOBRATROUGH, TOBRAPEAK, TOBRARND, AMIKACINPEAK, AMIKACINTROU, AMIKACIN,  in the last 72 hours   Microbiology: Recent Results (from the past 720 hour(s))  MRSA PCR SCREENING     Status: None   Collection Time    04/19/13  3:08 PM      Result Value Range Status   MRSA by PCR NEGATIVE  NEGATIVE Final   Comment:            The GeneXpert MRSA Assay (FDA     approved for NASAL specimens     only), is one component of a     comprehensive MRSA colonization     surveillance program. It is not     intended to diagnose MRSA     infection nor to guide or     monitor treatment for     MRSA infections.    Medical History: Past Medical History  Diagnosis Date  . COPD (chronic obstructive pulmonary disease)   . AF (atrial fibrillation)   . Hypertension   . Gout   . Arthritis   . Pneumonia     history of pneumonia  . Small bowel obstruction   . Renal insufficiency   . Aortic stenosis   . Pneumonia 10/12  . Hydropneumothorax   . Diabetes mellitus   . CHF (congestive heart failure)   . On home O2     qhs prn    Medications:  Prescriptions prior to admission  Medication Sig Dispense Refill  . acetaminophen (TYLENOL) 500 MG tablet Take  1,000 mg by mouth every 6 (six) hours as needed. Pain      . allopurinol (ZYLOPRIM) 300 MG tablet Take 300 mg by mouth daily.       . Ascorbic Acid (VITAMIN C) 1000 MG tablet Take 1,000 mg by mouth daily.      Marland Kitchen aspirin 81 MG tablet Take 81 mg by mouth daily.        Marland Kitchen diltiazem (DILACOR XR) 240 MG 24 hr capsule Take 240 mg by mouth daily.       . fish oil-omega-3 fatty acids 1000 MG capsule Take 1 g by mouth daily.      . fluticasone (FLOVENT HFA) 110 MCG/ACT inhaler Inhale 1 puff into the lungs 2 (two) times daily.      . furosemide (LASIX) 40 MG tablet Take 40 mg by mouth daily.        Marland Kitchen glyBURIDE (DIABETA) 2.5 MG tablet Take 2.5 mg by mouth daily.        Marland Kitchen ipratropium-albuterol (DUONEB) 0.5-2.5 (3) MG/3ML SOLN Take 3 mLs by nebulization 4 (four) times daily.      Marland Kitchen lisinopril (PRINIVIL,ZESTRIL) 10 MG tablet Take 10 mg by mouth daily.       . methylPREDNISolone (MEDROL, PAK,)  4 MG tablet follow package directions  21 tablet  0  . neomycin-polymyxin-hydrocortisone (CORTISPORIN) otic solution Place 3 drops into both ears daily as needed. Earaches      . potassium chloride SA (K-DUR,KLOR-CON) 20 MEQ tablet Take 20 mEq by mouth daily.        . rivaroxaban (XARELTO) 20 MG TABS Take 1 tablet (20 mg total) by mouth daily with supper.  30 tablet  12   Assessment: 77 year old man with recent hospitalization at [facility] in April now with hemoptysis.  Vancomycin and cefepime to start for 8 days for possible HCAP.  Goal of Therapy:  Vancomycin trough level 15-20 mcg/ml  Plan:  Measure antibiotic drug levels at steady state Follow up culture results Vancomycin 750mg  q12 and continue cefepime at 1g IV q8h  Mickeal Skinner 05/04/2013,2:17 PM

## 2013-05-04 NOTE — ED Provider Notes (Signed)
History    This chart was scribed for [name] Booze, MD by Leone Payor, ED Scribe. This patient was seen in room APA04/APA04 and the patient's care was started 7:02 AM.   CSN: [id number]  Arrival date & time 05/04/13  0652   None     Chief Complaint  Patient presents with  . Shortness of Breath     The history is provided by the patient. No language interpreter was used.    GAETAN [name] is a 77 y.o. male with a h/o of COPD who presents to the Emergency Department complaining of increased SOB and coughing up blood starting 1-2 days ago that worsened this morning.  He was admitted in the hospital for fluid on lungs and fibrillation 2 weeks ago and was discharged 4 days ago. Walking makes his breathing worse. Pt takes Xarelto daily and has a breathing machine that he uses regularly with relief. He has associated swelling in the legs. He denies chest pain, heaviness, tightness or pressure, coughing up phlegm, fever, chills, diaphoresis, nausea, vomiting, diarrhea, myalgias. Family member states pt's blood sugar has been in the 500's recently.    Pt is a former smoker but denies alcohol use.  Past Medical History  Diagnosis Date  . COPD (chronic obstructive pulmonary disease)   . AF (atrial fibrillation)   . Hypertension   . Gout   . Arthritis   . Pneumonia     history of pneumonia  . Small bowel obstruction   . Renal insufficiency   . Aortic stenosis   . Pneumonia 10/12  . Hydropneumothorax   . Diabetes mellitus   . CHF (congestive heart failure)   . On home O2     qhs prn    Past Surgical History  Procedure Laterality Date  . Rotator cuff repair      left and right  . Cardiac valve replacement      aortic valve with a tissue prosthesis and left sided maze proc  . S/p rewiring of sternum for dehiscence    . Gallbladder surgery    . US echocardiography  01-03-11    EF 55-60%  . Cardiovascular stress test  01-26-09    EF 67%  . Coronary angioplasty    . Cholecystectomy     . Sternal incision reclosure  08/29/2010    sternal rewiring  . Cataract extraction w/phaco  10/02/2012    Procedure: CATARACT EXTRACTION PHACO AND INTRAOCULAR LENS PLACEMENT (IOC);  Surgeon: Gemma Payor, MD;  Location: [facility];  Service: Ophthalmology;  Laterality: Right;  CDE 16.68  . Cataract extraction w/phaco  10/27/2012    Procedure: CATARACT EXTRACTION PHACO AND INTRAOCULAR LENS PLACEMENT (IOC);  Surgeon: Gemma Payor, MD;  Location: [facility];  Service: Ophthalmology;  Laterality: Left;  CDE:16.89  . Artificial aortic valve      Family History  Problem Relation Age of Onset  . Hypertension Mother   . Stroke Mother   . Heart disease Sister   . Kidney disease Sister     History  Substance Use Topics  . Smoking status: Former Smoker    Types: Cigarettes    Quit date: 01/18/2005  . Smokeless tobacco: Not on file  . Alcohol Use: No      Review of Systems  Constitutional: Negative for fever, chills and diaphoresis.  Respiratory: Positive for cough and shortness of breath. Negative for chest tightness.   Cardiovascular: Positive for leg swelling. Negative for chest pain and palpitations.  Gastrointestinal: Negative for  nausea, vomiting and diarrhea.  Musculoskeletal: Negative for myalgias.  All other systems reviewed and are negative.    Allergies  Review of patient's allergies indicates no known allergies.  Home Medications   Current Outpatient Rx  Name  Route  Sig  Dispense  Refill  . acetaminophen (TYLENOL) 500 MG tablet   Oral   Take 1,000 mg by mouth every 6 (six) hours as needed. Pain         . allopurinol (ZYLOPRIM) 300 MG tablet   Oral   Take 300 mg by mouth daily.          . Ascorbic Acid (VITAMIN C) 1000 MG tablet   Oral   Take 1,000 mg by mouth daily.         Marland Kitchen aspirin 81 MG tablet   Oral   Take 81 mg by mouth daily.           Marland Kitchen diltiazem (DILACOR XR) 240 MG 24 hr capsule   Oral   Take 240 mg by mouth daily.          . fish oil-omega-3  fatty acids 1000 MG capsule   Oral   Take 1 g by mouth daily.         . fluticasone (FLOVENT HFA) 110 MCG/ACT inhaler   Inhalation   Inhale 1 puff into the lungs 2 (two) times daily.         . furosemide (LASIX) 40 MG tablet   Oral   Take 40 mg by mouth daily.           Marland Kitchen glyBURIDE (DIABETA) 2.5 MG tablet   Oral   Take 2.5 mg by mouth daily.           Marland Kitchen ipratropium-albuterol (DUONEB) 0.5-2.5 (3) MG/3ML SOLN   Nebulization   Take 3 mLs by nebulization 4 (four) times daily.         Marland Kitchen lisinopril (PRINIVIL,ZESTRIL) 10 MG tablet   Oral   Take 10 mg by mouth daily.          . methylPREDNISolone (MEDROL, PAK,) 4 MG tablet      follow package directions   21 tablet   0   . neomycin-polymyxin-hydrocortisone (CORTISPORIN) otic solution   Both Ears   Place 3 drops into both ears daily as needed. Earaches         . potassium chloride SA (K-DUR,KLOR-CON) 20 MEQ tablet   Oral   Take 20 mEq by mouth daily.           . rivaroxaban (XARELTO) 20 MG TABS   Oral   Take 1 tablet (20 mg total) by mouth daily with supper.   30 tablet   12     There were no vitals taken for this visit.  Physical Exam  Nursing note and vitals reviewed. Constitutional: He is oriented to person, place, and time. He appears well-developed and well-nourished. No distress.  HENT:  Head: Normocephalic and atraumatic.  Eyes: EOM are normal.  Neck: Neck supple. No JVD present. No tracheal deviation present.  No JVD, neck or cardiovascular.   Cardiovascular: Normal rate, regular rhythm and normal heart sounds.   Pulmonary/Chest: Effort normal. No respiratory distress. He has decreased breath sounds. He has wheezes. He has rales.  Decreased breath sounds. Bibasilar rales. Diffuse expiratory wheezes.  Abdominal: He exhibits distension.  Slight distended. Umbilical hernia, easily reducible.   Musculoskeletal: Normal range of motion. He exhibits edema.  2+ pre tibial edema. 1+ presacral  edema   Neurological: He is alert and oriented to person, place, and time.  Skin: Skin is warm and dry.  Psychiatric: He has a normal mood and affect. His behavior is normal.    ED Course  Procedures (including critical care time)  DIAGNOSTIC STUDIES: Oxygen Saturation is 93% on room air, adequate by my interpretation.    COORDINATION OF CARE: 7:30 AM Discussed treatment plan with pt at bedside and pt agreed to plan.   Results for orders placed during the hospital encounter of 05/04/13  CBC WITH DIFFERENTIAL      Result Value Range   WBC 43.3 (*) 4.0 - 10.5 K/uL   RBC 2.97 (*) 4.22 - 5.81 MIL/uL   Hemoglobin 9.2 (*) 13.0 - 17.0 g/dL   HCT 16.1 (*) 09.6 - 04.5 %   MCV 92.9  78.0 - 100.0 fL   MCH 31.0  26.0 - 34.0 pg   MCHC 33.3  30.0 - 36.0 g/dL   RDW 40.9 (*) 81.1 - 91.4 %   Platelets 181  150 - 400 K/uL   Neutrophils Relative 80 (*) 43 - 77 %   Lymphocytes Relative 3 (*) 12 - 46 %   Monocytes Relative 17 (*) 3 - 12 %   Eosinophils Relative 0  0 - 5 %   Basophils Relative 0  0 - 1 %   Band Neutrophils 0  0 - 10 %   Metamyelocytes Relative 0     Myelocytes 0     Promyelocytes Absolute 0     Blasts 0     nRBC 0  0 /100 WBC   Neutro Abs 34.6 (*) 1.7 - 7.7 K/uL   Lymphs Abs 1.3  0.7 - 4.0 K/uL   Monocytes Absolute 7.4 (*) 0.1 - 1.0 K/uL   Eosinophils Absolute 0.0  0.0 - 0.7 K/uL   Basophils Absolute 0.0  0.0 - 0.1 K/uL   WBC Morphology HYPERSEGMENTED NEUT    COMPREHENSIVE METABOLIC PANEL      Result Value Range   Sodium 136  135 - 145 mEq/L   Potassium 4.8  3.5 - 5.1 mEq/L   Chloride 99  96 - 112 mEq/L   CO2 31  19 - 32 mEq/L   Glucose, Bld 260 (*) 70 - 99 mg/dL   BUN 33 (*) 6 - 23 mg/dL   Creatinine, Ser 7.82  0.50 - 1.35 mg/dL   Calcium 8.6  8.4 - 95.6 mg/dL   Total Protein 7.2  6.0 - 8.3 g/dL   Albumin 2.8 (*) 3.5 - 5.2 g/dL   AST 13  0 - 37 U/L   ALT 24  0 - 53 U/L   Alkaline Phosphatase 71  39 - 117 U/L   Total Bilirubin 1.3 (*) 0.3 - 1.2 mg/dL   GFR calc non Af  Amer 58 (*) >90 mL/min   GFR calc Af Amer 67 (*) >90 mL/min  TROPONIN I      Result Value Range   Troponin I <0.30  <0.30 ng/mL  PRO B NATRIURETIC PEPTIDE      Result Value Range   Pro B Natriuretic peptide (BNP) 2471.0 (*) 0 - 125 pg/mL    Dg Chest Portable 1 View  05/04/2013  *RADIOLOGY REPORT*  Clinical Data: Hemoptysis.  Coughing up blood.  Recent removal of tube from the throat.  Short of breath.  PORTABLE CHEST - 1 VIEW  Comparison: 04/21/2013.  Findings: Cardiomegaly and median sternotomy.  Bioprosthetic aortic valve replacement.  Left greater than right coursed airspace opacification is present radiating from the hilum to the bases. The distribution suggests aspiration.  In a patient with hemoptysis, vasculitic pulmonary hemorrhage is a consideration although the pattern is usually more fine in appearance.  IMPRESSION:  1.  Left greater than right basilar airspace disease most compatible with aspiration.  Pneumonia, pulmonary hemorrhage and edema are considered less likely. 2.  Median sternotomy and aortic valve replacement.   Original Report Authenticated By: Andreas Newport, M.D.    Images viewed by me.   Date: 05/04/2013  Rate: 90  Rhythm: atrial fibrillation  QRS Axis: normal  Intervals: normal  ST/T Wave abnormalities: nonspecific ST/T changes  Conduction Disutrbances:none  Narrative Interpretation: Atrial fibrillation with controlled ventricular response, nonspecific ST-T changes. When compared with ECG of 04/21/2013, no significant changes are seen.  Old EKG Reviewed: unchanged    1. Hemoptysis   2. COPD exacerbation   3. CHF (congestive heart failure)   4. Anemia   5. Atrial fibrillation   6. Leukemoid reaction       MDM  Dyspnea and hemoptysis and patient is anticoagulated. This appears to be a combination of COPD exacerbation and CHF. I am concerned about the hemoptysis given that the patient is taking Rivaroxaban. Old records are reviewed and he had just been  discharged from the hospital a few days ago following a small bowel obstruction and COPD and CHF exacerbation. He apparently was started on rivatoxaban while in the hospital. Case was discussed with Dr. Lendell Caprice of [facility] and it was felt that the patient needed the backup services at the [facility] in case is hemoptysis got significantly worse. Have contacted his PCP, Dr. [name] to explain what is happening Dr. [name] is also in agreement that the patient should be transferred. And discuss case Dr. Sung Amabile of pulmonary critical care was aware the patient be transferred. Note is made of 2 g drop in hemoglobin and marked elevation in WBCs. WBC elevation is felt to be a leukemoid reaction.  Case is discussed with Dr. Stacie Acres of [facility] who is concerned about possibility of pneumonia and requested patient be started on antibiotics of vancomycin and Zosyn her started to treat possible healthcare associated pneumonia. He is transferred to [facility].   I personally performed the services described in this documentation, which was scribed in my presence. The recorded information has been reviewed and is accurate.     [name] Booze, MD 05/04/13 [phone]

## 2013-05-04 NOTE — Consult Note (Addendum)
PULMONARY  / CRITICAL CARE MEDICINE  Name: [name] [name] MRN: [id number] DOB: 11/29/1936    ADMISSION DATE:  05/04/2013 CONSULTATION DATE:  05/04/2013  REFERRING MD :  Mahala Menghini PRIMARY SERVICE:  Hospitalist  CHIEF COMPLAINT:  hemoptysis  BRIEF PATIENT DESCRIPTION: 77 yo WM with hx of severe COPD, CHF (LVEF 55% to 60% on 4/21), and atrial flutter with recent hospitalization from 4/21-4/30 that is on Xarelto presents with hemoptysis and increasing sob X 1 day,  PCCM consulted for hemoptysis  SIGNIFICANT EVENTS / STUDIES:  5/04 - admitted with hemoptysis   LINES / TUBES: None at this time  CULTURES: None at this time  ANTIBIOTICS: 5/5 Cefepime>>>5/5 5/5 Vanco>>> 5/5 Zoysn>>>  HISTORY OF PRESENT ILLNESS:  77 yo WM with hx of severe COPD (PFTs from 05/2012 FEV1/FVC 41, 56% of predicted) , CHF (LVEF 55% to 60% on 4/21), and atrial flutter that is on Xarelto presents with hemoptysis  and increasing sob onset 1 day ago described as coughing up a handful of bloody sputum. Patient was recently hospitalized from 4/21-4/30  for afib, CHF, and COPD exacerbation. Patient was started on his Xarelto at the end of last hospital stay. Pt states that at baseline he is able to walk from car to grocery store without getting short of breath but now he is short of breath with any activity. Patient is on 2L of oxygen at home at night. Patient denies any recent foreign travel, weight loss, and night sweats. Patient denies recent cold symptoms and other sick contacts.  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  He was never in the [employer].  He denies any animal exposures.  PAST MEDICAL HISTORY :  Past Medical History  Diagnosis Date  . COPD (chronic obstructive pulmonary disease)   . AF (atrial fibrillation)   . Hypertension   . Gout   . Arthritis   . Pneumonia     history of pneumonia  . Small bowel obstruction   . Renal insufficiency   . Aortic  stenosis   . Pneumonia 10/12  . Hydropneumothorax   . Diabetes mellitus   . CHF (congestive heart failure)   . On home O2     qhs prn   Past Surgical History  Procedure Laterality Date  . Rotator cuff repair      left and right  . Cardiac valve replacement      aortic valve with a tissue prosthesis and left sided maze proc  . S/p rewiring of sternum for dehiscence    . Gallbladder surgery    . US echocardiography  01-03-11    EF 55-60%  . Cardiovascular stress test  01-26-09    EF 67%  . Coronary angioplasty    . Cholecystectomy    . Sternal incision reclosure  08/29/2010    sternal rewiring  . Cataract extraction w/phaco  10/02/2012    Procedure: CATARACT EXTRACTION PHACO AND INTRAOCULAR LENS PLACEMENT (IOC);  Surgeon: Gemma Payor, MD;  Location: [facility];  Service: Ophthalmology;  Laterality: Right;  CDE 16.68  . Cataract extraction w/phaco  10/27/2012    Procedure: CATARACT EXTRACTION PHACO AND INTRAOCULAR LENS PLACEMENT (IOC);  Surgeon: Gemma Payor, MD;  Location: [facility];  Service: Ophthalmology;  Laterality: Left;  CDE:16.89  . Artificial aortic valve     Prior to Admission medications   Medication Sig Start Date End Date Taking? Authorizing Provider  acetaminophen (TYLENOL) 500 MG tablet Take 1,000  mg by mouth every 6 (six) hours as needed. Pain   Yes Historical Provider, MD  allopurinol (ZYLOPRIM) 300 MG tablet Take 300 mg by mouth daily.    Yes Historical Provider, MD  Ascorbic Acid (VITAMIN C) 1000 MG tablet Take 1,000 mg by mouth daily.   Yes Historical Provider, MD  aspirin 81 MG tablet Take 81 mg by mouth daily.     Yes Historical Provider, MD  diltiazem (DILACOR XR) 240 MG 24 hr capsule Take 240 mg by mouth daily.    Yes Historical Provider, MD  fish oil-omega-3 fatty acids 1000 MG capsule Take 1 g by mouth daily.   Yes Historical Provider, MD  fluticasone (FLOVENT HFA) 110 MCG/ACT inhaler Inhale 1 puff into the lungs 2 (two) times daily.   Yes Historical Provider, MD   furosemide (LASIX) 40 MG tablet Take 40 mg by mouth daily.     Yes Historical Provider, MD  glyBURIDE (DIABETA) 2.5 MG tablet Take 2.5 mg by mouth daily.     Yes Historical Provider, MD  ipratropium-albuterol (DUONEB) 0.5-2.5 (3) MG/3ML SOLN Take 3 mLs by nebulization 4 (four) times daily.   Yes Historical Provider, MD  lisinopril (PRINIVIL,ZESTRIL) 10 MG tablet Take 10 mg by mouth daily.    Yes Historical Provider, MD  methylPREDNISolone (MEDROL, PAK,) 4 MG tablet follow package directions 04/29/13  Yes Fredirick Maudlin, MD  neomycin-polymyxin-hydrocortisone (CORTISPORIN) otic solution Place 3 drops into both ears daily as needed. Earaches   Yes Historical Provider, MD  potassium chloride SA (K-DUR,KLOR-CON) 20 MEQ tablet Take 20 mEq by mouth daily.     Yes Historical Provider, MD  rivaroxaban (XARELTO) 20 MG TABS Take 1 tablet (20 mg total) by mouth daily with supper. 04/29/13  Yes Fredirick Maudlin, MD   No Known Allergies  FAMILY HISTORY:  Family History  Problem Relation Age of Onset  . Hypertension Mother   . Stroke Mother   . Heart disease Sister   . Kidney disease Sister    SOCIAL HISTORY: Patient reports smoking for 40 years about a pack a day.   REVIEW OF SYSTEMS:   Constitutional: Negative for fever, chills, weight loss, malaise/fatigue and diaphoresis.  HENT: Negative for hearing loss, ear pain, nosebleeds, congestion, sore throat, neck pain, tinnitus and ear discharge.   Eyes: Negative for blurred vision, double vision, photophobia, pain, discharge and redness.  Respiratory: Positive for cough, hemoptysis, sputum production, sob, and wheezing. Negative stridor.   Cardiovascular: Positive for leg swelling. Negative for chest pain, palpitations, orthopnea, claudication, and PND.  Gastrointestinal: Negative for heartburn, nausea, vomiting, abdominal pain, diarrhea, constipation, blood in stool and melena.  Genitourinary: Negative for dysuria, urgency, frequency, hematuria and  flank pain.  Musculoskeletal: Negative for myalgias, back pain, joint pain and falls.  Skin: Negative for itching and rash.  Neurological: Negative for dizziness, tingling, tremors, sensory change, speech change, focal weakness, seizures, loss of consciousness, weakness and headaches.  Endo/Heme/Allergies: Negative for environmental allergies and polydipsia. Does not bruise/bleed easily.  SUBJECTIVE: Patient complains of sob with any activity.   VITAL SIGNS: Temp:  [98.6 F (37 C)-99 F (37.2 C)] 99 F (37.2 C) (05/05 1300) Pulse Rate:  [78-130] 130 (05/05 1300) Resp:  [24-30] 27 (05/05 1300) BP: (118-143)/(52-79) 124/59 mmHg (05/05 1300) SpO2:  [80 %-96 %] 95 % (05/05 1300) FiO2 (%):  [4 %] 4 % (05/05 1300) Weight:  [180 lb (81.647 kg)] 180 lb (81.647 kg) (05/05 0709)  PHYSICAL EXAMINATION: General:  Alert and oriented  elderly white male in NAD Neuro:  No focal deficits HEENT:  PERRL Neck:  supple Cardiovascular:  Tachycardic in 100s irregular. No m/g/r Lungs:  Left lung crackles. Right lung expiratory wheezes throughout. No respiratory distress. No accessory muscle use.  Abdomen:  Soft, nontender to palpation Musculoskeletal: No clubbing, minimal ankle edema Skin:  Warm and dry   Recent Labs Lab 05/04/13 0715  NA 136  K 4.8  CL 99  CO2 31  BUN 33*  CREATININE 1.20  GLUCOSE 260*    Recent Labs Lab 05/04/13 0715  HGB 9.2*  HCT 27.6*  WBC 43.3*  PLT 181   Dg Chest Portable 1 View  05/04/2013  *RADIOLOGY REPORT*  Clinical Data: Hemoptysis.  Coughing up blood.  Recent removal of tube from the throat.  Short of breath.  PORTABLE CHEST - 1 VIEW  Comparison: 04/21/2013.  Findings: Cardiomegaly and median sternotomy.  Bioprosthetic aortic valve replacement.  Left greater than right coursed airspace opacification is present radiating from the hilum to the bases. The distribution suggests aspiration.  In a patient with hemoptysis, vasculitic pulmonary hemorrhage is a  consideration although the pattern is usually more fine in appearance.  IMPRESSION:  1.  Left greater than right basilar airspace disease most compatible with aspiration.  Pneumonia, pulmonary hemorrhage and edema are considered less likely. 2.  Median sternotomy and aortic valve replacement.   Original Report Authenticated By: Andreas Newport, M.D.     ASSESSMENT / PLAN:  PULMONARY A:  Hemoptysis in setting of HCAP. AECOPD with hx of severe COPD. Acute on chronic respiratory failure - baseline nocturnal O2 dep  P: -sputum culture -check procalcitonin -agree with zosyn and vancomycin pending culture results -oxygen to keep SpO2 > 92% -f/u CXR -continue albuterol/ipratropium -d/c flovent for now -wean off prednisone as tolerated >> change to 40 mg daily -no indication for bronchoscopy/CT chest at present -add cough suppressant prn  CARDIOLOGY A: A fib with RVR >> likely related to SIRS/Sepsis.  P: -hold xarelto, aspirin -monitor heart rhythm -keep even fluid balance -continue diltiazem  HEMATOLOGY A:  Anemia of acute illness. Leukocytosis likely from steroid use and SIRS/Sepsis.  P: -f/u CBC  Gery Pray, PA-S Anna Genre, PA-S  [name], NP-C Pulmonary and Critical Care Medicine [facility] Pager: [phone]  05/04/2013, 1:24 PM  Reviewed above, examined, and agree with assessment/plan.  He has hemoptysis in setting of probable HCAP after recently being started on xarelto.  Will hold ASA, xarelto.  Will continue Abx for HCAP pending culture results.  Defer bronchoscopy or CT chest imaging for now.  Updated family at bedside.  Coralyn Helling, MD [facility] Pulmonary/Critical Care 05/04/2013, 3:30 PM Pager:  [phone] After 3pm call: [phone]

## 2013-05-04 NOTE — ED Notes (Signed)
Report called to Inetta Fermo, RN on 2600 at [facility].

## 2013-05-04 NOTE — ED Notes (Signed)
Report called to [facility]. From [place],

## 2013-05-04 NOTE — ED Notes (Signed)
Increased sob and coughing up blood since this morning.  Denies pain.  Recently d/c from here for small bowel obstruction.

## 2013-05-04 NOTE — Progress Notes (Signed)
Disposition Note  [name] [name], is a 77 y.o. male,   MRN: [id number]  -  DOB - May 13, 1936  Outpatient Primary MD for the patient is HAWKINS,EDWARD L, MD   Blood pressure 135/63, pulse 109, temperature 99 F (37.2 C), temperature source Oral, resp. rate 27, height 5\' 6"  (1.676 m), weight 81.647 kg (180 lb), SpO2 94.00%.  Active Problems:   COPD (chronic obstructive pulmonary disease)   Hypertension   Arthritis   Acute exacerbation of chronic obstructive pulmonary disease (COPD)   Diabetes   Chronic diastolic congestive heart failure   PNA (pneumonia)   Acute blood loss anemia  77 y/o with PMH as above comes in for weakness and hemoptysis ( Streaks of blood with sputum) was found to have a Hbg 9.2 ( previously 11.8 on 4.26.2014) currently on xarelo for PAF, found to be in a. Flutter By EKG on 5.5.2014. CXR 5.5.2014 left greater than right infiltrate, very high leukocytosis of 43 ( currently on steroids), has not had a guiac started empirically in vanc and zosyn sat > 90% on 4 L ( currently on O2 at home ). Ask to be transfer to [facility] after the case with [facility] and pulmonary at [facility]. PCCM at [facility] was consulted and agreed with transfer.  I ask the ED Physician to perform a rectal exam on the patient before being transfer and type and screen the patient.

## 2013-05-04 NOTE — H&P (Signed)
. [name] History and Physical  [name] [name] ZOX:[id number] DOB: [birth year] DOA: 05/04/2013  Referring physician: Preston Fleeting ED-P PCP: Fredirick Maudlin, MD  Specialists:  Cardiology -[place] Pulmonology -[name]  Chief Complaint: Bleeding  HPI: [name] [name] is a 77 y.o. male presented from [facility] ED with hemoptysis.  He was hospitlized at [facility] from 4/21 to 4/30 with AECOPD and given IV Abx and Steroid sand was in Afib and then had an SBO as well non surgically managed  He was staretd on xarelto. He states the hemoptysis started yesterday about 14:00 hours and was noted to be about 2-3 tsp of blood-It wasn't mixed with sputum it was "just blood".  He initially though tit might have been 2/2 to the Ng tube that had been placed last week at [facility]-his wife reports some sputum and brownish mucous He had coughing of blood about 4-5 times today-no pain with this.  No vomiting of blood.   No SOB associated with this more than usual. No stomach pan.  No dark or tarry stool noted No fever or chills or rigors. No diarrhoea   Review of Systems: The patient denies co as per HPI  Past Medical History  Diagnosis Date  . COPD (chronic obstructive pulmonary disease)   . AF (atrial fibrillation)   . Hypertension   . Gout   . Arthritis   . Pneumonia     history of pneumonia  . Small bowel obstruction   . Renal insufficiency   . Aortic stenosis   . Pneumonia 10/12  . Hydropneumothorax   . Diabetes mellitus   . CHF (congestive heart failure)   . On home O2     qhs prn   Chart review Admission 04/19/13 for Rapid Afib, Bowel obstruction non-surgically managed-started on Xarelto Admission 10/12/11 for Pneumonia and Hydropneumothorax H/o Maze procedure, Replacement of Aortic vavle and ligation L appendage 11/02/2010-severe ileus Admission 02/27/09 for PNA Amdission 02/02/09 for complete rotator cuff tear    Past Surgical History  Procedure Laterality Date  . Rotator cuff repair      left  and right  . Cardiac valve replacement      aortic valve with a tissue prosthesis and left sided maze proc  . S/p rewiring of sternum for dehiscence    . Gallbladder surgery    . US echocardiography  01-03-11    EF 55-60%  . Cardiovascular stress test  01-26-09    EF 67%  . Coronary angioplasty    . Cholecystectomy    . Sternal incision reclosure  08/29/2010    sternal rewiring  . Cataract extraction w/phaco  10/02/2012    Procedure: CATARACT EXTRACTION PHACO AND INTRAOCULAR LENS PLACEMENT (IOC);  Surgeon: Gemma Payor, MD;  Location: [facility];  Service: Ophthalmology;  Laterality: Right;  CDE 16.68  . Cataract extraction w/phaco  10/27/2012    Procedure: CATARACT EXTRACTION PHACO AND INTRAOCULAR LENS PLACEMENT (IOC);  Surgeon: Gemma Payor, MD;  Location: [facility];  Service: Ophthalmology;  Laterality: Left;  CDE:16.89  . Artificial aortic valve     Social History:  reports that he quit smoking about 8 years ago. His smoking use included Cigarettes. He smoked 0.00 packs per day. He does not have any smokeless tobacco history on file. He reports that he does not drink alcohol or use illicit drugs.   No Known Allergies  Family History  Problem Relation Age of Onset  . Hypertension Mother   . Stroke Mother   . Heart disease Sister   .  Kidney disease Sister     Prior to Admission medications   Medication Sig Start Date End Date Taking? Authorizing Provider  acetaminophen (TYLENOL) 500 MG tablet Take 1,000 mg by mouth every 6 (six) hours as needed. Pain   Yes Historical Provider, MD  allopurinol (ZYLOPRIM) 300 MG tablet Take 300 mg by mouth daily.    Yes Historical Provider, MD  Ascorbic Acid (VITAMIN C) 1000 MG tablet Take 1,000 mg by mouth daily.   Yes Historical Provider, MD  aspirin 81 MG tablet Take 81 mg by mouth daily.     Yes Historical Provider, MD  diltiazem (DILACOR XR) 240 MG 24 hr capsule Take 240 mg by mouth daily.    Yes Historical Provider, MD  fish oil-omega-3 fatty acids 1000  MG capsule Take 1 g by mouth daily.   Yes Historical Provider, MD  fluticasone (FLOVENT HFA) 110 MCG/ACT inhaler Inhale 1 puff into the lungs 2 (two) times daily.   Yes Historical Provider, MD  furosemide (LASIX) 40 MG tablet Take 40 mg by mouth daily.     Yes Historical Provider, MD  glyBURIDE (DIABETA) 2.5 MG tablet Take 2.5 mg by mouth daily.     Yes Historical Provider, MD  ipratropium-albuterol (DUONEB) 0.5-2.5 (3) MG/3ML SOLN Take 3 mLs by nebulization 4 (four) times daily.   Yes Historical Provider, MD  lisinopril (PRINIVIL,ZESTRIL) 10 MG tablet Take 10 mg by mouth daily.    Yes Historical Provider, MD  methylPREDNISolone (MEDROL, PAK,) 4 MG tablet follow package directions 04/29/13  Yes Fredirick Maudlin, MD  neomycin-polymyxin-hydrocortisone (CORTISPORIN) otic solution Place 3 drops into both ears daily as needed. Earaches   Yes Historical Provider, MD  potassium chloride SA (K-DUR,KLOR-CON) 20 MEQ tablet Take 20 mEq by mouth daily.     Yes Historical Provider, MD  rivaroxaban (XARELTO) 20 MG TABS Take 1 tablet (20 mg total) by mouth daily with supper. 04/29/13  Yes Fredirick Maudlin, MD   Physical Exam: Filed Vitals:   05/04/13 0930 05/04/13 1000 05/04/13 1030 05/04/13 1100  BP: 135/63 143/79 129/74 128/63  Pulse: 109 94 102 96  Temp:      TempSrc:      Resp: 27 29 28 27   Height:      Weight:      SpO2: 94% 94% 96% 95%     General:  Alert pleasant oriented in mild respiratory distress  Eyes: No itching or pallor still audible with  ENT: Throat clear moderate dentition  Neck: Soft supple  Cardiovascular: S1-S2 in rapid A. fib.  Respiratory: Coarse breath sounds bilaterally no tactile vocal resonance or fremitus  Abdomen: Soft nontender nondistended, Hemoccult performed with moderate tone to anal sphincter no blood on glove. Stool seemed regular brown  Skin: No lower extremity edema is  Musculoskeletal: Range of motion to  Psychiatric: Euthymic  Neurologic: Grossly  intact  Labs on Admission:  Basic Metabolic Panel:  Recent Labs Lab 05/04/13 0715  NA 136  K 4.8  CL 99  CO2 31  GLUCOSE 260*  BUN 33*  CREATININE 1.20  CALCIUM 8.6   Liver Function Tests:  Recent Labs Lab 05/04/13 0715  AST 13  ALT 24  ALKPHOS 71  BILITOT 1.3*  PROT 7.2  ALBUMIN 2.8*   No results found for this basename: LIPASE, AMYLASE,  in the last 168 hours No results found for this basename: AMMONIA,  in the last 168 hours CBC:  Recent Labs Lab 05/04/13 0715  WBC 43.3*  NEUTROABS  34.6*  HGB 9.2*  HCT 27.6*  MCV 92.9  PLT 181   Cardiac Enzymes:  Recent Labs Lab 05/04/13 0715  TROPONINI <0.30    BNP (last 3 results)  Recent Labs  04/19/13 0950 05/04/13 0715  PROBNP 3127.0* 2471.0*   CBG:  Recent Labs Lab 04/28/13 1127 04/28/13 1637 04/29/13 0021 04/29/13 0614 04/29/13 0712  GLUCAP 228* 154* 158* 114* 106*    Radiological Exams on Admission: Dg Chest Portable 1 View  05/04/2013  *RADIOLOGY REPORT*  Clinical Data: Hemoptysis.  Coughing up blood.  Recent removal of tube from the throat.  Short of breath.  PORTABLE CHEST - 1 VIEW  Comparison: 04/21/2013.  Findings: Cardiomegaly and median sternotomy.  Bioprosthetic aortic valve replacement.  Left greater than right coursed airspace opacification is present radiating from the hilum to the bases. The distribution suggests aspiration.  In a patient with hemoptysis, vasculitic pulmonary hemorrhage is a consideration although the pattern is usually more fine in appearance.  IMPRESSION:  1.  Left greater than right basilar airspace disease most compatible with aspiration.  Pneumonia, pulmonary hemorrhage and edema are considered less likely. 2.  Median sternotomy and aortic valve replacement.   Original Report Authenticated By: Andreas Newport, M.D.     EKG: Independently reviewed. Atrial flutter probably 2:1 to 3:1 block with axis of about 70 some peak T waves however maybe rhythm related  to  Assessment/Plan Active Problems:   COPD (chronic obstructive pulmonary disease)   Hypertension   Arthritis   Acute exacerbation of chronic obstructive pulmonary disease (COPD)   Diabetes   Chronic diastolic congestive heart failure   PNA (pneumonia)   Acute blood loss anemia   1. Hemoptysis-and this seems to be exacerbated by coughing. Patient on Xarelto which can be held. At this point given he is hemodynamically stable and has not suffered from massive hemoptysis, would hold on FEIBA-may require helical CT versus bronchoscopy dependent on pulmonology input. I have paged Dr. Craige Cotta who is aware and will be seeing patient appreciate input in advance 2. Potential hospital-acquired pneumonia-patient started on vancomycin and Zosyn in the emergency room-will change to cefepime and vancomycin-atypicals and unusual in this patient population. 3. AECOPD-patient has been on Solu-Medrol from last hospital stay and is finishing up a taper. I would change him to prednisone 60 mg for 2 days-continue albuterol nebulizations every 4 when necessary and Atrovent every 6 hourly 4. Atrial fibrillation, Chad2Vasc score about 2-s/p MAze procedure 2011 with recurrence-Will continue his diltiazem 240 mg. If needed we will consult cardiology for recommendations for anticoagulation subsequent to this, suspect he will benefit from aspirin therapy on 14 days or so of resolution of bleeding 5. Status post aortic valve replacement with bioprosthetic device 2011-continue aspirin as above 6. Hypertension-seem that he is tolerating his hemoptysis fairly well pressures are in normal range he can continue his diltiazem 240 mg daily-if he becomes uncontrolled on is rate, he may need a Cardizem drip 7. Pulmonary hypertension based on Casimiro Needle 04/20/2013, EF 65% with no heart failure-per pulmonology   Pulmonaary   Code Status: Full  Family Communication: spoke with wife briefly in room  Disposition Plan:  Inpatient, SDU    Time spent: 16  Mahala Menghini [name] Pager [phone]  If 7PM-7AM, please contact night-coverage [email or web address] Password [facility] 05/04/2013, 12:42 PM

## 2013-05-05 ENCOUNTER — Inpatient Hospital Stay (HOSPITAL_COMMUNITY): Payer: Medicare Other

## 2013-05-05 DIAGNOSIS — J9601 Acute respiratory failure with hypoxia: Secondary | ICD-10-CM | POA: Insufficient documentation

## 2013-05-05 DIAGNOSIS — D696 Thrombocytopenia, unspecified: Secondary | ICD-10-CM | POA: Diagnosis not present

## 2013-05-05 DIAGNOSIS — J9600 Acute respiratory failure, unspecified whether with hypoxia or hypercapnia: Secondary | ICD-10-CM

## 2013-05-05 DIAGNOSIS — J449 Chronic obstructive pulmonary disease, unspecified: Secondary | ICD-10-CM

## 2013-05-05 DIAGNOSIS — R042 Hemoptysis: Secondary | ICD-10-CM | POA: Diagnosis present

## 2013-05-05 LAB — LEGIONELLA ANTIGEN, URINE: Legionella Antigen, Urine: NEGATIVE

## 2013-05-05 LAB — STREP PNEUMONIAE URINARY ANTIGEN: Strep Pneumo Urinary Antigen: NEGATIVE

## 2013-05-05 LAB — EXPECTORATED SPUTUM ASSESSMENT W GRAM STAIN, RFLX TO RESP C: Special Requests: NORMAL

## 2013-05-05 LAB — GLUCOSE, CAPILLARY: Glucose-Capillary: 313 mg/dL — ABNORMAL HIGH (ref 70–99)

## 2013-05-05 LAB — CBC
HCT: 24.5 % — ABNORMAL LOW (ref 39.0–52.0)
Hemoglobin: 8.3 g/dL — ABNORMAL LOW (ref 13.0–17.0)
MCV: 90.1 fL (ref 78.0–100.0)
Platelets: 135 10*3/uL — ABNORMAL LOW (ref 150–400)
RBC: 2.72 MIL/uL — ABNORMAL LOW (ref 4.22–5.81)
WBC: 29.9 10*3/uL — ABNORMAL HIGH (ref 4.0–10.5)

## 2013-05-05 LAB — COMPREHENSIVE METABOLIC PANEL
Albumin: 2.2 g/dL — ABNORMAL LOW (ref 3.5–5.2)
BUN: 36 mg/dL — ABNORMAL HIGH (ref 6–23)
Calcium: 8.4 mg/dL (ref 8.4–10.5)
Creatinine, Ser: 1.35 mg/dL (ref 0.50–1.35)
Total Protein: 6.5 g/dL (ref 6.0–8.3)

## 2013-05-05 NOTE — Consult Note (Signed)
PULMONARY  / CRITICAL CARE MEDICINE  Name: [name] [name] MRN: [id number] DOB: July 25, 1936    ADMISSION DATE:  05/04/2013 CONSULTATION DATE:  05/04/2013  REFERRING MD :  Mahala Menghini PRIMARY SERVICE:  Hospitalist  CHIEF COMPLAINT:  hemoptysis  BRIEF PATIENT DESCRIPTION: 77 yo WM with hx of severe COPD, CHF (LVEF 55% to 60% on 4/21), and atrial flutter with recent hospitalization from 4/21-4/30 that is on Xarelto presents with hemoptysis and increasing sob X 1 day,  PCCM consulted for hemoptysis  SIGNIFICANT EVENTS / STUDIES:  5/04 - admitted with hemoptysis   LINES / TUBES: None at this time  CULTURES: None at this time  ANTIBIOTICS: 5/5 Cefepime>>>5/5 5/5 Vanco>>> 5/5 Zoysn>>>  HISTORY OF PRESENT ILLNESS:  77 yo WM with hx of severe COPD (PFTs from 05/2012 FEV1/FVC 41, 56% of predicted) , CHF (LVEF 55% to 60% on 4/21), and atrial flutter that is on Xarelto presents with hemoptysis  and increasing sob onset 1 day ago described as coughing up a handful of bloody sputum. Patient was recently hospitalized from 4/21-4/30  for afib, CHF, and COPD exacerbation. Patient was started on his Xarelto at the end of last hospital stay. Pt states that at baseline he is able to walk from car to grocery store without getting short of breath but now he is short of breath with any activity. Patient is on 2L of oxygen at home at night. Patient denies any recent foreign travel, weight loss, and night sweats. Patient denies recent cold symptoms and other sick contacts.  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  He was never in the [employer].  He denies any animal exposures.  SUBJECTIVE: SOB when laying flat, decreased hemoptysis.   VITAL SIGNS: Temp:  [97.6 F (36.4 C)-99 F (37.2 C)] 98.2 F (36.8 C) (05/06 0800) Pulse Rate:  [42-130] 74 (05/06 0800) Resp:  [20-32] 23 (05/06 0800) BP: (108-127)/(51-64) 122/53 mmHg (05/06 1052) SpO2:  [93 %-96 %] 93 %  (05/06 1128) FiO2 (%):  [4 %] 4 % (05/05 1300) Weight:  [77.3 kg (170 lb 6.7 oz)-78.3 kg (172 lb 9.9 oz)] 77.3 kg (170 lb 6.7 oz) (05/06 0500)  PHYSICAL EXAMINATION: General:  Alert and oriented elderly white male in NAD Neuro:  No focal deficits HEENT:  PERRL Neck:  supple Cardiovascular:  Tachycardic in 100s irregular. No m/g/r Lungs:  Left lung crackles. Right lung expiratory wheezes throughout. No respiratory distress. No accessory muscle use.  Abdomen:  Soft, nontender to palpation Musculoskeletal: No clubbing, minimal ankle edema Skin:  Warm and dry  Recent Labs Lab 05/04/13 0715 05/05/13 0500  NA 136 134*  K 4.8 4.7  CL 99 98  CO2 31 26  BUN 33* 36*  CREATININE 1.20 1.35  GLUCOSE 260* 335*   Recent Labs Lab 05/04/13 0715 05/05/13 0500  HGB 9.2* 8.3*  HCT 27.6* 24.5*  WBC 43.3* 29.9*  PLT 181 135*   Dg Chest Port 1 View  05/05/2013  *RADIOLOGY REPORT*  Clinical Data: 77 year old male shortness of breath and cough.  PORTABLE CHEST - 1 VIEW  Comparison: 05/04/2013 and earlier.  Findings: Portable semi upright AP view 0429 hours.  Stable lung volumes.  Stable cardiac size and mediastinal contours.  Mildly decreased patchy confluent basilar airspace disease, greater on the left .  No areas of worsening ventilation.  No pneumothorax or definite effusion.  IMPRESSION: Interval mild radiographic regression of left greater than right lung base airspace  disease compatible with pneumonia.   Original Report Authenticated By: Erskine Speed, M.D.    Dg Chest Portable 1 View  05/04/2013  *RADIOLOGY REPORT*  Clinical Data: Hemoptysis.  Coughing up blood.  Recent removal of tube from the throat.  Short of breath.  PORTABLE CHEST - 1 VIEW  Comparison: 04/21/2013.  Findings: Cardiomegaly and median sternotomy.  Bioprosthetic aortic valve replacement.  Left greater than right coursed airspace opacification is present radiating from the hilum to the bases. The distribution suggests aspiration.   In a patient with hemoptysis, vasculitic pulmonary hemorrhage is a consideration although the pattern is usually more fine in appearance.  IMPRESSION:  1.  Left greater than right basilar airspace disease most compatible with aspiration.  Pneumonia, pulmonary hemorrhage and edema are considered less likely. 2.  Median sternotomy and aortic valve replacement.   Original Report Authenticated By: Andreas Newport, M.D.    ASSESSMENT / PLAN:  PULMONARY A:  Hemoptysis in setting of HCAP. AECOPD with hx of severe COPD. Acute on chronic respiratory failure - baseline nocturnal O2 dep  P: - Sputum culture pending, sent on 5/6. - Procalcitonin 0.21 for a non-ICU patient. - Agree with zosyn and vancomycin pending culture results with stop of vanc if not MRSA is noted. - Oxygen to keep SpO2 > 92%. - F/U CXR. - Continue albuterol/ipratropium. - D/c flovent for now and continue steroids (patient is on prednisone). - Wean off prednisone as tolerated >> change to 40 mg daily and once off would restart flovent. - No indication for bronchoscopy/CT chest at present since hemoptysis is decreasing. - Add cough suppressant prn. - If hemoptysis completely resolves would be a candidate to restart anticoagulation assuming infection is completely addressed.  CARDIOLOGY A: A fib with RVR >> likely related to SIRS/Sepsis.  P: - Hold xarelto, aspirin. - Monitor heart rhythm. - Keep even fluid balance even. - Continue diltiazem for rate control. - See discussion above regarding when to restart anti-coagulation.  HEMATOLOGY A:  Anemia of acute illness. Leukocytosis likely from steroid use and SIRS/Sepsis.  P: - F/U CBC.  Will f/u in AM for clearance of hemoptysis.  Alyson Reedy, M.D. [facility] Pulmonary/Critical Care Medicine. Pager: [phone]. After hours pager: [phone].

## 2013-05-05 NOTE — Progress Notes (Signed)
Patient is currently active with long-term disease management services with [facility] Care Management Program. Patient will receive a post discharge transition of care call and continued monthly home visits for assessments and for education.   Raiford Noble MSN-Ed, RN,BSN, [facility], [phone]

## 2013-05-05 NOTE — Progress Notes (Signed)
[name] Progress Note [place] TEAM 1 - Stepdown/ICU TEAM   VOSHON PETRO WUX:[id number] DOB: 02-15-1936 DOA: 05/04/2013 PCP: Fredirick Maudlin, MD  Brief narrative: 77 year old male patient who was recently hospitalized at [facility] from 04/20/2013 to 04/29/2013 secondary to acute exacerbation of COPD. He was treated with antibiotics and steroids. Also had issues with atrial fibrillation and poorly controlled ventricular rates. Later developed a small bowel obstruction felt to be from immobility and was medically treated and this resolved. Because of the new onset atrial fibrillation he was started on Xarelto. Patient presented back to the [facility] emergency department with complaints of hemoptysis. He's had about 5 episodes prior to presentation. No bloody emesis but both patient and wife have noticed increased exertional shortness of breath since discharge from the hospital. In addition the patient CBGs have been poorly controlled with sugars as high as in the 500s recently. Chest x-ray revealed a left greater than right basilar air space disease most compatible with aspiration but other differentials include pneumonia, pulmonary hemorrhage and edema but are considered less likely. Because of concerns that the hemoptysis may become more significantly worse to require services not available at [facility] it was recommended by the hospitalist service today are that the patient be transferred to [facility]. In addition Dr. [name] who is the patient's primary pulmonologist was made aware of patient's current status and also agree with transferring the patient to [facility].  Assessment/Plan: Active Problems:  Acute on chronic respiratory failure with hypoxia due to:   A) HCAP (healthcare-associated pneumonia)   B) COPD (chronic obstructive pulmonary disease) -baseline oxygen is 2L at HS -now requiring 4L -appreciate pulmonary assistance -cont empiric tx for HCAP -sputum cx  pending -repeat CXR reveals interval mild radiographic regression of left greater than right lung base airspace disease compatible with pneumonia. -cont supportive care/(pulmonary toileting (IS & flutter valve)-mobilize -taper Prednisone- WBC has trended down from >43,000 to 29,000    Hemoptysis -pulmonary medicine feels is 2/2 PNA in setting of recent Xarelto -no indication for FOB since hemoptysis is decreasing -if hemoptysis resolves pulmonary says safe to resume anti-coagulation    Atrial flutter with controlled VR -cont Diltiazem    Hypertension -BP controlled    Type 2 diabetes mellitus, uncontrolled -CBG > 300 after admit and reported > 500 at home which, according to pt/wife, this is not unusal when he is on steroids -cont SSI and increase to resistant if indicated -consider resume Glyburide    Chronic diastolic congestive heart failure -compensated -has focal 1+ edema of ankles which only occurs when taking steroids -CXR not c/w edema    Thrombocytopenia, unspecified -appears chronic in nature and varies between low and normal but not < 100,000   DVT prophylaxis: SCDs Code Status: Full Family Communication: Patient and wife Disposition Plan: Transfer to telemetry Isolation: None  Consultants: Pulmonary  Procedures: None  Antibiotics: Maxipime 5/5 >> 5/5  Zosyn 5/5 >>> Vancomycin 5/5 >>>  HPI/Subjective: Patient alert and although feels better than yesterday still quite dyspneic as compared to his baseline. Still endorsing expectorating bloody sputum but less than yesterday. Wife concerning that patient needs to get out of bed since had prior issues with adynamic ileus/partial small bowel instruction due to prolonged bedrest during last hospitalization.   Objective: Blood pressure 122/53, pulse 74, temperature 98.2 F (36.8 C), temperature source Oral, resp. rate 23, height 5\' 6"  (1.676 m), weight 77.3 kg (170 lb 6.7 oz), SpO2 93.00%.  Intake/Output  Summary (  Last 24 hours) at 05/05/13 1330 Last data filed at 05/05/13 0715  Gross per 24 hour  Intake    240 ml  Output    750 ml  Net   -510 ml     Exam: General: No acute respiratory distress Lungs: Coarse to auscultation bilaterally without wheezes or crackles but diminished at bases, 4L Cardiovascular: Irregular rate and rhythm without murmur gallop or rub normal S1 and S2, trace 1+ peripheral edema at ankle without JVD Abdomen: Nontender, nondistended, soft, bowel sounds positive, no rebound, no ascites, no appreciable mass Musculoskeletal: No significant cyanosis, clubbing of bilateral lower extremities Neurological: Alert and oriented x 3, moves all extremities x 4 without focal neurological deficits, CN 2-12 intact  Data Reviewed: Basic Metabolic Panel:  Recent Labs Lab 05/04/13 0715 05/05/13 0500  NA 136 134*  K 4.8 4.7  CL 99 98  CO2 31 26  GLUCOSE 260* 335*  BUN 33* 36*  CREATININE 1.20 1.35  CALCIUM 8.6 8.4   Liver Function Tests:  Recent Labs Lab 05/04/13 0715 05/05/13 0500  AST 13 10  ALT 24 19  ALKPHOS 71 58  BILITOT 1.3* 1.5*  PROT 7.2 6.5  ALBUMIN 2.8* 2.2*   No results found for this basename: LIPASE, AMYLASE,  in the last 168 hours No results found for this basename: AMMONIA,  in the last 168 hours CBC:  Recent Labs Lab 05/04/13 0715 05/05/13 0500  WBC 43.3* 29.9*  NEUTROABS 34.6*  --   HGB 9.2* 8.3*  HCT 27.6* 24.5*  MCV 92.9 90.1  PLT 181 135*   Cardiac Enzymes:  Recent Labs Lab 05/04/13 0715  TROPONINI <0.30   BNP (last 3 results)  Recent Labs  04/19/13 0950 05/04/13 0715  PROBNP 3127.0* 2471.0*   CBG:  Recent Labs Lab 04/29/13 0712 05/04/13 1614 05/04/13 2206 05/05/13 0750 05/05/13 1152  GLUCAP 106* 268* 386* 313* 181*    Recent Results (from the past 240 hour(s))  MRSA PCR SCREENING     Status: None   Collection Time    05/04/13  1:45 PM      Result Value Range Status   MRSA by PCR NEGATIVE  NEGATIVE  Final   Comment:            The GeneXpert MRSA Assay (FDA     approved for NASAL specimens     only), is one component of a     comprehensive MRSA colonization     surveillance program. It is not     intended to diagnose MRSA     infection nor to guide or     monitor treatment for     MRSA infections.  CULTURE, BLOOD (ROUTINE X 2)     Status: None   Collection Time    05/04/13  2:00 PM      Result Value Range Status   Specimen Description BLOOD ARM LEFT   Final   Special Requests BOTTLES DRAWN AEROBIC AND ANAEROBIC 10CC   Final   Culture  Setup Time 05/04/2013 21:33   Final   Culture     Final   Value:        BLOOD CULTURE RECEIVED NO GROWTH TO DATE CULTURE WILL BE HELD FOR 5 DAYS BEFORE ISSUING A FINAL NEGATIVE REPORT   Report Status PENDING   Incomplete  CULTURE, BLOOD (ROUTINE X 2)     Status: None   Collection Time    05/04/13  2:15 PM      Result  Value Range Status   Specimen Description BLOOD HAND LEFT   Final   Special Requests BOTTLES DRAWN AEROBIC AND ANAEROBIC 10CC   Final   Culture  Setup Time 05/04/2013 21:32   Final   Culture     Final   Value:        BLOOD CULTURE RECEIVED NO GROWTH TO DATE CULTURE WILL BE HELD FOR 5 DAYS BEFORE ISSUING A FINAL NEGATIVE REPORT   Report Status PENDING   Incomplete  CULTURE, EXPECTORATED SPUTUM-ASSESSMENT     Status: None   Collection Time    05/05/13  9:40 AM      Result Value Range Status   Specimen Description SPUTUM   Final   Special Requests Normal   Final   Sputum evaluation     Final   Value: THIS SPECIMEN IS ACCEPTABLE. RESPIRATORY CULTURE REPORT TO FOLLOW.   Report Status 05/05/2013 FINAL   Final     Studies:  Recent x-ray studies have been reviewed in detail by the Attending Physician  Scheduled Meds:  Reviewed in detail by the Attending Physician   Junious Silk, ANP [name] Office  [phone] Pager [phone]  On-Call/Text Page:      [email or web address]      password [email or web address]  If 7PM-7AM, please  contact night-coverage [email or web address] Password [email or web address] 05/05/2013, 1:30 PM   LOS: 1 day   I have examined the patient, reviewed the chart and modified the above note which I agree with.   Kelaiah Escalona,MD [phone] 05/05/2013, 5:05 PM

## 2013-05-05 NOTE — Progress Notes (Signed)
Inpatient Diabetes Program Recommendations  AACE/ADA: New Consensus Statement on Inpatient Glycemic Control (2013)  Target Ranges:  Prepandial:   less than 140 mg/dL      Peak postprandial:   less than 180 mg/dL (1-2 hours)      Critically ill patients:  140 - 180 mg/dL  Results for FRAN, MCREE (MRN [id number]) as of 05/05/2013 10:49  Ref. Range 05/04/2013 16:14 05/04/2013 22:06 05/05/2013 07:50  Glucose-Capillary Latest Range: 70-99 mg/dL 130 (H) 865 (H) 784 (H)    Inpatient Diabetes Program Recommendations Insulin - Basal: Add Lantus 10 units during steroid therapy Diet: add carbohydrate modified to current heart healthy diet Thank you  Piedad Climes BSN, RN,CDE Inpatient Diabetes Coordinator [phone] (team pager)

## 2013-05-05 NOTE — Progress Notes (Signed)
[facility]  Patient Status: Active (receiving services up to time of hospitalization)  [facility] is providing the following services: RN and PT  If patient discharges after hours, please call [phone].   [name] [name] 05/05/2013, 12:42 PM

## 2013-05-06 ENCOUNTER — Inpatient Hospital Stay (HOSPITAL_COMMUNITY): Payer: Medicare Other

## 2013-05-06 DIAGNOSIS — I5032 Chronic diastolic (congestive) heart failure: Secondary | ICD-10-CM

## 2013-05-06 DIAGNOSIS — R918 Other nonspecific abnormal finding of lung field: Secondary | ICD-10-CM | POA: Diagnosis present

## 2013-05-06 DIAGNOSIS — I509 Heart failure, unspecified: Secondary | ICD-10-CM

## 2013-05-06 LAB — GLUCOSE, CAPILLARY
Glucose-Capillary: 254 mg/dL — ABNORMAL HIGH (ref 70–99)
Glucose-Capillary: 342 mg/dL — ABNORMAL HIGH (ref 70–99)
Glucose-Capillary: 301 mg/dL — ABNORMAL HIGH (ref 70–99)
Glucose-Capillary: 192 mg/dL — ABNORMAL HIGH (ref 70–99)

## 2013-05-06 MED ORDER — CLOTRIMAZOLE 1 % EX CREA
TOPICAL_CREAM | Freq: Two times a day (BID) | CUTANEOUS | Status: DC
  Administered 2013-05-06 – 2013-05-08 (×5): via TOPICAL
  Administered 2013-05-08: 1 via TOPICAL
  Administered 2013-05-09 – 2013-05-12 (×7): via TOPICAL
  Filled 2013-05-06: qty 15

## 2013-05-06 MED ORDER — FUROSEMIDE 10 MG/ML IJ SOLN
INTRAMUSCULAR | Status: AC
  Administered 2013-05-06: 20 mg via INTRAVENOUS
  Filled 2013-05-06: qty 4

## 2013-05-06 MED ORDER — ALBUTEROL SULFATE (5 MG/ML) 0.5% IN NEBU
2.5000 mg | INHALATION_SOLUTION | Freq: Once | RESPIRATORY_TRACT | Status: AC
  Administered 2013-05-06: 2.5 mg via RESPIRATORY_TRACT

## 2013-05-06 MED ORDER — ALBUTEROL SULFATE (5 MG/ML) 0.5% IN NEBU
INHALATION_SOLUTION | RESPIRATORY_TRACT | Status: AC
  Filled 2013-05-06: qty 0.5

## 2013-05-06 MED ORDER — FUROSEMIDE 10 MG/ML IJ SOLN: 20.0000 mg | Freq: Two times a day (BID) | INTRAMUSCULAR | Status: DC

## 2013-05-06 MED ORDER — IOHEXOL 300 MG/ML  SOLN
80.0000 mL | Freq: Once | INTRAMUSCULAR | Status: AC | PRN
  Administered 2013-05-06: 80 mL via INTRAVENOUS

## 2013-05-06 MED ORDER — IPRATROPIUM BROMIDE 0.02 % IN SOLN
RESPIRATORY_TRACT | Status: AC
  Filled 2013-05-06: qty 2.5

## 2013-05-06 MED ORDER — ONDANSETRON HCL 4 MG/2ML IJ SOLN
4.0000 mg | Freq: Four times a day (QID) | INTRAMUSCULAR | Status: DC | PRN
  Administered 2013-05-06 – 2013-05-10 (×3): 4 mg via INTRAVENOUS
  Filled 2013-05-06 (×3): qty 2

## 2013-05-06 NOTE — Progress Notes (Signed)
CSW received referral for SNF placement. CSW met with the pt and family to search for placement. Wife is concerned about her husbands insurance benefit and if the SNF will be covered. CSW urged the pt's wife to call her insurance company and she stated that the doctor will handle all of this for her... CSW will do SNF search Thursday, and then the facility chosen will call the insurance company to make sure that SNF will be covered.  If there is any out of pocket charge the pt's wife said that her husband will have to come home with [facility].  Sherald Barge, LCSW-A Clinical Social Worker [phone]

## 2013-05-06 NOTE — Progress Notes (Signed)
Inpatient Diabetes Program Recommendations  AACE/ADA: New Consensus Statement on Inpatient Glycemic Control (2013)  Target Ranges:  Prepandial:   less than 140 mg/dL      Peak postprandial:   less than 180 mg/dL (1-2 hours)      Critically ill patients:  140 - 180 mg/dL     Results for EGAN, BERKHEIMER (MRN [id number]) as of 05/06/2013 11:52  Ref. Range 05/05/2013 07:50 05/05/2013 11:52 05/05/2013 17:03 05/05/2013 19:52  Glucose-Capillary Latest Range: 70-99 mg/dL 413 (H) 244 (H) 010 (H) 256 (H)    Results for KYROS, SALZWEDEL (MRN [id number]) as of 05/06/2013 11:52  Ref. Range 05/06/2013 07:26  Glucose-Capillary Latest Range: 70-99 mg/dL 034 (H)    Inpatient Diabetes Program Recommendations Insulin - Basal: Add Lantus 10 units during steroid therapy Diet: add carbohydrate modified to current heart healthy diet   Will follow. Ambrose Finland RN, MSN, CDE Diabetes Coordinator Inpatient Diabetes Program [phone]

## 2013-05-06 NOTE — Evaluation (Signed)
Occupational Therapy Evaluation Patient Details Name: [name] [name] MRN: [id number] DOB: Dec 12, 1936 Today's Date: 05/06/2013 Time: 6295-2841 OT Time Calculation (min): 22 min  OT Assessment / Plan / Recommendation Clinical Impression  77 yo male admitted with chronic respiratory failure with hypoxia, hemoptsis, afib and HTN that coudl benefit from skilled OT acutely. Recommend HHOT for d/c planning. PT and family want to return home and do not wish to have SNF    OT Assessment  Patient needs continued OT Services    Follow Up Recommendations  Home health OT    Barriers to Discharge      Equipment Recommendations  3 in 1 bedside comode    Recommendations for Other Services    Frequency  Min 2X/week    Precautions / Restrictions Precautions Precautions: Fall;Other (comment) (need for oxygen) Restrictions Weight Bearing Restrictions: No   Pertinent Vitals/Pain Oxygen decr 85% on 4. 5 L nasal cannula     ADL  Eating/Feeding: Set up Where Assessed - Eating/Feeding: Chair Grooming: Wash/dry hands;Wash/dry face;Teeth care;Moderate assistance Where Assessed - Grooming: Unsupported sitting Upper Body Bathing: Chest;Right arm;Left arm;Abdomen;Set up Where Assessed - Upper Body Bathing: Unsupported sitting Lower Body Bathing: Min guard Where Assessed - Lower Body Bathing: Unsupported sit to stand Upper Body Dressing: Supervision/safety Where Assessed - Upper Body Dressing: Unsupported sitting Lower Body Dressing: Moderate assistance Where Assessed - Lower Body Dressing: Unsupported sitting Toilet Transfer: Min guard Toilet Transfer Method: Sit to stand Toilet Transfer Equipment: Raised toilet seat with arms (or 3-in-1 over toilet) Toileting - Clothing Manipulation and Hygiene: Min guard Where Assessed - Toileting Clothing Manipulation and Hygiene: Sit to stand from 3-in-1 or toilet Equipment Used: Other (comment) (oxygen) Transfers/Ambulation Related to ADLs: Pt able to  transfer chair to bed without DME . pt demonstrates decr oxygen saturations with activity at this time.  ADL Comments: Pt completed sponge bath in chair and required extended time. pt states "I can tell one you one thing I dont have any air- that about wore me out" Pt fatigued and required extended restbreak. Pt with decr oxygen saturations into low 80s     OT Diagnosis: Generalized weakness  OT Problem List: Decreased strength;Decreased activity tolerance;Impaired balance (sitting and/or standing);Decreased safety awareness;Decreased knowledge of use of DME or AE;Decreased knowledge of precautions;Cardiopulmonary status limiting activity OT Treatment Interventions: Self-care/ADL training;Therapeutic exercise;DME and/or AE instruction;Therapeutic activities;Patient/family education;Balance training   OT Goals Acute Rehab OT Goals OT Goal Formulation: With patient/family Time For Goal Achievement: 05/20/13 Potential to Achieve Goals: Good ADL Goals Pt Will Perform Grooming: with modified independence;Sitting at sink;Standing at sink;Other (comment) (EC techniques) ADL Goal: Grooming - Progress: Goal set today Pt Will Perform Upper Body Bathing: with modified independence;Sit to stand from chair;Sitting at sink (EC techniques) ADL Goal: Upper Body Bathing - Progress: Goal set today Pt Will Perform Upper Body Dressing: with modified independence;Sit to stand from chair ADL Goal: Upper Body Dressing - Progress: Goal set today Pt Will Transfer to Toilet: with modified independence;Ambulation;Comfort height toilet ADL Goal: Toilet Transfer - Progress: Goal set today Miscellaneous OT Goals Miscellaneous OT Goal #1: Pt will demonstrate basic transfer with oxygen saturations >90 % to decr fall risk with adls OT Goal: Miscellaneous Goal #1 - Progress: Goal set today  Visit Information  Last OT Received On: 05/06/13 Assistance Needed: +1    Subjective Data  Subjective: "I have 5 of those things at  home- those breathing things" Patient Stated Goal: to return home - loves to drive  Prior Functioning     Home Living Lives With: Spouse Available Help at Discharge: Family Type of Home: Mobile home Home Access: Stairs to enter Entrance Stairs-Number of Steps: 5 Entrance Stairs-Rails: Right;Left Home Layout: One level Bathroom Shower/Tub: Walk-in shower;Tub/shower unit Teacher, early years/pre: Yes How Accessible: Accessible via walker Home Adaptive Equipment: Other (comment) (oxygen tanks) Prior Function Level of Independence: Independent Able to Take Stairs?: Yes Driving: Yes Vocation: Retired Musician: No difficulties Dominant Hand: Right         Vision/Perception Vision - History Baseline Vision: Wears glasses only for reading Patient Visual Report: No change from baseline   Cognition  Cognition Arousal/Alertness: Awake/alert Behavior During Therapy: WFL for tasks assessed/performed Overall Cognitive Status: Within Functional Limits for tasks assessed    Extremity/Trunk Assessment Right Upper Extremity Assessment RUE ROM/Strength/Tone: Within functional levels RUE Sensation: WFL - Light Touch RUE Coordination: WFL - gross/fine motor Left Upper Extremity Assessment LUE ROM/Strength/Tone: Within functional levels LUE Sensation: WFL - Light Touch LUE Coordination: WFL - gross/fine motor     Mobility Bed Mobility Bed Mobility: Supine to Sit;Sitting - Scoot to Edge of Bed;Sit to Supine Supine to Sit: 5: Supervision;HOB flat Sitting - Scoot to Edge of Bed: 5: Supervision Sit to Supine: 5: Supervision;HOB flat Transfers Sit to Stand: 4: Min guard;With upper extremity assist;From chair/3-in-1 Stand to Sit: 4: Min guard;With upper extremity assist;To chair/3-in-1     Exercise     Balance     End of Session OT - End of Session Activity Tolerance: Patient limited by fatigue Patient left: in chair;with call  bell/phone within reach;with family/visitor present Nurse Communication: Mobility status;Precautions  GO     Lucile Shutters 05/06/2013, 11:24 AM Pager: [phone]

## 2013-05-06 NOTE — Progress Notes (Signed)
Met with patient and wife at bedside to make aware that [facility] Care Management services will continue to follow upon discharge. Mrs Bagheri reports they do have home health coming out to the house for PT/RN services as well. She states she needs a shower bench at home for patient. Will report this to inpatient RNCM. Mrs Blixt reports concerns about maybe patient needing rehab after discharge. Not sure if PT/OT has worked with patient yet. Will continue to follow and will contact inpatient RNCM regarding conversation.  Raiford Noble, MSN- Ed, Charity fundraiser, BSN - [facility] [phone]

## 2013-05-06 NOTE — Evaluation (Signed)
Clinical/Bedside Swallow Evaluation Patient Details  Name: [name] [name] MRN: [id number] Date of Birth: 12/13/1936  Today's Date: 05/06/2013 Time: 1213-1235 SLP Time Calculation (min): 22 min  Past Medical History:  Past Medical History  Diagnosis Date  . COPD (chronic obstructive pulmonary disease)   . AF (atrial fibrillation)   . Hypertension   . Gout   . Arthritis   . Small bowel obstruction   . Renal insufficiency   . Aortic stenosis   . Hydropneumothorax   . Diabetes mellitus   . CHF (congestive heart failure)   . On home O2     qhs prn  . Pneumonia 05/04/2013    history of pneumonia  . Pneumonia 10/12  . Hydropneumothorax 2012   Past Surgical History:  Past Surgical History  Procedure Laterality Date  . Rotator cuff repair      left and right  . Cardiac valve replacement      aortic valve with a tissue prosthesis and left sided maze proc  . S/p rewiring of sternum for dehiscence    . Gallbladder surgery    . US echocardiography  01-03-11    EF 55-60%  . Cardiovascular stress test  01-26-09    EF 67%  . Coronary angioplasty    . Cholecystectomy    . Sternal incision reclosure  08/29/2010    sternal rewiring  . Cataract extraction w/phaco  10/02/2012    Procedure: CATARACT EXTRACTION PHACO AND INTRAOCULAR LENS PLACEMENT (IOC);  Surgeon: Gemma Payor, MD;  Location: [facility];  Service: Ophthalmology;  Laterality: Right;  CDE 16.68  . Cataract extraction w/phaco  10/27/2012    Procedure: CATARACT EXTRACTION PHACO AND INTRAOCULAR LENS PLACEMENT (IOC);  Surgeon: Gemma Payor, MD;  Location: [facility];  Service: Ophthalmology;  Laterality: Left;  CDE:16.89  . Artificial aortic valve     HPI:  77 yo male admitted with acute on chronic respiratory failure with hypoxia, hemoptsis, afib, HCAP. Chest x-ray revealed a left greater than right basilar air space disease most compatible with aspiration but other differentials include pneumonia, pulmonary hemorrhage and edema but are  considered less likely.  Family requested swallow eval due to recent event with POs lodging in esophagus vs trachea (they are unable to discern.)  Assessment / Plan / Recommendation Clinical Impression  Pt presents with normal oropharyngeal swallow function with RR compatible with swallowing coordination, adequate and consistent exhalations post-swallow, and no signs of compromised airway protection.  Educated pt and wife re: COPD exacerbations and potential impact upon swallow.  Provided with strategies to maximize safety with PO intake during COPD exacerbations.  Verbalized understanding.  REC continue current diet - no SLP f/u warranted.    Aspiration Risk  Mild    Diet Recommendation Regular;Thin liquid   Liquid Administration via: Cup;Straw Medication Administration: Whole meds with liquid Supervision: Patient able to self feed    Other  Recommendations     Follow Up Recommendations  None        Swallow Study Prior Functional Status  Type of Home: Mobile home Lives With: Spouse Available Help at Discharge: Family Vocation: Retired    Radio producer HPI: 77 yo male admitted with acute on chronic respiratory failure with hypoxia, hemoptsis, afib, HCAP. Chest x-ray revealed a left greater than right basilar air space disease most compatible with aspiration but other differentials include pneumonia, pulmonary hemorrhage and edema but are considered less likely.  Type of Study: Bedside swallow evaluation Diet Prior to this Study: Regular;Thin liquids Temperature Spikes  Noted: No Respiratory Status: Other (comment) (4 liters [place]) Behavior/Cognition: Alert;Cooperative;Pleasant mood Oral Cavity - Dentition: Dentures, top;Dentures, bottom Self-Feeding Abilities: Able to feed self Patient Positioning: Upright in chair Baseline Vocal Quality: Clear Volitional Cough: Congested Volitional Swallow: Able to elicit    Oral/Motor/Sensory Function Overall Oral Motor/Sensory Function: Appears  within functional limits for tasks assessed   Ice Chips Ice chips: Within functional limits Presentation: Self Fed   Thin Liquid Thin Liquid: Within functional limits Presentation: Straw    Nectar Thick Nectar Thick Liquid: Not tested   Honey Thick Honey Thick Liquid: Not tested   Puree Puree: Within functional limits   Solid   GO    Solid: Within functional limits Presentation: Self Fed      Correne Lalani L. Samson Frederic, [place] CCC/SLP Pager [phone]  Blenda Mounts Laurice 05/06/2013,12:44 PM

## 2013-05-06 NOTE — Progress Notes (Signed)
PULMONARY  / CRITICAL CARE MEDICINE  Name: [name] [name] MRN: [id number] DOB: 07/06/1936    ADMISSION DATE:  05/04/2013 CONSULTATION DATE:  05/04/2013  REFERRING MD :  Mahala Menghini PRIMARY SERVICE:  Hospitalist  CHIEF COMPLAINT:  hemoptysis  BRIEF PATIENT DESCRIPTION: 77 yo WM with hx of severe COPD, CHF (LVEF 55% to 60% on 4/21), and atrial flutter with recent hospitalization from 4/21-4/30 that is on Xarelto presents with hemoptysis and increasing sob X 1 day,  PCCM consulted for hemoptysis  SIGNIFICANT EVENTS / STUDIES:  5/04 - admitted with hemoptysis   LINES / TUBES: None at this time  CULTURES: None at this time  ANTIBIOTICS: 5/5 Cefepime>>>5/5 5/5 Vanco>>> 5/5 Zoysn>>> .  SUBJECTIVE: Still with significant hemoptysis  VITAL SIGNS: Temp:  [98.3 F (36.8 C)-98.7 F (37.1 C)] 98.7 F (37.1 C) (05/07 0500) Pulse Rate:  [71-75] 74 (05/07 0500) Resp:  [21] 21 (05/06 1423) BP: (110-119)/(47-53) 110/50 mmHg (05/07 1011) SpO2:  [96 %-100 %] 97 % (05/07 0828)  PHYSICAL EXAMINATION: General:  Pleasant, NAD Neuro:  No focal deficits HEENT:  WNL Cardiovascular: IRIR s M Lungs:  L> R basilar crackles, no wheezes.  Abdomen:  Soft, nontender Ext: No clubbing, minimal ankle edema   Recent Labs Lab 05/04/13 0715 05/05/13 0500  NA 136 134*  K 4.8 4.7  CL 99 98  CO2 31 26  BUN 33* 36*  CREATININE 1.20 1.35  GLUCOSE 260* 335*    Recent Labs Lab 05/04/13 0715 05/05/13 0500  HGB 9.2* 8.3*  HCT 27.6* 24.5*  WBC 43.3* 29.9*  PLT 181 135*   Dg Chest Port 1 View  05/05/2013  *RADIOLOGY REPORT*  Clinical Data: 77 year old male shortness of breath and cough.  PORTABLE CHEST - 1 VIEW  Comparison: 05/04/2013 and earlier.  Findings: Portable semi upright AP view 0429 hours.  Stable lung volumes.  Stable cardiac size and mediastinal contours.  Mildly decreased patchy confluent basilar airspace disease, greater on the left .  No areas of worsening ventilation.  No pneumothorax  or definite effusion.  IMPRESSION: Interval mild radiographic regression of left greater than right lung base airspace disease compatible with pneumonia.   Original Report Authenticated By: Erskine Speed, M.D.    ASSESSMENT / PLAN:  PULMONARY A:  Hemoptysis in setting of possible HCAP. Severe COPD - no active wheezing. Acute on chronic respiratory failure - baseline nocturnal O2 dep  P: Hold anticoagulation Cont current abx CT chest Consider FOB for airway exam   CARDIOLOGY A: A fib, rate now controlled  P: Cont curent Rx  HEMATOLOGY A:  Anemia of acute illness. Leukocytosis likely from steroid use and SIRS/Sepsis.  P: - F/U CBC.   Billy Fischer, MD ; [facility] [phone].  After 5:30 PM or weekends, call [phone]

## 2013-05-06 NOTE — Evaluation (Signed)
Physical Therapy Evaluation Patient Details Name: [name] [name] MRN: [id number] DOB: 01/13/1936 Today's Date: 05/06/2013 Time: 4098-1191 PT Time Calculation (min): 23 min  PT Assessment / Plan / Recommendation Clinical Impression  pt readmitted with hemoptysis due to PNA and COPD.  On eval pt shows little tolerance for activity although is weakened from inactivity.  Pt can benefit from PT to maximize I.    PT Assessment  Patient needs continued PT services    Follow Up Recommendations  Home health PT;Supervision for mobility/OOB    Does the patient have the potential to tolerate intense rehabilitation      Barriers to Discharge        Equipment Recommendations  None recommended by PT    Recommendations for Other Services     Frequency Min 3X/week    Precautions / Restrictions Precautions Precautions: Fall Restrictions Weight Bearing Restrictions: No   Pertinent Vitals/Pain       Mobility  Bed Mobility Bed Mobility: Supine to Sit;Sitting - Scoot to Edge of Bed Supine to Sit: 5: Supervision;With rails;HOB flat Sitting - Scoot to Edge of Bed: 6: Modified independent (Device/Increase time) Details for Bed Mobility Assistance: generally safe, but gets him dyspneic Transfers Transfers: Sit to Stand;Stand to Sit Sit to Stand: 5: Supervision Stand to Sit: 5: Supervision Details for Transfer Assistance: vc's for hand placement Ambulation/Gait Ambulation/Gait Assistance: 5: Supervision Ambulation Distance (Feet): 160 Feet (with standing rest to check sats) Assistive device: Other (Comment) (iv pole) Ambulation/Gait Assistance Details: stable pushing IV pole, dyspneic with declining sats on RA  (low of 83%) Gait Pattern: Step-through pattern Stairs: No    Exercises     PT Diagnosis: Difficulty walking;Other (comment) (decr activity toleranc)  PT Problem List: Decreased activity tolerance;Decreased mobility PT Treatment Interventions: Gait training;Stair  training;Functional mobility training;Therapeutic activities;Patient/family education   PT Goals Acute Rehab PT Goals PT Goal Formulation: With patient Time For Goal Achievement: 05/20/13 Potential to Achieve Goals: Good Pt will go Supine/Side to Sit: with modified independence;Other (comment) (no rail) PT Goal: Supine/Side to Sit - Progress: Goal set today Pt will Ambulate: >150 feet;with least restrictive assistive device;with supervision (able to maintain sats on >2-3L O@ with sats >=90%) PT Goal: Ambulate - Progress: Goal set today Pt will Go Up / Down Stairs: 3-5 stairs;with supervision;with least restrictive assistive device PT Goal: Up/Down Stairs - Progress: Goal set today  Visit Information  Last PT Received On: 05/06/13 Assistance Needed: +1    Subjective Data  Patient Stated Goal: Ready to get home   Prior Functioning  Home Living Lives With: Spouse Available Help at Discharge: Family Type of Home: Mobile home Home Access: Stairs to enter Entrance Stairs-Number of Steps: 5 Entrance Stairs-Rails: Right;Left Home Layout: One level Bathroom Shower/Tub: Walk-in shower;Tub/shower unit Teacher, early years/pre: Yes How Accessible: Accessible via walker Home Adaptive Equipment:  (oxygen tanks, but none to get out in the community) Prior Function Level of Independence: Independent Able to Take Stairs?: Yes Driving: Yes Vocation: Retired Musician: No difficulties Dominant Hand: Right    Cognition  Cognition Arousal/Alertness: Awake/alert Behavior During Therapy: WFL for tasks assessed/performed Overall Cognitive Status: Within Functional Limits for tasks assessed    Extremity/Trunk Assessment Right Upper Extremity Assessment RUE ROM/Strength/Tone: Within functional levels Left Upper Extremity Assessment LUE ROM/Strength/Tone: Within functional levels Right Lower Extremity Assessment RLE ROM/Strength/Tone: Within  functional levels Left Lower Extremity Assessment LLE ROM/Strength/Tone: Within functional levels   Balance Balance Balance Assessed: Yes Static Standing Balance Static Standing -  Balance Support: Left upper extremity supported Static Standing - Level of Assistance: 5: Stand by assistance  End of Session PT - End of Session Activity Tolerance: Patient tolerated treatment well;Patient limited by fatigue Patient left: in chair;with call bell/phone within reach;with family/visitor present Nurse Communication: Mobility status  GP     Jashiya Bassett, Eliseo Gum 05/06/2013, 5:22 PM 05/06/2013  [place], PT [phone]  (pager)

## 2013-05-06 NOTE — Progress Notes (Addendum)
[name] Progress Note   [name] [name]:[id number] DOB: 1936/03/16 DOA: 05/04/2013 PCP: Fredirick Maudlin, MD  Assessment/Plan: Active Problems: Acute on chronic respiratory failure with hypoxia due to:   A) HCAP (healthcare-associated pneumonia)   B) COPD (chronic obstructive pulmonary disease) -baseline oxygen is 2L at HS -now requiring 4L continuous  -appreciate pulmonary assistance and rec's -cont empiric tx for HCAP and follow sputum cx (pending) -repeat CXR in am -cont supportive care/(pulmonary toileting (IS & flutter valve) -taper Prednisone slowly -lasix to be given (patient with crackles at bases)  Hemoptysis -pulmonary medicine feels is 2/2 PNA in setting of recent Xarelto use -plan is to get CT scan of the chest and depending results decide on FOB procedure -if hemoptysis resolves pulmonary says safe to resume anti-coagulation. -will monitor and follow any further rec's  Atrial flutter with controlled VR -cont Diltiazem -anticoagulation on hold for now  Hypertension -stable and well controlled -will monitor VS and adjust meds as needed  Type 2 diabetes mellitus, uncontrolled -CBG > 300 after admit and reported > 500 at home which, according to pt/wife, this is not unusal when he is on steroids -cont SSI and increase to resistant if indicated -will resume Glyburide once eating is better.  Chronic diastolic congestive heart failure -overall compensated; but mild crackles at bases bilaterally -will start lasix -has focal 1+ edema of ankles which only occurs when taking steroids. -CXR in am  Thrombocytopenia, unspecified -appears chronic in nature and varies between low and normal  Will monitor for now -heparin products on hold due to hemoptysis  Yeast infection -yeast affecting skin on his penis head -start clotrimazole topically   DVT prophylaxis: SCDs Code Status: Full Family Communication: Patient and wife Disposition Plan: to be  determine; continue telemetry and inpatient status Isolation: None  Consultants: Pulmonary  Procedures: None  Antibiotics: Maxipime 5/5 >> 5/5  Zosyn 5/5 >>> Vancomycin 5/5 >>>  HPI/Subjective: Patient alert and awake; afebrile and breathing slightly better. Still with episodes of hemoptysis (2 overnight and 1 this morning). Experienced some resp distress overnight and require 4L oxygen supplementation.   Objective: Blood pressure 110/50, pulse 74, temperature 98.7 F (37.1 C), temperature source Oral, resp. rate 21, height 5\' 6"  (1.676 m), weight 77.3 kg (170 lb 6.7 oz), SpO2 97.00%. No intake or output data in the 24 hours ending 05/06/13 1033   Exam: General:NAD, afebrile; AAOX3 Lungs: Coarse to auscultation bilaterally without wheezes; mild crackles at basess, 4L Cardiovascular: Irregular rate and rhythm without murmur gallop or rub normal S1 and S2, trace 1+ peripheral edema at ankle without JVD Abdomen: Nontender, nondistended, soft, bowel sounds positive, no rebound, no ascites, no appreciable mass Musculoskeletal: No significant cyanosis, clubbing of bilateral lower extremities Neurological: Alert and oriented x 3, moves all extremities x 4 without focal neurological deficits, CN 2-12 intact  Data Reviewed: Basic Metabolic Panel:  Recent Labs Lab 05/04/13 0715 05/05/13 0500  NA 136 134*  K 4.8 4.7  CL 99 98  CO2 31 26  GLUCOSE 260* 335*  BUN 33* 36*  CREATININE 1.20 1.35  CALCIUM 8.6 8.4   Liver Function Tests:  Recent Labs Lab 05/04/13 0715 05/05/13 0500  AST 13 10  ALT 24 19  ALKPHOS 71 58  BILITOT 1.3* 1.5*  PROT 7.2 6.5  ALBUMIN 2.8* 2.2*   CBC:  Recent Labs Lab 05/04/13 0715 05/05/13 0500  WBC 43.3* 29.9*  NEUTROABS 34.6*  --   HGB 9.2* 8.3*  HCT 27.6* 24.5*  MCV  92.9 90.1  PLT 181 135*   Cardiac Enzymes:  Recent Labs Lab 05/04/13 0715  TROPONINI <0.30   BNP (last 3 results)  Recent Labs  04/19/13 0950 05/04/13 0715   PROBNP 3127.0* 2471.0*   CBG:  Recent Labs Lab 05/05/13 0750 05/05/13 1152 05/05/13 1703 05/05/13 1952 05/06/13 0726  GLUCAP 313* 181* 258* 256* 254*    Recent Results (from the past 240 hour(s))  MRSA PCR SCREENING     Status: None   Collection Time    05/04/13  1:45 PM      Result Value Range Status   MRSA by PCR NEGATIVE  NEGATIVE Final   Comment:            The GeneXpert MRSA Assay (FDA     approved for NASAL specimens     only), is one component of a     comprehensive MRSA colonization     surveillance program. It is not     intended to diagnose MRSA     infection nor to guide or     monitor treatment for     MRSA infections.  CULTURE, BLOOD (ROUTINE X 2)     Status: None   Collection Time    05/04/13  2:00 PM      Result Value Range Status   Specimen Description BLOOD ARM LEFT   Final   Special Requests BOTTLES DRAWN AEROBIC AND ANAEROBIC 10CC   Final   Culture  Setup Time 05/04/2013 21:33   Final   Culture     Final   Value:        BLOOD CULTURE RECEIVED NO GROWTH TO DATE CULTURE WILL BE HELD FOR 5 DAYS BEFORE ISSUING A FINAL NEGATIVE REPORT   Report Status PENDING   Incomplete  CULTURE, BLOOD (ROUTINE X 2)     Status: None   Collection Time    05/04/13  2:15 PM      Result Value Range Status   Specimen Description BLOOD HAND LEFT   Final   Special Requests BOTTLES DRAWN AEROBIC AND ANAEROBIC 10CC   Final   Culture  Setup Time 05/04/2013 21:32   Final   Culture     Final   Value:        BLOOD CULTURE RECEIVED NO GROWTH TO DATE CULTURE WILL BE HELD FOR 5 DAYS BEFORE ISSUING A FINAL NEGATIVE REPORT   Report Status PENDING   Incomplete  CULTURE, EXPECTORATED SPUTUM-ASSESSMENT     Status: None   Collection Time    05/05/13  9:40 AM      Result Value Range Status   Specimen Description SPUTUM   Final   Special Requests Normal   Final   Sputum evaluation     Final   Value: THIS SPECIMEN IS ACCEPTABLE. RESPIRATORY CULTURE REPORT TO FOLLOW.   Report Status  05/05/2013 FINAL   Final  CULTURE, RESPIRATORY (NON-EXPECTORATED)     Status: None   Collection Time    05/05/13  9:40 AM      Result Value Range Status   Specimen Description SPUTUM   Final   Special Requests NONE   Final   Gram Stain     Final   Value: MODERATE WBC PRESENT, PREDOMINANTLY PMN     RARE SQUAMOUS EPITHELIAL CELLS PRESENT     RARE GRAM POSITIVE COCCI     IN PAIRS RARE YEAST   Culture NORMAL OROPHARYNGEAL FLORA   Final   Report Status PENDING   Incomplete  Calub Tarnow,MD [phone] 05/06/2013, 10:33 AM

## 2013-05-07 ENCOUNTER — Inpatient Hospital Stay (HOSPITAL_COMMUNITY): Payer: Medicare Other

## 2013-05-07 DIAGNOSIS — J9620 Acute and chronic respiratory failure, unspecified whether with hypoxia or hypercapnia: Secondary | ICD-10-CM | POA: Insufficient documentation

## 2013-05-07 DIAGNOSIS — J189 Pneumonia, unspecified organism: Secondary | ICD-10-CM

## 2013-05-07 DIAGNOSIS — I4891 Unspecified atrial fibrillation: Secondary | ICD-10-CM

## 2013-05-07 LAB — CULTURE, RESPIRATORY W GRAM STAIN

## 2013-05-07 LAB — BASIC METABOLIC PANEL
BUN: 40 mg/dL — ABNORMAL HIGH (ref 6–23)
CO2: 31 mEq/L (ref 19–32)
Calcium: 8.2 mg/dL — ABNORMAL LOW (ref 8.4–10.5)
Chloride: 92 mEq/L — ABNORMAL LOW (ref 96–112)
Creatinine, Ser: 1.6 mg/dL — ABNORMAL HIGH (ref 0.50–1.35)
GFR calc Af Amer: 47 mL/min — ABNORMAL LOW (ref >90)
GFR calc non Af Amer: 41 mL/min — ABNORMAL LOW (ref >90)
Glucose, Bld: 316 mg/dL — ABNORMAL HIGH (ref 70–99)
Potassium: 4.7 mEq/L (ref 3.5–5.1)
Sodium: 132 mEq/L — ABNORMAL LOW (ref 135–145)

## 2013-05-07 LAB — CBC
Hemoglobin: 7.6 g/dL — ABNORMAL LOW (ref 13.0–17.0)
Platelets: 126 10*3/uL — ABNORMAL LOW (ref 150–400)
RBC: 2.55 MIL/uL — ABNORMAL LOW (ref 4.22–5.81)
WBC: 36.5 10*3/uL — ABNORMAL HIGH (ref 4.0–10.5)

## 2013-05-07 LAB — GLUCOSE, CAPILLARY
Glucose-Capillary: 287 mg/dL — ABNORMAL HIGH (ref 70–99)
Glucose-Capillary: 315 mg/dL — ABNORMAL HIGH (ref 70–99)

## 2013-05-07 LAB — BLOOD GAS, ARTERIAL
Bicarbonate: 30 mEq/L — ABNORMAL HIGH (ref 20.0–24.0)
O2 Saturation: 91.6 %
Patient temperature: 98.1
TCO2: 31.4 mmol/L (ref 0–100)
pH, Arterial: 7.451 — ABNORMAL HIGH (ref 7.350–7.450)

## 2013-05-07 LAB — VANCOMYCIN, TROUGH: Vancomycin Tr: 19.9 ug/mL (ref 10.0–20.0)

## 2013-05-07 MED ORDER — SODIUM CHLORIDE 0.9 % IV SOLN
INTRAVENOUS | Status: DC
  Administered 2013-05-07 – 2013-05-13 (×2): via INTRAVENOUS

## 2013-05-07 MED ORDER — FUROSEMIDE 10 MG/ML IJ SOLN
40.0000 mg | Freq: Two times a day (BID) | INTRAMUSCULAR | Status: DC
  Administered 2013-05-07 – 2013-05-08 (×2): 40 mg via INTRAVENOUS
  Filled 2013-05-07 (×4): qty 4

## 2013-05-07 MED ORDER — LEVALBUTEROL HCL 0.63 MG/3ML IN NEBU
0.6300 mg | INHALATION_SOLUTION | RESPIRATORY_TRACT | Status: DC | PRN
  Administered 2013-05-08 – 2013-05-20 (×6): 0.63 mg via RESPIRATORY_TRACT
  Filled 2013-05-07 (×9): qty 3

## 2013-05-07 MED ORDER — BIOTENE DRY MOUTH MT LIQD
15.0000 mL | Freq: Two times a day (BID) | OROMUCOSAL | Status: DC
  Administered 2013-05-07 – 2013-05-12 (×10): 15 mL via OROMUCOSAL

## 2013-05-07 MED ORDER — ALBUTEROL SULFATE (5 MG/ML) 0.5% IN NEBU
INHALATION_SOLUTION | RESPIRATORY_TRACT | Status: AC
  Administered 2013-05-07: 05:00:00
  Filled 2013-05-07: qty 0.5

## 2013-05-07 MED ORDER — METHYLPREDNISOLONE SODIUM SUCC 40 MG IJ SOLR
40.0000 mg | INTRAMUSCULAR | Status: DC
  Administered 2013-05-07: 40 mg via INTRAVENOUS
  Filled 2013-05-07: qty 1

## 2013-05-07 MED ORDER — IPRATROPIUM BROMIDE 0.02 % IN SOLN
0.5000 mg | Freq: Four times a day (QID) | RESPIRATORY_TRACT | Status: DC
  Administered 2013-05-07 – 2013-05-26 (×74): 0.5 mg via RESPIRATORY_TRACT
  Filled 2013-05-07 (×77): qty 2.5

## 2013-05-07 MED ORDER — FUROSEMIDE 10 MG/ML IJ SOLN
INTRAMUSCULAR | Status: AC
  Administered 2013-05-07: 60 mg via INTRAVENOUS
  Filled 2013-05-07: qty 8

## 2013-05-07 MED ORDER — METHYLPREDNISOLONE SODIUM SUCC 125 MG IJ SOLR
60.0000 mg | Freq: Once | INTRAMUSCULAR | Status: DC
  Administered 2013-05-07: 60 mg via INTRAVENOUS
  Filled 2013-05-07: qty 0.96
  Filled 2013-05-07: qty 2

## 2013-05-07 MED ORDER — ALBUTEROL SULFATE (5 MG/ML) 0.5% IN NEBU: 2.5000 mg | INHALATION_SOLUTION | RESPIRATORY_TRACT | Status: DC | PRN

## 2013-05-07 MED ORDER — LEVALBUTEROL HCL 0.63 MG/3ML IN NEBU
0.6300 mg | INHALATION_SOLUTION | Freq: Four times a day (QID) | RESPIRATORY_TRACT | Status: DC
  Administered 2013-05-07 (×2): 0.63 mg via RESPIRATORY_TRACT
  Filled 2013-05-07 (×2): qty 3

## 2013-05-07 MED ORDER — VANCOMYCIN HCL 500 MG IV SOLR
500.0000 mg | Freq: Two times a day (BID) | INTRAVENOUS | Status: DC
  Administered 2013-05-07 – 2013-05-08 (×2): 500 mg via INTRAVENOUS
  Filled 2013-05-07 (×3): qty 500

## 2013-05-07 MED ORDER — METHYLPREDNISOLONE SODIUM SUCC 125 MG IJ SOLR: 60.0000 mg | Freq: Two times a day (BID) | INTRAMUSCULAR | Status: DC

## 2013-05-07 MED ORDER — LEVALBUTEROL HCL 0.63 MG/3ML IN NEBU
0.6300 mg | INHALATION_SOLUTION | Freq: Four times a day (QID) | RESPIRATORY_TRACT | Status: DC
  Administered 2013-05-08 – 2013-05-26 (×71): 0.63 mg via RESPIRATORY_TRACT
  Filled 2013-05-07 (×116): qty 3

## 2013-05-07 MED ORDER — FUROSEMIDE 10 MG/ML IJ SOLN: 60.0000 mg | Freq: Once | INTRAMUSCULAR | Status: DC

## 2013-05-07 MED ORDER — METHYLPREDNISOLONE SODIUM SUCC 125 MG IJ SOLR
INTRAMUSCULAR | Status: AC
  Filled 2013-05-07: qty 2

## 2013-05-07 NOTE — Progress Notes (Signed)
Pt. O2 sat decreased to 83% on 4L o2 with pt. C/O dyspnea and increased WOB.  Pt RR 32 BP 123/72 HR 116 Aflutter.  O2 increased to 40% venti with no improvement.  O2 titrated to 50% venti and NP notified.  Order received for stat albuterol neb and NP to bedside to evaluate pt. Additional orders received for 60mg  IV lasix, and 100mg  IV solumedrol. Meds given per order O2 sat increased to 89-91% and patient still with increased WOB.  Order received to transfer pt to SDU. Report called to [facility] and pt. transferred to [id number] by RN and NP.

## 2013-05-07 NOTE — Progress Notes (Signed)
At this time, pt is feeling better with less air hunger and SOB. WOB is a lot less and his RR is in the 20s. O2 sat 94% with venti mask at 50%. Will cont treatment plan as is for now and cont to watch closely. Will report to oncoming attending.  Jimmye Norman, NP [name]

## 2013-05-07 NOTE — Progress Notes (Addendum)
PULMONARY  / CRITICAL CARE MEDICINE  Name: [name] [name] MRN: [id number] DOB: 1936-09-14    ADMISSION DATE:  05/04/2013 CONSULTATION DATE:  05/04/2013  REFERRING MD :  Mahala Menghini PRIMARY SERVICE:  Hospitalist  CHIEF COMPLAINT:  hemoptysis  BRIEF PATIENT DESCRIPTION: 77 yo WM with hx of severe COPD, CHF (LVEF 55% to 60% on 4/21), and atrial flutter with recent hospitalization from 4/21-4/30 that is on Xarelto presents with hemoptysis and increasing sob X 1 day,  PCCM consulted for hemoptysis  SIGNIFICANT EVENTS / STUDIES:  5/04 - admitted with hemoptysis  5/7 CT chest: dense LLL consolidation, no evidence of tumor or mass 5/8 early AM - transferred to SDU with increased SOB   LINES / TUBES: None at this time  CULTURES: MRSA PCR 5/5 >> NEG Blood 5/5 >> NEG Resp 5/6 >> mod candida Strep Ag 5/6 >> NEG Legionella 5/6 >> NEG  ANTIBIOTICS: 5/5 Cefepime>>>5/5 5/5 Vanco>>> 5/5 Zoysn>>> .  SUBJECTIVE: Events noted. Comfortable on VM  VITAL SIGNS: Temp:  [97.8 F (36.6 C)-99.3 F (37.4 C)] 98.3 F (36.8 C) (05/08 1627) Pulse Rate:  [62-124] 77 (05/08 1300) Resp:  [15-27] 18 (05/08 1627) BP: (110-142)/(40-120) 139/40 mmHg (05/08 1627) SpO2:  [90 %-98 %] 95 % (05/08 1627) FiO2 (%):  [50 %] 50 % (05/08 1627)  PHYSICAL EXAMINATION: General:  Pleasant, NAD Neuro:  No focal deficits HEENT:  WNL Cardiovascular: IRIR s M Lungs: bronchial BS in LLL, R basilar crackles, no wheezes.  Abdomen:  Soft, nontender Ext: No clubbing, minimal ankle edema   Recent Labs Lab 05/04/13 0715 05/05/13 0500 05/07/13 1100  NA 136 134* 132*  K 4.8 4.7 4.7  CL 99 98 92*  CO2 31 26 31   BUN 33* 36* 40*  CREATININE 1.20 1.35 1.60*  GLUCOSE 260* 335* 316*    Recent Labs Lab 05/04/13 0715 05/05/13 0500 05/07/13 1100  HGB 9.2* 8.3* 7.6*  HCT 27.6* 24.5* 22.9*  WBC 43.3* 29.9* 36.5*  PLT 181 135* 126*   Ct Chest W Contrast  05/06/2013  *RADIOLOGY REPORT*  Clinical Data: Hemoptysis.   Evaluate infiltrates.  CT CHEST WITH CONTRAST  Technique:  Multidetector CT imaging of the chest was performed following the standard protocol during bolus administration of intravenous contrast.  Contrast: 80mL OMNIPAQUE IOHEXOL 300 MG/ML  SOLN  Comparison: Abdomen and pelvis CT 04/23/2013  Findings: Lungs/pleura: There is a small left pleural effusion. Airspace consolidation involving much of the left lower lobe is identified.  This is a new finding when compared with 04/23/2013. Several patchy opacities are noted within the left upper lobe. Moderate background changes of emphysema identified.  Within the right upper lobe there is a 4 mm nodule, image 17/series 3.  This is unchanged from previous exam.  There is a right upper lobe nodule identified measuring 5 mm, image 25/series 3.  Heart/Mediastinum: The heart size is moderately enlarged.  The patient is status post median sternotomy and CABG procedure.  The right paratracheal lymph node is enlarged measuring 1.2 cm, image 12/series 2.  This is compared with 0.8 cm previously.  The subcarinal lymph node is enlarged measuring 1.5 cm, image 28/series 2.  This is compared with 0.8 cm.  Lymph node posterior to the left mainstem bronchus is new measuring 0.8 cm.  Within the posterior mediastinum there is a lymph node adjacent to the descending thoracic or aorta measuring 9 mm.  Previously this measured 8 mm.  Upper abdomen: The adrenal glands appear normal.  No focal  liver abnormalities identified.  There are multiple cysts arising from both kidneys.  The thyroid gland appears unremarkable.  Bones/Musculoskeletal:  Review of the visualized osseous structures shows multilevel spondylosis within the thoracic spine.  No aggressive lytic or sclerotic bone lesions.  IMPRESSION:  1.  Dense airspace consolidation is noted within the left lower lobe.  As this is a new finding from 04/23/2013 this most likely reflects pneumonia and/or aspiration.  Follow-up imaging is advised  to ensure resolution.  If this does not resolve then consider further evaluation with bronchoscopy and tissue sampling. 2.  Multiple enlarged mediastinal lymph nodes are new from 09/06/2010.  In the setting of pneumonia this is a nonspecific and may be reactive in etiology. Attention on follow-up imaging advised. 3. 5 mm right upper lobe nodule is new from previous exam. If the patient is at high risk for bronchogenic carcinoma, follow-up chest CT at 6-12 months is recommended.  If the patient is at low risk for bronchogenic carcinoma, follow-up chest CT at 12 months is recommended.  This recommendation follows the consensus statement: Guidelines for Management of Small Pulmonary Nodules Detected on CT Scans: A Statement from the Fleischner Society as published in Radiology 2005; 237:395-400. 4.  Left pleural effusion.   Original Report Authenticated By: Signa Kell, M.D.    Dg Chest Port 1 View  05/07/2013  *RADIOLOGY REPORT*  Clinical Data: 77 year old male with respiratory distress, shortness of breath.  PORTABLE CHEST - 1 VIEW  Comparison: 05/06/2013 and earlier.  Findings: AP portable semi upright view at 0435 hours.  Since 04/05/2013, widespread airspace disease at the left lower and midlung has progressed.  Increased left lung base density and obscuration of the left hemidiaphragm.  The right lung is stable.  Stable cardiac size and mediastinal contours.  Visualized tracheal air column is within normal limits.  IMPRESSION: Radiographically progressed left lung pneumonia since 05/05/2013. Dense left lower lobe consolidation.   Original Report Authenticated By: Erskine Speed, M.D.    ASSESSMENT / PLAN:  PULMONARY A:  Hemoptysis in setting of possible HCAP. Severe COPD - no active wheezing. Acute on chronic respiratory failure - baseline nocturnal O2 dep  P: Holding anticoagulation Cont current abx - consider D/C Vanc if final resp cx negative for resistant GPC BD regimen adjusted CT chest  reviewed No indication for FOB @ this time  CARDIOLOGY A: A fib, rate now controlled  P: Cont curent Rx  HEMATOLOGY A:  Anemia of acute illness. Leukocytosis likely from steroid use and SIRS/Sepsis.  P: D/C steroids in absence of wheezing - F/U CBC.  I have spoken with Dr Gwenlyn Perking. PCCM will take on our service. Watch in SDU through day today  Billy Fischer, MD ; [facility] [phone].  After 5:30 PM or weekends, call [phone]

## 2013-05-07 NOTE — Progress Notes (Signed)
Subjective: [name] [name] is a 77 yo WM admitted 05/04/13 as a transfer from [facility] with hemoptysis. He has a PMHx significant for COPD, CHF, aortic valve replacement, pulmonary HTN, DM II, and chronic resp failure on 2L O2 per [place] at night. He had been hospitalized 04/20/13-04/29/13 at [facility] with AECOPD and SBO. Hx Afib/flutter on Xeralto.  After being home for just a few days, he returned to the ED after coughing up blood. At that point, he was found to have AECOPD, CHF, HAP, SIRS/sepsis, and resp failure and was admitted to [facility]. He has progressed slowly since admission having been placed on multi-antibiotic therapy, Lasix, steroids, nebs and his [place] was stopped secondary to the hemoptysis, which has decreased over his stay. Tonight, the RN paged this NP secondary to the patient desatting into the low 80s with increased WOB and placed on 50% venti mask with sats slowly coming up to the 89-91% range. A stat neb tx was given and NP to bedside. Upon arrival, pt was treated with Albuterol neb, Lasix 60mg , Solumedrol 120mg , stat CXR, ABG and continued on venti mask. The ABG showed pH 7.44 with PCO2 in the 40s and PO2 of 57%. His WOB gradually lightened after above treatment and his sats came up, so pt was left on Venti mask and transferred to SDU for closer monitoring.   Objective: Vital signs in last 24 hours: Temp:  [97.5 F (36.4 C)-98.8 F (37.1 C)] 98.8 F (37.1 C) (05/08 0407) Pulse Rate:  [62-77] 62 (05/08 0407) Resp:  [19-20] 20 (05/08 0407) BP: (104-118)/(40-70) 118/70 mmHg (05/08 0407) SpO2:  [90 %-98 %] 90 % (05/08 0407) Weight change:  Last BM Date: 05/05/13  Intake/Output from previous day: 05/07 0701 - 05/08 0700 In: 480 [P.O.:480] Out: 100 [Urine:100] Intake/Output this shift: Total I/O In: -  Out: 100 [Urine:100]  PE: Gen: fairly well appearing male in mild resp distress but not toxic.  HEENT: No JVD noted.  CARD: Irreg irreg with rate in the 90s-120s (worsened after neb). No LE  edema noted. RESP: increased WOB, although improving. Fair to poor air exchange, worse on the left. + crackles and wheezing-better after neb and steroid.  ABD: soft, NT MSK: MOE x 4 NEURO: grossly intact.  SKIN: pink, warm and dry. PSYCH: Alert and oriented. Pleasant, appropriate.  Lab Results:  Recent Labs  05/04/13 0715 05/05/13 0500  WBC 43.3* 29.9*  HGB 9.2* 8.3*  HCT 27.6* 24.5*  PLT 181 135*   BMET  Recent Labs  05/04/13 0715 05/05/13 0500  NA 136 134*  K 4.8 4.7  CL 99 98  CO2 31 26  GLUCOSE 260* 335*  BUN 33* 36*  CREATININE 1.20 1.35  CALCIUM 8.6 8.4    Studies/Results: Ct Chest W Contrast  05/06/2013  *RADIOLOGY REPORT*  Clinical Data: Hemoptysis.  Evaluate infiltrates.  CT CHEST WITH CONTRAST  Technique:  Multidetector CT imaging of the chest was performed following the standard protocol during bolus administration of intravenous contrast.  Contrast: 80mL OMNIPAQUE IOHEXOL 300 MG/ML  SOLN  Comparison: Abdomen and pelvis CT 04/23/2013  Findings: Lungs/pleura: There is a small left pleural effusion. Airspace consolidation involving much of the left lower lobe is identified.  This is a new finding when compared with 04/23/2013. Several patchy opacities are noted within the left upper lobe. Moderate background changes of emphysema identified.  Within the right upper lobe there is a 4 mm nodule, image 17/series 3.  This is unchanged from previous exam.  There is  a right upper lobe nodule identified measuring 5 mm, image 25/series 3.  Heart/Mediastinum: The heart size is moderately enlarged.  The patient is status post median sternotomy and CABG procedure.  The right paratracheal lymph node is enlarged measuring 1.2 cm, image 12/series 2.  This is compared with 0.8 cm previously.  The subcarinal lymph node is enlarged measuring 1.5 cm, image 28/series 2.  This is compared with 0.8 cm.  Lymph node posterior to the left mainstem bronchus is new measuring 0.8 cm.  Within the  posterior mediastinum there is a lymph node adjacent to the descending thoracic or aorta measuring 9 mm.  Previously this measured 8 mm.  Upper abdomen: The adrenal glands appear normal.  No focal liver abnormalities identified.  There are multiple cysts arising from both kidneys.  The thyroid gland appears unremarkable.  Bones/Musculoskeletal:  Review of the visualized osseous structures shows multilevel spondylosis within the thoracic spine.  No aggressive lytic or sclerotic bone lesions.  IMPRESSION:  1.  Dense airspace consolidation is noted within the left lower lobe.  As this is a new finding from 04/23/2013 this most likely reflects pneumonia and/or aspiration.  Follow-up imaging is advised to ensure resolution.  If this does not resolve then consider further evaluation with bronchoscopy and tissue sampling. 2.  Multiple enlarged mediastinal lymph nodes are new from 09/06/2010.  In the setting of pneumonia this is a nonspecific and may be reactive in etiology. Attention on follow-up imaging advised. 3. 5 mm right upper lobe nodule is new from previous exam. If the patient is at high risk for bronchogenic carcinoma, follow-up chest CT at 6-12 months is recommended.  If the patient is at low risk for bronchogenic carcinoma, follow-up chest CT at 12 months is recommended.  This recommendation follows the consensus statement: Guidelines for Management of Small Pulmonary Nodules Detected on CT Scans: A Statement from the Fleischner Society as published in Radiology 2005; 237:395-400. 4.  Left pleural effusion.   Original Report Authenticated By: Signa Kell, M.D.    Dg Chest Port 1 View  05/07/2013  *RADIOLOGY REPORT*  Clinical Data: 77 year old male with respiratory distress, shortness of breath.  PORTABLE CHEST - 1 VIEW  Comparison: 05/06/2013 and earlier.  Findings: AP portable semi upright view at 0435 hours.  Since 04/05/2013, widespread airspace disease at the left lower and midlung has progressed.   Increased left lung base density and obscuration of the left hemidiaphragm.  The right lung is stable.  Stable cardiac size and mediastinal contours.  Visualized tracheal air column is within normal limits.  IMPRESSION: Radiographically progressed left lung pneumonia since 05/05/2013. Dense left lower lobe consolidation.   Original Report Authenticated By: Erskine Speed, M.D.     Medications: medication reviewed  Assessment/Plan: 1. Acute on chronic resp failure, multifactorial with COPD, CHF, and pneumonia. Transferred to SDU for closer monitoring. Will attempt to leave on Venti mask for now, but may need BIPAP if WOB doesn't slow and/or sats do not stay up. Avoid NRB as he is a chronic CO2 retainer. Cont IV steroids for a few doses, then back to po as improves. Cont IV Lasix x 4 doses with careful I&O. Place Foley for that reason. Watch creat. Nebs ATC and prn. Cont abx. F/UP CXR tomorrow. Tonight's CXR with worsening PNA.  2. DM II-stable, cont CBGs with SSI. 3. Afib with RVR-avoid metoprolol for now. Can use Cardizem IV or drip if HR stays up.  4. Hx Aortic valve replacement 5. Hemoptysis-conts but  lessened. CBC today. Holding [place].  6. SIRS-BP stable, procalcitonin q48hrs. 7. Prophylaxis-SCDs.  I believe pulmonary is still following, but may call them early today to see pt in f/up.   LOS: 3 days   KIRBY-GRAHAM, Neils [place] 05/07/2013, 5:10 AM

## 2013-05-07 NOTE — Progress Notes (Signed)
ANTIBIOTIC CONSULT NOTE   Pharmacy Consult for vancomycin Indication: rule out pneumonia  No Known Allergies  Labs:  Recent Labs  05/05/13 0500 05/07/13 1100  WBC 29.9* 36.5*  HGB 8.3* 7.6*  PLT 135* 126*  CREATININE 1.35  --    Estimated Creatinine Clearance: 47 ml/min (by C-G formula based on Cr of 1.35).  Recent Labs  05/07/13 1100  VANCOTROUGH 19.9     Microbiology: Recent Results (from the past 720 hour(s))  MRSA PCR SCREENING     Status: None   Collection Time    04/19/13  3:08 PM      Result Value Range Status   MRSA by PCR NEGATIVE  NEGATIVE Final   Comment:            The GeneXpert MRSA Assay (FDA     approved for NASAL specimens     only), is one component of a     comprehensive MRSA colonization     surveillance program. It is not     intended to diagnose MRSA     infection nor to guide or     monitor treatment for     MRSA infections.  MRSA PCR SCREENING     Status: None   Collection Time    05/04/13  1:45 PM      Result Value Range Status   MRSA by PCR NEGATIVE  NEGATIVE Final   Comment:            The GeneXpert MRSA Assay (FDA     approved for NASAL specimens     only), is one component of a     comprehensive MRSA colonization     surveillance program. It is not     intended to diagnose MRSA     infection nor to guide or     monitor treatment for     MRSA infections.  CULTURE, BLOOD (ROUTINE X 2)     Status: None   Collection Time    05/04/13  2:00 PM      Result Value Range Status   Specimen Description BLOOD ARM LEFT   Final   Special Requests BOTTLES DRAWN AEROBIC AND ANAEROBIC 10CC   Final   Culture  Setup Time 05/04/2013 21:33   Final   Culture     Final   Value:        BLOOD CULTURE RECEIVED NO GROWTH TO DATE CULTURE WILL BE HELD FOR 5 DAYS BEFORE ISSUING A FINAL NEGATIVE REPORT   Report Status PENDING   Incomplete  CULTURE, BLOOD (ROUTINE X 2)     Status: None   Collection Time    05/04/13  2:15 PM      Result Value Range  Status   Specimen Description BLOOD HAND LEFT   Final   Special Requests BOTTLES DRAWN AEROBIC AND ANAEROBIC 10CC   Final   Culture  Setup Time 05/04/2013 21:32   Final   Culture     Final   Value:        BLOOD CULTURE RECEIVED NO GROWTH TO DATE CULTURE WILL BE HELD FOR 5 DAYS BEFORE ISSUING A FINAL NEGATIVE REPORT   Report Status PENDING   Incomplete  CULTURE, EXPECTORATED SPUTUM-ASSESSMENT     Status: None   Collection Time    05/05/13  9:40 AM      Result Value Range Status   Specimen Description SPUTUM   Final   Special Requests Normal   Final   Sputum evaluation  Final   Value: THIS SPECIMEN IS ACCEPTABLE. RESPIRATORY CULTURE REPORT TO FOLLOW.   Report Status 05/05/2013 FINAL   Final  CULTURE, RESPIRATORY (NON-EXPECTORATED)     Status: None   Collection Time    05/05/13  9:40 AM      Result Value Range Status   Specimen Description SPUTUM   Final   Special Requests NONE   Final   Gram Stain     Final   Value: MODERATE WBC PRESENT, PREDOMINANTLY PMN     RARE SQUAMOUS EPITHELIAL CELLS PRESENT     RARE GRAM POSITIVE COCCI     IN PAIRS RARE YEAST   Culture     Final   Value: MODERATE CANDIDA ALBICANS     RARE FUNGUS (MOLD) ISOLATED, PROBABLE CONTAMINANT/COLONIZER (SAPROPHYTE). CONTACT MICROBIOLOGY IF FURTHER IDENTIFICATION REQUIRED [id number].   Report Status 05/07/2013 FINAL   Final    Assessment: 77 year old man with recent hospitalization at [facility] in April now with hemoptysis.  Vancomycin and cefepime to start for 8 days for possible HCAP.  Vancomycin trough = 19.9 mcg/dl today, drawn 2 hours late  Goal of Therapy:  Vancomycin trough level 15-20 mcg/ml  Plan: 1) Decrease vancomycin to 500 mg iv Q 12 hours 2) Continue to follow  Thank you. Okey Regal, PharmD [phone]   05/07/2013,11:57 AM

## 2013-05-08 ENCOUNTER — Inpatient Hospital Stay (HOSPITAL_COMMUNITY): Payer: Medicare Other

## 2013-05-08 DIAGNOSIS — D696 Thrombocytopenia, unspecified: Secondary | ICD-10-CM

## 2013-05-08 LAB — CBC
HCT: 23.2 % — ABNORMAL LOW (ref 39.0–52.0)
Hemoglobin: 7.6 g/dL — ABNORMAL LOW (ref 13.0–17.0)
MCH: 29.9 pg (ref 26.0–34.0)
MCHC: 32.8 g/dL (ref 30.0–36.0)
MCV: 91.3 fL (ref 78.0–100.0)
RDW: 17 % — ABNORMAL HIGH (ref 11.5–15.5)

## 2013-05-08 LAB — BASIC METABOLIC PANEL
BUN: 45 mg/dL — ABNORMAL HIGH (ref 6–23)
CO2: 32 mEq/L (ref 19–32)
GFR calc non Af Amer: 40 mL/min — ABNORMAL LOW (ref >90)
Glucose, Bld: 216 mg/dL — ABNORMAL HIGH (ref 70–99)
Potassium: 4.4 mEq/L (ref 3.5–5.1)
Sodium: 134 mEq/L — ABNORMAL LOW (ref 135–145)

## 2013-05-08 LAB — EXPECTORATED SPUTUM ASSESSMENT W GRAM STAIN, RFLX TO RESP C

## 2013-05-08 LAB — PROCALCITONIN: Procalcitonin: 0.41 ng/mL

## 2013-05-08 LAB — GLUCOSE, CAPILLARY: Glucose-Capillary: 205 mg/dL — ABNORMAL HIGH (ref 70–99)

## 2013-05-08 MED ORDER — CIPROFLOXACIN IN D5W 400 MG/200ML IV SOLN
400.0000 mg | INTRAVENOUS | Status: DC
  Administered 2013-05-08 – 2013-05-11 (×4): 400 mg via INTRAVENOUS
  Filled 2013-05-08 (×4): qty 200

## 2013-05-08 NOTE — Progress Notes (Signed)
Physical Therapy Treatment Patient Details Name: [name] [name] MRN: [id number] DOB: 1936-11-22 Today's Date: 05/08/2013 Time: 4098-1191 PT Time Calculation (min): 14 min  PT Assessment / Plan / Recommendation Comments on Treatment Session  Pt having increasing difficulty breathing however sitting up in chair and wanted to attempt to work with therapy.  Pt performed therapuetic exercises in chair and pt's HR increased to 130s, Sa02 maintained 93% on 15L and respiratory rate 30s.  Therefore treminated treatment session and will continue to follow up with pt.  RN in room and aware.      Follow Up Recommendations  Home health PT;Supervision for mobility/OOB     Does the patient have the potential to tolerate intense rehabilitation     Barriers to Discharge        Equipment Recommendations  None recommended by PT    Recommendations for Other Services    Frequency Min 3X/week   Plan Discharge plan needs to be updated;Equipment recommendations need to be updated    Precautions / Restrictions Precautions Precautions: Fall   Pertinent Vitals/Pain No c/o pain but does c/o SOB due to RR 30s; Pt currently on partial rebreather 15L.    Mobility  Bed Mobility Bed Mobility: Not assessed Supine to Sit: Not tested (comment) Sitting - Scoot to Edge of Bed: Not tested (comment) Sit to Supine: Not Tested (comment) Transfers Transfers: Not assessed Ambulation/Gait Ambulation/Gait Assistance: Not tested (comment)    Exercises General Exercises - Lower Extremity Ankle Circles/Pumps: Strengthening;Both;10 reps Long Arc Quad: Strengthening;Both;10 reps Hip Flexion/Marching: Strengthening;10 reps;Seated;Both   PT Diagnosis:    PT Problem List:   PT Treatment Interventions:     PT Goals Acute Rehab PT Goals PT Goal Formulation: With patient Time For Goal Achievement: 05/20/13 Potential to Achieve Goals: Good Pt will Perform Home Exercise Program: Independently PT Goal: Perform Home  Exercise Program - Progress: Progressing toward goal  Visit Information  Last PT Received On: 05/08/13 Assistance Needed: +1    Subjective Data  Subjective: "I'm having a hard time catching my breath." Patient Stated Goal: To feel better and go home   Cognition  Cognition Arousal/Alertness: Awake/alert Behavior During Therapy: WFL for tasks assessed/performed Overall Cognitive Status: Within Functional Limits for tasks assessed    Balance     End of Session PT - End of Session Equipment Utilized During Treatment: Oxygen (15L partial rebreather) Activity Tolerance: Patient limited by fatigue Patient left: in chair;with call bell/phone within reach;with family/visitor present Nurse Communication: Mobility status   GP     Ubah Radke 05/08/2013, 8:34 AM Jake Shark, PT DPT [phone]

## 2013-05-08 NOTE — Care Management Note (Signed)
    Page 1 of 2   05/27/2013     3:15:13 PM   CARE MANAGEMENT NOTE 05/27/2013  Patient:  PARAS, KREIDER   Account Number:  [id number]  Date Initiated:  05/05/2013  Documentation initiated by:  Alvira Philips Assessment:   77 yr-old male adm with dx of PNA; lives with spouse, has home O2 and nebs through [place], active with [facility] for RN and PT     Action/Plan:   PT VERY DECONDITIONED.  WILL NEED SNF FOR REHAB.   Anticipated DC Date:  05/21/2013   Anticipated DC Plan:  SKILLED NURSING FACILITY  In-house referral  Clinical Social Worker      DC Planning Services  CM consult      Choice offered to / List presented to:  C-1 Patient           Status of service:  Completed, signed off Medicare Important Message given?   (If response is "NO", the following Medicare IM given date fields will be blank) Date Medicare IM given:   Date Additional Medicare IM given:    Discharge Disposition:  SKILLED NURSING FACILITY  Per UR Regulation:  Reviewed for med. necessity/level of care/duration of stay  If discussed at Long Length of Stay Meetings, dates discussed:   05/12/2013  05/14/2013    Comments:  05/26/13 Vincenzina Jagoda,RN,BSN [phone] PT DISCHARGED TO [facility], PER CSW ARRANGEMENTS.  05/19/13 Gearold Wainer,RN,BSN [phone] PER PHYSICAL THERAPY AND CCM NURSE PRACTITIONER, PT WILL NEED SHORT TERM SNF FOR REHAB PRIOR TO RETURNING HOME DUE TO DECONDITIONING.  PT/FAMILY AGREEABLE, AND DESIRE THE [facility].  WILL CONSULT CSW TO FACILITATE DC TO SNF WHEN MEDICALLY STABLE.  05/15/13 Atara Paterson,RN,BSN [phone] PT ADMITTED TO [facility]; PT/OT RECOMMENDING HOME WITH HH SERVICES AT THIS TIME.  CSW TO FOLLOW FOR SNF AS BACKUP PLAN, IF NEEDED.  5/9 0927 debbie dowell rn,bsn sw following for snf, if goes home he was act w [facility] for rn and pt. will cont to follow.

## 2013-05-08 NOTE — Progress Notes (Signed)
Occupational Therapy Treatment Patient Details Name: [name] [name] MRN: [id number] DOB: 1936/01/23 Today's Date: 05/08/2013 Time: 0865-7846 OT Time Calculation (min): 17 min  OT Assessment / Plan / Recommendation Comments on Treatment Session  Pt on 100% non rebreather, and very fatigued this afternoon - activity very limited.  Pt. With sats to 88% with transfer chair to bed.  Encouraged general exercise and OOB over the weekend    Follow Up Recommendations  Home health OT;Supervision/Assistance - 24 hour (unless medical/respiratory status does not improve)    Barriers to Discharge       Equipment Recommendations  3 in 1 bedside comode    Recommendations for Other Services    Frequency Min 2X/week   Plan Discharge plan remains appropriate    Precautions / Restrictions Precautions Precautions: Fall Restrictions Weight Bearing Restrictions: No   Pertinent Vitals/Pain     ADL  ADL Comments: Pt sitting in recliner with 100% non-rebreather on.  Reports fatigue.  Pt only able to perform transfer chair to bed.  Instructed he and wife on general UE exercises to perform over the weekend and encouraged OOB to chair over the weekened    OT Diagnosis:    OT Problem List:   OT Treatment Interventions:     OT Goals Acute Rehab OT Goals OT Goal Formulation: With patient/family Time For Goal Achievement: 05/20/13 Potential to Achieve Goals: Good ADL Goals Pt Will Perform Grooming: with modified independence;Sitting at sink;Standing at sink;Other (comment) Pt Will Perform Upper Body Bathing: with modified independence;Sit to stand from chair;Sitting at sink Pt Will Perform Lower Body Bathing: with min assist;Sit to stand from chair;Sit to stand from bed ADL Goal: Lower Body Bathing - Progress: Goal set today Pt Will Perform Upper Body Dressing: with modified independence;Sit to stand from chair Pt Will Perform Lower Body Dressing: with min assist;Sit to stand from chair;Sit to stand  from bed ADL Goal: Lower Body Dressing - Progress: Goal set today Pt Will Transfer to Toilet: with modified independence;Ambulation;Comfort height toilet ADL Goal: Toilet Transfer - Progress: Progressing toward goals Additional ADL Goal #1: Pt will participate in 20 mins therapeutic activity with no more than 2 rests and dyspnea 2/4 ADL Goal: Additional Goal #1 - Progress: Goal set today Miscellaneous OT Goals Miscellaneous OT Goal #1: Pt will demonstrate basic transfer with oxygen saturations >90 % to decr fall risk with adls OT Goal: Miscellaneous Goal #1 - Progress: Progressing toward goals  Visit Information  Last OT Received On: 05/08/13 Assistance Needed: +1    Subjective Data      Prior Functioning       Cognition  Cognition Arousal/Alertness: Awake/alert Behavior During Therapy: WFL for tasks assessed/performed Overall Cognitive Status: Within Functional Limits for tasks assessed    Mobility  Bed Mobility Bed Mobility: Sit to Supine;Scooting to HOB Sit to Supine: 4: Min assist Scooting to [facility]: 3: Mod assist Details for Bed Mobility Assistance: Pt very fatigued.  Required assist for LEs Transfers Transfers: Sit to Stand;Stand to Sit Sit to Stand: 4: Min assist;With upper extremity assist;From chair/3-in-1 Stand to Sit: 4: Min assist;With upper extremity assist;To bed Details for Transfer Assistance: Pt required assist due to fatigue    Exercises      Balance Balance Balance Assessed: Yes Static Sitting Balance Static Sitting - Balance Support: Feet supported Static Sitting - Level of Assistance: 5: Stand by assistance   End of Session OT - End of Session Activity Tolerance: Patient limited by fatigue;Treatment limited secondary to medical  complications (Comment) Patient left: in bed;with call bell/phone within reach;with family/visitor present Nurse Communication: Mobility status;Precautions  GO     Jeani Hawking M 05/08/2013, 7:27 PM

## 2013-05-08 NOTE — Progress Notes (Signed)
PULMONARY  / CRITICAL CARE MEDICINE  Name: [name] [name] MRN: [id number] DOB: [birth year]    ADMISSION DATE:  05/04/2013 CONSULTATION DATE:  05/04/2013  REFERRING MD :  Mahala Menghini PRIMARY SERVICE:  Hospitalist  CHIEF COMPLAINT:  hemoptysis  BRIEF PATIENT DESCRIPTION: 77 yo WM with hx of severe COPD, CHF (LVEF 55% to 60% on 4/21), and atrial flutter with recent hospitalization from 4/21-4/30 that is on Xarelto presents with hemoptysis and increasing sob X 1 day,  PCCM consulted for hemoptysis  SIGNIFICANT EVENTS / STUDIES:  5/04 - admitted with hemoptysis  5/7 CT chest: dense LLL consolidation, no evidence of tumor or mass 5/8 early AM - transferred to SDU with increased SOB 5/9 increased dyspnea and worsening hypoxemia  LINES / TUBES:   CULTURES: MRSA PCR 5/5 >> NEG Blood 5/5 >> NEG Resp 5/6 >> mod candida Strep Ag 5/6 >> NEG Legionella 5/6 >> NEG  ANTIBIOTICS: 5/5 Cefepime>>>5/5 5/5 Vanco>> 5/9 5/5 Zoysn>>> .  SUBJECTIVE: Presently only mildly increased WOB on NRB. Cognition intact.   VITAL SIGNS: Temp:  [98.3 F (36.8 C)-99 F (37.2 C)] 99 F (37.2 C) (05/09 1136) Pulse Rate:  [73-104] 80 (05/09 1215) Resp:  [18-28] 24 (05/09 1215) BP: (73-139)/(30-57) 101/44 mmHg (05/09 1215) SpO2:  [83 %-100 %] 99 % (05/09 1305) FiO2 (%):  [50 %-100 %] 100 % (05/09 1305)  PHYSICAL EXAMINATION: General:  Pleasant, NAD Neuro:  No focal deficits HEENT:  WNL Cardiovascular: IRIR s M Lungs: diminished BS with bronchial quality in LLL, R basilar crackles, no wheezes.  Abdomen:  Soft, nontender Ext: No clubbing, minimal ankle edema   Recent Labs Lab 05/05/13 0500 05/07/13 1100 05/08/13 0430  NA 134* 132* 134*  K 4.7 4.7 4.4  CL 98 92* 94*  CO2 26 31 32  BUN 36* 40* 45*  CREATININE 1.35 1.60* 1.62*  GLUCOSE 335* 316* 216*    Recent Labs Lab 05/05/13 0500 05/07/13 1100 05/08/13 0430  HGB 8.3* 7.6* 7.6*  HCT 24.5* 22.9* 23.2*  WBC 29.9* 36.5* 38.2*  PLT 135* 126*  117*   Dg Chest Port 1 View  05/08/2013  *RADIOLOGY REPORT*  Clinical Data: Follow up respiratory failure  PORTABLE CHEST - 1 VIEW  Comparison: Prior chest x-ray 05/07/2013  Findings: Stable to slightly improved left mid and lower lung interstitial and airspace disease.  Mild cardiomegaly.  Status post median sternotomy.  No pneumothorax or enlarging effusion. Small left effusion appears stable.  No acute osseous abnormality.  IMPRESSION:  Slight radiographic improvement in the left lower lobe infiltrate.  Stable small left effusion.   Original Report Authenticated By: Malachy Moan, M.D.    Dg Chest Port 1 View  05/07/2013  *RADIOLOGY REPORT*  Clinical Data: 77 year old male with respiratory distress, shortness of breath.  PORTABLE CHEST - 1 VIEW  Comparison: 05/06/2013 and earlier.  Findings: AP portable semi upright view at 0435 hours.  Since 04/05/2013, widespread airspace disease at the left lower and midlung has progressed.  Increased left lung base density and obscuration of the left hemidiaphragm.  The right lung is stable.  Stable cardiac size and mediastinal contours.  Visualized tracheal air column is within normal limits.  IMPRESSION: Radiographically progressed left lung pneumonia since 05/05/2013. Dense left lower lobe consolidation.   Original Report Authenticated By: Erskine Speed, M.D.    ASSESSMENT / PLAN:  PULMONARY A:  Hemoptysis in setting of possible HCAP, improved to resolved Severe COPD - no active wheezing. Acute on chronic respiratory failure -  baseline nocturnal O2 dep Dense LLL consolidation - improving aeration 5/09 P: Cont holding anticoagulation Cont BDs Titrate FiO2 to maintain SpO2 > 90% No indication for FOB @ this time Needs to remain in SDU   CARDIOLOGY A: A fib, marginal rate control P: Cont curent Rx Could consider PRBCs if rate control becomes problematic  RENAL A:  Acute renal insuff P: Monitor BMET Avoid nephrotoxins and volume depletion  (Lasix D/C'd)  INFECTIOUS DISEASE A: Presumed LLL PNA, NOS No evidence of resistant GPC P: D/C Vanc Micro and abx as above   HEMATOLOGY A:  Anemia of acute illness. Leukocytosis likely from steroid use and SIRS/Sepsis. Thrombocytopenia P: - F/U CBC.  ENDOCRINE A:  Hyperglycemia P: Cont SSI   Billy Fischer, MD ; [facility] [phone].  After 5:30 PM or weekends, call [phone]

## 2013-05-09 ENCOUNTER — Inpatient Hospital Stay (HOSPITAL_COMMUNITY): Payer: Medicare Other

## 2013-05-09 LAB — CBC
MCH: 30 pg (ref 26.0–34.0)
MCV: 91.2 fL (ref 78.0–100.0)
Platelets: 127 10*3/uL — ABNORMAL LOW (ref 150–400)
RBC: 2.73 MIL/uL — ABNORMAL LOW (ref 4.22–5.81)
RDW: 17.3 % — ABNORMAL HIGH (ref 11.5–15.5)
WBC: 51 10*3/uL (ref 4.0–10.5)

## 2013-05-09 LAB — GLUCOSE, CAPILLARY
Glucose-Capillary: 189 mg/dL — ABNORMAL HIGH (ref 70–99)
Glucose-Capillary: 436 mg/dL — ABNORMAL HIGH (ref 70–99)

## 2013-05-09 LAB — COMPREHENSIVE METABOLIC PANEL
ALT: 16 U/L (ref 0–53)
AST: 14 U/L (ref 0–37)
Albumin: 1.9 g/dL — ABNORMAL LOW (ref 3.5–5.2)
Calcium: 8.4 mg/dL (ref 8.4–10.5)
Creatinine, Ser: 1.9 mg/dL — ABNORMAL HIGH (ref 0.50–1.35)
Sodium: 132 mEq/L — ABNORMAL LOW (ref 135–145)

## 2013-05-09 LAB — EXPECTORATED SPUTUM ASSESSMENT W GRAM STAIN, RFLX TO RESP C

## 2013-05-09 LAB — GLUCOSE, RANDOM: Glucose, Bld: 474 mg/dL — ABNORMAL HIGH (ref 70–99)

## 2013-05-09 MED ORDER — BISACODYL 10 MG RE SUPP
10.0000 mg | Freq: Every day | RECTAL | Status: DC | PRN
  Administered 2013-05-25: 10 mg via RECTAL
  Filled 2013-05-09 (×2): qty 1

## 2013-05-09 MED ORDER — MAGNESIUM HYDROXIDE 400 MG/5ML PO SUSP
30.0000 mL | Freq: Every day | ORAL | Status: DC | PRN
  Administered 2013-05-09 – 2013-05-25 (×6): 30 mL via ORAL
  Filled 2013-05-09 (×6): qty 30

## 2013-05-09 MED ORDER — INSULIN ASPART 100 UNIT/ML ~~LOC~~ SOLN
0.0000 [IU] | Freq: Every day | SUBCUTANEOUS | Status: DC
  Administered 2013-05-10: 3 [IU] via SUBCUTANEOUS
  Administered 2013-05-11: 2 [IU] via SUBCUTANEOUS

## 2013-05-09 MED ORDER — INSULIN ASPART 100 UNIT/ML ~~LOC~~ SOLN
0.0000 [IU] | Freq: Three times a day (TID) | SUBCUTANEOUS | Status: DC
  Administered 2013-05-10: 11 [IU] via SUBCUTANEOUS
  Administered 2013-05-10 – 2013-05-11 (×4): 7 [IU] via SUBCUTANEOUS
  Administered 2013-05-11: 20 [IU] via SUBCUTANEOUS
  Administered 2013-05-12: 7 [IU] via SUBCUTANEOUS

## 2013-05-09 MED ORDER — FLUTICASONE PROPIONATE HFA 110 MCG/ACT IN AERO
1.0000 | INHALATION_SPRAY | Freq: Two times a day (BID) | RESPIRATORY_TRACT | Status: DC
  Administered 2013-05-09 – 2013-05-11 (×4): 1 via RESPIRATORY_TRACT
  Filled 2013-05-09: qty 12

## 2013-05-09 NOTE — Progress Notes (Signed)
Pt. Refused cpt with the vest, but agreed to perform the flutter valve.

## 2013-05-09 NOTE — Progress Notes (Signed)
PULMONARY  / CRITICAL CARE MEDICINE  Name: [name] [name] MRN: [id number] DOB: Feb 15, 1936    ADMISSION DATE:  05/04/2013 CONSULTATION DATE:  05/04/2013  REFERRING MD :  Mahala Menghini PRIMARY SERVICE:  Hospitalist  CHIEF COMPLAINT:  hemoptysis  BRIEF PATIENT DESCRIPTION: 77 yo WM with hx of severe COPD, CHF (LVEF 55% to 60% on 4/21), and atrial flutter with recent hospitalization from 4/21-4/30 that is on Xarelto presents with hemoptysis and increasing sob X 1 day,  PCCM consulted for hemoptysis  SIGNIFICANT EVENTS / STUDIES:  5/04 - admitted with hemoptysis  5/7 CT chest: dense LLL consolidation, no evidence of tumor or mass 5/8 early AM - transferred to SDU with increased SOB 5/9 increased dyspnea and worsening hypoxemia  LINES / TUBES:   CULTURES: MRSA PCR 5/5 >> NEG Blood 5/5 >> NEG Resp 5/6 >> mod candida Strep Ag 5/6 >> NEG Legionella 5/6 >> NEG  ANTIBIOTICS: 5/5 Cefepime>>>5/5 5/5 Vanco>> 5/9 5/5 Zoysn>>> cipro 5/9 >> .  SUBJECTIVE: Presently only mildly increased WOB on NRB. oob to chair Trace hemoptysis overnight Cognition intact.  C/o constipation  VITAL SIGNS: Temp:  [97.5 F (36.4 C)-99 F (37.2 C)] 97.5 F (36.4 C) (05/10 0730) Pulse Rate:  [75-119] 97 (05/10 0730) Resp:  [21-27] 21 (05/10 0730) BP: (73-168)/(30-90) 131/47 mmHg (05/10 0730) SpO2:  [87 %-100 %] 97 % (05/10 0730) FiO2 (%):  [100 %] 100 % (05/09 1305)  PHYSICAL EXAMINATION: General:  Pleasant, NAD Neuro:  No focal deficits HEENT:  WNL Cardiovascular: IRIR s M Lungs: diminished BS with bronchial quality in LLL, R basilar crackles, no wheezes.  Abdomen:  Soft, nontender Ext: No clubbing, minimal ankle edema   Recent Labs Lab 05/07/13 1100 05/08/13 0430 05/09/13 0419  NA 132* 134* 132*  K 4.7 4.4 4.3  CL 92* 94* 90*  CO2 31 32 31  BUN 40* 45* 55*  CREATININE 1.60* 1.62* 1.90*  GLUCOSE 316* 216* 200*    Recent Labs Lab 05/07/13 1100 05/08/13 0430 05/09/13 0419  HGB 7.6*  7.6* 8.2*  HCT 22.9* 23.2* 24.9*  WBC 36.5* 38.2* 51.0*  PLT 126* 117* 127*   Dg Chest Port 1 View  05/09/2013  *RADIOLOGY REPORT*  Clinical Data: Respiratory failure.  PORTABLE CHEST - 1 VIEW  Comparison: 05/08/2013  Findings: Stable left lower lung infiltrate.  No pulmonary edema is identified.  Right lung expansion improved.  Stable cardiomegaly.  IMPRESSION: Stable left lower lung infiltrate.  Improved aeration of the right lung.   Original Report Authenticated By: Irish Lack, M.D.    Dg Chest Port 1 View  05/08/2013  *RADIOLOGY REPORT*  Clinical Data: Follow up respiratory failure  PORTABLE CHEST - 1 VIEW  Comparison: Prior chest x-ray 05/07/2013  Findings: Stable to slightly improved left mid and lower lung interstitial and airspace disease.  Mild cardiomegaly.  Status post median sternotomy.  No pneumothorax or enlarging effusion. Small left effusion appears stable.  No acute osseous abnormality.  IMPRESSION:  Slight radiographic improvement in the left lower lobe infiltrate.  Stable small left effusion.   Original Report Authenticated By: Malachy Moan, M.D.    ASSESSMENT / PLAN:  PULMONARY A:  Hemoptysis in setting of possible HCAP, improved to resolved Severe COPD - no active wheezing. Acute on chronic respiratory failure - baseline nocturnal O2 dep Dense LLL consolidation - improving aeration 5/09 Mediastinal Ln - P: Cont holding anticoagulation Cont BDs Dial down FiO2 to maintain SpO2 > 90% No indication for FOB @ this time, will  need FU CT in 13m after resolution of infx to fu mediastinal Ln Needs to remain in SDU   CARDIOLOGY A: A fib, marginal rate control P: Cont curent Rx Could consider PRBCs if rate control becomes problematic  RENAL A:  Acute renal insuff P: Monitor BMET Avoid nephrotoxins and volume depletion (Lasix D/C'd)  INFECTIOUS DISEASE A: Presumed LLL PNA, NOS Leukemoid reaction -? Steroids,  no reason to suspect C diff, infact  constipated P: D/C Vanc Micro and abx as above   HEMATOLOGY A:  Anemia of acute illness. Leukocytosis likely from steroid use and SIRS/Sepsis. Thrombocytopenia P: - F/U CBC.  ENDOCRINE A:  Hyperglycemia P: Cont SSI  Pt & Wife updated  Cyril Mourning MD. FCCP. [facility] Pager [phone] If no response call [phone]

## 2013-05-09 NOTE — Progress Notes (Signed)
WBC 51.0 MD was made aware.

## 2013-05-10 LAB — CBC WITH DIFFERENTIAL/PLATELET
Band Neutrophils: 0 % (ref 0–10)
Eosinophils Absolute: 0 10*3/uL (ref 0.0–0.7)
Eosinophils Relative: 0 % (ref 0–5)
HCT: 23.6 % — ABNORMAL LOW (ref 39.0–52.0)
Hemoglobin: 7.7 g/dL — ABNORMAL LOW (ref 13.0–17.0)
MCV: 91.5 fL (ref 78.0–100.0)
Metamyelocytes Relative: 0 %
Monocytes Absolute: 3.7 10*3/uL — ABNORMAL HIGH (ref 0.1–1.0)
Monocytes Relative: 10 % (ref 3–12)
RBC: 2.58 MIL/uL — ABNORMAL LOW (ref 4.22–5.81)
WBC: 36.6 10*3/uL — ABNORMAL HIGH (ref 4.0–10.5)
nRBC: 0 /100 WBC

## 2013-05-10 LAB — CULTURE, RESPIRATORY W GRAM STAIN

## 2013-05-10 LAB — CULTURE, BLOOD (ROUTINE X 2)
Culture: NO GROWTH
Culture: NO GROWTH

## 2013-05-10 LAB — BASIC METABOLIC PANEL
BUN: 65 mg/dL — ABNORMAL HIGH (ref 6–23)
GFR calc Af Amer: 39 mL/min — ABNORMAL LOW (ref >90)
GFR calc non Af Amer: 34 mL/min — ABNORMAL LOW (ref >90)
Potassium: 4.4 mEq/L (ref 3.5–5.1)
Sodium: 132 mEq/L — ABNORMAL LOW (ref 135–145)

## 2013-05-10 MED ORDER — MINERAL OIL PO OIL
TOPICAL_OIL | Freq: Every day | ORAL | Status: DC | PRN
  Administered 2013-05-10: 30 mL via ORAL
  Filled 2013-05-10: qty 30

## 2013-05-10 MED ORDER — TRIMETHOBENZAMIDE HCL 300 MG PO CAPS
300.0000 mg | ORAL_CAPSULE | Freq: Once | ORAL | Status: AC
  Administered 2013-05-10: 300 mg via ORAL
  Filled 2013-05-10: qty 1

## 2013-05-10 MED ORDER — MAGNESIUM CITRATE PO SOLN
1.0000 | Freq: Once | ORAL | Status: AC
  Administered 2013-05-10: 1 via ORAL
  Filled 2013-05-10: qty 296

## 2013-05-10 MED ORDER — FLEET ENEMA 7-19 GM/118ML RE ENEM
1.0000 | ENEMA | Freq: Every day | RECTAL | Status: DC | PRN
  Administered 2013-05-10: 1 via RECTAL
  Filled 2013-05-10 (×2): qty 1

## 2013-05-10 NOTE — Progress Notes (Signed)
PULMONARY  / CRITICAL CARE MEDICINE  Name: [name] [name] MRN: [id number] DOB: [birth year]    ADMISSION DATE:  05/04/2013 CONSULTATION DATE:  05/04/2013  REFERRING MD :  Mahala Menghini PRIMARY SERVICE:  Hospitalist  CHIEF COMPLAINT:  hemoptysis  BRIEF PATIENT DESCRIPTION: 77 yo WM with hx of severe COPD, CHF (LVEF 55% to 60% on 4/21), and atrial flutter with recent hospitalization from 4/21-4/30 that is on Xarelto presents with hemoptysis and increasing sob X 1 day,  PCCM consulted for hemoptysis  SIGNIFICANT EVENTS / STUDIES:  5/04 - admitted with hemoptysis  5/7 CT chest: dense LLL consolidation, no evidence of tumor or mass 5/8 early AM - transferred to SDU with increased SOB 5/9 increased dyspnea and worsening hypoxemia  LINES / TUBES:   CULTURES: MRSA PCR 5/5 >> NEG Blood 5/5 >> NEG Resp 5/6 >> mod candida Strep Ag 5/6 >> NEG Legionella 5/6 >> NEG  ANTIBIOTICS: 5/5 Cefepime>>>5/5 5/5 Vanco>> 5/9 5/5 Zoysn>>> cipro 5/9 >> .  SUBJECTIVE:  Feels better. Biggest c/o constipation  VITAL SIGNS: Temp:  [97.5 F (36.4 C)-99.3 F (37.4 C)] 98.3 F (36.8 C) (05/11 0305) Pulse Rate:  [75-111] 94 (05/11 0800) Resp:  [19-29] 21 (05/11 0800) BP: (104-134)/(29-55) 104/55 mmHg (05/11 0800) SpO2:  [87 %-96 %] 94 % (05/11 0800) 6 liters  PHYSICAL EXAMINATION: General:  Pleasant, NAD Neuro:  No focal deficits HEENT:  WNL Cardiovascular: IRIR s M Lungs: diminished BS with bronchial quality in LLL, R basilar crackles, no wheezes. Some WOB Abdomen:  Soft, nontender, tympanic perc  Ext: No clubbing, minimal ankle edema   Recent Labs Lab 05/07/13 1100 05/08/13 0430 05/09/13 0419 05/09/13 1439  NA 132* 134* 132*  --   K 4.7 4.4 4.3  --   CL 92* 94* 90*  --   CO2 31 32 31  --   BUN 40* 45* 55*  --   CREATININE 1.60* 1.62* 1.90*  --   GLUCOSE 316* 216* 200* 474*    Recent Labs Lab 05/07/13 1100 05/08/13 0430 05/09/13 0419  HGB 7.6* 7.6* 8.2*  HCT 22.9* 23.2* 24.9*  WBC  36.5* 38.2* 51.0*  PLT 126* 117* 127*   Dg Chest Port 1 View  05/09/2013  *RADIOLOGY REPORT*  Clinical Data: Respiratory failure.  PORTABLE CHEST - 1 VIEW  Comparison: 05/08/2013  Findings: Stable left lower lung infiltrate.  No pulmonary edema is identified.  Right lung expansion improved.  Stable cardiomegaly.  IMPRESSION: Stable left lower lung infiltrate.  Improved aeration of the right lung.   Original Report Authenticated By: Irish Lack, M.D.    ASSESSMENT / PLAN:  PULMONARY A:  Hemoptysis in setting of possible HCAP, improved to resolved Severe COPD - no active wheezing. Acute on chronic respiratory failure - baseline nocturnal O2 dep Dense LLL consolidation - improving aeration 5/09 Mediastinal Ln - P: Cont holding anticoagulation Cont BDs Dial down FiO2 to maintain SpO2 > 90% No indication for FOB @ this time, will need FU CT in 55m after resolution of infx to fu mediastinal Ln Needs to remain in SDU F/u cxr  CARDIOLOGY A: A fib, marginal rate control P: Cont curent Rx Could consider PRBCs if rate control becomes problematic  RENAL A:  Acute renal insuff P: Monitor BMET, ordered for 5/11 Avoid nephrotoxins and volume depletion (Lasix D/C'd)  INFECTIOUS DISEASE A: Presumed LLL PNA, NOS Leukemoid reaction -? Steroids,  no reason to suspect C diff, infact constipated P: Micro and abx as above  GI A:  Constipation P: LOC   HEMATOLOGY A:  Anemia of acute illness. Leukocytosis likely from steroid use and SIRS/Sepsis. Thrombocytopenia P: - F/U CBC.  ENDOCRINE A:  Hyperglycemia P: Cont SSI   Cyril Mourning MD. FCCP.  Pulmonary [facility] Critical care Pager [phone] If no response call [phone]

## 2013-05-10 NOTE — Progress Notes (Signed)
Pt. Refused cpt with the vest. Pt. States he has an upset stomach. Pt. Performed the flutter valve.

## 2013-05-10 NOTE — Progress Notes (Signed)
Pt having nausea and vomiting.  elink MD notified. VS stable.  New orders received. Will continue to monitor. Emilie [place]

## 2013-05-10 NOTE — Progress Notes (Signed)
ANTIBIOTIC CONSULT NOTE - FOLLOW UP  Pharmacy Consult for zosyn Indication: pneumonia  No Known Allergies  Patient Measurements: Height: 5\' 6"  (167.6 cm) Weight:  (unable to obtain. bed not zeroed, pt unable to stand) IBW/kg (Calculated) : 63.8   Vital Signs: Temp: 98.3 F (36.8 C) (05/11 0305) Temp src: Oral (05/11 0305) BP: 104/55 mmHg (05/11 0800) Pulse Rate: 94 (05/11 0800) Intake/Output from previous day: 05/10 0701 - 05/11 0700 In: 1352.5 [P.O.:800; I.V.:240; IV Piggyback:312.5] Out: 1775 [Urine:1775] Intake/Output from this shift: Total I/O In: 37.5 [IV Piggyback:37.5] Out: -   Labs:  Recent Labs  05/08/13 0430 05/09/13 0419  WBC 38.2* 51.0*  HGB 7.6* 8.2*  PLT 117* 127*  CREATININE 1.62* 1.90*   Estimated Creatinine Clearance: 33.4 ml/min (by C-G formula based on Cr of 1.9). No results found for this basename: VANCOTROUGH, Leodis Binet, VANCORANDOM, GENTTROUGH, GENTPEAK, GENTRANDOM, TOBRATROUGH, TOBRAPEAK, TOBRARND, AMIKACINPEAK, AMIKACINTROU, AMIKACIN,  in the last 72 hours   Microbiology: Recent Results (from the past 720 hour(s))  MRSA PCR SCREENING     Status: None   Collection Time    04/19/13  3:08 PM      Result Value Range Status   MRSA by PCR NEGATIVE  NEGATIVE Final   Comment:            The GeneXpert MRSA Assay (FDA     approved for NASAL specimens     only), is one component of a     comprehensive MRSA colonization     surveillance program. It is not     intended to diagnose MRSA     infection nor to guide or     monitor treatment for     MRSA infections.  MRSA PCR SCREENING     Status: None   Collection Time    05/04/13  1:45 PM      Result Value Range Status   MRSA by PCR NEGATIVE  NEGATIVE Final   Comment:            The GeneXpert MRSA Assay (FDA     approved for NASAL specimens     only), is one component of a     comprehensive MRSA colonization     surveillance program. It is not     intended to diagnose MRSA     infection  nor to guide or     monitor treatment for     MRSA infections.  CULTURE, BLOOD (ROUTINE X 2)     Status: None   Collection Time    05/04/13  2:00 PM      Result Value Range Status   Specimen Description BLOOD ARM LEFT   Final   Special Requests BOTTLES DRAWN AEROBIC AND ANAEROBIC 10CC   Final   Culture  Setup Time 05/04/2013 21:33   Final   Culture     Final   Value:        BLOOD CULTURE RECEIVED NO GROWTH TO DATE CULTURE WILL BE HELD FOR 5 DAYS BEFORE ISSUING A FINAL NEGATIVE REPORT   Report Status PENDING   Incomplete  CULTURE, BLOOD (ROUTINE X 2)     Status: None   Collection Time    05/04/13  2:15 PM      Result Value Range Status   Specimen Description BLOOD HAND LEFT   Final   Special Requests BOTTLES DRAWN AEROBIC AND ANAEROBIC 10CC   Final   Culture  Setup Time 05/04/2013 21:32   Final   Culture  Final   Value:        BLOOD CULTURE RECEIVED NO GROWTH TO DATE CULTURE WILL BE HELD FOR 5 DAYS BEFORE ISSUING A FINAL NEGATIVE REPORT   Report Status PENDING   Incomplete  CULTURE, EXPECTORATED SPUTUM-ASSESSMENT     Status: None   Collection Time    05/05/13  9:40 AM      Result Value Range Status   Specimen Description SPUTUM   Final   Special Requests Normal   Final   Sputum evaluation     Final   Value: THIS SPECIMEN IS ACCEPTABLE. RESPIRATORY CULTURE REPORT TO FOLLOW.   Report Status 05/05/2013 FINAL   Final  CULTURE, RESPIRATORY (NON-EXPECTORATED)     Status: None   Collection Time    05/05/13  9:40 AM      Result Value Range Status   Specimen Description SPUTUM   Final   Special Requests NONE   Final   Gram Stain     Final   Value: MODERATE WBC PRESENT, PREDOMINANTLY PMN     RARE SQUAMOUS EPITHELIAL CELLS PRESENT     RARE GRAM POSITIVE COCCI     IN PAIRS RARE YEAST   Culture     Final   Value: MODERATE CANDIDA ALBICANS     RARE FUNGUS (MOLD) ISOLATED, PROBABLE CONTAMINANT/COLONIZER (SAPROPHYTE). CONTACT MICROBIOLOGY IF FURTHER IDENTIFICATION REQUIRED  [phone].   Report Status 05/07/2013 FINAL   Final  CULTURE, EXPECTORATED SPUTUM-ASSESSMENT     Status: None   Collection Time    05/08/13 11:00 AM      Result Value Range Status   Specimen Description SPUTUM   Final   Special Requests Normal   Final   Sputum evaluation     Final   Value: THIS SPECIMEN IS ACCEPTABLE. RESPIRATORY CULTURE REPORT TO FOLLOW.   Report Status 05/08/2013 FINAL   Final  CULTURE, RESPIRATORY (NON-EXPECTORATED)     Status: None   Collection Time    05/08/13 11:00 AM      Result Value Range Status   Specimen Description SPUTUM   Final   Special Requests NONE   Final   Gram Stain     Final   Value: NO WBC SEEN     NO SQUAMOUS EPITHELIAL CELLS SEEN     NO ORGANISMS SEEN   Culture     Final   Value: FEW CANDIDA ALBICANS     RARE FUNGUS (MOLD) ISOLATED, PROBABLE CONTAMINANT/COLONIZER (SAPROPHYTE). CONTACT MICROBIOLOGY IF FURTHER IDENTIFICATION REQUIRED [phone].   Report Status 05/10/2013 FINAL   Final  CULTURE, EXPECTORATED SPUTUM-ASSESSMENT     Status: None   Collection Time    05/09/13  3:05 PM      Result Value Range Status   Specimen Description SPUTUM   Final   Special Requests Normal   Final   Sputum evaluation     Final   Value: THIS SPECIMEN IS ACCEPTABLE. RESPIRATORY CULTURE REPORT TO FOLLOW.   Report Status 05/09/2013 FINAL   Final  CULTURE, RESPIRATORY (NON-EXPECTORATED)     Status: None   Collection Time    05/09/13  3:05 PM      Result Value Range Status   Specimen Description SPUTUM   Final   Special Requests NONE   Final   Gram Stain     Final   Value: MODERATE WBC PRESENT, PREDOMINANTLY PMN     FEW SQUAMOUS EPITHELIAL CELLS PRESENT     MODERATE YEAST   Culture MODERATE CANDIDA ALBICANS   Final  Report Status PENDING   Incomplete    Anti-infectives   Start     Dose/Rate Route Frequency Ordered Stop   05/08/13 1100  ciprofloxacin (CIPRO) IVPB 400 mg     400 mg 200 mL/hr over 60 Minutes Intravenous Every 24 hours 05/08/13  1000     05/07/13 2200  vancomycin (VANCOCIN) 500 mg in sodium chloride 0.9 % 100 mL IVPB  Status:  Discontinued     500 mg 100 mL/hr over 60 Minutes Intravenous Every 12 hours 05/07/13 1159 05/08/13 1000   05/04/13 2200  vancomycin (VANCOCIN) IVPB 750 mg/150 ml premix  Status:  Discontinued     750 mg 150 mL/hr over 60 Minutes Intravenous Every 12 hours 05/04/13 1416 05/07/13 1159   05/04/13 1530  piperacillin-tazobactam (ZOSYN) IVPB 3.375 g  Status:  Discontinued     3.375 g 12.5 mL/hr over 240 Minutes Intravenous 4 times per day 05/04/13 1441 05/04/13 1502   05/04/13 1515  piperacillin-tazobactam (ZOSYN) IVPB 3.375 g     3.375 g 12.5 mL/hr over 240 Minutes Intravenous 3 times per day 05/04/13 1502     05/04/13 1400  ceFEPIme (MAXIPIME) 1 g in dextrose 5 % 50 mL IVPB  Status:  Discontinued     1 g 100 mL/hr over 30 Minutes Intravenous 3 times per day 05/04/13 1339 05/04/13 1441   05/04/13 1000  vancomycin (VANCOCIN) IVPB 1000 mg/200 mL premix     1,000 mg 200 mL/hr over 60 Minutes Intravenous  Once 05/04/13 0951 05/04/13 1109   05/04/13 1000  piperacillin-tazobactam (ZOSYN) IVPB 3.375 g     3.375 g 12.5 mL/hr over 240 Minutes Intravenous  Once 05/04/13 7425 05/04/13 1109      Assessment: 77 year old male admitted with hemoptysis on xarelto and possible HCAP,  s/p treatment with cefepime and  Vancomycin,  to continue on zosyn and cipro. Resp cx: mod candida, WBC 51.0 afebrile, est crcl 33 ml/min  Plan:  1. Continue Zosyn 3.375 IV q8h - 4 hr infusion 2. Continue Cipro 400 mg IV q24h 3. Monitor renal function and clinical progression  Bola A. Wandra Feinstein D Clinical Pharmacist Pager:[phone] Phone [phone] 05/10/2013 12:05 PM

## 2013-05-11 ENCOUNTER — Inpatient Hospital Stay (HOSPITAL_COMMUNITY): Payer: Medicare Other

## 2013-05-11 LAB — GLUCOSE, CAPILLARY
Glucose-Capillary: 15 mg/dL — CL (ref 70–99)
Glucose-Capillary: 212 mg/dL — ABNORMAL HIGH (ref 70–99)

## 2013-05-11 LAB — CBC
HCT: 22 % — ABNORMAL LOW (ref 39.0–52.0)
MCH: 30.1 pg (ref 26.0–34.0)
MCHC: 32.7 g/dL (ref 30.0–36.0)
MCV: 92.1 fL (ref 78.0–100.0)
RDW: 17.6 % — ABNORMAL HIGH (ref 11.5–15.5)

## 2013-05-11 LAB — CULTURE, RESPIRATORY W GRAM STAIN

## 2013-05-11 LAB — BASIC METABOLIC PANEL
BUN: 64 mg/dL — ABNORMAL HIGH (ref 6–23)
Chloride: 92 mEq/L — ABNORMAL LOW (ref 96–112)
Creatinine, Ser: 1.86 mg/dL — ABNORMAL HIGH (ref 0.50–1.35)
GFR calc Af Amer: 39 mL/min — ABNORMAL LOW (ref >90)
Glucose, Bld: 216 mg/dL — ABNORMAL HIGH (ref 70–99)

## 2013-05-11 MED ORDER — ENOXAPARIN SODIUM 30 MG/0.3ML ~~LOC~~ SOLN
30.0000 mg | SUBCUTANEOUS | Status: DC
  Administered 2013-05-11 – 2013-05-21 (×11): 30 mg via SUBCUTANEOUS
  Filled 2013-05-11 (×11): qty 0.3

## 2013-05-11 MED ORDER — FLUCONAZOLE 200 MG PO TABS
200.0000 mg | ORAL_TABLET | Freq: Every day | ORAL | Status: DC
  Administered 2013-05-11 – 2013-05-12 (×2): 200 mg via ORAL
  Filled 2013-05-11 (×2): qty 1

## 2013-05-11 MED ORDER — ASPIRIN 81 MG PO CHEW
81.0000 mg | CHEWABLE_TABLET | Freq: Every day | ORAL | Status: DC
  Administered 2013-05-11 – 2013-05-26 (×15): 81 mg via ORAL
  Filled 2013-05-11 (×15): qty 1

## 2013-05-11 MED ORDER — GLYBURIDE 2.5 MG PO TABS
2.5000 mg | ORAL_TABLET | Freq: Two times a day (BID) | ORAL | Status: DC
  Administered 2013-05-11 – 2013-05-12 (×2): 2.5 mg via ORAL
  Filled 2013-05-11 (×4): qty 1

## 2013-05-11 MED ORDER — LEVOFLOXACIN 500 MG PO TABS
500.0000 mg | ORAL_TABLET | Freq: Every day | ORAL | Status: DC
  Administered 2013-05-11: 500 mg via ORAL
  Filled 2013-05-11 (×2): qty 1

## 2013-05-11 NOTE — Progress Notes (Signed)
Physical Therapy Treatment Patient Details Name: [name] [name] MRN: [id number] DOB: [birth year] Today's Date: 05/11/2013 Time: 6578-4696 PT Time Calculation (min): 24 min  PT Assessment / Plan / Recommendation Comments on Treatment Session  Pt progressing towards all goals but con't to have decreased activity tolerance. Pt SpO2 dropped into 70s% with ther ex and ambulation but quickly recovered back into 90s. Pt con't to be motivated to con't to progress mobiltiy and independence. Pt to con't to benefit from HHPT and 24/7 assist for safe d/c home.    Follow Up Recommendations  Home health PT;Supervision for mobility/OOB     Does the patient have the potential to tolerate intense rehabilitation     Barriers to Discharge        Equipment Recommendations  Rolling walker with 5" wheels    Recommendations for Other Services    Frequency Min 3X/week   Plan Discharge plan needs to be updated;Equipment recommendations need to be updated    Precautions / Restrictions Precautions Precautions: Fall Restrictions Weight Bearing Restrictions: No   Pertinent Vitals/Pain Pt denies pain    Mobility  Bed Mobility Bed Mobility: Not assessed Transfers Transfers: Sit to Stand;Stand to Sit Sit to Stand: 4: Min assist;With upper extremity assist;From chair/3-in-1 Stand to Sit: 4: Min assist;With upper extremity assist;To bed Details for Transfer Assistance: increased time Ambulation/Gait Ambulation/Gait Assistance: 4: Min assist Ambulation Distance (Feet): 50 Feet (x2) Assistive device: Rolling walker Ambulation/Gait Assistance Details: pt on 50% venti mask, SpO2 dropped to 76%, s/p 1 min rest break SpO2 up to 91%. Pt amb limited by decreased endurance, onset of fatigue and SOB. pt required 5 min rest breaks between walking segments, pt with good stability with RW Gait Pattern: Step-through pattern;Decreased stride length Gait velocity: slow General Gait Details: no episodes of LOB     Exercises Total Joint Exercises Ankle Circles/Pumps: Both;10 reps Long Arc Quad: AROM;Both;20 reps;Seated General Exercises - Lower Extremity Hip Flexion/Marching: Strengthening;10 reps;Seated;Both   PT Diagnosis:    PT Problem List:   PT Treatment Interventions:     PT Goals Acute Rehab PT Goals PT Goal: Ambulate - Progress: Progressing toward goal PT Goal: Perform Home Exercise Program - Progress: Progressing toward goal  Visit Information  Last PT Received On: 05/11/13 Assistance Needed: +1    Subjective Data  Subjective: Pt received sitting up in chair agreeable to PT.   Cognition  Cognition Arousal/Alertness: Awake/alert Behavior During Therapy: WFL for tasks assessed/performed Overall Cognitive Status: Within Functional Limits for tasks assessed    Balance     End of Session PT - End of Session Equipment Utilized During Treatment: Oxygen Activity Tolerance: Patient limited by fatigue Patient left: in chair;with call bell/phone within reach;with family/visitor present Nurse Communication: Mobility status   GP     Marcene Brawn 05/11/2013, 2:25 PM  Lewis Shock, PT, DPT Pager #: [phone] Office #: [phone]

## 2013-05-11 NOTE — Progress Notes (Signed)
PULMONARY  / CRITICAL CARE MEDICINE  Name: [name] [name] MRN: [id number] DOB: 11-Mar-1936    ADMISSION DATE:  05/04/2013 CONSULTATION DATE:  05/04/2013  REFERRING MD :  Mahala Menghini PRIMARY SERVICE:  Hospitalist  CHIEF COMPLAINT:  hemoptysis  BRIEF PATIENT DESCRIPTION: 77 yo WM with hx of severe COPD, CHF (LVEF 55% to 60% on 4/21), and atrial flutter with recent hospitalization from 4/21-4/30 that is on Xarelto presents with hemoptysis and increasing sob X 1 day,  PCCM consulted for hemoptysis  SIGNIFICANT EVENTS / STUDIES:  5/04 - admitted with hemoptysis  5/7 CT chest: dense LLL consolidation, no evidence of tumor or mass 5/8 early AM - transferred to SDU with increased SOB 5/9 increased dyspnea and worsening hypoxemia  LINES / TUBES:   CULTURES: MRSA PCR 5/5 >> NEG Blood 5/5 >> NEG Resp 5/6 >> mod candida Strep Ag 5/6 >> NEG Legionella 5/6 >> NEG Resp 5/10 >> Mod Candida  ANTIBIOTICS: 5/5 Cefepime>>>5/5 5/5 Vanco>> 5/9 5/5 Zoysn>> 5/12 cipro 5/9 >> 5/12 Levofloxacin 5/12 >>  Fluconazole 5/12 >>  .  SUBJECTIVE:  No new complaints. Still desaturates easily. Requires VM   VITAL SIGNS: Temp:  [97.6 F (36.4 C)-98.5 F (36.9 C)] 98 F (36.7 C) (05/12 1205) Pulse Rate:  [79-119] 103 (05/12 1205) Resp:  [20-28] 22 (05/12 1205) BP: (102-142)/(45-74) 105/57 mmHg (05/12 1205) SpO2:  [83 %-100 %] 92 % (05/12 1205) FiO2 (%):  [40 %-50 %] 40 % (05/12 1205) 6 liters  PHYSICAL EXAMINATION: General:  Pleasant, NAD Neuro:  No focal deficits HEENT:  WNL Cardiovascular: IRIR s M Lungs: diminished BS in LLL, R basilar crackles, no wheezes. Abdomen:  Soft, nontender, tympanic perc  Ext: No clubbing, minimal ankle edema   Recent Labs Lab 05/09/13 0419 05/09/13 1439 05/10/13 1230 05/11/13 0410  NA 132*  --  132* 133*  K 4.3  --  4.4 4.2  CL 90*  --  90* 92*  CO2 31  --  35* 36*  BUN 55*  --  65* 64*  CREATININE 1.90*  --  1.88* 1.86*  GLUCOSE 200* 474* 265* 216*     Recent Labs Lab 05/09/13 0419 05/10/13 1230 05/11/13 0410  HGB 8.2* 7.7* 7.2*  HCT 24.9* 23.6* 22.0*  WBC 51.0* 36.6* 32.7*  PLT 127* 117* 113*   Dg Chest Port 1 View  05/11/2013  *RADIOLOGY REPORT*  Clinical Data: Evaluate endotracheal tube.  PORTABLE CHEST - 1 VIEW  Comparison: 05/09/2013  Findings: Prior median sternotomy.  Borderline cardiomegaly. Possible small left pleural effusion, similar. No pneumothorax. Slight worsening left lower lobe and infrahilar airspace disease. New or increased right lower lobe airspace disease.  IMPRESSION: Worsened aeration, with increased left greater than right airspace disease.  Considerations include multifocal infection/aspiration or alveolar pulmonary edema.   Original Report Authenticated By: Jeronimo Greaves, M.D.    ASSESSMENT / PLAN:  PULMONARY A:  Hemoptysis in setting of possible HCAP, resolved Severe COPD - no active wheezing. Acute on chronic respiratory failure - baseline nocturnal O2 dep Dense LLL consolidation  Mediastinal LAN P: Resume aspirin LMWH for DVT prophy Cont to hold Xarelto for now Cont BDs Cont O2 to maintain SpO2 > 90% Cont to follow in SDU F/u cxr intermittently  CARDIOLOGY A: A fib, improved rate control P: Cont current Rx Could consider PRBCs if rate control becomes problematic  RENAL A:  Acute renal insuff P: Monitor BMET, ordered for 5/11 Avoid nephrotoxins and volume depletion (Lasix D/C'd)  INFECTIOUS DISEASE A: Presumed  LLL PNA, NOS Leukocytosis Candida on 2 resp cultures P: Micro and abx as above   GI A:  Constipation P: LOC     HEMATOLOGY A:  Anemia of acute illness. Thrombocytopenia P: - F/U CBC.  ENDOCRINE A:  Hyperglycemia P: Cont SSI Resume Glyburide    Billy Fischer, MD ; [facility] [phone].  After 5:30 PM or weekends, call [phone]

## 2013-05-11 NOTE — Progress Notes (Signed)
Pt. Performed flutter valve. Good effort.

## 2013-05-11 NOTE — Progress Notes (Signed)
Inpatient Diabetes Program Recommendations  AACE/ADA: New Consensus Statement on Inpatient Glycemic Control (2013)  Target Ranges:  Prepandial:   less than 140 mg/dL      Peak postprandial:   less than 180 mg/dL (1-2 hours)      Critically ill patients:  140 - 180 mg/dL   Hyperglycemia remaining in 200's to 300's  Inpatient Diabetes Program Recommendations Insulin - Basal: Please add basal lantus starting with 10-15 units daily or hs Diet: add carbohydrate modified to current heart healthy diet  Thank you, Lenor Coffin, RN, CNS, Diabetes Coordinator [phone])

## 2013-05-12 ENCOUNTER — Inpatient Hospital Stay (HOSPITAL_COMMUNITY): Payer: Medicare Other

## 2013-05-12 LAB — POCT I-STAT 3, ART BLOOD GAS (G3+)
Patient temperature: 98.6
TCO2: 40 mmol/L (ref 0–100)
pH, Arterial: 7.344 — ABNORMAL LOW (ref 7.350–7.450)

## 2013-05-12 LAB — PREPARE RBC (CROSSMATCH)

## 2013-05-12 LAB — CBC
HCT: 22.8 % — ABNORMAL LOW (ref 39.0–52.0)
Hemoglobin: 7.1 g/dL — ABNORMAL LOW (ref 13.0–17.0)
MCH: 29.3 pg (ref 26.0–34.0)
MCHC: 31.1 g/dL (ref 30.0–36.0)
MCV: 94.2 fL (ref 78.0–100.0)
Platelets: 149 K/uL — ABNORMAL LOW (ref 150–400)
RBC: 2.42 MIL/uL — ABNORMAL LOW (ref 4.22–5.81)
RDW: 18.2 % — ABNORMAL HIGH (ref 11.5–15.5)
WBC: 37.4 K/uL — ABNORMAL HIGH (ref 4.0–10.5)

## 2013-05-12 LAB — BASIC METABOLIC PANEL WITH GFR
BUN: 60 mg/dL — ABNORMAL HIGH (ref 6–23)
CO2: 37 meq/L — ABNORMAL HIGH (ref 19–32)
Calcium: 8.8 mg/dL (ref 8.4–10.5)
Chloride: 95 meq/L — ABNORMAL LOW (ref 96–112)
Creatinine, Ser: 1.79 mg/dL — ABNORMAL HIGH (ref 0.50–1.35)
GFR calc Af Amer: 41 mL/min — ABNORMAL LOW
GFR calc non Af Amer: 35 mL/min — ABNORMAL LOW
Glucose, Bld: 78 mg/dL (ref 70–99)
Potassium: 4.5 meq/L (ref 3.5–5.1)
Sodium: 136 meq/L (ref 135–145)

## 2013-05-12 LAB — GLUCOSE, CAPILLARY
Glucose-Capillary: 118 mg/dL — ABNORMAL HIGH (ref 70–99)
Glucose-Capillary: 311 mg/dL — ABNORMAL HIGH (ref 70–99)
Glucose-Capillary: 253 mg/dL — ABNORMAL HIGH (ref 70–99)

## 2013-05-12 MED ORDER — BIOTENE DRY MOUTH MT LIQD: 15.0000 mL | Freq: Two times a day (BID) | OROMUCOSAL | Status: DC

## 2013-05-12 MED ORDER — BIOTENE DRY MOUTH MT LIQD
15.0000 mL | Freq: Four times a day (QID) | OROMUCOSAL | Status: DC
  Administered 2013-05-13 (×4): 15 mL via OROMUCOSAL

## 2013-05-12 MED ORDER — LEVOFLOXACIN 750 MG PO TABS
750.0000 mg | ORAL_TABLET | ORAL | Status: DC
  Filled 2013-05-12 (×2): qty 1

## 2013-05-12 MED ORDER — FUROSEMIDE 10 MG/ML IJ SOLN
20.0000 mg | Freq: Once | INTRAMUSCULAR | Status: AC
  Administered 2013-05-12: 20 mg via INTRAVENOUS

## 2013-05-12 MED ORDER — FLUCONAZOLE 200 MG PO TABS
200.0000 mg | ORAL_TABLET | Freq: Every day | ORAL | Status: DC
  Filled 2013-05-12: qty 1

## 2013-05-12 MED ORDER — PHENYLEPHRINE HCL 10 MG/ML IJ SOLN
30.0000 ug/min | INTRAVENOUS | Status: DC
  Administered 2013-05-12 (×2): 200 ug/min via INTRAVENOUS
  Filled 2013-05-12 (×2): qty 4

## 2013-05-12 MED ORDER — LEVOFLOXACIN 750 MG PO TABS
750.0000 mg | ORAL_TABLET | ORAL | Status: DC
  Filled 2013-05-12: qty 1

## 2013-05-12 MED ORDER — CHLORHEXIDINE GLUCONATE 0.12 % MT SOLN
15.0000 mL | Freq: Two times a day (BID) | OROMUCOSAL | Status: DC
Start: 2013-05-12 — End: 2013-05-12
  Filled 2013-05-12: qty 15

## 2013-05-12 MED ORDER — PROPOFOL 10 MG/ML IV EMUL
5.0000 ug/kg/min | INTRAVENOUS | Status: DC
  Administered 2013-05-12: 5 ug/kg/min via INTRAVENOUS
  Administered 2013-05-12: 15 ug/kg/min via INTRAVENOUS
  Filled 2013-05-12: qty 100

## 2013-05-12 MED ORDER — SUCCINYLCHOLINE CHLORIDE 20 MG/ML IJ SOLN
INTRAMUSCULAR | Status: AC
  Filled 2013-05-12: qty 1

## 2013-05-12 MED ORDER — FENTANYL CITRATE 0.05 MG/ML IJ SOLN
INTRAMUSCULAR | Status: AC
  Filled 2013-05-12: qty 4

## 2013-05-12 MED ORDER — INSULIN ASPART 100 UNIT/ML ~~LOC~~ SOLN
0.0000 [IU] | SUBCUTANEOUS | Status: DC
  Administered 2013-05-12: 3 [IU] via SUBCUTANEOUS
  Administered 2013-05-12: 11 [IU] via SUBCUTANEOUS
  Administered 2013-05-12: 8 [IU] via SUBCUTANEOUS
  Administered 2013-05-13: 2 [IU] via SUBCUTANEOUS
  Administered 2013-05-13: 5 [IU] via SUBCUTANEOUS
  Administered 2013-05-13: 3 [IU] via SUBCUTANEOUS

## 2013-05-12 MED ORDER — DEXTROSE-NACL 5-0.45 % IV SOLN
INTRAVENOUS | Status: DC
  Administered 2013-05-12 (×2): via INTRAVENOUS

## 2013-05-12 MED ORDER — FUROSEMIDE 10 MG/ML IJ SOLN
INTRAMUSCULAR | Status: AC
  Filled 2013-05-12: qty 4

## 2013-05-12 MED ORDER — WHITE PETROLATUM GEL
Status: AC
  Filled 2013-05-12: qty 5

## 2013-05-12 MED ORDER — SODIUM CHLORIDE 0.9 % IV BOLUS (SEPSIS)
500.0000 mL | Freq: Once | INTRAVENOUS | Status: AC
  Administered 2013-05-12: 500 mL via INTRAVENOUS

## 2013-05-12 MED ORDER — PHENYLEPHRINE HCL 10 MG/ML IJ SOLN
30.0000 ug/min | INTRAVENOUS | Status: DC
  Administered 2013-05-12: 200 ug/min via INTRAVENOUS
  Administered 2013-05-12: 30 ug/min via INTRAVENOUS
  Administered 2013-05-12: 200 ug/min via INTRAVENOUS
  Filled 2013-05-12 (×5): qty 1

## 2013-05-12 MED ORDER — DEXTROSE 5 % IV SOLN
2.0000 ug/min | INTRAVENOUS | Status: DC
  Administered 2013-05-12: 2 ug/min via INTRAVENOUS
  Filled 2013-05-12 (×2): qty 4

## 2013-05-12 MED ORDER — PROPOFOL 10 MG/ML IV EMUL
INTRAVENOUS | Status: AC
  Filled 2013-05-12: qty 100

## 2013-05-12 MED ORDER — ROCURONIUM BROMIDE 50 MG/5ML IV SOLN
INTRAVENOUS | Status: AC
  Administered 2013-05-12: 25 mg
  Filled 2013-05-12: qty 2

## 2013-05-12 MED ORDER — DILTIAZEM HCL 100 MG IV SOLR
5.0000 mg/h | INTRAVENOUS | Status: DC
  Administered 2013-05-12 (×2): 5 mg/h via INTRAVENOUS
  Filled 2013-05-12: qty 100

## 2013-05-12 MED ORDER — CHLORHEXIDINE GLUCONATE 0.12 % MT SOLN
15.0000 mL | Freq: Two times a day (BID) | OROMUCOSAL | Status: DC
  Administered 2013-05-12: 15 mL via OROMUCOSAL
  Filled 2013-05-12: qty 15

## 2013-05-12 MED ORDER — ETOMIDATE 2 MG/ML IV SOLN
INTRAVENOUS | Status: AC
  Administered 2013-05-12: 20 mg
  Filled 2013-05-12: qty 20

## 2013-05-12 MED ORDER — LIDOCAINE HCL (CARDIAC) 20 MG/ML IV SOLN
INTRAVENOUS | Status: AC
  Filled 2013-05-12: qty 5

## 2013-05-12 MED ORDER — FAMOTIDINE IN NACL 20-0.9 MG/50ML-% IV SOLN
20.0000 mg | Freq: Two times a day (BID) | INTRAVENOUS | Status: DC
  Administered 2013-05-12 – 2013-05-14 (×6): 20 mg via INTRAVENOUS
  Filled 2013-05-12 (×11): qty 50

## 2013-05-12 NOTE — Progress Notes (Signed)
Called CCM Dr. To report the pts lungs sounds have changed and sputum is now pink and frothy. New orders obtained.

## 2013-05-12 NOTE — Progress Notes (Addendum)
PULMONARY  / CRITICAL CARE MEDICINE  Name: [name] [name] MRN: [id number] DOB: 1936/10/29    ADMISSION DATE:  05/04/2013 CONSULTATION DATE:  05/04/2013  REFERRING MD :  Mahala Menghini PRIMARY SERVICE:  Hospitalist  CHIEF COMPLAINT:  hemoptysis  BRIEF PATIENT DESCRIPTION: 77 yo WM with hx of severe COPD, CHF (LVEF 55% to 60% on 4/21), and atrial flutter with recent hospitalization from 4/21-4/30 that is on Xarelto presents with hemoptysis and increasing sob X 1 day,  PCCM consulted for hemoptysis  SIGNIFICANT EVENTS / STUDIES:  5/04 - admitted with hemoptysis  5/7 CT chest: dense LLL consolidation, no evidence of tumor or mass 5/8 early AM - transferred to SDU with increased SOB 5/9 increased dyspnea and worsening hypoxemia 5/13 Increasing O2 reqt's. Increased infiltrates. Increased WBC count. Elective intubation for FOB 5/13 One unit PRBCs for tachycardia, hyotension, Hgb 7.1 5/13 FOB: normal airway exam. Cytology >>   LINES / TUBES: ETT 5/13 >>  CVL 5/13 >>    CULTURES: MRSA PCR 5/5 >> NEG Blood 5/5 >> NEG Resp 5/6 >> mod candida Strep Ag 5/6 >> NEG Legionella 5/6 >> NEG Resp 5/10 >> Mod Candida BAL bacterial cx 5/13 >>  BAL Fungal 5/13 >>  BAL AFB s5/13 >>  BAL Pneumocystis >> BAL Legionella >>   ANTIBIOTICS: 5/5 Cefepime>>>5/5 5/5 Vanco>> 5/9 5/5 Zoysn>> 5/12 cipro 5/9 >> 5/12 Levofloxacin 5/12 >>  Fluconazole 5/12 >>  .  SUBJECTIVE:  No new complaints. Increasing O2 reqt's. Increased infiltrates. Increased WBC count   VITAL SIGNS: Temp:  [97.6 F (36.4 C)-98.9 F (37.2 C)] 98.2 F (36.8 C) (05/13 1200) Pulse Rate:  [45-144] 47 (05/13 1330) Resp:  [16-29] 17 (05/13 1330) BP: (75-137)/(35-71) 103/41 mmHg (05/13 1330) SpO2:  [88 %-100 %] 100 % (05/13 1330) FiO2 (%):  [50 %-100 %] 100 % (05/13 1300) 6 liters  PHYSICAL EXAMINATION: General:  Pleasant, NAD Neuro:  No focal deficits HEENT:  WNL Cardiovascular: IRIR s M Lungs: diminished BS in BLL, bibasilar  crackles, no wheezes. Abdomen:  Soft, nontender, tympanic perc  Ext: No clubbing, minimal ankle edema   Recent Labs Lab 05/10/13 1230 05/11/13 0410 05/12/13 0425  NA 132* 133* 136  K 4.4 4.2 4.5  CL 90* 92* 95*  CO2 35* 36* 37*  BUN 65* 64* 60*  CREATININE 1.88* 1.86* 1.79*  GLUCOSE 265* 216* 78    Recent Labs Lab 05/10/13 1230 05/11/13 0410 05/12/13 0425  HGB 7.7* 7.2* 7.1*  HCT 23.6* 22.0* 22.8*  WBC 36.6* 32.7* 37.4*  PLT 117* 113* 149*   Portable Chest Xray  05/12/2013  *RADIOLOGY REPORT*  Clinical Data: Status post intubation.  PORTABLE CHEST - 1 VIEW  Comparison: Portable chest 05/12/2013.  Findings: The patient has a new endotracheal tube in place with the tip in good position approximately 4 cm above the carina.  NG tube courses into the stomach and below the inferior margin of film. Extensive bilateral airspace disease is again seen.  No pneumothorax.  IMPRESSION:  1.  ET tube and NG tube in good position. 2.  Extensive bilateral airspace disease.   Original Report Authenticated By: Holley Dexter, M.D.    Dg Chest Port 1 View  05/12/2013  *RADIOLOGY REPORT*  Clinical Data: Respiratory failure  PORTABLE CHEST - 1 VIEW  Comparison: 05/11/2013  Findings: Bilateral airspace opacities, left greater than right, similar to mildly increased.  Aortic atherosclerosis.  Heart size mildly prominent.  The left heart margin is partially obscured by the consolidation.  Left pleural effusion not excluded.  No pneumothorax.  Status post median sternotomy.  Osteopenia.  No acute osseous finding.  IMPRESSION: Bilateral airspace opacities are similar to mildly increased in the interval.  Differential includes multifocal pneumonia, aspiration and asymmetric edema.   Original Report Authenticated By: Jearld Lesch, M.D.    Dg Chest Port 1 View  05/11/2013  *RADIOLOGY REPORT*  Clinical Data: Evaluate endotracheal tube.  PORTABLE CHEST - 1 VIEW  Comparison: 05/09/2013  Findings: Prior  median sternotomy.  Borderline cardiomegaly. Possible small left pleural effusion, similar. No pneumothorax. Slight worsening left lower lobe and infrahilar airspace disease. New or increased right lower lobe airspace disease.  IMPRESSION: Worsened aeration, with increased left greater than right airspace disease.  Considerations include multifocal infection/aspiration or alveolar pulmonary edema.   Original Report Authenticated By: Jeronimo Greaves, M.D.    ASSESSMENT / PLAN:  PULMONARY A:  Hemoptysis in setting of possible HCAP, resolved Severe COPD - no active wheezing. Acute on chronic respiratory failure - baseline nocturnal O2 dep Dense LLL consolidation  New/increased RLL infiltrate 5/13 Mediastinal LAN P: Cont aspirin Cont LMWH for DVT prophy Cont to hold Xarelto for now Cont BDs as scheduled Vent settings established Recheck BNP 5/14 AM FOB planned 5/13 F/u cxr intermittently  CARDIOLOGY A: A fib, improved rate control P: Transition to diltiazem gtt while intubated. Maintain HR < 115/min Transfuse one unit PRBCs for tachycardia, hypotension  RENAL A:  Acute renal insuff P: Monitor BMET, ordered for 5/11 Avoid nephrotoxins and volume depletion   INFECTIOUS DISEASE A: Presumed LLL PNA, NOS Leukocytosis - worse 5/13 Candida on 2 resp cultures P: FOB for BAL Recheck PCT   GI A:  Constipation P: LOC     HEMATOLOGY A:  Anemia of acute illness. Thrombocytopenia P: - F/U CBC. - one unit PRBCs 5/13   ENDOCRINE A:  Hyperglycemia P: Cont SSI D/C Glyburide while on vent   45 mins CCM time   Billy Fischer, MD ; [facility] service [place] [phone].  After 5:30 PM or weekends, call [phone]

## 2013-05-12 NOTE — Procedures (Signed)
Indication:   Non-resolving pulmonary infiltrates and leukocytosis   Sedation:  Propofol infusion  Anesthesia: 20 cc 1% lidocaine  Procedure: After adequate sedation and anesthesia, the bronchoscope was introduced via the ETT and advanced into the mid-trachea. Airway anesthesia was obtained with 1% lidocaine and a complete airway exam was performed. This revealed the following.   Findings:  Changes of mld chronic bronchitis Mild diffuse mucoid secretions No active bleeding No endobronchial masses, tumors or foreign bodies   Specimens:   BAL LLL and BAL RLL sent for:  GS, culture Fungal smears and culture AFB smear and culture Pneumocystis smear Legionella DFA Cytology   Post procedure evaluation:  The patient tolerated the procedure well with no major complications    Billy Fischer, MD;  PCCM service; [place] [phone]

## 2013-05-12 NOTE — Progress Notes (Signed)
eLink Physician-Brief Progress Note Patient Name: [name] [name] DOB: Oct 11, 1936 MRN: [id number]  Date of Service  05/12/2013   HPI/Events of Note   UOP low, mild hypotension  eICU Interventions  Gave NS bolus , waiting on PRBC   Intervention Category Major Interventions: Shock - evaluation and management  Shan Levans 05/12/2013, 5:15 PM

## 2013-05-12 NOTE — Progress Notes (Signed)
Dr Delford Field notified of persistent low BP despite maxing out neo gtt. Blood on it's way from blood bank. Order for ns bolus received and CVP checks.

## 2013-05-12 NOTE — Progress Notes (Signed)
Dr Delford Field called and updated on pt status, gtts, CVP, BP results. No new orders received. Monitoring closely.

## 2013-05-12 NOTE — Procedures (Signed)
Arterial Catheter Insertion Procedure Note [name] [name] [id number] 07/20/1936  Procedure: Insertion of Arterial Catheter  Indications: Blood pressure monitoring  Procedure Details Consent: Risks of procedure as well as the alternatives and risks of each were explained to the (patient/caregiver).  Consent for procedure obtained. Time Out: Verified patient identification, verified procedure, site/side was marked, verified correct patient position, special equipment/implants available, medications/allergies/relevent history reviewed, required imaging and test results available.  Performed  Maximum sterile technique was used including antiseptics, cap, gloves, gown, hand hygiene, mask and sheet. Skin prep: Chlorhexidine; local anesthetic administered 22 gauge catheter was inserted into right radial artery using the Seldinger technique.  Evaluation Blood flow good; BP tracing good. Complications: No apparent complications.   [name] [name] 05/12/2013

## 2013-05-12 NOTE — Procedures (Signed)
Central Venous Catheter Insertion Procedure Note [name] [name] [id number] 03-18-1936  Procedure: Insertion of Central Venous Catheter Indications: Assessment of intravascular volume, Drug and/or fluid administration and Frequent blood sampling  Procedure Details Consent: Risks of procedure as well as the alternatives and risks of each were explained to the (patient/caregiver).  Consent for procedure obtained. Time Out: Verified patient identification, verified procedure, site/side was marked, verified correct patient position, special equipment/implants available, medications/allergies/relevent history reviewed, required imaging and test results available.  Performed  Maximum sterile technique was used including antiseptics, cap, gloves, gown, hand hygiene, mask and sheet. Skin prep: Chlorhexidine; local anesthetic administered A antimicrobial bonded/coated triple lumen catheter was placed in the left internal jugular vein using the Seldinger technique to 20cm.   Evaluation Blood flow good Complications: No apparent complications Patient did tolerate procedure well. Chest X-ray ordered to verify placement.  CXR: pending.   [name], NP-C [facility] Pgr: [phone] or [phone]   05/12/2013, 3:29 PM  I was present for and supervised the entire procedure  Billy Fischer, MD ; [facility] service Mobile [phone].  After 5:30 PM or weekends, call [phone]

## 2013-05-12 NOTE — Procedures (Signed)
Intubation Procedure Note [name] [name] [id number] Jul 25, 1936  Procedure: Intubation Indications: Elective intubation for bronchoscopy  Procedure Details Consent: Risks of procedure as well as the alternatives and risks of each were explained to the (patient/caregiver).  Consent for procedure obtained. Time Out: Verified patient identification, verified procedure, site/side was marked, verified correct patient position, special equipment/implants available, medications/allergies/relevent history reviewed, required imaging and test results available.  Performed  Maximum sterile technique was used including antiseptics, cap, gloves, gown, hand hygiene and mask.  MAC and 3    Evaluation Hemodynamic Status: BP stable throughout; O2 sats: stable throughout Patient's Current Condition: stable Complications: No apparent complications Patient did tolerate procedure well. Chest X-ray ordered to verify placement.  CXR: pending.   Procedure performed under direct supervision of Dr. Sung Amabile.   [name], NP-C [facility] Pgr: [phone] or [phone]   05/12/2013   I was present for and supervised the entire procedure  Billy Fischer, MD ; [facility] service [place] [phone].  After 5:30 PM or weekends, call [phone]

## 2013-05-12 NOTE — Progress Notes (Signed)
PT Cancellation Note  Patient Details Name: [name] [name] MRN: [id number] DOB: 1936/12/05   Cancelled Treatment:    Reason Eval/Treat Not Completed: Patient not medically ready. RN deferred due to pt getting ready to be intubated and then undergo a bronch. PT to re-attempt when able/appropriate.   Lewis Shock, PT, DPT Pager #: [phone] Office #: [phone]

## 2013-05-12 NOTE — Progress Notes (Signed)
Video bronchoscopy with washing intervention  [name] Fischer, MD ; [facility] [phone].  After 5:30 PM or weekends, call [phone]

## 2013-05-12 NOTE — Procedures (Signed)
Bedside Bronchoscopy Procedure Note [name] [name] [id number] [birth year]  Procedure: Bronchoscopy Indications: Diagnostic evaluation of the airways, Obtain specimens for culture and/or other diagnostic studies and Remove secretions  Procedure Details: ET Tube Size:8.0 ET Tube secured at lip (cm):23 Bite block in place: No In preparation for procedure, Patient hyper-oxygenated with 100 % FiO2 Airway entered and the following bronchi were examined: RML.   Bronchoscope removed.    Evaluation BP 88/41  Pulse 46  Temp(Src) 98.2 F (36.8 C) (Oral)  Resp 19  Ht 5\' 6"  (1.676 m)  Wt 170 lb 6.7 oz (77.3 kg)  BMI 27.52 kg/m2  SpO2 94% Breath Sounds:Rales O2 sats: stable throughout Patient's Current Condition: stable Specimens:  Sent serosanguinous fluid Complications: No apparent complications Patient did tolerate procedure well.   [name] [name], [name] [name] 05/12/2013, 3:31 PM

## 2013-05-12 NOTE — Progress Notes (Signed)
Dr Delford Field notified of continued low BP, orders received.

## 2013-05-12 NOTE — Progress Notes (Signed)
OT Cancellation Note  Patient Details Name: [name] [name] MRN: [id number] DOB: [birth year]   Cancelled Treatment:    Reason Eval/Treat Not Completed: Patient at procedure or test/ unavailable (Having central line placed)  [facility] Georgi Navarrete, OTR/L  [phone] 05/12/2013   05/12/2013, 4:29 PM

## 2013-05-12 NOTE — Progress Notes (Signed)
Increased oxygen requirements;   Obtain stat CXR; lasix 20 mg X1.

## 2013-05-13 ENCOUNTER — Encounter: Payer: Medicare Other | Admitting: Adult Health

## 2013-05-13 ENCOUNTER — Inpatient Hospital Stay (HOSPITAL_COMMUNITY): Payer: Medicare Other

## 2013-05-13 DIAGNOSIS — I959 Hypotension, unspecified: Secondary | ICD-10-CM | POA: Diagnosis present

## 2013-05-13 DIAGNOSIS — D649 Anemia, unspecified: Secondary | ICD-10-CM

## 2013-05-13 LAB — GLUCOSE, CAPILLARY
Glucose-Capillary: 127 mg/dL — ABNORMAL HIGH (ref 70–99)
Glucose-Capillary: 164 mg/dL — ABNORMAL HIGH (ref 70–99)

## 2013-05-13 LAB — TROPONIN I: Troponin I: <0.3 ng/mL (ref <0.30)

## 2013-05-13 LAB — URINALYSIS, ROUTINE W REFLEX MICROSCOPIC
Glucose, UA: NEGATIVE mg/dL
Leukocytes, UA: NEGATIVE
Protein, ur: 30 mg/dL — AB
Specific Gravity, Urine: 1.023 (ref 1.005–1.030)

## 2013-05-13 LAB — CBC WITH DIFFERENTIAL/PLATELET
Basophils Absolute: 0 10*3/uL (ref 0.0–0.1)
Basophils Relative: 0 % (ref 0–1)
Eosinophils Absolute: 0 10*3/uL (ref 0.0–0.7)
Eosinophils Relative: 0 % (ref 0–5)
Hemoglobin: 7.8 g/dL — ABNORMAL LOW (ref 13.0–17.0)
Lymphocytes Relative: 4 % — ABNORMAL LOW (ref 12–46)
Lymphs Abs: 1.4 10*3/uL (ref 0.7–4.0)
Monocytes Relative: 19 % — ABNORMAL HIGH (ref 3–12)
Neutro Abs: 26.7 10*3/uL — ABNORMAL HIGH (ref 1.7–7.7)
Neutrophils Relative %: 77 % (ref 43–77)
Platelets: 177 10*3/uL (ref 150–400)
RBC: 2.64 MIL/uL — ABNORMAL LOW (ref 4.22–5.81)
WBC: 34.7 10*3/uL — ABNORMAL HIGH (ref 4.0–10.5)
nRBC: 0 /100 WBC

## 2013-05-13 LAB — URINE MICROSCOPIC-ADD ON

## 2013-05-13 LAB — BASIC METABOLIC PANEL
CO2: 34 mEq/L — ABNORMAL HIGH (ref 19–32)
Chloride: 93 mEq/L — ABNORMAL LOW (ref 96–112)
Creatinine, Ser: 1.6 mg/dL — ABNORMAL HIGH (ref 0.50–1.35)
Potassium: 4.3 mEq/L (ref 3.5–5.1)
Sodium: 130 mEq/L — ABNORMAL LOW (ref 135–145)

## 2013-05-13 LAB — PROCALCITONIN: Procalcitonin: 0.79 ng/mL

## 2013-05-13 LAB — PRO B NATRIURETIC PEPTIDE: Pro B Natriuretic peptide (BNP): 4038 pg/mL — ABNORMAL HIGH (ref 0–450)

## 2013-05-13 LAB — POCT I-STAT 3, ART BLOOD GAS (G3+)
Acid-Base Excess: 9 mmol/L — ABNORMAL HIGH (ref 0.0–2.0)
O2 Saturation: 86 %
TCO2: 37 mmol/L (ref 0–100)
pCO2 arterial: 59.3 mmHg (ref 35.0–45.0)
pO2, Arterial: 54 mmHg — ABNORMAL LOW (ref 80.0–100.0)

## 2013-05-13 LAB — PNEUMOCYSTIS JIROVECI SMEAR BY DFA: Pneumocystis jiroveci Ag: NEGATIVE

## 2013-05-13 MED ORDER — METHYLPREDNISOLONE SODIUM SUCC 125 MG IJ SOLR
80.0000 mg | Freq: Two times a day (BID) | INTRAMUSCULAR | Status: DC
  Administered 2013-05-13 – 2013-05-14 (×4): 80 mg via INTRAVENOUS
  Filled 2013-05-13 (×5): qty 1.28

## 2013-05-13 MED ORDER — SALINE SPRAY 0.65 % NA SOLN
1.0000 | NASAL | Status: DC | PRN
  Administered 2013-05-13 – 2013-05-24 (×2): 1 via NASAL
  Filled 2013-05-13 (×2): qty 44

## 2013-05-13 MED ORDER — NOREPINEPHRINE BITARTRATE 1 MG/ML IJ SOLN
2.0000 ug/min | INTRAVENOUS | Status: DC
  Administered 2013-05-13: 25 ug/min via INTRAVENOUS
  Filled 2013-05-13: qty 16

## 2013-05-13 MED ORDER — ACETAMINOPHEN 160 MG/5ML PO SOLN
650.0000 mg | ORAL | Status: DC | PRN
  Administered 2013-05-13: 650 mg
  Filled 2013-05-13 (×2): qty 20.3

## 2013-05-13 MED ORDER — FLUCONAZOLE IN SODIUM CHLORIDE 200-0.9 MG/100ML-% IV SOLN
200.0000 mg | INTRAVENOUS | Status: DC
  Administered 2013-05-13 – 2013-05-14 (×2): 200 mg via INTRAVENOUS
  Filled 2013-05-13 (×3): qty 100

## 2013-05-13 MED ORDER — FLUTICASONE PROPIONATE 50 MCG/ACT NA SUSP
1.0000 | Freq: Every day | NASAL | Status: DC
  Administered 2013-05-14 – 2013-05-21 (×8): 1 via NASAL
  Filled 2013-05-13: qty 16

## 2013-05-13 MED ORDER — SODIUM CHLORIDE 0.9 % IV SOLN
25.0000 ug/h | INTRAVENOUS | Status: DC
  Administered 2013-05-13: 50 ug/h via INTRAVENOUS
  Filled 2013-05-13: qty 50

## 2013-05-13 MED ORDER — INSULIN GLARGINE 100 UNIT/ML ~~LOC~~ SOLN
15.0000 [IU] | Freq: Every day | SUBCUTANEOUS | Status: DC
  Administered 2013-05-13 – 2013-05-15 (×3): 15 [IU] via SUBCUTANEOUS
  Filled 2013-05-13 (×4): qty 0.15

## 2013-05-13 MED ORDER — LEVOFLOXACIN IN D5W 500 MG/100ML IV SOLN
500.0000 mg | INTRAVENOUS | Status: DC
  Administered 2013-05-13 – 2013-05-14 (×2): 500 mg via INTRAVENOUS
  Filled 2013-05-13 (×3): qty 100

## 2013-05-13 MED ORDER — FENTANYL BOLUS VIA INFUSION
25.0000 ug | Freq: Four times a day (QID) | INTRAVENOUS | Status: DC | PRN
  Filled 2013-05-13: qty 100

## 2013-05-13 MED ORDER — LEVOFLOXACIN IN D5W 750 MG/150ML IV SOLN
750.0000 mg | INTRAVENOUS | Status: DC
  Administered 2013-05-13: 750 mg via INTRAVENOUS
  Filled 2013-05-13: qty 150

## 2013-05-13 MED ORDER — ENOXAPARIN SODIUM 30 MG/0.3ML ~~LOC~~ SOLN: 30.0000 mg | SUBCUTANEOUS | Status: DC

## 2013-05-13 MED ORDER — FENTANYL CITRATE 0.05 MG/ML IJ SOLN: 12.5000 ug | INTRAMUSCULAR | Status: DC | PRN

## 2013-05-13 MED ORDER — CHLORHEXIDINE GLUCONATE 0.12 % MT SOLN
15.0000 mL | Freq: Two times a day (BID) | OROMUCOSAL | Status: DC
  Administered 2013-05-13: 15 mL via OROMUCOSAL

## 2013-05-13 MED ORDER — LEVOFLOXACIN IN D5W 750 MG/150ML IV SOLN: 750.0000 mg | INTRAVENOUS | Status: DC

## 2013-05-13 MED ORDER — INSULIN ASPART 100 UNIT/ML ~~LOC~~ SOLN
0.0000 [IU] | Freq: Three times a day (TID) | SUBCUTANEOUS | Status: DC
  Administered 2013-05-13 – 2013-05-14 (×2): 5 [IU] via SUBCUTANEOUS
  Administered 2013-05-14: 11 [IU] via SUBCUTANEOUS
  Administered 2013-05-14: 5 [IU] via SUBCUTANEOUS
  Administered 2013-05-14 – 2013-05-15 (×2): 8 [IU] via SUBCUTANEOUS

## 2013-05-13 MED ORDER — HYDRALAZINE HCL 20 MG/ML IJ SOLN
INTRAMUSCULAR | Status: AC
  Filled 2013-05-13: qty 1

## 2013-05-13 MED ORDER — BIOTENE DRY MOUTH MT LIQD: 15.0000 mL | Freq: Two times a day (BID) | OROMUCOSAL | Status: DC

## 2013-05-13 NOTE — Progress Notes (Signed)
I was called by the bedside nurse; the patient continues to remain pressor dependent; still on diltiazem drip and propofol for sedation.   Plan: initiate Fentanyl for sedation; stop the propofol; stop diltiazem drip. Cont pressor support.

## 2013-05-13 NOTE — Progress Notes (Signed)
Wasted fentanyl in sink.witness ERin Smith,rn.

## 2013-05-13 NOTE — Progress Notes (Signed)
PULMONARY  / CRITICAL CARE MEDICINE  Name: [name] [name] MRN: [id number] DOB: June 15, 1936    ADMISSION DATE:  05/04/2013 CONSULTATION DATE:  05/04/2013  REFERRING MD :  Mahala Menghini PRIMARY SERVICE:  Hospitalist  CHIEF COMPLAINT:  hemoptysis  BRIEF PATIENT DESCRIPTION: 77 yo WM with hx of severe COPD, CHF (LVEF 55% to 60% on 4/21), and atrial flutter with recent hospitalization from 4/21-4/30 for SBo treated conservatively. Was on Xarelto and presented with hemoptysis pulmonary infiltrates initially attributed to HCAP.   SIGNIFICANT EVENTS / STUDIES:  5/4 - admitted with hemoptysis  5/5 - 5/7 persistent hemoptysis 5/7 CT chest: dense LLL consolidation, no evidence of tumor or mass 5/8 early AM - transferred to SDU with increased SOB 5/8 No further hemoptysis 5/9 increased dyspnea and worsening hypoxemia 5/13 Increasing O2 reqt's. Increased infiltrates. Increased WBC count. Elective intubation for FOB 5/13 One unit PRBCs for tachycardia, hypotension, Hgb 7.1 5/13 FOB: normal airway exam. No evidence of active bleeding. Minimal mucoid secretions. Cytology >>  5/13 Hypotension due to sedatives, mech vent - vasopressors initiated 5/14 Vasculitis serologies ordered >>  5/14 Passed SBT. Extubated to NRB, PRN BiPAP  LINES / TUBES: ETT 5/13 >> 5/14 CVL 5/13 >>  L IJ CVL 5/13 >>  R radial A line 5/13 >>   CULTURES: MRSA PCR 5/5 >> NEG Blood 5/5 >> NEG Resp 5/6 >> mod candida Strep Ag 5/6 >> NEG Legionella 5/6 >> NEG Resp 5/10 >> Mod Candida BAL bacterial cx 5/13 >>  BAL Fungal 5/13 >>  BAL AFB s5/13 >>  BAL Pneumocystis >> BAL Legionella >> Blood 5/13 >>  Urine 5/12 >>   PCT 5/5 > 0.21,   5/7 > 0.14,   5/9 > 0.41,   5/14 > 0.79,   5/15 >    ANTIBIOTICS: Cefepime 5/5 >>>5/5 Vanco 5/5 >> 5/9 Zoysn 5/5 >> 5/12 cipro 5/9 >> 5/12 Levofloxacin 5/12 >>  Fluconazole 5/12 >>    SUBJECTIVE:  RASS 0. + F/C   VITAL SIGNS: Temp:  [98.2 F (36.8 C)-101.2 F (38.4 C)] 98.3 F  (36.8 C) (05/14 0805) Pulse Rate:  [44-144] 86 (05/14 0805) Resp:  [15-29] 16 (05/14 0805) BP: (62-137)/(32-97) 129/51 mmHg (05/14 0805) SpO2:  [90 %-100 %] 95 % (05/14 0805) Arterial Line BP: (98-135)/(40-54) 110/45 mmHg (05/14 0700) FiO2 (%):  [50 %-100 %] 50 % (05/14 0845) Weight:  [80.695 kg (177 lb 14.4 oz)] 80.695 kg (177 lb 14.4 oz) (05/14 0500)   PHYSICAL EXAMINATION: General:  NAD Neuro:  No focal deficits HEENT:  WNL Cardiovascular: IRIR s M Lungs: diminished BS in BLL, bibasilar crackles, scattered end exp wheezes Abdomen:  Soft, nontender, NABS Ext: No clubbing, minimal ankle edema   Recent Labs Lab 05/11/13 0410 05/12/13 0425 05/13/13 0415  NA 133* 136 130*  K 4.2 4.5 4.3  CL 92* 95* 93*  CO2 36* 37* 34*  BUN 64* 60* 56*  CREATININE 1.86* 1.79* 1.60*  GLUCOSE 216* 78 144*    Recent Labs Lab 05/11/13 0410 05/12/13 0425 05/13/13 0415  HGB 7.2* 7.1* 7.8*  HCT 22.0* 22.8* 23.7*  WBC 32.7* 37.4* 34.7*  PLT 113* 149* 177   CXR:  NSC BLL AS dz   ASSESSMENT / PLAN:  PULMONARY A:  Acute on chronic respiratory failure Hemoptysis, resolved Severe COPD Pulmonary infiltrates of unclear etiology Mediastinal LAN - likely reactive P: Extubate to high flow O2, PRN BiPAP Cont to hold Xarelto for now Cont BDs as scheduled Vasculitis serologies ordered 5/14  Empiric steroids initiated 5/14 F/U bronch culture results and cytology   CARDIOLOGY A: A fib, improved rate control Hypotension - likely due to sedation and mechanical ventilation Elevated BNP P: Holding diltiazem Wean vasopressors to off for MAP > 60 mmHg Keep I/Os even Cont to hold anticoagulation for now Avoiding amiodarone while on high flow O2  RENAL A:  Acute on chronic renal insuff Active urinary sediment P: Monitor BMET Vasculitis serologies ordered Avoid nephrotoxins and volume depletion    INFECTIOUS DISEASE A: Presumed LLL PNA - little evidence of active bacterial  process  Suspect noninfectious cause of infiltrates Leukocytosis  Candida on 2 resp cultures P: Abx and micro as above   GI A:  Constipation Recent SBO treated conservatively P: LOC   Advance nutrition as tolerated  HEMATOLOGY A:  Anemia of acute illness. Thrombocytopenia. improved P: - F/U CBC. - transfuse for Hgb < 7.0 or for acute bleeding/symtpomatic anemia   ENDOCRINE A:  DM2 Hyperglycemia - anticipate worse control on systemic steroids P: Begin Lantus 5/14 Cont SSI  Wife updated 45 mins CCM time   Billy Fischer, MD ; [facility] [place] service Mobile [phone].  After 5:30 PM or weekends, call [phone]

## 2013-05-14 ENCOUNTER — Inpatient Hospital Stay (HOSPITAL_COMMUNITY): Payer: Medicare Other

## 2013-05-14 LAB — CBC
HCT: 20.2 % — ABNORMAL LOW (ref 39.0–52.0)
HCT: 23.8 % — ABNORMAL LOW (ref 39.0–52.0)
Hemoglobin: 7.8 g/dL — ABNORMAL LOW (ref 13.0–17.0)
MCHC: 32.2 g/dL (ref 30.0–36.0)
MCV: 93.1 fL (ref 78.0–100.0)
RDW: 19.1 % — ABNORMAL HIGH (ref 11.5–15.5)
RDW: 18 % — ABNORMAL HIGH (ref 11.5–15.5)
WBC: 9.5 10*3/uL (ref 4.0–10.5)

## 2013-05-14 LAB — BASIC METABOLIC PANEL
BUN: 48 mg/dL — ABNORMAL HIGH (ref 6–23)
CO2: 33 mEq/L — ABNORMAL HIGH (ref 19–32)
Chloride: 94 mEq/L — ABNORMAL LOW (ref 96–112)
Creatinine, Ser: 1.4 mg/dL — ABNORMAL HIGH (ref 0.50–1.35)

## 2013-05-14 LAB — CULTURE, RESPIRATORY W GRAM STAIN

## 2013-05-14 LAB — URINE CULTURE: Colony Count: NO GROWTH

## 2013-05-14 LAB — GLUCOSE, CAPILLARY
Glucose-Capillary: 219 mg/dL — ABNORMAL HIGH (ref 70–99)
Glucose-Capillary: 322 mg/dL — ABNORMAL HIGH (ref 70–99)
Glucose-Capillary: 263 mg/dL — ABNORMAL HIGH (ref 70–99)

## 2013-05-14 LAB — ANCA SCREEN W REFLEX TITER
Atypical p-ANCA Screen: NEGATIVE
c-ANCA Screen: NEGATIVE
p-ANCA Screen: NEGATIVE

## 2013-05-14 LAB — PREPARE RBC (CROSSMATCH)

## 2013-05-14 LAB — C3 COMPLEMENT: C3 Complement: 93 mg/dL (ref 90–180)

## 2013-05-14 MED ORDER — METOPROLOL TARTRATE 1 MG/ML IV SOLN
2.5000 mg | INTRAVENOUS | Status: DC | PRN
  Administered 2013-05-14 (×2): 2.5 mg via INTRAVENOUS
  Filled 2013-05-14: qty 5

## 2013-05-14 MED ORDER — DILTIAZEM HCL ER 90 MG PO CP12: 90.0000 mg | ORAL_CAPSULE | Freq: Two times a day (BID) | ORAL | Status: DC

## 2013-05-14 MED ORDER — GLYBURIDE 2.5 MG PO TABS
2.5000 mg | ORAL_TABLET | Freq: Two times a day (BID) | ORAL | Status: DC
  Administered 2013-05-14 – 2013-05-25 (×22): 2.5 mg via ORAL
  Filled 2013-05-14 (×25): qty 1

## 2013-05-14 MED ORDER — DILTIAZEM HCL 90 MG PO TABS
90.0000 mg | ORAL_TABLET | Freq: Two times a day (BID) | ORAL | Status: DC
  Administered 2013-05-14 (×2): 90 mg via ORAL
  Filled 2013-05-14 (×4): qty 1

## 2013-05-14 NOTE — Progress Notes (Signed)
Physical Therapy Treatment Patient Details Name: AXLE PARFAIT MRN: [id number] DOB: 15-Jan-1936 Today's Date: 05/14/2013 Time: 4098-1191 PT Time Calculation (min): 27 min  PT Assessment / Plan / Recommendation Comments on Treatment Session   Pt cont's to present with decreased activity tolerance.  Fairly steady with ambulation but does desat to mid 80's however is able to recover quickly back to 90's.  Requires lengthy seated rest break before attempting ambulation again.      Follow Up Recommendations  Home health PT;Supervision for mobility/OOB     Does the patient have the potential to tolerate intense rehabilitation     Barriers to Discharge        Equipment Recommendations  Rolling walker with 5" wheels    Recommendations for Other Services    Frequency Min 3X/week   Plan Discharge plan remains appropriate;Frequency remains appropriate    Precautions / Restrictions Restrictions Weight Bearing Restrictions: No   Pertinent Vitals/Pain 15L non-rebreather.  02 sats 97% upon arrival.  Desated to mid 80's with ambulation but quickly recovered back to 90's with seated rest break & pursed lip breathing.      Mobility  Bed Mobility Bed Mobility: Supine to Sit;Sitting - Scoot to Edge of Bed Supine to Sit: 5: Supervision;HOB elevated;With rails Sitting - Scoot to Edge of Bed: 5: Supervision Details for Bed Mobility Assistance: Increased time Transfers Transfers: Sit to Stand;Stand to Sit Sit to Stand: 4: Min assist;With upper extremity assist;With armrests;From bed;From chair/3-in-1 Stand to Sit: 4: Min assist;With upper extremity assist;With armrests;To chair/3-in-1 Details for Transfer Assistance: Max cues for hand placement.  Pt braces back of LE's against chair for stability   Ambulation/Gait Ambulation/Gait Assistance: 4: Min assist (+ 2 for lines & to follow with chair) Ambulation Distance (Feet): 100 Assistive device: Rolling walker Ambulation/Gait Assistance Details:  Required seated rest break x2.  Fatigues quickly.  02 sats dropped to 83-85% but quickly recovered back to >90%.  Cues for body positioning inside RW, tall posture, & pursed lip breathing.   Gait Pattern: Step-through pattern;Decreased stride length;Trunk flexed (decreased step height) Gait velocity: slow Stairs: No Wheelchair Mobility Wheelchair Mobility: No       PT Goals Acute Rehab PT Goals Time For Goal Achievement: 05/20/13 Potential to Achieve Goals: Good Pt will go Supine/Side to Sit: with modified independence;Other (comment) PT Goal: Supine/Side to Sit - Progress: Progressing toward goal Pt will go Sit to Supine/Side: with modified independence;with HOB 0 degrees;with rail Pt will Ambulate: >150 feet;with least restrictive assistive device;with supervision PT Goal: Ambulate - Progress: Progressing toward goal Pt will Go Up / Down Stairs: 3-5 stairs;with supervision;with least restrictive assistive device Pt will Perform Home Exercise Program: Independently  Visit Information  Last PT Received On: 05/14/13 Assistance Needed: +1    Subjective Data      Cognition  Cognition Arousal/Alertness: Awake/alert Behavior During Therapy: WFL for tasks assessed/performed Overall Cognitive Status: Within Functional Limits for tasks assessed    Balance  Static Sitting Balance Static Sitting - Balance Support: No upper extremity supported;Feet supported Static Sitting - Level of Assistance: 6: Modified independent (Device/Increase time) Static Sitting - Comment/# of Minutes: 5 mins  End of Session PT - End of Session Equipment Utilized During Treatment: Gait belt;Oxygen Activity Tolerance: Patient limited by fatigue Patient left: in chair;with call bell/phone within reach;with family/visitor present Nurse Communication: Mobility status     Verdell Face, [place] [phone] 05/14/2013

## 2013-05-14 NOTE — Progress Notes (Signed)
PULMONARY  / CRITICAL CARE MEDICINE  Name: [name] [name] MRN: [id number] DOB: [birth year]    ADMISSION DATE:  05/04/2013 CONSULTATION DATE:  05/04/2013  REFERRING MD :  Verlon Au PRIMARY SERVICE:  Hospitalist  CHIEF COMPLAINT:  hemoptysis  BRIEF PATIENT DESCRIPTION: 77 yo WM with hx of severe COPD, CHF (LVEF 55% to 60% on 4/21), and atrial flutter with recent hospitalization from 4/21-4/30 for SBo treated conservatively. Was on Xarelto and presented with hemoptysis pulmonary infiltrates initially attributed to HCAP.   SIGNIFICANT EVENTS / STUDIES:  5/4 - admitted with hemoptysis  5/5 - 5/7 persistent hemoptysis 5/7 CT chest: dense LLL consolidation, no evidence of tumor or mass 5/8 early AM - transferred to SDU with increased SOB 5/8 No further hemoptysis 5/9 increased dyspnea and worsening hypoxemia 5/13 Increasing O2 reqt's. Increased infiltrates. Increased WBC count. Elective intubation for FOB 5/13 One unit PRBCs for tachycardia, hypotension, Hgb 7.1 5/13 FOB: normal airway exam. No evidence of active bleeding. Minimal mucoid secretions. Cytology >> NO malignant cells identified 5/13 Hypotension due to sedatives, mech vent - vasopressors initiated 5/14 Vasculitis serologies ordered: ESR 44, ANA neg, GBM Ab neg, ANCA neg 5/14 Passed SBT. Extubated to NRB, PRN BiPAP 5/15 One unit PRBCs for Hgb 6.5   LINES / TUBES: ETT 5/13 >> 5/14 L IJ CVL 5/13 >>  R radial A line 5/13 >> 5/15  CULTURES: MRSA PCR 5/5 >> NEG Blood 5/5 >> NEG Resp 5/6 >> mod candida Strep Ag 5/6 >> NEG Legionella 5/6 >> NEG Resp 5/10 >> Mod Candida BAL bacterial cx 5/13 >> NEG BAL Pneumocystis >> NEG Urine 5/14 >> NEG BAL Fungal 5/13 >> smear negative >>  BAL Legionella >> smear negative >>  BAL AFB 5/13 >>  Blood 5/13 >>    PCT 5/5 > 0.21,   5/7 > 0.14,   5/9 > 0.41,   5/14 > 0.79,   5/15 >  0.37   ANTIBIOTICS: Cefepime 5/5 >>>5/5 Vanco 5/5 >> 5/9 Zoysn 5/5 >> 5/12 cipro 5/9 >>  5/12 Levofloxacin 5/12 >>  Fluconazole 5/12 >>    SUBJECTIVE:  Looks much better. Able to tolerate [place] with marginal SpO2 and no overt distress  VITAL SIGNS: Temp:  [97.4 F (36.3 C)-99 F (37.2 C)] 98.3 F (36.8 C) (05/15 1200) Pulse Rate:  [25-130] 124 (05/15 1200) Resp:  [12-23] 23 (05/15 1200) BP: (98-168)/(41-129) 108/68 mmHg (05/15 1200) SpO2:  [83 %-100 %] 95 % (05/15 1337) Arterial Line BP: (110-153)/(43-61) 140/51 mmHg (05/15 0800) FiO2 (%):  [50 %-100 %] 100 % (05/15 0717)   PHYSICAL EXAMINATION: General:  NAD Neuro:  No focal deficits HEENT:  WNL Cardiovascular: IRIR s M Lungs: diminished BS in BLL, bibasilar crackles, scattered end exp wheezes Abdomen:  Soft, nontender, NABS Ext: No clubbing, minimal ankle edema   Recent Labs Lab 05/12/13 0425 05/13/13 0415 05/14/13 0355  NA 136 130* 131*  K 4.5 4.3 5.4*  CL 95* 93* 94*  CO2 37* 34* 33*  BUN 60* 56* 48*  CREATININE 1.79* 1.60* 1.40*  GLUCOSE 78 144* 225*    Recent Labs Lab 05/13/13 0415 05/14/13 0355 05/14/13 0800  HGB 7.8* 6.5* 7.8*  HCT 23.7* 20.2* 23.8*  WBC 34.7* 9.5 9.5  PLT 177 122* 114*   CXR:  [place] BLL AS dz   ASSESSMENT / PLAN:  PULMONARY A:  Acute on chronic respiratory failure Hemoptysis, resolved Severe COPD Pulmonary infiltrates of unclear etiology Mediastinal LAN - likely reactive P: Wean O2 as tolerated  for SpO2 > 90% Cont BDs as scheduled Cont empiric steroids initiated 5/14   CARDIOLOGY A: A fib, improved rate control Hypotension, resolved Elevated BNP P: Resume diltiazem at reduced dose PRN metoprolol to maintain HR < 125/min Keep I/Os even Cont to hold anticoagulation for now Avoiding amiodarone while on high flow O2  RENAL A:  Acute on chronic renal insuff Active urinary sediment P: Monitor BMET Vasculitis serologies negative Avoid nephrotoxins and volume depletion    INFECTIOUS DISEASE A: Presumed LLL PNA - little evidence of active  bacterial process  Suspect noninfectious cause of infiltrates Leukocytosis, resolved Candida on 2 resp cultures P: Abx and micro as above   GI A:  Constipation Recent SBO treated conservatively P: LOC   Advance nutrition as tolerated  HEMATOLOGY A:  Anemia of acute illness. Thrombocytopenia. improved P: - F/U CBC. - transfuse for Hgb < 7.0 or for acute bleeding/symtpomatic anemia   ENDOCRINE A:  DM2 Hyperglycemia - anticipate worse control on systemic steroids P: Cont Lantus Cont SSI  Daughter updated 35 mins CCM time  Transfer to SDU   Merton Border, MD ; [facility] service Mobile [phone].  After 5:30 PM or weekends, call [phone]

## 2013-05-14 NOTE — Progress Notes (Signed)
Assumed care for this 7a-7p shift. Pt resting comfortably in bed. Denies pain or discomfort. Reviewed importance of using call light for needs or assistance. Vitals and assessment as documented. Dr Sung Amabile rounding and orders received for transfer to 2900 SDU. Family aware and questions answered. Monitoring closely.

## 2013-05-14 NOTE — Progress Notes (Signed)
eLink Physician-Brief Progress Note Patient Name: CHARLENE COWDREY DOB: 1936/12/19 MRN: [id number]  Date of Service  05/14/2013   HPI/Events of Note  Hgb 6.5 down from 7.8   eICU Interventions  Plan: Transfuse 1 unit of pRBC Post-transfusion CBC   Intervention Category Intermediate Interventions: Bleeding - evaluation and treatment with blood products  Riyad Keena 05/14/2013, 4:16 AM

## 2013-05-15 DIAGNOSIS — I5033 Acute on chronic diastolic (congestive) heart failure: Secondary | ICD-10-CM

## 2013-05-15 LAB — TYPE AND SCREEN: Unit division: 0

## 2013-05-15 LAB — BASIC METABOLIC PANEL
Calcium: 8.3 mg/dL — ABNORMAL LOW (ref 8.4–10.5)
Creatinine, Ser: 1.48 mg/dL — ABNORMAL HIGH (ref 0.50–1.35)
GFR calc Af Amer: 52 mL/min — ABNORMAL LOW (ref >90)
GFR calc non Af Amer: 45 mL/min — ABNORMAL LOW (ref >90)

## 2013-05-15 LAB — GLUCOSE, CAPILLARY
Glucose-Capillary: 225 mg/dL — ABNORMAL HIGH (ref 70–99)
Glucose-Capillary: 177 mg/dL — ABNORMAL HIGH (ref 70–99)
Glucose-Capillary: 222 mg/dL — ABNORMAL HIGH (ref 70–99)

## 2013-05-15 LAB — CBC
Platelets: 149 10*3/uL — ABNORMAL LOW (ref 150–400)
RDW: 17.9 % — ABNORMAL HIGH (ref 11.5–15.5)
WBC: 11.5 10*3/uL — ABNORMAL HIGH (ref 4.0–10.5)

## 2013-05-15 LAB — CULTURE, RESPIRATORY W GRAM STAIN

## 2013-05-15 MED ORDER — FLUCONAZOLE 200 MG PO TABS
200.0000 mg | ORAL_TABLET | Freq: Every day | ORAL | Status: AC
  Administered 2013-05-15 – 2013-05-19 (×5): 200 mg via ORAL
  Filled 2013-05-15 (×5): qty 1

## 2013-05-15 MED ORDER — BIOTENE DRY MOUTH MT LIQD
15.0000 mL | Freq: Two times a day (BID) | OROMUCOSAL | Status: DC
  Administered 2013-05-15 – 2013-05-24 (×18): 15 mL via OROMUCOSAL

## 2013-05-15 MED ORDER — DILTIAZEM HCL 60 MG PO TABS
120.0000 mg | ORAL_TABLET | Freq: Once | ORAL | Status: AC
Start: 2013-05-15 — End: 2013-05-15
  Administered 2013-05-15: 120 mg via ORAL
  Filled 2013-05-15: qty 2

## 2013-05-15 MED ORDER — METHYLPREDNISOLONE SODIUM SUCC 40 MG IJ SOLR
40.0000 mg | Freq: Two times a day (BID) | INTRAMUSCULAR | Status: DC
  Administered 2013-05-15 – 2013-05-18 (×7): 40 mg via INTRAVENOUS
  Filled 2013-05-15 (×8): qty 1

## 2013-05-15 MED ORDER — INSULIN ASPART 100 UNIT/ML ~~LOC~~ SOLN
3.0000 [IU] | Freq: Three times a day (TID) | SUBCUTANEOUS | Status: DC
  Administered 2013-05-15 (×2): 3 [IU] via SUBCUTANEOUS
  Administered 2013-05-16: 17:00:00 via SUBCUTANEOUS
  Administered 2013-05-16: 3 [IU] via SUBCUTANEOUS
  Administered 2013-05-16: 08:00:00 via SUBCUTANEOUS
  Administered 2013-05-17 – 2013-05-25 (×19): 3 [IU] via SUBCUTANEOUS

## 2013-05-15 MED ORDER — DILTIAZEM HCL 60 MG PO TABS
120.0000 mg | ORAL_TABLET | Freq: Two times a day (BID) | ORAL | Status: DC
  Administered 2013-05-15: 120 mg via ORAL
  Filled 2013-05-15 (×2): qty 2

## 2013-05-15 MED ORDER — INSULIN ASPART 100 UNIT/ML ~~LOC~~ SOLN
0.0000 [IU] | Freq: Three times a day (TID) | SUBCUTANEOUS | Status: DC
  Administered 2013-05-15: 3 [IU] via SUBCUTANEOUS
  Administered 2013-05-15 – 2013-05-16 (×4): 8 [IU] via SUBCUTANEOUS
  Administered 2013-05-17: 5 [IU] via SUBCUTANEOUS
  Administered 2013-05-17: 3 [IU] via SUBCUTANEOUS
  Administered 2013-05-17: 5 [IU] via SUBCUTANEOUS
  Administered 2013-05-18: 11 [IU] via SUBCUTANEOUS
  Administered 2013-05-18 (×2): 5 [IU] via SUBCUTANEOUS
  Administered 2013-05-19 (×2): 11 [IU] via SUBCUTANEOUS
  Administered 2013-05-19: 07:00:00 via SUBCUTANEOUS
  Administered 2013-05-20: 8 [IU] via SUBCUTANEOUS
  Administered 2013-05-20: 4 [IU] via SUBCUTANEOUS
  Administered 2013-05-21: 8 [IU] via SUBCUTANEOUS
  Administered 2013-05-21: 5 [IU] via SUBCUTANEOUS
  Administered 2013-05-22 (×2): 8 [IU] via SUBCUTANEOUS
  Administered 2013-05-23 – 2013-05-24 (×2): 15 [IU] via SUBCUTANEOUS
  Administered 2013-05-24 – 2013-05-25 (×2): 5 [IU] via SUBCUTANEOUS
  Administered 2013-05-25: 15 [IU] via SUBCUTANEOUS
  Administered 2013-05-26: 11 [IU] via SUBCUTANEOUS

## 2013-05-15 MED ORDER — LEVOFLOXACIN 500 MG PO TABS
500.0000 mg | ORAL_TABLET | Freq: Every day | ORAL | Status: AC
  Administered 2013-05-15 – 2013-05-17 (×3): 500 mg via ORAL
  Filled 2013-05-15 (×3): qty 1

## 2013-05-15 MED ORDER — INSULIN ASPART 100 UNIT/ML ~~LOC~~ SOLN
0.0000 [IU] | Freq: Every day | SUBCUTANEOUS | Status: DC
  Administered 2013-05-15: 2 [IU] via SUBCUTANEOUS
  Administered 2013-05-16: 3 [IU] via SUBCUTANEOUS
  Administered 2013-05-17: 22:00:00 via SUBCUTANEOUS
  Administered 2013-05-18: 3 [IU] via SUBCUTANEOUS
  Administered 2013-05-19: 2 [IU] via SUBCUTANEOUS
  Administered 2013-05-20: 3 [IU] via SUBCUTANEOUS

## 2013-05-15 MED ORDER — DILTIAZEM HCL 60 MG PO TABS
240.0000 mg | ORAL_TABLET | Freq: Two times a day (BID) | ORAL | Status: DC
  Administered 2013-05-15: 240 mg via ORAL
  Filled 2013-05-15 (×3): qty 1

## 2013-05-15 MED ORDER — FUROSEMIDE 10 MG/ML IJ SOLN
40.0000 mg | Freq: Two times a day (BID) | INTRAMUSCULAR | Status: AC
  Administered 2013-05-15 (×2): 40 mg via INTRAVENOUS
  Filled 2013-05-15 (×2): qty 4

## 2013-05-15 NOTE — Consult Note (Signed)
CARDIOLOGY CONSULT NOTE  Patient ID: [name] [name], MRN: [id number], DOB/AGE: [birth year] 77 y.o. Admit date: 05/04/2013 Date of Consult: 05/15/2013  Primary Physician: Fredirick Maudlin, MD Primary Cardiologist: Peter Swaziland, MD Referring Physician: Sung Amabile (PCCM)  Chief Complaint: atrial fib Reason for Consultation: atrial fib   HPI: [name] [name] is a  77 y.o. male w/ PMHx significant for paroxysmal atrial flutter/atrial fibrillation recently started on the anticoagulant Xarelto; he is s/p Aortic valve replacement and left sided MAZE following cardiac catheterization demonstrating normal coronaries (Aug 2011); congestive heart failure (LVEF 55-60% 04/20/13), severe COPD, and history of recent small bowel obstruction treated conservatively in April 2014.  He was discharged April 29, 2013 with Diltiazem ER 240 mg qd and Xarelto 20 mg qd and they states that he had only been taking the Xarelto for ~4 days before the "coughing up blood started." [name] [name] was admitted to [facility] with hemoptysis 05/04/13 and subsequently transferred to [facility]  where he has undergone intubation and extubation for bronchoscopy which has been unrevealing for etiology of his hemoptysis. Cardiology has been consulted for management of atrial fibrillation currently uncontrolled with Diltiazem 90mg  bid and prn metoprolol IV with heart rate 120s. Of note, he is having continuing hemoptysis on exam today (ketchup consistency).   Past Medical History  Diagnosis Date  . COPD (chronic obstructive pulmonary disease)   . AF (atrial fibrillation)   . Hypertension   . Gout   . Arthritis   . Small bowel obstruction   . Renal insufficiency   . Aortic stenosis   . Hydropneumothorax   . Diabetes mellitus   . CHF (congestive heart failure)   . On home O2     qhs prn  . Pneumonia 05/04/2013    history of pneumonia  . Pneumonia 10/12  . Hydropneumothorax 2012      Surgical History:  Past Surgical History    Procedure Laterality Date  . Rotator cuff repair      left and right  . Cardiac valve replacement      aortic valve with a tissue prosthesis and left sided maze proc  . S/p rewiring of sternum for dehiscence    . Gallbladder surgery    . US echocardiography  01-03-11    EF 55-60%  . Cardiovascular stress test  01-26-09    EF 67%  . Coronary angioplasty    . Cholecystectomy    . Sternal incision reclosure  08/29/2010    sternal rewiring  . Cataract extraction w/phaco  10/02/2012    Procedure: CATARACT EXTRACTION PHACO AND INTRAOCULAR LENS PLACEMENT (IOC);  Surgeon: Gemma Payor, MD;  Location: [facility];  Service: Ophthalmology;  Laterality: Right;  CDE 16.68  . Cataract extraction w/phaco  10/27/2012    Procedure: CATARACT EXTRACTION PHACO AND INTRAOCULAR LENS PLACEMENT (IOC);  Surgeon: Gemma Payor, MD;  Location: [facility];  Service: Ophthalmology;  Laterality: Left;  CDE:16.89  . Artificial aortic valve       Home Meds: Prior to Admission medications   Medication Sig Start Date End Date Taking? Authorizing Provider  acetaminophen (TYLENOL) 500 MG tablet Take 1,000 mg by mouth every 6 (six) hours as needed. Pain   Yes Historical Provider, MD  allopurinol (ZYLOPRIM) 300 MG tablet Take 300 mg by mouth daily.    Yes Historical Provider, MD  Ascorbic Acid (VITAMIN C) 1000 MG tablet Take 1,000 mg by mouth daily.   Yes Historical Provider, MD  aspirin 81 MG tablet Take  81 mg by mouth daily.     Yes Historical Provider, MD  diltiazem (DILACOR XR) 240 MG 24 hr capsule Take 240 mg by mouth daily.    Yes Historical Provider, MD  fish oil-omega-3 fatty acids 1000 MG capsule Take 1 g by mouth daily.   Yes Historical Provider, MD  fluticasone (FLOVENT HFA) 110 MCG/ACT inhaler Inhale 1 puff into the lungs 2 (two) times daily.   Yes Historical Provider, MD  furosemide (LASIX) 40 MG tablet Take 40 mg by mouth daily.     Yes Historical Provider, MD  glyBURIDE (DIABETA) 2.5 MG tablet Take 2.5 mg by mouth daily.      Yes Historical Provider, MD  ipratropium-albuterol (DUONEB) 0.5-2.5 (3) MG/3ML SOLN Take 3 mLs by nebulization 4 (four) times daily.   Yes Historical Provider, MD  lisinopril (PRINIVIL,ZESTRIL) 10 MG tablet Take 10 mg by mouth daily.    Yes Historical Provider, MD  methylPREDNISolone (MEDROL, PAK,) 4 MG tablet follow package directions 04/29/13  Yes Fredirick Maudlin, MD  neomycin-polymyxin-hydrocortisone (CORTISPORIN) otic solution Place 3 drops into both ears daily as needed. Earaches   Yes Historical Provider, MD  potassium chloride SA (K-DUR,KLOR-CON) 20 MEQ tablet Take 20 mEq by mouth daily.     Yes Historical Provider, MD  rivaroxaban (XARELTO) 20 MG TABS Take 1 tablet (20 mg total) by mouth daily with supper. 04/29/13  Yes Fredirick Maudlin, MD    Inpatient Medications:  . antiseptic oral rinse  15 mL Mouth Rinse BID  . aspirin  81 mg Oral Daily  . diltiazem  120 mg Oral BID  . enoxaparin (LOVENOX) injection  30 mg Subcutaneous Q24H  . fluconazole  200 mg Oral Daily  . fluticasone  1 spray Each Nare Daily  . glyBURIDE  2.5 mg Oral BID WC  . insulin aspart  0-15 Units Subcutaneous TID WC  . insulin aspart  0-5 Units Subcutaneous QHS  . insulin aspart  3 Units Subcutaneous TID WC  . insulin glargine  15 Units Subcutaneous Daily  . ipratropium  0.5 mg Nebulization Q6H  . levalbuterol  0.63 mg Nebulization Q6H  . levofloxacin  500 mg Oral Daily  . methylPREDNISolone (SOLU-MEDROL) injection  40 mg Intravenous Q12H  . sodium chloride  3 mL Intravenous Q12H   . sodium chloride Stopped (05/14/13 1600)    Allergies: No Known Allergies  History   Social History  . Marital Status: Married    Spouse Name: N/A    Number of Children: N/A  . Years of Education: N/A   Occupational History  . Not on file.   Social History Main Topics  . Smoking status: Former Smoker    Types: Cigarettes    Quit date: 01/18/2005  . Smokeless tobacco: Never Used  . Alcohol Use: No  . Drug Use: No   . Sexually Active: Yes    Birth Control/ Protection: None   Other Topics Concern  . Not on file   Social History Narrative   Liveswith wife in [place]   As a younger man at [employer]     Family History  Problem Relation Age of Onset  . Hypertension Mother   . Stroke Mother   . Heart disease Sister   . Kidney disease Sister      Review of Systems: General: negative for chills, fever, night sweats or weight changes.  ENT: negative for rhinorrhea or epistaxis Cardiovascular: ++ palpitations, negative for chest pain, shortness of breath, dyspnea on exertion,  edema, orthopnea, palpitations, or paroxysmal nocturnal dyspnea Dermatological: ++ bruising, negative for rash Respiratory: ++ hemoptysis, ++cough, no wheezing GI: negative for nausea, vomiting, diarrhea, bright red blood per rectum, melena GU: no hematuria, urgency, or frequency Neurologic: negative for visual changes, syncope, headache, or dizziness Heme: ++ easy bruising  All other systems reviewed and are otherwise negative except as noted above.  Physical Exam: Blood pressure 129/51, pulse 122, temperature 98 F (36.7 C), temperature source Oral, resp. rate 20, height 5\' 6"  (1.676 m), weight 177 lb 14.4 oz (80.695 kg), SpO2 90.00%. General: Well developed, well nourished, White male, sitting in bedside recliner, alert and oriented, in no acute distress, family at bedside HEENT: Normocephalic, atraumatic, sclera non-icteric  Neck: Supple. Carotids 2+ without bruits. JVP normal Lungs: unlabored, diminished breath sounds with crackles at at bases bilaterally, ocassional rhonchi clears with cough, no wheezes appreciated this morning Heart: RRR with normal S1 and S2. No murmurs, rubs, or gallops appreciated. Abdomen: Soft, non-tender, obese with normoactive bowel sounds Msk:  Strength and tone appear normal for age. Extremities: No clubbing, cyanosis, or edema.  Distal pedal pulses are 2+ and equal bilaterally. Skin:  large ecchymoses throughout upper extremities, few on LE Neuro: CNII-XII intact, moves all extremities spontaneously. Psych:  Responds to questions appropriately with a normal affect, although wife supplies much detail    Labs:  Recent Labs  05/13/13 1040 05/14/13 0355  TROPONINI <0.30 <0.30   Lab Results  Component Value Date   WBC 11.5* 05/15/2013   HGB 8.6* 05/15/2013   HCT 26.2* 05/15/2013   MCV 91.6 05/15/2013   PLT 149* 05/15/2013    Recent Labs Lab 05/09/13 0419  05/15/13 0400  NA 132*  < > 132*  K 4.3  < > 5.4*  CL 90*  < > 95*  CO2 31  < > 32  BUN 55*  < > 57*  CREATININE 1.90*  < > 1.48*  CALCIUM 8.4  < > 8.3*  PROT 7.0  --   --   BILITOT 1.8*  --   --   ALKPHOS 72  --   --   ALT 16  --   --   AST 14  --   --   GLUCOSE 200*  < > 217*  < > = values in this interval not displayed. Lab Results  Component Value Date   CHOL 144 10/10/2011   HDL 34.90* 10/10/2011   LDLCALC 97 10/10/2011   TRIG 63.0 10/10/2011   No results found for this basename: DDIMER    Radiology/Studies:  Ct Abdomen Pelvis Wo Contrast  04/23/2013   *RADIOLOGY REPORT*  Clinical Data: Small bowel obstruction  CT ABDOMEN AND PELVIS WITHOUT CONTRAST  Technique:  Multidetector CT imaging of the abdomen and pelvis was performed following the standard protocol without intravenous contrast. Oral contrast was given.  Comparison: September 22, 2010.  Findings: Visualized lung bases appear normal.  Status post cholecystectomy.  The liver, spleen and pancreas appear normal. Bilateral renal cysts are noted which are unchanged compared to prior exam.  Adrenal glands are normal.  No hydronephrosis or renal obstruction is noted.  No renal or ureteral calculi are noted. Appendix appears normal.  Sigmoid diverticulosis is noted without inflammation.  Urinary bladder appears normal.  Mild prostatic hypertrophy is noted.  Moderate proximal small bowel dilatation is noted with transition zone seen on image number 45  of series 2. The distal small bowel appears normal in caliber.  No abnormal fluid collection is  noted. Small fat containing periumbilical hernia is noted which is stable compared to prior exam.  IMPRESSION: Sigmoid diverticulosis is noted with inflammation.  Old bilateral renal cysts are noted.  Proximal small bowel dilatation is noted with transition zone seen in the central portion of the abdomen. This is concerning for a partial small bowel obstruction potentially due to adhesion.   Original Report Authenticated By: Lupita Raider.,  M.D.   Dg Chest 2 View  04/19/2013   *RADIOLOGY REPORT*  Clinical Data: Shortness of breath, dizziness  CHEST - 2 VIEW  Comparison: 02/03/2012  Findings: Chronic interstitial markings.  No focal consolidation. No pleural effusion or pneumothorax.  The heart is top normal in size.  Prosthetic aortic valve.  Degenerative changes of the visualized thoracolumbar spine.  IMPRESSION: No evidence of acute cardiopulmonary disease.   Original Report Authenticated By: Charline Bills, M.D.   Dg Abd 1 View  04/22/2013   *RADIOLOGY REPORT*  Clinical Data: 77 year old male with abdominal pain and swelling.  ABDOMEN - 1 VIEW  Comparison: 12/12/2011 and earlier.  Findings: Portable supine views of the abdomen and pelvis.  There are gas filled small bowel loops measuring up to 53 mm diameter. Some colonic gas is present.  Grossly stable lung bases.  No definite pneumoperitoneum. No acute osseous abnormality identified.  IMPRESSION: Abnormal gas pattern compatible with early high-grade versus partial small bowel obstruction.   Original Report Authenticated By: Erskine Speed, M.D.   Ct Chest W Contrast  05/06/2013   *RADIOLOGY REPORT*  Clinical Data: Hemoptysis.  Evaluate infiltrates.  CT CHEST WITH CONTRAST  Technique:  Multidetector CT imaging of the chest was performed following the standard protocol during bolus administration of intravenous contrast.  Contrast: 80mL OMNIPAQUE IOHEXOL 300  MG/ML  SOLN  Comparison: Abdomen and pelvis CT 04/23/2013  Findings: Lungs/pleura: There is a small left pleural effusion. Airspace consolidation involving much of the left lower lobe is identified.  This is a new finding when compared with 04/23/2013. Several patchy opacities are noted within the left upper lobe. Moderate background changes of emphysema identified.  Within the right upper lobe there is a 4 mm nodule, image 17/series 3.  This is unchanged from previous exam.  There is a right upper lobe nodule identified measuring 5 mm, image 25/series 3.  Heart/Mediastinum: The heart size is moderately enlarged.  The patient is status post median sternotomy and CABG procedure.  The right paratracheal lymph node is enlarged measuring 1.2 cm, image 12/series 2.  This is compared with 0.8 cm previously.  The subcarinal lymph node is enlarged measuring 1.5 cm, image 28/series 2.  This is compared with 0.8 cm.  Lymph node posterior to the left mainstem bronchus is new measuring 0.8 cm.  Within the posterior mediastinum there is a lymph node adjacent to the descending thoracic or aorta measuring 9 mm.  Previously this measured 8 mm.  Upper abdomen: The adrenal glands appear normal.  No focal liver abnormalities identified.  There are multiple cysts arising from both kidneys.  The thyroid gland appears unremarkable.  Bones/Musculoskeletal:  Review of the visualized osseous structures shows multilevel spondylosis within the thoracic spine.  No aggressive lytic or sclerotic bone lesions.  IMPRESSION:  1.  Dense airspace consolidation is noted within the left lower lobe.  As this is a new finding from 04/23/2013 this most likely reflects pneumonia and/or aspiration.  Follow-up imaging is advised to ensure resolution.  If this does not resolve then consider further evaluation with bronchoscopy  and tissue sampling. 2.  Multiple enlarged mediastinal lymph nodes are new from 09/06/2010.  In the setting of pneumonia this is a  nonspecific and may be reactive in etiology. Attention on follow-up imaging advised. 3. 5 mm right upper lobe nodule is new from previous exam. If the patient is at high risk for bronchogenic carcinoma, follow-up chest CT at 6-12 months is recommended.  If the patient is at low risk for bronchogenic carcinoma, follow-up chest CT at 12 months is recommended.  This recommendation follows the consensus statement: Guidelines for Management of Small Pulmonary Nodules Detected on CT Scans: A Statement from the Fleischner Society as published in Radiology 2005; 237:395-400. 4.  Left pleural effusion.   Original Report Authenticated By: Signa Kell, M.D.   Dg Chest Port 1 View  05/14/2013   *RADIOLOGY REPORT*  Clinical Data: Respiratory failure, follow-up  PORTABLE CHEST - 1 VIEW  Comparison: Portable chest x-ray of 05/13/2013  Findings: The endotracheal tube appears to have been removed. There is little change in aeration.  Patchy airspace disease remains left greater than right with probable left effusion. Cardiomegaly is stable. The left central venous line remains.  IMPRESSION: Endotracheal tube removed.  No change in aeration.  Persistent patchy opacities bilaterally.   Original Report Authenticated By: Dwyane Dee, M.D.   Dg Chest Port 1 View  05/13/2013   *RADIOLOGY REPORT*  Clinical Data: Respiratory failure  PORTABLE CHEST - 1 VIEW  Comparison: 05/12/2013  Findings: 0510 hours.  Endotracheal tube tip is approximately 3.2 cm above the base of the carina. The NG tube passes into the stomach although the distal tip position is not included on the film.  Left IJ central line tip overlies the proximal SVC. Cardiopericardial silhouette is at upper limits of normal for size. The by basilar airspace disease persists without substantial change. Telemetry leads overlie the chest.  IMPRESSION: No appreciable interval change in the bibasilar airspace disease.   Original Report Authenticated By: Kennith Center, M.D.   Dg  Chest Port 1 View  05/12/2013   *RADIOLOGY REPORT*  Clinical Data: Central line placement  PORTABLE CHEST - 1 VIEW  Comparison: 1320 hours  Findings: New left internal jugular vein central venous catheter has been placed with its tip in the mid SVC.  No pneumothorax. Stable bilateral airspace disease.  Stable endotracheal and NG tubes.  Stable cardiac silhouette.  IMPRESSION: Left internal jugular vein central venous catheter placement with its tip in the mid SVC and no pneumothorax.  Stable bilateral airspace disease.   Original Report Authenticated By: Jolaine Click, M.D.   Portable Chest Xray  05/12/2013   *RADIOLOGY REPORT*  Clinical Data: Status post intubation.  PORTABLE CHEST - 1 VIEW  Comparison: Portable chest 05/12/2013.  Findings: The patient has a new endotracheal tube in place with the tip in good position approximately 4 cm above the carina.  NG tube courses into the stomach and below the inferior margin of film. Extensive bilateral airspace disease is again seen.  No pneumothorax.  IMPRESSION:  1.  ET tube and NG tube in good position. 2.  Extensive bilateral airspace disease.   Original Report Authenticated By: Holley Dexter, M.D.   Dg Chest Port 1 View  05/12/2013   *RADIOLOGY REPORT*  Clinical Data: Respiratory failure  PORTABLE CHEST - 1 VIEW  Comparison: 05/11/2013  Findings: Bilateral airspace opacities, left greater than right, similar to mildly increased.  Aortic atherosclerosis.  Heart size mildly prominent.  The left heart margin is partially obscured  by the consolidation.  Left pleural effusion not excluded.  No pneumothorax.  Status post median sternotomy.  Osteopenia.  No acute osseous finding.  IMPRESSION: Bilateral airspace opacities are similar to mildly increased in the interval.  Differential includes multifocal pneumonia, aspiration and asymmetric edema.   Original Report Authenticated By: Jearld Lesch, M.D.   Dg Chest Port 1 View  05/11/2013   *RADIOLOGY REPORT*   Clinical Data: Evaluate endotracheal tube.  PORTABLE CHEST - 1 VIEW  Comparison: 05/09/2013  Findings: Prior median sternotomy.  Borderline cardiomegaly. Possible small left pleural effusion, similar. No pneumothorax. Slight worsening left lower lobe and infrahilar airspace disease. New or increased right lower lobe airspace disease.  IMPRESSION: Worsened aeration, with increased left greater than right airspace disease.  Considerations include multifocal infection/aspiration or alveolar pulmonary edema.   Original Report Authenticated By: Jeronimo Greaves, M.D.   Dg Chest Port 1 View  05/09/2013   *RADIOLOGY REPORT*  Clinical Data: Respiratory failure.  PORTABLE CHEST - 1 VIEW  Comparison: 05/08/2013  Findings: Stable left lower lung infiltrate.  No pulmonary edema is identified.  Right lung expansion improved.  Stable cardiomegaly.  IMPRESSION: Stable left lower lung infiltrate.  Improved aeration of the right lung.   Original Report Authenticated By: Irish Lack, M.D.   Dg Chest Port 1 View  05/08/2013   *RADIOLOGY REPORT*  Clinical Data: Follow up respiratory failure  PORTABLE CHEST - 1 VIEW  Comparison: Prior chest x-ray 05/07/2013  Findings: Stable to slightly improved left mid and lower lung interstitial and airspace disease.  Mild cardiomegaly.  Status post median sternotomy.  No pneumothorax or enlarging effusion. Small left effusion appears stable.  No acute osseous abnormality.  IMPRESSION:  Slight radiographic improvement in the left lower lobe infiltrate.  Stable small left effusion.   Original Report Authenticated By: Malachy Moan, M.D.   Dg Chest Port 1 View  05/07/2013   *RADIOLOGY REPORT*  Clinical Data: 77 year old male with respiratory distress, shortness of breath.  PORTABLE CHEST - 1 VIEW  Comparison: 05/06/2013 and earlier.  Findings: AP portable semi upright view at 0435 hours.  Since 04/05/2013, widespread airspace disease at the left lower and midlung has progressed.  Increased left  lung base density and obscuration of the left hemidiaphragm.  The right lung is stable.  Stable cardiac size and mediastinal contours.  Visualized tracheal air column is within normal limits.  IMPRESSION: Radiographically progressed left lung pneumonia since 05/05/2013. Dense left lower lobe consolidation.   Original Report Authenticated By: Erskine Speed, M.D.   Dg Chest Port 1 View  05/05/2013   *RADIOLOGY REPORT*  Clinical Data: 77 year old male shortness of breath and cough.  PORTABLE CHEST - 1 VIEW  Comparison: 05/04/2013 and earlier.  Findings: Portable semi upright AP view 0429 hours.  Stable lung volumes.  Stable cardiac size and mediastinal contours.  Mildly decreased patchy confluent basilar airspace disease, greater on the left .  No areas of worsening ventilation.  No pneumothorax or definite effusion.  IMPRESSION: Interval mild radiographic regression of left greater than right lung base airspace disease compatible with pneumonia.   Original Report Authenticated By: Erskine Speed, M.D.   Dg Chest Portable 1 View  05/04/2013   *RADIOLOGY REPORT*  Clinical Data: Hemoptysis.  Coughing up blood.  Recent removal of tube from the throat.  Short of breath.  PORTABLE CHEST - 1 VIEW  Comparison: 04/21/2013.  Findings: Cardiomegaly and median sternotomy.  Bioprosthetic aortic valve replacement.  Left greater than right coursed  airspace opacification is present radiating from the hilum to the bases. The distribution suggests aspiration.  In a patient with hemoptysis, vasculitic pulmonary hemorrhage is a consideration although the pattern is usually more fine in appearance.  IMPRESSION:  1.  Left greater than right basilar airspace disease most compatible with aspiration.  Pneumonia, pulmonary hemorrhage and edema are considered less likely. 2.  Median sternotomy and aortic valve replacement.   Original Report Authenticated By: Andreas Newport, M.D.   Dg Chest Port 1 View  04/21/2013   *RADIOLOGY REPORT*   Clinical Data: CHF, history COPD, hypertension, diabetes  PORTABLE CHEST - 1 VIEW  Comparison: Portable exam 1036 hours compared to 04/19/2013  Findings: Enlargement of cardiac silhouette post median sternotomy. Mediastinal contours and pulmonary vascularity normal. Lordotic positioning with underlying emphysematous changes noted. No definite acute infiltrate, pleural effusion, or pneumothorax. No acute osseous findings.  IMPRESSION: Enlargement of cardiac silhouette. Emphysematous changes consistent with COPD. No acute infiltrate.   Original Report Authenticated By: Ulyses Southward, M.D.   04/20/2013 2D ECHO Study Conclusions - Left ventricle: The cavity size was normal. Wall thickness was increased in a pattern of mild LVH. Systolic function was normal. The estimated ejection fraction was in the range of 55% to 60%. - Mitral valve: Calcified annulus. - Left atrium: The atrium was moderately dilated. - Right ventricle: The cavity size was mildly dilated. - Right atrium: The atrium was moderately dilated. - Atrial septum: No defect or patent foramen ovale was identified. - Pulmonary arteries: PA peak pressure: 47mm Hg (S). - Impressions: PA presssures may be underestimated by TR velocity. Morphologic signs of pulmonary hypertension with mild flattening of septum and RV dilatation Impressions: - PA presssures may be underestimated by TR velocity. Morphologic signs of pulmonary hypertension with mild flattenin gof septuma and RV dilatation  EKG: 87 bpm, atrial flutter with AB block  ASSESSMENT AND PLAN:  # Atrial fibrillation/flutter: transitioned from diltiazem gtt to oral diltiazem, initially 90 mg bid now increased to 120 mg bid. Currently rate 91-108 bpm on exam. . Risk factors for thromboembolism include age >38, male,  and hypertension. CHADS-VAS 3. Anticoagulation has been held secondary to hemoptysis. No beta-blockers in setting of sever COPD. Defer amiodarone while on high flow  oxygen. -cont hold anticoag given ongoiong hemoptysis -Cont Aspirin 81 mg qd.   Recent Labs Lab 05/13/13 1040 05/14/13 0355  TROPONINI <0.30 <0.30    # Acute on chronic respiratory failure: in setting of severe COPD and likely pneumonia, intubated for FOB --> extubated 05/13/2013, cont management per PCCM  Recent Labs Lab 05/12/13 1406 05/13/13 0155  PHART 7.344* 7.382  PCO2ART 69.6* 59.3*  PO2ART 158.0* 54.0*  HCO3 37.9* 35.3*  TCO2 40 37  O2SAT 99.0 86.0   # congestive heart failure, diastolic: EF 55-60%, mild LVH, morphologic signs of pulmonary hypertension on ECHO, PA pressure 47 mmHg but may be underestimated by TR velocity. Elevated pBNP,  no signs of volume overload on exam  Recent Labs Lab 05/13/13 0415  PROBNP 4038.0*   # hemoptysis: etiology unclear, Xarelto held, now resolved, Hgb stable after 1 UPRBC  Recent Labs Lab 05/12/13 0425 05/13/13 0415 05/14/13 0355 05/14/13 0800 05/15/13 0400  HGB 7.1* 7.8* 6.5* 7.8* 8.6*    # s/p bioprosthetic Aortic Valve replacement with MAZE: Aug 2011, normal coronaries on cath, 2D ECHO   # Acute Kidney Injury on chronic Renal insufficiency: creatine trending down  Recent Labs Lab 05/11/13 0410 05/12/13 0425 05/13/13 0415 05/14/13 0355 05/15/13 0400  CREATININE 1.86* 1.79* 1.60* 1.40* 1.48*     # Diabetes mellitus, Type 2: previously good control with HgbA1c 5.8, hyperglycemic in setting of steroid therapy CBG (last 3)   Recent Labs  05/14/13 1729 05/14/13 2135 05/15/13 0744  GLUCAP 240* 263* 225*    Signed, SCHOOLER, KAREN  05/15/2013, 9:21 AM  Patient seen with resident, agree with note. 1. Atrial fibrillation: Mild RVR.  I will change the diltiazem CD to 240 mg once daily, looks like his BP can tolerate this.  Can continue ASA 81 but would hold full anticoagulation for now with significant hemoptysis.  He fill followup with Dr. [place] who can decide on timing of resumption of anticoagulation once he  has recovered from the current episode.  2. Hemoptysis: In setting of PNA and Xarelto use, per CCM 3. Acute on chronic diastolic CHF: Volume overload on exam => elevated JVP.  Also elevated BNP.  Will given him Lasix 40 mg IV bid x 2 doses then reassess creatinine in the morning (has had mildly elevated creatinine).    Marca Ancona 05/15/2013 11:25 AM

## 2013-05-15 NOTE — Progress Notes (Addendum)
PULMONARY  / CRITICAL CARE MEDICINE  Name: [name] [name] MRN: [id number] DOB: 18-May-1936    ADMISSION DATE:  05/04/2013 CONSULTATION DATE:  05/04/2013  REFERRING MD :  Verlon Au PRIMARY SERVICE:  Hospitalist  CHIEF COMPLAINT:  hemoptysis  BRIEF PATIENT DESCRIPTION: 77 yo WM with hx of severe COPD, CHF (LVEF 55% to 60% on 4/21), and atrial flutter with recent hospitalization from 4/21-4/30 for SBo treated conservatively. Was on Xarelto and presented with hemoptysis pulmonary infiltrates initially attributed to HCAP.   SIGNIFICANT EVENTS / STUDIES:  5/4 - admitted with hemoptysis  5/5 - 5/7 persistent hemoptysis 5/7 CT chest: dense LLL consolidation, no evidence of tumor or mass 5/8 early AM - transferred to SDU with increased SOB 5/8 No further hemoptysis 5/9 increased dyspnea and worsening hypoxemia 5/13 Increasing O2 reqt's. Increased infiltrates. Increased WBC count. Elective intubation for FOB 5/13 One unit PRBCs for tachycardia, hypotension, Hgb 7.1 5/13 FOB: normal airway exam. No evidence of active bleeding. Minimal mucoid secretions. Cytology >> NO malignant cells identified 5/13 Hypotension due to sedatives, mech vent - vasopressors initiated 5/14 Vasculitis serologies ordered: ESR 44, ANA neg, GBM Ab neg, ANCA neg 5/14 Passed SBT. Extubated to NRB, PRN BiPAP 5/15 One unit PRBCs for Hgb 6.5 5/16 Transfer to [facility] / TUBES: ETT 5/13 >> 5/14 L IJ CVL 5/13 >>  R radial A line 5/13 >> 5/15  CULTURES: MRSA PCR 5/5 >> NEG Blood 5/5 >> NEG Resp 5/6 >> mod candida Strep Ag 5/6 >> NEG Legionella 5/6 >> NEG Resp 5/10 >> Mod Candida BAL bacterial cx 5/13 >> NEG BAL Pneumocystis >> NEG Urine 5/14 >> NEG BAL Fungal 5/13 >> smear negative >>  BAL Legionella >> smear negative >>  BAL AFB 5/13 >>  Blood 5/14 >>    PCT 5/5 > 0.21,   5/7 > 0.14,   5/9 > 0.41,   5/14 > 0.79,   5/15 >  0.37   ANTIBIOTICS: Cefepime 5/5 >>>5/5 Vanco 5/5 >> 5/9 Zoysn 5/5 >> 5/12 cipro 5/9  >> 5/12 Levofloxacin 5/12 >> 5/18 (stop date ordered) Fluconazole 5/12 >> 5/20 (stop date ordered)   SUBJECTIVE:  Cont to improve. No new complaints. No overt distress. Tolerating  O2  VITAL SIGNS: Temp:  [97.8 F (36.6 C)-98.6 F (37 C)] 98 F (36.7 C) (05/16 0800) Pulse Rate:  [43-124] 122 (05/16 0800) Resp:  [17-23] 20 (05/16 0800) BP: (99-132)/(50-88) 129/51 mmHg (05/16 0800) SpO2:  [89 %-99 %] 90 % (05/16 0834) FiO2 (%):  [50 %] 50 % (05/15 1600)   PHYSICAL EXAMINATION: General:  NAD Neuro:  No focal deficits HEENT:  WNL Cardiovascular: IRIR s M Lungs: diminished BS in BLL, bibasilar crackles, scattered end exp wheezes Abdomen:  Soft, nontender, NABS Ext: No clubbing, minimal ankle edema   Recent Labs Lab 05/13/13 0415 05/14/13 0355 05/15/13 0400  NA 130* 131* 132*  K 4.3 5.4* 5.4*  CL 93* 94* 95*  CO2 34* 33* 32  BUN 56* 48* 57*  CREATININE 1.60* 1.40* 1.48*  GLUCOSE 144* 225* 217*    Recent Labs Lab 05/14/13 0355 05/14/13 0800 05/15/13 0400  HGB 6.5* 7.8* 8.6*  HCT 20.2* 23.8* 26.2*  WBC 9.5 9.5 11.5*  PLT 122* 114* 149*   CXR:  No new film   ASSESSMENT / PLAN:  PULMONARY A:  Acute on chronic respiratory failure Hemoptysis, mild recurrence 5/16 Severe COPD Pulmonary infiltrates of unclear etiology Mediastinal LAN - likely reactive P: Wean O2 as tolerated for  SpO2 > 90% Cont BDs as scheduled Cont empiric steroids initiated 5/14 - reduce dose 5/16 PA/lat CXR ordered for 5/17 AM  CARDIOLOGY A: A fib, marginal rate control Hypotension, resolved Elevated BNP P: Increase dilt to home dose of 240 mg daily PRN metoprolol to maintain HR < 125/min Cont to hold anticoagulation for now due to recurrent hemoptysis Avoiding amiodarone while on high flow O2 Cards consult requested 5/16   RENAL A:  Acute on chronic renal insuff Active urinary sediment P: Monitor BMET Vasculitis serologies negative Avoid nephrotoxins and volume  depletion    INFECTIOUS DISEASE A: Presumed LLL PNA - little current evidence of active infection Leukocytosis, resolved Candida on 2 resp cultures P: Abx and micro as above   GI A:  Constipation Recent SBO treated conservatively P: LOC   Advance nutrition as tolerated  HEMATOLOGY A:  Anemia of acute illness. Thrombocytopenia. improved P: - F/U CBC. - transfuse for Hgb < 7.0 or for acute bleeding/symtpomatic anemia   ENDOCRINE A:  DM2 Hyperglycemia - anticipate worse control on systemic steroids P: Cont Lantus Cont SSI  Daughter and wife updated   Transfer to [facility]  Merton Border, MD ; [facility] [phone].  After 5:30 PM or weekends, call [phone]

## 2013-05-15 NOTE — Progress Notes (Signed)
Physical Therapy Treatment Patient Details Name: [name] [name] MRN: [id number] DOB: [birth year] Today's Date: 05/15/2013 Time: 4098-1191 PT Time Calculation (min): 25 min  PT Assessment / Plan / Recommendation Comments on Treatment Session  Limited ambulation this session due to noticeable decrease in SaO2 with standing to 81%.  Pt needed VCs on pursed lip breathing to increase over SaO2 to 89% on 6L.  Pt given HEP handout and encouraged pt to get up for all meals and to stand to urinate.      Follow Up Recommendations  Home health PT;Supervision/Assistance - 24 hour     Equipment Recommendations  Rolling walker with 5" wheels    Frequency Min 3X/week   Plan Discharge plan remains appropriate;Frequency remains appropriate    Precautions / Restrictions Precautions Precautions: Fall Restrictions Weight Bearing Restrictions: No   Pertinent Vitals/Pain No c/o pain;     Mobility  Bed Mobility Bed Mobility: Supine to Sit;Sitting - Scoot to Edge of Bed Supine to Sit: 4: Min assist;With rails Sitting - Scoot to [place] of Bed: 4: Min assist Details for Bed Mobility Assistance: (A) to elevate trunk OOB and cues for hand placement.  Pt needs extra time to complete task. Transfers Transfers: Sit to Stand;Stand to Sit Sit to Stand: 4: Min assist;From bed Stand to Sit: 4: Min assist;To chair/3-in-1 Details for Transfer Assistance: (A) to maintain balance and cues for hand placement. Ambulation/Gait Ambulation/Gait Assistance: 4: Min assist (+2 for lines) Ambulation Distance (Feet): 5 Feet (limited due to SaO2) Assistive device: Rolling walker Ambulation/Gait Assistance Details: Limited ambulation today due to decrease in Sa02 to 81% on 6L in standing.   Gait Pattern: Step-through pattern;Decreased stride length;Trunk flexed Gait velocity: slow    Exercises Total Joint Exercises Gluteal Sets: Strengthening;Both;10 reps Long Arc Quad: AROM;Both;20 reps;Seated General Exercises - Lower  Extremity Hip Flexion/Marching: Strengthening;10 reps;Seated;Both   PT Diagnosis:    PT Problem List:   PT Treatment Interventions:     PT Goals Acute Rehab PT Goals PT Goal Formulation: With patient Time For Goal Achievement: 05/20/13 Potential to Achieve Goals: Good Pt will go Supine/Side to Sit: with modified independence;Other (comment) PT Goal: Supine/Side to Sit - Progress: Progressing toward goal Pt will go Sit to Supine/Side: with modified independence;with HOB 0 degrees;with rail PT Goal: Sit to Supine/Side - Progress: Progressing toward goal Pt will Ambulate: >150 feet;with least restrictive assistive device;with supervision PT Goal: Ambulate - Progress: Not met  Visit Information  Last PT Received On: 05/15/13 Assistance Needed: +2 (ambulation)    Subjective Data  Subjective: I'm not sure I can get up.  My legs feel so heavy." Patient Stated Goal: To feel better and go home   Cognition  Cognition Arousal/Alertness: Awake/alert Behavior During Therapy: Flat affect Overall Cognitive Status: Within Functional Limits for tasks assessed    Balance     End of Session PT - End of Session Equipment Utilized During Treatment: Gait belt;Oxygen (6L) Activity Tolerance: Patient limited by fatigue Patient left: in chair;with call bell/phone within reach;with family/visitor present Nurse Communication: Mobility status   GP     Katai Marsico 05/15/2013, 10:47 AM Jake Shark, PT DPT [phone]

## 2013-05-16 ENCOUNTER — Inpatient Hospital Stay (HOSPITAL_COMMUNITY): Payer: Medicare Other

## 2013-05-16 DIAGNOSIS — I359 Nonrheumatic aortic valve disorder, unspecified: Secondary | ICD-10-CM

## 2013-05-16 DIAGNOSIS — D62 Acute posthemorrhagic anemia: Secondary | ICD-10-CM

## 2013-05-16 DIAGNOSIS — N189 Chronic kidney disease, unspecified: Secondary | ICD-10-CM

## 2013-05-16 LAB — BASIC METABOLIC PANEL
BUN: 65 mg/dL — ABNORMAL HIGH (ref 6–23)
CO2: 34 mEq/L — ABNORMAL HIGH (ref 19–32)
Chloride: 95 mEq/L — ABNORMAL LOW (ref 96–112)
Creatinine, Ser: 1.67 mg/dL — ABNORMAL HIGH (ref 0.50–1.35)
GFR calc Af Amer: 42 mL/min — ABNORMAL LOW (ref >90)
GFR calc non Af Amer: 37 mL/min — ABNORMAL LOW (ref >90)
Glucose, Bld: 263 mg/dL — ABNORMAL HIGH (ref 70–99)
Glucose, Bld: 212 mg/dL — ABNORMAL HIGH (ref 70–99)
Potassium: 5.7 mEq/L — ABNORMAL HIGH (ref 3.5–5.1)
Sodium: 133 mEq/L — ABNORMAL LOW (ref 135–145)

## 2013-05-16 LAB — CBC
Hemoglobin: 8.1 g/dL — ABNORMAL LOW (ref 13.0–17.0)
MCHC: 32.3 g/dL (ref 30.0–36.0)
RBC: 2.74 MIL/uL — ABNORMAL LOW (ref 4.22–5.81)

## 2013-05-16 LAB — PROTIME-INR: INR: 1.31 (ref 0.00–1.49)

## 2013-05-16 LAB — GLUCOSE, CAPILLARY
Glucose-Capillary: 252 mg/dL — ABNORMAL HIGH (ref 70–99)
Glucose-Capillary: 253 mg/dL — ABNORMAL HIGH (ref 70–99)

## 2013-05-16 LAB — PRO B NATRIURETIC PEPTIDE: Pro B Natriuretic peptide (BNP): 5765 pg/mL — ABNORMAL HIGH (ref 0–450)

## 2013-05-16 MED ORDER — SODIUM POLYSTYRENE SULFONATE 15 GM/60ML PO SUSP
30.0000 g | Freq: Once | ORAL | Status: AC
  Administered 2013-05-16: 30 g via ORAL
  Filled 2013-05-16: qty 120

## 2013-05-16 MED ORDER — DILTIAZEM HCL ER COATED BEADS 240 MG PO CP24
240.0000 mg | ORAL_CAPSULE | Freq: Every day | ORAL | Status: DC
  Administered 2013-05-16 – 2013-05-26 (×11): 240 mg via ORAL
  Filled 2013-05-16 (×12): qty 1

## 2013-05-16 MED ORDER — FUROSEMIDE 10 MG/ML IJ SOLN
40.0000 mg | Freq: Every day | INTRAMUSCULAR | Status: DC
  Administered 2013-05-16: 40 mg via INTRAVENOUS
  Filled 2013-05-16 (×2): qty 4

## 2013-05-16 MED ORDER — INSULIN GLARGINE 100 UNIT/ML ~~LOC~~ SOLN
20.0000 [IU] | Freq: Every day | SUBCUTANEOUS | Status: DC
  Administered 2013-05-16 – 2013-05-18 (×3): 20 [IU] via SUBCUTANEOUS
  Filled 2013-05-16 (×3): qty 0.2

## 2013-05-16 NOTE — Progress Notes (Signed)
PULMONARY  / CRITICAL CARE MEDICINE  Name: [name] [name] MRN: [id number] DOB: 1936-11-09    ADMISSION DATE:  05/04/2013 CONSULTATION DATE:  05/04/2013  REFERRING MD :  Verlon Au PRIMARY SERVICE:  Hospitalist  CHIEF COMPLAINT:  hemoptysis  BRIEF PATIENT DESCRIPTION: 77 yo WM with hx of severe COPD, CHF (LVEF 55% to 60% on 4/21), and atrial flutter with recent hospitalization from 4/21-4/30 for SBo treated conservatively. Was on Xarelto and presented with hemoptysis pulmonary infiltrates initially attributed to HCAP.   SIGNIFICANT EVENTS / STUDIES:  5/4 - admitted with hemoptysis  5/5 - 5/7 persistent hemoptysis 5/7 CT chest: dense LLL consolidation, no evidence of tumor or mass 5/8 early AM - transferred to SDU with increased SOB 5/8 No further hemoptysis 5/9 increased dyspnea and worsening hypoxemia 5/13 Increasing O2 reqt's. Increased infiltrates. Increased WBC count. Elective intubation for FOB 5/13 One unit PRBCs for tachycardia, hypotension, Hgb 7.1 5/13 FOB: normal airway exam. No evidence of active bleeding. Minimal mucoid secretions. Cytology >> NO malignant cells identified 5/13 Hypotension due to sedatives, mech vent - vasopressors initiated 5/14 Vasculitis serologies ordered: ESR 44, ANA neg, GBM Ab neg, ANCA neg 5/14 Passed SBT. Extubated to NRB, PRN BiPAP 5/15 One unit PRBCs for Hgb 6.5 5/16 Transfer to [place] / TUBES: ETT 5/13 >> 5/14 L IJ CVL 5/13 >>  R radial A line 5/13 >> 5/15  CULTURES: MRSA PCR 5/5 >> NEG Blood 5/5 >> NEG Resp 5/6 >> mod candida Strep Ag 5/6 >> NEG Legionella 5/6 >> NEG Resp 5/10 >> Mod Candida BAL bacterial cx 5/13 >> NEG BAL Pneumocystis >> NEG Urine 5/14 >> NEG BAL Fungal 5/13 >> smear negative >>  BAL Legionella >> smear negative >>  BAL AFB 5/13 >>  Blood 5/14 >>    PCT 5/5 > 0.21,   5/7 > 0.14,   5/9 > 0.41,   5/14 > 0.79,   5/15 >  0.37   ANTIBIOTICS: Cefepime 5/5 >>>5/5 Vanco 5/5 >> 5/9 Zoysn 5/5 >> 5/12 cipro  5/9 >> 5/12 Levofloxacin 5/12 >> 5/18 (stop date ordered) Fluconazole 5/12 >> 5/20 (stop date ordered)   SUBJECTIVE:  Cont to improve. No new complaints. No overt distress. Tolerating [place] O2  VITAL SIGNS: Temp:  [97.6 F (36.4 C)-98.3 F (36.8 C)] 98.1 F (36.7 C) (05/17 0351) Pulse Rate:  [45-131] 95 (05/17 0351) Resp:  [20-23] 23 (05/17 0351) BP: (111-129)/(51-76) 111/57 mmHg (05/17 0351) SpO2:  [89 %-95 %] 93 % (05/17 0351) Weight:  [176 lb 4.8 oz (79.969 kg)] 176 lb 4.8 oz (79.969 kg) (05/17 0351)   PHYSICAL EXAMINATION: General:  NAD Neuro:  No focal deficits HEENT:  WNL Cardiovascular: IRIR s M Lungs: diminished BS in BLL, bibasilar crackles, scattered end exp wheezes Abdomen:  Soft, nontender, NABS Ext: No clubbing, minimal ankle edema   Recent Labs Lab 05/14/13 0355 05/15/13 0400 05/16/13 0420  NA 131* 132* 133*  K 5.4* 5.4* 5.7*  CL 94* 95* 94*  CO2 33* 32 32  BUN 48* 57* 65*  CREATININE 1.40* 1.48* 1.74*  GLUCOSE 225* 217* 263*    Recent Labs Lab 05/14/13 0800 05/15/13 0400 05/16/13 0420  HGB 7.8* 8.6* 8.1*  HCT 23.8* 26.2* 25.1*  WBC 9.5 11.5* 9.4  PLT 114* 149* 176    CXR 5/15:  IMPRESSION:  Endotracheal tube removed. No change in aeration. Persistent patchy opacities bilaterally.    ASSESSMENT / PLAN:  PULMONARY A:  Acute on chronic respiratory failure Hemoptysis,  mild recurrence 5/16- neg bronch w/o bleeding site found Severe COPD Pulmonary infiltrates of unclear etiology- bilat L>R opacities Mediastinal LAN - likely reactive P: ~ on Solumed40Q12h, Levaquin500PO, DiflucanPO, NEBS, IS/Flutter>> Wean O2 as tolerated for SpO2 > 90% Cont BDs as scheduled Cont empiric steroids initiated 5/14  PA/lat CXR ordered for 5/17 AM=> pending CBC 5/17 w/ wbc=9.4   CARDIOLOGY A: A fib/flutter, marginal rate control s/p bioprosthetic Aortic Valve replacement with MAZE (Aug 2011, normal coronaries on cath) Hypotension, resolved Elevated  BNP P: ~ on Lasix40IVBid, Diltiazem240Bid, Lovenox30, ASA81>> Looks like DrMcLean & DrSimonds meant for him to be on CardizemCD240/d- will fix this... PRN metoprolol to maintain HR < 125/min Cont to hold anticoagulation for now due to recurrent hemoptysis Avoiding amiodarone while on high flow O2 Cards consult 5/16 from [facility] apprec.   RENAL A:  Acute on chronic renal insuff Active urinary sediment P: ~  Lab 5/17 showed K=5.7, BUN=65, Creat=1.7 on Lasix40IVBid Monitor BMET Vasculitis serologies negative Avoid nephrotoxins and volume depletion    INFECTIOUS DISEASE A: Presumed LLL PNA - little current evidence of active infection Leukocytosis, resolved Candida on 2 resp cultures P: ~  On [place]... Abx and micro as above   GI A:  Constipation Recent SBO treated conservatively P: LOC   Advance nutrition as tolerated   HEMATOLOGY A:  Anemia of acute illness. Thrombocytopenia. improved P: ~  S/p Tx 1u x2 so far this adm... F/U CBC=> Hg 5/17= 8.1  transfuse for Hgb < 7.0 or for acute bleeding/symtpomatic anemia   ENDOCRINE A:  DM2 - note A1c was 5.8 in April. Hyperglycemia - anticipate worse control on systemic steroids P: ~ on Lantus15 & Diabeta2.5Bid & SSI... BS=263 on his current meds +Solumedrol dose, and we will incr Lantus to 20, continue others...   Deborra Medina. Lenna Gilford, MD 05/16/13 @ 7:26AM

## 2013-05-16 NOTE — Progress Notes (Signed)
OT Cancellation Note  Patient Details Name: [name] [name] MRN: [id number] DOB: 10/07/1936   Cancelled Treatment:    Reason Eval/Treat Not Completed: Fatigue/lethargy limiting ability to participate. Pt had just transferred to chair with help of nursing/PT and declining OT session. Will re-attempt later this afternoon as time allows.  05/16/2013 Cipriano Mile OTR/L Pager [phone] Office [phone]

## 2013-05-16 NOTE — Progress Notes (Addendum)
Patient was being assisted to transfer from [facility] to chair, being assisted by Nurse Hedy Jacob NT, had assisted pt on yesterday 5/16 as a 1 person assist. Today pt being assisted by Herbert Seta and pt was unable to stand with 1 person assist, pt's knees buckled, pt lowered safely to kneeling position and then assisted up from floor by Edrick Oh RN and Herbert Seta to chair, no injury noted, Berle Mull RN

## 2013-05-16 NOTE — Progress Notes (Addendum)
   Subjective:  Denies CP; dyspneic this AM; still with intermittent hemoptysis.   Objective:  Filed Vitals:   05/15/13 1502 05/15/13 1925 05/15/13 2047 05/16/13 0351  BP:   122/76 111/57  Pulse:   45 95  Temp:   98.2 F (36.8 C) 98.1 F (36.7 C)  TempSrc:   Oral Oral  Resp:   22 23  Height:      Weight:    176 lb 4.8 oz (79.969 kg)  SpO2: 94% 93% 95% 93%    Intake/Output from previous day:  Intake/Output Summary (Last 24 hours) at 05/16/13 0856 Last data filed at 05/16/13 0800  Gross per 24 hour  Intake    363 ml  Output   1901 ml  Net  -1538 ml    Physical Exam: Physical exam: Well-developed well-nourished in mild respiratory distress.  Skin is warm and dry.  HEENT is normal.  Neck is supple.  Chest with diminished BS throughout Cardiovascular exam is irregular Abdominal exam nontender or distended. No masses palpated. Extremities show 1+ edema. neuro grossly intact    Lab Results: Basic Metabolic Panel:  Recent Labs  45/40/98 0400 05/16/13 0420  NA 132* 133*  K 5.4* 5.7*  CL 95* 94*  CO2 32 32  GLUCOSE 217* 263*  BUN 57* 65*  CREATININE 1.48* 1.74*  CALCIUM 8.3* 8.6   CBC:  Recent Labs  05/15/13 0400 05/16/13 0420  WBC 11.5* 9.4  HGB 8.6* 8.1*  HCT 26.2* 25.1*  MCV 91.6 91.6  PLT 149* 176   Cardiac Enzymes:  Recent Labs  05/13/13 1040 05/14/13 0355  TROPONINI <0.30 <0.30     Assessment/Plan:  1 atrial fibrillation-the patient's rate remains mildly elevated. His Cardizem was increased to 240 mg daily with first dose today. We will await results of this intervention before making additional changes. Continue aspirin. Not on anticoagulation at present given persistent hemoptysis. 2 acute diastolic congestive heart failure-the patient is volume overloaded. We'll continue Lasix at 40 mg daily; watch renal function and K closely. His atrial fibrillation is most likely contributing. However we cannot consider cardioversion as we cannot  anticoagulate at this point. 3 acute renal failure-follow renal function closely. May need nephrology consult. 4 COPD-management per pulmonary. 5 hemoptysis-evaluation ongoing by pulmonary. 6 Hyperkalemia - will give kayexalate x 1; recheck K at 3 PM  [facility] 05/16/2013, 8:56 AM

## 2013-05-16 NOTE — Plan of Care (Signed)
Problem: Phase I Progression Outcomes Goal: Pain controlled with appropriate interventions Outcome: Completed/Met Date Met:  05/16/13 No complaints of pain

## 2013-05-16 NOTE — Progress Notes (Signed)
Physical Therapy Treatment Patient Details Name: [name] [name] MRN: [id number] DOB: [birth year] Today's Date: 05/16/2013 Time: 4098-1191 PT Time Calculation (min): 14 min  PT Assessment / Plan / Recommendation Comments on Treatment Session  PT responding to request for assistance s/p patient fall. Nsg with patient. Assisted nsg with transfer and positioning. Wife reports patient much weaker today and increased swelling in bilateral LEs. Spoke with pt and spouse regarding techniques for elevating LEs to reduce edema as well as therapeutic exercises that will assist with circulation and edema control. Patient performed ther-ex without difficulty. Pt will benefit from continued skilled PT to address deficits further.     Follow Up Recommendations  Home health PT;Supervision/Assistance - 24 hour           Equipment Recommendations  Rolling walker with 5" wheels       Frequency Min 3X/week   Plan Discharge plan remains appropriate;Frequency remains appropriate    Precautions / Restrictions Precautions Precautions: Fall Restrictions Weight Bearing Restrictions: No   Pertinent Vitals/Pain No pain    Mobility  Transfers Transfers: Sit to Stand;Stand to Sit;Stand Pivot Transfers Sit to Stand: 3: Mod assist Stand to Sit: 3: Mod assist Stand Pivot Transfers: 2: Max assist Details for Transfer Assistance: (A) to stand upright, weakness in bilateral LEs    Exercises Total Joint Exercises Ankle Circles/Pumps: Both;10 reps Straight Leg Raises: Both;10 reps      PT Goals Acute Rehab PT Goals PT Goal Formulation: With patient Time For Goal Achievement: 05/20/13 Potential to Achieve Goals: Good Pt will go Supine/Side to Sit: with modified independence;Other (comment) Pt will go Sit to Supine/Side: with modified independence;with HOB 0 degrees;with rail Pt will Ambulate: >150 feet;with least restrictive assistive device;with supervision Pt will Go Up / Down Stairs: 3-5 stairs;with  supervision;with least restrictive assistive device Pt will Perform Home Exercise Program: Independently PT Goal: Perform Home Exercise Program - Progress: Progressing toward goal  Visit Information  Last PT Received On: 05/16/13 Assistance Needed: +2    Subjective Data  Subjective: Nsg request assist for patient who has falled, pt states no pain from fall   Cognition  Cognition Arousal/Alertness: Awake/alert Behavior During Therapy: WFL for tasks assessed/performed Overall Cognitive Status: Within Functional Limits for tasks assessed    Balance  Static Standing Balance Static Standing - Balance Support: Bilateral upper extremity supported Static Standing - Level of Assistance: 1: +1 Total assist  End of Session PT - End of Session Equipment Utilized During Treatment: Gait belt;Oxygen (6L) Activity Tolerance: Patient limited by fatigue Patient left: in chair;with call bell/phone within reach;with family/visitor present Nurse Communication: Mobility status   GP     Fabio Asa 05/16/2013, 9:48 AM Charlotte Crumb, PT DPT  [phone]

## 2013-05-17 LAB — BASIC METABOLIC PANEL
Chloride: 98 mEq/L (ref 96–112)
GFR calc Af Amer: 63 mL/min — ABNORMAL LOW (ref >90)
Potassium: 4.3 mEq/L (ref 3.5–5.1)
Sodium: 141 mEq/L (ref 135–145)

## 2013-05-17 LAB — CBC
HCT: 24.6 % — ABNORMAL LOW (ref 39.0–52.0)
Hemoglobin: 7.8 g/dL — ABNORMAL LOW (ref 13.0–17.0)
WBC: 7.2 10*3/uL (ref 4.0–10.5)

## 2013-05-17 LAB — GLUCOSE, CAPILLARY: Glucose-Capillary: 244 mg/dL — ABNORMAL HIGH (ref 70–99)

## 2013-05-17 MED ORDER — FUROSEMIDE 10 MG/ML IJ SOLN
40.0000 mg | Freq: Two times a day (BID) | INTRAMUSCULAR | Status: DC
  Administered 2013-05-17 – 2013-05-21 (×10): 40 mg via INTRAVENOUS
  Filled 2013-05-17 (×12): qty 4

## 2013-05-17 NOTE — Progress Notes (Signed)
   Subjective:  Denies CP; dyspneic this AM; still with intermittent hemoptysis.   Objective:  Filed Vitals:   05/16/13 1409 05/16/13 1955 05/17/13 0416 05/17/13 0500  BP:  122/58 120/62   Pulse:  91 85   Temp:  98.3 F (36.8 C) 98.7 F (37.1 C)   TempSrc:  Oral Oral   Resp:  19 21   Height:      Weight:      SpO2: 91% 94% 95% 94%    Intake/Output from previous day:  Intake/Output Summary (Last 24 hours) at 05/17/13 0707 Last data filed at 05/17/13 0500  Gross per 24 hour  Intake    720 ml  Output   2101 ml  Net  -1381 ml    Physical Exam: Physical exam: Well-developed well-nourished in mild respiratory distress.  Skin is warm and dry.  HEENT is normal.  Neck is supple.  Chest with diminished BS throughout Cardiovascular exam is irregular Abdominal exam nontender or distended. No masses palpated. Extremities show 1+ edema. neuro grossly intact    Lab Results: Basic Metabolic Panel:  Recent Labs  16/10/96 1438 05/17/13 0418  NA 140 141  K 5.1 4.3  CL 95* 98  CO2 34* 37*  GLUCOSE 212* 212*  BUN 65* 55*  CREATININE 1.67* 1.26  CALCIUM 8.6 8.5   CBC:  Recent Labs  05/16/13 0420 05/17/13 0418  WBC 9.4 7.2  HGB 8.1* 7.8*  HCT 25.1* 24.6*  MCV 91.6 92.8  PLT 176 154   Cardiac Enzymes: No results found for this basename: CKTOTAL, CKMB, CKMBINDEX, TROPONINI,  in the last 72 hours   Assessment/Plan:  1 atrial fibrillation-the patient's rate is controlled; continue cardizem. Continue aspirin. Not on anticoagulation at present given persistent hemoptysis. 2 acute diastolic congestive heart failure-the patient remains volume overloaded. Will increase Lasix to 40 mg BID; watch renal function and K closely. His atrial fibrillation is most likely contributing. However we cannot consider cardioversion as we cannot anticoagulate at this point. 3 acute renal failure-follow renal function closely. 4 COPD-management per pulmonary. 5 hemoptysis-evaluation  ongoing by pulmonary. 6 Hyperkalemia - improved  Olga Millers 05/17/2013, 7:07 AM

## 2013-05-17 NOTE — Progress Notes (Signed)
PULMONARY  / CRITICAL CARE MEDICINE  Name: [name] [name] MRN: [id number] DOB: [birth year]    ADMISSION DATE:  05/04/2013 CONSULTATION DATE:  05/04/2013  REFERRING MD :  Verlon Au PRIMARY SERVICE:  Hospitalist  CHIEF COMPLAINT:  hemoptysis  BRIEF PATIENT DESCRIPTION: 77 yo WM with hx of severe COPD, CHF (LVEF 55% to 60% on 4/21), and atrial flutter with recent hospitalization from 4/21-4/30 for SBo treated conservatively. Was on Xarelto and presented with hemoptysis pulmonary infiltrates initially attributed to HCAP.   SIGNIFICANT EVENTS / STUDIES:  5/4 - admitted with hemoptysis  5/5 - 5/7 persistent hemoptysis 5/7 CT chest: dense LLL consolidation, no evidence of tumor or mass 5/8 early AM - transferred to SDU with increased SOB 5/8 No further hemoptysis 5/9 increased dyspnea and worsening hypoxemia 5/13 Increasing O2 reqt's. Increased infiltrates. Increased WBC count. Elective intubation for FOB 5/13 One unit PRBCs for tachycardia, hypotension, Hgb 7.1 5/13 FOB: normal airway exam. No evidence of active bleeding. Minimal mucoid secretions. Cytology >> NO malignant cells identified 5/13 Hypotension due to sedatives, mech vent - vasopressors initiated 5/14 Vasculitis serologies ordered: ESR 44, ANA neg, GBM Ab neg, ANCA neg 5/14 Passed SBT. Extubated to NRB, PRN BiPAP 5/15 One unit PRBCs for Hgb 6.5 5/16 Transfer to [place] / TUBES: ETT 5/13 >> 5/14 L IJ CVL 5/13 >>  R radial A line 5/13 >> 5/15  CULTURES: MRSA PCR 5/5 >> NEG Blood 5/5 >> NEG Resp 5/6 >> mod candida Strep Ag 5/6 >> NEG Legionella 5/6 >> NEG Resp 5/10 >> Mod Candida BAL bacterial cx 5/13 >> NEG BAL Pneumocystis >> NEG Urine 5/14 >> NEG BAL Fungal 5/13 >> smear negative >>  BAL Legionella >> smear negative >>  BAL AFB 5/13 >>  Blood 5/14 >>    PCT 5/5 > 0.21,   5/7 > 0.14,   5/9 > 0.41,   5/14 > 0.79,   5/15 >  0.37   ANTIBIOTICS: Cefepime 5/5 >>>5/5 Vanco 5/5 >> 5/9 Zoysn 5/5 >> 5/12 cipro  5/9 >> 5/12 Levofloxacin 5/12 >> 5/18 (stop date ordered) Fluconazole 5/12 >> 5/20 (stop date ordered)   SUBJECTIVE:  Cont to improve. No new complaints. No overt distress. Tolerating [place] O2  VITAL SIGNS: Temp:  [97.6 F (36.4 C)-98.7 F (37.1 C)] 98.7 F (37.1 C) (05/18 0416) Pulse Rate:  [76-91] 85 (05/18 0416) Resp:  [19-21] 21 (05/18 0416) BP: (114-122)/(58-77) 120/62 mmHg (05/18 0416) SpO2:  [89 %-95 %] 94 % (05/18 0500)   PHYSICAL EXAMINATION: General:  NAD Neuro:  No focal deficits HEENT:  WNL Cardiovascular: IRIR s M Lungs: diminished BS in BLL, bibasilar crackles, scattered end exp wheezes Abdomen:  Soft, nontender, NABS Ext: No clubbing, minimal ankle edema   Recent Labs Lab 05/16/13 0420 05/16/13 1438 05/17/13 0418  NA 133* 140 141  K 5.7* 5.1 4.3  CL 94* 95* 98  CO2 32 34* 37*  BUN 65* 65* 55*  CREATININE 1.74* 1.67* 1.26  GLUCOSE 263* 212* 212*    Recent Labs Lab 05/15/13 0400 05/16/13 0420 05/17/13 0418  HGB 8.6* 8.1* 7.8*  HCT 26.2* 25.1* 24.6*  WBC 11.5* 9.4 7.2  PLT 149* 176 154    CXR 5/15:  IMPRESSION:  Endotracheal tube removed. No change in aeration. Persistent patchy opacities bilaterally.  CXR 5/17:  IMPRESSION:  Minimal improvement in bilateral interstitial and airspace opacities. Infection versus pulmonary edema.    ASSESSMENT / PLAN:  PULMONARY A:  Acute on chronic respiratory  failure Hemoptysis, mild recurrence 5/16- neg bronch w/o bleeding site found Severe COPD Pulmonary infiltrates of unclear etiology- bilat L>R opacities Mediastinal LAN - likely reactive P: ~ on Solumed40Q12h, Levaquin500PO, DiflucanPO, NEBS, IS/Flutter>> Wean O2 as tolerated for SpO2 > 90% Cont BDs as scheduled Cont empiric steroids initiated 5/14  PA/lat CXR ordered for 5/17 AM=> min improvement. CBC 5/18 w/ wbc=7.2 ~  Plan f/u CXR/ labs in am 5/19   CARDIOLOGY A: A fib/flutter, better rate control s/p bioprosthetic Aortic Valve  replacement with MAZE (Aug 2011, normal coronaries on cath) Hypotension, resolved Elevated BNP P: ~ on Lasix40IVBid, Diltiazem240Bid, Lovenox30, ASA81>> PRN metoprolol to maintain HR < 125/min Cont to hold anticoagulation for now due to recurrent hemoptysis Avoiding amiodarone while on high flow O2 Cards consult 5/16 from [facility] apprec. ~ Creat improved to 1.26, will continue Lasix40Bid, recheck BNP   RENAL A:  Acute on chronic renal insuff Active urinary sediment P: ~  Lab 5/18 improved w K=4.3, BUN=55, Creat=1.26 on Lasix40IVBid, continue same. Monitor BMET Vasculitis serologies negative Avoid nephrotoxins and volume depletion    INFECTIOUS DISEASE A: Presumed LLL PNA - little current evidence of active infection Leukocytosis, resolved Candida on 2 resp cultures P: ~  On [place]... Abx and micro as above   GI A:  Constipation Recent SBO treated conservatively P: LOC   Advance nutrition as tolerated   HEMATOLOGY A:  Anemia of acute illness. Thrombocytopenia. improved P: ~  S/p Tx 1u x2 so far this adm... F/U CBC=> Hg 5/18= 7.8 transfuse for Hgb < 7.0 or for acute bleeding/symtpomatic anemia   ENDOCRINE A:  DM2 - note A1c was 5.8 in April. Hyperglycemia - anticipate worse control on systemic steroids (Solumed40mg  Q12h) P: ~ on Lantus20 & Diabeta2.5Bid & SSI... BS=212 on his current meds +Solumedrol dose, and we will continue Lantus, Diabeta, SSI...   Deborra Medina. Lenna Gilford, MD 05/17/13 @ 7:49AM

## 2013-05-18 ENCOUNTER — Inpatient Hospital Stay (HOSPITAL_COMMUNITY): Payer: Medicare Other

## 2013-05-18 DIAGNOSIS — I4892 Unspecified atrial flutter: Secondary | ICD-10-CM

## 2013-05-18 LAB — CBC WITH DIFFERENTIAL/PLATELET
Basophils Relative: 0 % (ref 0–1)
Eosinophils Absolute: 0 10*3/uL (ref 0.0–0.7)
Eosinophils Relative: 0 % (ref 0–5)
MCH: 29.9 pg (ref 26.0–34.0)
MCHC: 31.9 g/dL (ref 30.0–36.0)
MCV: 93.5 fL (ref 78.0–100.0)
Monocytes Relative: 5 % (ref 3–12)
Neutrophils Relative %: 91 % — ABNORMAL HIGH (ref 43–77)
Platelets: 150 10*3/uL (ref 150–400)

## 2013-05-18 LAB — BASIC METABOLIC PANEL
BUN: 52 mg/dL — ABNORMAL HIGH (ref 6–23)
Calcium: 8.2 mg/dL — ABNORMAL LOW (ref 8.4–10.5)
GFR calc non Af Amer: 61 mL/min — ABNORMAL LOW (ref >90)
Glucose, Bld: 209 mg/dL — ABNORMAL HIGH (ref 70–99)

## 2013-05-18 LAB — GLUCOSE, CAPILLARY: Glucose-Capillary: 301 mg/dL — ABNORMAL HIGH (ref 70–99)

## 2013-05-18 LAB — LEGIONELLA CULTURE

## 2013-05-18 MED ORDER — PREDNISONE 20 MG PO TABS
40.0000 mg | ORAL_TABLET | Freq: Every day | ORAL | Status: DC
  Filled 2013-05-18: qty 2

## 2013-05-18 MED ORDER — INSULIN GLARGINE 100 UNIT/ML ~~LOC~~ SOLN
25.0000 [IU] | Freq: Every day | SUBCUTANEOUS | Status: DC
  Administered 2013-05-19 – 2013-05-21 (×3): 25 [IU] via SUBCUTANEOUS
  Filled 2013-05-18 (×3): qty 0.25

## 2013-05-18 MED ORDER — PREDNISONE 20 MG PO TABS
30.0000 mg | ORAL_TABLET | Freq: Every day | ORAL | Status: DC
  Administered 2013-05-19 – 2013-05-21 (×3): 30 mg via ORAL
  Filled 2013-05-18 (×5): qty 1

## 2013-05-18 NOTE — Progress Notes (Signed)
SUBJECTIVE:  Breathing better this am.  Very weak.   PHYSICAL EXAM Filed Vitals:   05/17/13 1401 05/17/13 2000 05/18/13 0046 05/18/13 0359  BP: 122/67 113/66 134/62 124/54  Pulse: 92 94 85 88  Temp: 98 F (36.7 C) 97.8 F (36.6 C) 98.1 F (36.7 C) 97.6 F (36.4 C)  TempSrc: Oral Oral Oral Oral  Resp: 20 21 20 19   Height:      Weight:    169 lb 11.2 oz (76.975 kg)  SpO2: 99% 95% 95% 93%   General:  No distress Lungs:  Bilateral decreased breath sounds with few wheezes Heart:  Irregular Abdomen:  Positive bowel sounds, no rebound no guarding Extremities:  No edema  LABS:  Results for orders placed during the hospital encounter of 05/04/13 (from the past 24 hour(s))  GLUCOSE, CAPILLARY     Status: Abnormal   Collection Time    05/17/13 11:51 AM      Result Value Range   Glucose-Capillary 211 (*) 70 - 99 mg/dL  GLUCOSE, CAPILLARY     Status: Abnormal   Collection Time    05/17/13  4:27 PM      Result Value Range   Glucose-Capillary 235 (*) 70 - 99 mg/dL  GLUCOSE, CAPILLARY     Status: Abnormal   Collection Time    05/17/13  8:45 PM      Result Value Range   Glucose-Capillary 244 (*) 70 - 99 mg/dL   Comment 1 Documented in Chart     Comment 2 Notify RN    BASIC METABOLIC PANEL     Status: Abnormal   Collection Time    05/18/13  4:40 AM      Result Value Range   Sodium 140  135 - 145 mEq/L   Potassium 4.0  3.5 - 5.1 mEq/L   Chloride 96  96 - 112 mEq/L   CO2 37 (*) 19 - 32 mEq/L   Glucose, Bld 209 (*) 70 - 99 mg/dL   BUN 52 (*) 6 - 23 mg/dL   Creatinine, Ser 9.60  0.50 - 1.35 mg/dL   Calcium 8.2 (*) 8.4 - 10.5 mg/dL   GFR calc non Af Amer 61 (*) >90 mL/min   GFR calc Af Amer 71 (*) >90 mL/min  CBC WITH DIFFERENTIAL     Status: Abnormal   Collection Time    05/18/13  4:40 AM      Result Value Range   WBC 9.6  4.0 - 10.5 K/uL   RBC 2.78 (*) 4.22 - 5.81 MIL/uL   Hemoglobin 8.3 (*) 13.0 - 17.0 g/dL   HCT 45.4 (*) 09.8 - 11.9 %   MCV 93.5  78.0 - 100.0 fL   MCH 29.9  26.0 - 34.0 pg   MCHC 31.9  30.0 - 36.0 g/dL   RDW 14.7 (*) 82.9 - 56.2 %   Platelets 150  150 - 400 K/uL   Neutrophils Relative % 91 (*) 43 - 77 %   Neutro Abs 8.7 (*) 1.7 - 7.7 K/uL   Lymphocytes Relative 4 (*) 12 - 46 %   Lymphs Abs 0.4 (*) 0.7 - 4.0 K/uL   Monocytes Relative 5  3 - 12 %   Monocytes Absolute 0.5  0.1 - 1.0 K/uL   Eosinophils Relative 0  0 - 5 %   Eosinophils Absolute 0.0  0.0 - 0.7 K/uL   Basophils Relative 0  0 - 1 %   Basophils Absolute 0.0  0.0 -  0.1 K/uL  PRO B NATRIURETIC PEPTIDE     Status: Abnormal   Collection Time    05/18/13  4:40 AM      Result Value Range   Pro B Natriuretic peptide (BNP) 5846.0 (*) 0 - 450 pg/mL  GLUCOSE, CAPILLARY     Status: Abnormal   Collection Time    05/18/13  6:03 AM      Result Value Range   Glucose-Capillary 211 (*) 70 - 99 mg/dL   Comment 1 Notify RN     Comment 2 Documented in Chart      Intake/Output Summary (Last 24 hours) at 05/18/13 0747 Last data filed at 05/18/13 0640  Gross per 24 hour  Intake      3 ml  Output   3075 ml  Net  -3072 ml    ASSESSMENT AND PLAN:  Atrial fibrillation:  No anticoagulation with hemoptysis.  Tele reviewed.  Good rate control on current medications.    Acute on chronic diastolic HF:  Excellent urine output yesterday with stable creatinine.  CXR with persistent bilateral infiltrates.   Continue Lasix IV BID today.    Fayrene Fearing [facility] 05/18/2013 7:47 AM

## 2013-05-18 NOTE — Progress Notes (Signed)
Clinical Social Work Department BRIEF PSYCHOSOCIAL ASSESSMENT 05/18/2013  Patient:  [name] [name], [name] [name]     Account Number:  [id number]     Admit date:  05/04/2013  Clinical Social Worker:  Orpah Greek  Date/Time:  05/18/2013 04:12 PM  Referred by:  Physician  Date Referred:  05/18/2013 Referred for  SNF Placement   Other Referral:   Interview type:  Patient Other interview type:   and wife at bedside    PSYCHOSOCIAL DATA Living Status:  WIFE Admitted from facility:   Level of care:   Primary support name:  Harrell Niehoff (wife) h#: [phone] [phone] Primary support relationship to patient:  SPOUSE Degree of support available:   good    CURRENT CONCERNS Current Concerns  Post-Acute Placement   Other Concerns:    SOCIAL WORK ASSESSMENT / PLAN CSW spoke with patient/wife at bedside re: discharge planning. Patient's wife has been planning to take patient home with home health services but per PT recommendation - may need SNF at discharge.   Assessment/plan status:  Information/Referral to [facility] Other assessment/ plan:   Information/referral to community resources:   CSW provided list of facilities in [place].    PATIENT'S/FAMILY'S RESPONSE TO PLAN OF CARE: Patient's wife states patient has been to [facility] in the past & requesting there if SNF is needed at discharge.       Unice Bailey, LCSW [facility] Clinical Social Worker cell #: [phone]

## 2013-05-18 NOTE — Progress Notes (Signed)
Physical Therapy Treatment Patient Details Name: [name] [name] MRN: [id number] DOB: Aug 02, 1936 Today's Date: 05/18/2013 Time: 4098-1191 PT Time Calculation (min): 32 min  PT Assessment / Plan / Recommendation Comments on Treatment Session  Pt very limited with activities secondary to poor SpO2 levels. Patient desaturated with standing to 81% on 5 liters, rebounded to 94% with extended seated rest break. Intermittant confusion while standing. Nsg advised of low O2 levels. Pt did tolerated seated ther-ex well with good recall during teach back activites. Recommend continued ther-ex throughout the day. Will continue to see and progress activity as tolerated. If patient does not progress with activity and functional mobility may need to reconsider ST SNF placement upon discharge.    Follow Up Recommendations  Home health PT;Supervision/Assistance - 24 hour (may need to reconsider ST SNF if not progressing)     Does the patient have the potential to tolerate intense rehabilitation     Barriers to Discharge        Equipment Recommendations  Rolling walker with 5" wheels    Recommendations for Other Services    Frequency Min 3X/week   Plan Discharge plan remains appropriate;Frequency remains appropriate (may need to reconsider ST SNF if not progressing)    Precautions / Restrictions Precautions Precautions: Fall Restrictions Weight Bearing Restrictions: No   Pertinent Vitals/Pain No pain; SpO2 81% on 5 liters with minimal activity; 94% on 5 liters at rest    Mobility  Transfers Transfers: Sit to Stand;Stand to Sit;Stand Pivot Transfers Sit to Stand: 3: Mod assist;With upper extremity assist;With armrests;From chair/3-in-1 Stand to Sit: 4: Min assist;With armrests;To chair/3-in-1 Details for Transfer Assistance: VCs for hand placement and to scoot to edge of chair with roacker technique  Ambulation/Gait Ambulation/Gait Assistance: 4: Min assist (+2 for lines) Ambulation Distance  (Feet): 12 Feet Assistive device: Rolling walker Ambulation/Gait Assistance Details: significantly limited by fatigue and work of breathing, desaturated to low 80s with very minimal ambulation on 5 liters Gait Pattern: Step-through pattern;Decreased stride length;Trunk flexed Gait velocity: slow General Gait Details: no episodes of LOB, VCs for position with rw Stairs: No    Exercises Total Joint Exercises Ankle Circles/Pumps: Both;10 reps Straight Leg Raises: Both;10 reps Long Arc Quad: AROM;Both;20 reps;Seated General Exercises - Upper Extremity Shoulder Flexion: AROM;Both;10 reps;Seated General Exercises - Lower Extremity Toe Raises: AROM;Both;10 reps;Standing Heel Raises: AROM;Both;20 reps   PT Diagnosis:    PT Problem List:   PT Treatment Interventions:     PT Goals Acute Rehab PT Goals PT Goal Formulation: With patient Time For Goal Achievement: 05/20/13 Potential to Achieve Goals: Good Pt will go Supine/Side to Sit: with modified independence;Other (comment) Pt will go Sit to Supine/Side: with modified independence;with HOB 0 degrees;with rail Pt will Ambulate: >150 feet;with least restrictive assistive device;with supervision PT Goal: Ambulate - Progress: Not progressing Pt will Go Up / Down Stairs: 3-5 stairs;with supervision;with least restrictive assistive device Pt will Perform Home Exercise Program: Independently PT Goal: Perform Home Exercise Program - Progress: Progressing toward goal  Visit Information  Last PT Received On: 05/18/13 Assistance Needed: +2    Subjective Data  Subjective: I get a little confused Patient Stated Goal: to feel like i can breathe better   Cognition  Cognition Arousal/Alertness: Awake/alert Behavior During Therapy: WFL for tasks assessed/performed Overall Cognitive Status: Impaired/Different from baseline Area of Impairment: Problem solving Problem Solving: Requires verbal cues;Slow processing General Comments: pt had  difficulty problem solving how to go about washing his face at the sink.  pt  did not unwrap the soap to start and when cued to do so he continuted to try to wash face with 1/2 the paper on the soap.  When finally instructed to do it how he does at home, he was able to complete the task.    Balance  Balance Balance Assessed: Yes Static Standing Balance Static Standing - Balance Support: Bilateral upper extremity supported Static Standing - Level of Assistance: 4: Min assist (multiple seated rest breaks needed while attempting standing)  End of Session PT - End of Session Equipment Utilized During Treatment: Gait belt;Oxygen (5L) Activity Tolerance: Patient limited by fatigue Patient left: in chair;with call bell/phone within reach;with family/visitor present Nurse Communication: Mobility status   GP     Fabio Asa 05/18/2013, 12:20 PM Charlotte Crumb, PT DPT  [phone]

## 2013-05-18 NOTE — Progress Notes (Signed)
PULMONARY  / CRITICAL CARE MEDICINE  Name: [name] [name] MRN: [id number] DOB: [birth year]    ADMISSION DATE:  05/04/2013 CONSULTATION DATE:  05/04/2013  REFERRING MD :  Verlon Au PRIMARY SERVICE:  Hospitalist  CHIEF COMPLAINT:  hemoptysis  BRIEF PATIENT DESCRIPTION: 77 yo WM with hx of severe COPD, CHF (LVEF 55% to 60% on 4/21), and atrial flutter with recent hospitalization from 4/21-4/30 for SBo treated conservatively. Was on Xarelto and presented with hemoptysis and pulmonary infiltrates initially attributed to HCAP.   SIGNIFICANT EVENTS / STUDIES:  5/5- admitted with hemoptysis  5/5 - 5/7 persistent hemoptysis 5/7 CT chest: dense LLL consolidation, no evidence of tumor or mass 5/8 early AM - transferred to SDU with increased SOB 5/8 No further hemoptysis 5/9 increased dyspnea and worsening hypoxemia 5/13 Increasing O2 reqt's. Increased infiltrates. Increased WBC count. Elective intubation for FOB 5/13 One unit PRBCs for tachycardia, hypotension, Hgb 7.1 5/13 FOB: normal airway exam. No evidence of active bleeding. Minimal mucoid secretions. Cytology >> NO malignant cells identified 5/13 Hypotension due to sedatives, mech vent - vasopressors initiated 5/14 Vasculitis serologies ordered: ESR 44, ANA neg, GBM Ab neg, ANCA neg 5/14 Passed SBT. Extubated to NRB, PRN BiPAP 5/15 One unit PRBCs for Hgb 6.5 5/16 Transfer to [place] / TUBES: ETT 5/13 >> 5/14 L IJ CVL 5/13 >>  R radial A line 5/13 >> 5/15  CULTURES: MRSA PCR 5/5 >> NEG Blood 5/5 >> NEG Resp 5/6 >> mod candida Strep Ag 5/6 >> NEG Legionella 5/6 >> NEG Resp 5/10 >> Mod Candida BAL bacterial cx 5/13 >> NEG BAL Pneumocystis >> NEG Urine 5/14 >> NEG BAL Fungal 5/13 >> smear negative >>  BAL Legionella >> smear negative >>  BAL AFB 5/13 >>  Blood 5/14 >>  FOB cytology 5/13>>>NEG  ANTIBIOTICS: Cefepime 5/5 >>>5/5 Vanco 5/5 >> 5/9 Zoysn 5/5 >> 5/12 cipro 5/9 >> 5/12 Levofloxacin 5/12 >> 5/18 Fluconazole  5/12 >> 5/20 (stop date ordered)   SUBJECTIVE:  Cont to improve. One episode old blood 5/18  VITAL SIGNS: Temp:  [97.6 F (36.4 C)-98.1 F (36.7 C)] 97.6 F (36.4 C) (05/19 0359) Pulse Rate:  [85-94] 88 (05/19 0359) Resp:  [19-21] 19 (05/19 0359) BP: (113-134)/(54-67) 124/54 mmHg (05/19 0359) SpO2:  [93 %-99 %] 95 % (05/19 0804) Weight:  [169 lb 11.2 oz (76.975 kg)] 169 lb 11.2 oz (76.975 kg) (05/19 0359)   PHYSICAL EXAMINATION: General:  NAD Neuro:  No focal deficits HEENT:  WNL Cardiovascular: IRIR s M Lungs: diminished BS in BLL, bibasilar crackles, exp wheezes Abdomen:  Soft, nontender, NABS Ext: No clubbing, no edema    Recent Labs Lab 05/16/13 1438 05/17/13 0418 05/18/13 0440  NA 140 141 140  K 5.1 4.3 4.0  CL 95* 98 96  CO2 34* 37* 37*  BUN 65* 55* 52*  CREATININE 1.67* 1.26 1.14  GLUCOSE 212* 212* 209*    Recent Labs Lab 05/16/13 0420 05/17/13 0418 05/18/13 0440  HGB 8.1* 7.8* 8.3*  HCT 25.1* 24.6* 26.0*  WBC 9.4 7.2 9.6  PLT 176 154 150   Dg Chest Port 1 View  05/18/2013   *RADIOLOGY REPORT*  Clinical Data: Pulmonary infiltrates.  PORTABLE CHEST - 1 VIEW  Comparison: 05/16/2013.  Findings: Persistent bilateral lower lobe infiltrates.  No definite pleural effusion or pneumothorax.  IMPRESSION: Persistent bilateral infiltrates.   Original Report Authenticated By: Marijo Sanes, M.D.    ASSESSMENT / PLAN:  PULMONARY A:  Acute on chronic respiratory  failure Hemoptysis, mild recurrence 5/16- neg bronch w/o bleeding site found Severe COPD Pulmonary infiltrates of unclear etiology- bilat L>R opacities - improving 5/19 Mediastinal LAN - likely reactive P: Taper steroids on 5/19 >> 30mg  Cont pulm hygiene Cont diuresis as SCr tol  Wean O2 as tolerated for SpO2 > 90% Cont BDs as scheduled F/u CXR  Cont monitor off abx     CARDIOLOGY A: A fib/flutter, better rate control s/p bioprosthetic Aortic Valve replacement with MAZE (Aug 2011, normal  coronaries on cath) Elevated BNP P: [place] cards following  Cont aggressive diuresis as tol per cards Cont cardizem, asa, PRN metoprolol  Cont to hold anticoagulation for now due to recurrent hemoptysis, will need to decide ultimately outpt whether to reatstart Avoiding amiodarone while on high flow O2 >> probably restart soon, currently on 4L/min   RENAL A:  Acute on chronic renal insuff Active urinary sediment P: Cont lasix BID  Monitor BMET Vasculitis serologies negative   INFECTIOUS DISEASE A: Suspected L > R LL PNA on presentation - little current evidence of active infection Leukocytosis, resolved Candida on 2 resp cultures P: Off abx  Monitor fevers, wbc trend   GI A:  Constipation Recent SBO treated conservatively P: LOC   Advance nutrition as tolerated   HEMATOLOGY A:  Anemia of acute illness - s/p PRBC x 1 this admit. Hgb trending up 5/19.  Thrombocytopenia. improved P: F/u cbc  transfuse for Hgb < 7.0 or for acute bleeding/symtpomatic anemia   ENDOCRINE A:  DM2 - note A1c was 5.8 in April. Hyperglycemia - anticipate worse control on systemic steroids  P: Increase lantus  Cont SSI    WHITEHEART,KATHRYN, NP 05/18/2013  10:52 AM Pager: [phone] or [phone]  *Care during the described time interval was provided by me and/or other providers on the critical care team. I have reviewed this patient's available data, including medical history, events of note, physical examination and test results as part of my evaluation.  Baltazar Apo, MD, PhD 05/18/2013, 11:27 AM [facility] [phone] or if no answer [phone]

## 2013-05-18 NOTE — Progress Notes (Signed)
Occupational Therapy Treatment Patient Details Name: [name] [name] MRN: [id number] DOB: [birth year] Today's Date: 05/18/2013 Time: 1914-7829 OT Time Calculation (min): 33 min  OT Assessment / Plan / Recommendation Comments on Treatment Session Pt mostly limited by O2 sat levels being low.  Pt did show some cognitive difficulties during treatment.  Unsure if this was related to low O2 or possible just being in and out of hospital so much in last month.  At baseline, pt does not have deficits.    Follow Up Recommendations  Home health OT;Supervision/Assistance - 24 hour    Barriers to Discharge       Equipment Recommendations  3 in 1 bedside comode;Tub/shower seat    Recommendations for Other Services    Frequency Min 2X/week   Plan Discharge plan remains appropriate    Precautions / Restrictions Precautions Precautions: Fall Restrictions Weight Bearing Restrictions: No   Pertinent Vitals/Pain Pt w no c/o pain.  O2 sats down to 80 with minimal activity on 5L O2.  Recovered to 93 w pursed lip breathing.    ADL  Grooming: Performed;Wash/dry face;Min guard Where Assessed - Grooming: Supported standing Toilet Transfer: Performed;Min Pension scheme manager Method: Sit to Barista: Raised toilet seat with arms (or 3-in-1 over toilet) Toileting - Clothing Manipulation and Hygiene: Min guard Where Assessed - Toileting Clothing Manipulation and Hygiene: Sit to stand from 3-in-1 or toilet Equipment Used: Other (comment) (O2) Transfers/Ambulation Related to ADLs: Pt walked in room to sink and back but stats dropped to 80% with the slightest activity. ADL Comments: Pt able to wash face with VCs for minor confusion.  Pt stated washing face with paper on the soap.  When cued to check the soap he did recognize the problem but continued to have some problem solving deficits during session.      OT Diagnosis:    OT Problem List:   OT Treatment Interventions:     OT  Goals Acute Rehab OT Goals OT Goal Formulation: With patient/family Time For Goal Achievement: 05/20/13 Potential to Achieve Goals: Good ADL Goals Pt Will Perform Grooming: with modified independence;Sitting at sink;Standing at sink;Other (comment) ADL Goal: Grooming - Progress: Progressing toward goals Pt Will Perform Upper Body Bathing: with modified independence;Sit to stand from chair;Sitting at sink Pt Will Perform Lower Body Bathing: with min assist;Sit to stand from chair;Sit to stand from bed Pt Will Perform Upper Body Dressing: with modified independence;Sit to stand from chair Pt Will Perform Lower Body Dressing: with min assist;Sit to stand from chair;Sit to stand from bed Pt Will Transfer to Toilet: with modified independence;Ambulation;Comfort height toilet ADL Goal: Toilet Transfer - Progress: Progressing toward goals Additional ADL Goal #1: Pt will participate in 20 mins therapeutic activity with no more than 2 rests and dyspnea 2/4 ADL Goal: Additional Goal #1 - Progress: Progressing toward goals Miscellaneous OT Goals Miscellaneous OT Goal #1: Pt will demonstrate basic transfer with oxygen saturations >90 % to decr fall risk with adls OT Goal: Miscellaneous Goal #1 - Progress: Not progressing  Visit Information  Last OT Received On: 05/18/13 Assistance Needed: +2 PT/OT Co-Evaluation/Treatment: Yes    Subjective Data      Prior Functioning       Cognition  Cognition Arousal/Alertness: Awake/alert Behavior During Therapy: WFL for tasks assessed/performed Overall Cognitive Status: Impaired/Different from baseline Area of Impairment: Problem solving Problem Solving: Requires verbal cues;Slow processing General Comments: pt had difficulty problem solving how to go about washing his face at the sink.  pt did not unwrap the soap to start and when cued to do so he continuted to try to wash face with 1/2 the paper on the soap.  When finally instructed to do it how he  does at home, he was able to complete the task.    Mobility  Transfers Transfers: Sit to Stand;Stand to Sit Sit to Stand: 3: Mod assist;With upper extremity assist;With armrests;From chair/3-in-1 Stand to Sit: 4: Min assist;With armrests;To bed Details for Transfer Assistance: cues to scoot to side of bed and cues for hand placement.    Exercises  General Exercises - Upper Extremity Shoulder Flexion: AROM;Both;10 reps;Seated   Balance Balance Balance Assessed: Yes Static Standing Balance Static Standing - Balance Support: Bilateral upper extremity supported Static Standing - Level of Assistance: 4: Min assist   End of Session OT - End of Session Activity Tolerance: Patient limited by fatigue;Treatment limited secondary to medical complications (Comment) Patient left: in chair;with call bell/phone within reach Nurse Communication: Mobility status;Other (comment) (stats as low as 80% w 5L O2.)  GO     Hope Budds 05/18/2013, 11:03 AM [phone]

## 2013-05-19 ENCOUNTER — Inpatient Hospital Stay (HOSPITAL_COMMUNITY): Payer: Medicare Other

## 2013-05-19 LAB — CBC
HCT: 25.3 % — ABNORMAL LOW (ref 39.0–52.0)
Hemoglobin: 8.2 g/dL — ABNORMAL LOW (ref 13.0–17.0)
MCH: 30.3 pg (ref 26.0–34.0)
MCHC: 32.4 g/dL (ref 30.0–36.0)
RDW: 18.3 % — ABNORMAL HIGH (ref 11.5–15.5)

## 2013-05-19 LAB — BASIC METABOLIC PANEL
BUN: 47 mg/dL — ABNORMAL HIGH (ref 6–23)
Calcium: 7.5 mg/dL — ABNORMAL LOW (ref 8.4–10.5)
Creatinine, Ser: 1.14 mg/dL (ref 0.50–1.35)
GFR calc Af Amer: 71 mL/min — ABNORMAL LOW (ref >90)
GFR calc non Af Amer: 61 mL/min — ABNORMAL LOW (ref >90)
Glucose, Bld: 176 mg/dL — ABNORMAL HIGH (ref 70–99)
Potassium: 3.6 mEq/L (ref 3.5–5.1)

## 2013-05-19 LAB — CULTURE, BLOOD (ROUTINE X 2): Culture: NO GROWTH

## 2013-05-19 LAB — GLUCOSE, CAPILLARY
Glucose-Capillary: 321 mg/dL — ABNORMAL HIGH (ref 70–99)
Glucose-Capillary: 305 mg/dL — ABNORMAL HIGH (ref 70–99)

## 2013-05-19 NOTE — Progress Notes (Signed)
PULMONARY  / CRITICAL CARE MEDICINE  Name: [name] [name] MRN: [id number] DOB: 12/15/1936    ADMISSION DATE:  05/04/2013 CONSULTATION DATE:  05/04/2013  REFERRING MD :  Verlon Au PRIMARY SERVICE:  Hospitalist  CHIEF COMPLAINT:  hemoptysis  BRIEF PATIENT DESCRIPTION: 77 yo WM with hx of severe COPD, CHF (LVEF 55% to 60% on 4/21), and atrial flutter with recent hospitalization from 4/21-4/30 for SBo treated conservatively. Was on Xarelto and presented with hemoptysis and pulmonary infiltrates initially attributed to HCAP.   SIGNIFICANT EVENTS / STUDIES:  5/05 - admitted with hemoptysis  5/05 - 5/7 persistent hemoptysis 5/07- CT chest: dense LLL consolidation, no evidence of tumor or mass 5/08 - early AM - transferred to SDU with increased SOB 5/08 - No further hemoptysis 5/09 - increased dyspnea and worsening hypoxemia 5/13  -Increasing O2 reqt's. Increased infiltrates. Increased WBC count. Elective intubation for FOB 5/13 - One unit PRBCs for tachycardia, hypotension, Hgb 7.1 5/13 - FOB: normal airway exam. No evidence of active bleeding. Minimal mucoid secretions. Cytology >> NO malignant cells identified 5/13 - Hypotension due to sedatives, mech vent - vasopressors initiated 5/14 - Vasculitis serologies ordered: ESR 44, ANA neg, GBM Ab neg, ANCA neg 5/14 - Passed SBT. Extubated to NRB, PRN BiPAP 5/15 - One unit PRBCs for Hgb 6.5 5/16 - Transfer to Tele 5/20 - Daughter agreeable for d/c to SNF Rehab at [facility], patient agrees and feels he needs rehab efforts   LINES / TUBES: ETT 5/13 >> 5/14 L IJ CVL 5/13 >>  R radial A line 5/13 >> 5/15  CULTURES: MRSA PCR 5/5 >> NEG Blood 5/5 >> NEG Resp 5/6 >> mod candida Strep Ag 5/6 >> NEG Legionella 5/6 >> NEG Resp 5/10 >> Mod Candida BAL bacterial cx 5/13 >> NEG BAL Pneumocystis >> NEG Urine 5/14 >> NEG BAL Fungal 5/13 >> smear negative >>  BAL Legionella 513>>neg BAL AFB 5/13 >>neg on prelim>>> Blood 5/14 >>neg  FOB cytology  5/13>>>NEG  ANTIBIOTICS: Cefepime 5/5 >>>5/5 Vanco 5/5 >> 5/9 Zoysn 5/5 >> 5/12 cipro 5/9 >> 5/12 Levofloxacin 5/12 >> 5/18 Fluconazole 5/12 >> 5/20 (stop date ordered)   SUBJECTIVE:  Feels tight / SOB today.  Continues to cough up streaky bloody sputum (usually rusty / dark).  Walked 12 ft with PT and had to rest, then another 18 feet, noted desaturations with ambulation  VITAL SIGNS: Temp:  [97.8 F (36.6 C)-98 F (36.7 C)] 97.9 F (36.6 C) (05/20 0501) Pulse Rate:  [78-97] 88 (05/20 0501) Resp:  [18-19] 18 (05/20 0501) BP: (113-134)/(58-73) 113/62 mmHg (05/20 0501) SpO2:  [90 %-96 %] 94 % (05/20 0847) Weight:  [171 lb 11.8 oz (77.9 kg)] 171 lb 11.8 oz (77.9 kg) (05/20 0501)   PHYSICAL EXAMINATION: General:  NAD Neuro:  No focal deficits HEENT:  WNL Cardiovascular: IRIR s M Lungs: diminished BS L>R Abdomen:  Soft, nontender, NABS Ext: No clubbing, no edema    Recent Labs Lab 05/17/13 0418 05/18/13 0440 05/19/13 0400  NA 141 140 140  K 4.3 4.0 3.6  CL 98 96 97  CO2 37* 37* 39*  BUN 55* 52* 47*  CREATININE 1.26 1.14 1.14  GLUCOSE 212* 209* 176*    Recent Labs Lab 05/17/13 0418 05/18/13 0440 05/19/13 0400  HGB 7.8* 8.3* 8.2*  HCT 24.6* 26.0* 25.3*  WBC 7.2 9.6 12.2*  PLT 154 150 148*   Dg Chest Portable 1 View  05/19/2013   *RADIOLOGY REPORT*  Clinical Data: Follow up infiltrates.  PORTABLE CHEST - 1 VIEW  Comparison: 05/18/2013.  Findings: Stable cardiac enlargement.  Bilateral interstitial and airspace process not significantly changed.  It could reflect asymmetric edema but is more likely bilateral infiltrates. Probable small left effusion.  IMPRESSION: Persistent bilateral infiltrates.   Original Report Authenticated By: Marijo Sanes, M.D.   Dg Chest Port 1 View  05/18/2013   *RADIOLOGY REPORT*  Clinical Data: Pulmonary infiltrates.  PORTABLE CHEST - 1 VIEW  Comparison: 05/16/2013.  Findings: Persistent bilateral lower lobe infiltrates.  No definite  pleural effusion or pneumothorax.  IMPRESSION: Persistent bilateral infiltrates.   Original Report Authenticated By: Marijo Sanes, M.D.    ASSESSMENT / PLAN:  PULMONARY A:  Acute on chronic respiratory failure Hemoptysis - mild recurrence 5/16- neg bronch w/o bleeding site found Severe COPD Pulmonary infiltrates of unclear etiology - bilat L>R opacities - improving 5/19 Mediastinal LAN - likely reactive P: Taper steroids on 5/19 >> 30mg  Cont pulm hygiene Cont diuresis as SCr tol  Wean O2 as tolerated for SpO2 > 90% Cont BDs as scheduled F/u CXR  Cont monitor off abx     CARDIOLOGY A: A fib/flutter, better rate control s/p bioprosthetic Aortic Valve replacement with MAZE (Aug 2011, normal coronaries on cath) Elevated BNP P: [place] cards following  Cont aggressive diuresis as tol per Cardiology.  Will need outpatient diuresis plan from Cardiology. Cont cardizem, asa, PRN metoprolol  Cont to hold anticoagulation for now due to recurrent hemoptysis, will need to decide ultimately outpt whether to reatstart Avoiding amiodarone while on high flow O2 >> probably restart soon, currently on 4L/min   RENAL A:  Acute on chronic renal insuff Active urinary sediment P: Cont lasix BID  Monitor BMET Vasculitis serologies negative   INFECTIOUS DISEASE A: Suspected L > R LL PNA on presentation - little current evidence of active infection Leukocytosis, resolved Candida on 2 resp cultures P: Off abx / antifungals  Monitor fevers, wbc trend   GI A:  Constipation Recent SBO treated conservatively P: LOC   Advance nutrition as tolerated   HEMATOLOGY A:  Anemia of acute illness - s/p PRBC x 1 this admit. Hgb trending up 5/19.  Thrombocytopenia -  improved P: F/u cbc  transfuse for Hgb < 7.0 or for acute bleeding/symtpomatic anemia   ENDOCRINE A:  DM2 - note A1c was 5.8 in April. Hyperglycemia - anticipate worse control on systemic steroids  P: Continue lantus  @ 25, anticipate reduction with taper of steroids Cont SSI    DISPO Plan for d/c to SNF Rehab at [facility] on Wed or Thursday pending plan from Cardiology for diuresis, antiarrhythmics.  Daughter has been wavering on placement vs home.  Patient feels he needs short term rehab after PT efforts this am.     Noe Gens, NP-C [facility] Pgr: [phone] or [phone]    *Care during the described time interval was provided by me and/or other providers on the critical care team. I have reviewed this patient's available data, including medical history, events of note, physical examination and test results as part of my evaluation.   Baltazar Apo, MD, PhD 05/19/2013, 6:24 PM [facility] [phone] or if no answer [phone]

## 2013-05-19 NOTE — Progress Notes (Signed)
Inpatient Diabetes Program Recommendations  AACE/ADA: New Consensus Statement on Inpatient Glycemic Control (2013)  Target Ranges:  Prepandial:   less than 140 mg/dL      Peak postprandial:   less than 180 mg/dL (1-2 hours)      Critically ill patients:  140 - 180 mg/dL   Results for JAROLD, MACOMBER (MRN [id number]) as of 05/19/2013 10:28  Ref. Range 05/18/2013 06:03 05/18/2013 11:48 05/18/2013 16:30 05/18/2013 21:15 05/19/2013 06:11  Glucose-Capillary Latest Range: 70-99 mg/dL 409 (H) 811 (H) 914 (H) 261 (H) 141 (H)   Inpatient Diabetes Program Recommendations Correction (SSI): Please consider increasing Novolog correction scale to resistant scale.  Note: Note that Lantus was increased to 25 units daily on 5/19 and will receive first dose of Lantus 25 units today.  Blood glucose has ranged from 141-301 mg/dl over the past 24 hours.  Please consider increasing Novolog to resistant correction scale to improve glycemic control.    Thanks, Orlando Penner, RN, MSN, CCRN Diabetes Coordinator Inpatient Diabetes Program [phone]

## 2013-05-19 NOTE — Progress Notes (Signed)
Physical Therapy Treatment Patient Details Name: [name] [name] MRN: [id number] DOB: 1936/01/16 Today's Date: 05/19/2013 Time: 4098-1191 PT Time Calculation (min): 28 min  PT Assessment / Plan / Recommendation Comments on Treatment Session  Pt very limited with activities secondary to poor SpO2 levels. Patient desaturated with ambulating to 81% on 4 liters, rebounded to 94% with extended seated rest break. Willl continue to see and progress activity as tolerated. Spoke at length with pt and daughter re: d/c plan.  They want to get therapy at home.  Explained the length of time that [facility] therapy and aide provide and the benefit of NHP.  Pt and daughter adamant they want to go home.  Pt will need RW and 3N1 as well as gait belt and pulse oximeter for home use.  Also will need HHPT, HHOT, HHAide and HHRN.  Ultimately feel that NHP would benefit pt but family refusing when PT in room.      Follow Up Recommendations  Home health PT;Supervision/Assistance - 24 hour;Other (comment) (family wants HHPT/OT/aide/RN  though PT recommending SNF)                 Equipment Recommendations  Rolling walker with 5" wheels;Other (comment) (3N1, gait belt and pulse oximeter)        Frequency Min 3X/week   Plan Discharge plan remains appropriate;Frequency remains appropriate    Precautions / Restrictions Precautions Precautions: Fall Restrictions Weight Bearing Restrictions: No   Pertinent Vitals/Pain Desat to 81% on 4LO2 with activity.  Takes minutes to recover to >90%; no pain    Mobility  Bed Mobility Bed Mobility: Not assessed Transfers Transfers: Sit to Stand;Stand to Sit;Stand Pivot Transfers Sit to Stand: 3: Mod assist;With upper extremity assist;With armrests;From chair/3-in-1 Stand to Sit: 4: Min assist;With armrests;To chair/3-in-1 Details for Transfer Assistance: VCs for hand placement and to scoot to edge of chair with roacker technique  Ambulation/Gait Ambulation/Gait Assistance: 1: +2  Total assist Ambulation/Gait: Patient Percentage: 70% Ambulation Distance (Feet): 30 Feet (12 feet and then 18 feet) Assistive device: Rolling walker Ambulation/Gait Assistance Details: Pt ambulated with Rw with desat to 83% during gait on 4LO2.  O2 at rest >90% on 4LO2.  Did not remove O2 secondary to low sats in general today.   Gait Pattern: Step-through pattern;Decreased stride length;Trunk flexed Gait velocity: slow General Gait Details: no episodes of LOB, VCs for position with rw Stairs: No Wheelchair Mobility Wheelchair Mobility: No    PT Goals Acute Rehab PT Goals Pt will Ambulate: >150 feet;with least restrictive assistive device;with supervision PT Goal: Ambulate - Progress: Progressing toward goal  Visit Information  Last PT Received On: 05/19/13 Assistance Needed: +2    Subjective Data  Subjective: "I just give out."   Cognition  Cognition Arousal/Alertness: Awake/alert Behavior During Therapy: WFL for tasks assessed/performed Overall Cognitive Status: Impaired/Different from baseline Area of Impairment: Problem solving Problem Solving: Requires verbal cues;Slow processing    Balance  Static Standing Balance Static Standing - Balance Support: Bilateral upper extremity supported Static Standing - Level of Assistance: 4: Min assist  End of Session PT - End of Session Equipment Utilized During Treatment: Gait belt;Oxygen (4L) Activity Tolerance: Patient limited by fatigue Patient left: in chair;with call bell/phone within reach;with family/visitor present Nurse Communication: Mobility status       [name] [name] 05/19/2013, 12:17 PM [facility] Rehabilitation [phone] (pager)

## 2013-05-19 NOTE — Progress Notes (Signed)
SUBJECTIVE: "I've had a rough morning."  More SOB this am.     PHYSICAL EXAM Filed Vitals:   05/18/13 2035 05/19/13 0226 05/19/13 0501 05/19/13 0847  BP: 134/58  113/62   Pulse: 78  88   Temp: 98 F (36.7 C)  97.9 F (36.6 C)   TempSrc: Oral  Oral   Resp: 18  18   Height:      Weight:   171 lb 11.8 oz (77.9 kg)   SpO2: 96% 90% 94% 94%   General:  No distress Lungs:  Bilateral decreased breath sounds with few wheezes unchanged Heart:  Irregular Abdomen:  Positive bowel sounds, no rebound no guarding Extremities:  Mild edema  LABS:  Results for orders placed during the hospital encounter of 05/04/13 (from the past 24 hour(s))  GLUCOSE, CAPILLARY     Status: Abnormal   Collection Time    05/18/13 11:48 AM      Result Value Range   Glucose-Capillary 301 (*) 70 - 99 mg/dL   Comment 1 Notify RN     Comment 2 Documented in Chart    GLUCOSE, CAPILLARY     Status: Abnormal   Collection Time    05/18/13  4:30 PM      Result Value Range   Glucose-Capillary 220 (*) 70 - 99 mg/dL   Comment 1 Notify RN     Comment 2 Documented in Chart    GLUCOSE, CAPILLARY     Status: Abnormal   Collection Time    05/18/13  9:15 PM      Result Value Range   Glucose-Capillary 261 (*) 70 - 99 mg/dL  CBC     Status: Abnormal   Collection Time    05/19/13  4:00 AM      Result Value Range   WBC 12.2 (*) 4.0 - 10.5 K/uL   RBC 2.71 (*) 4.22 - 5.81 MIL/uL   Hemoglobin 8.2 (*) 13.0 - 17.0 g/dL   HCT 30.8 (*) 65.7 - 84.6 %   MCV 93.4  78.0 - 100.0 fL   MCH 30.3  26.0 - 34.0 pg   MCHC 32.4  30.0 - 36.0 g/dL   RDW 96.2 (*) 95.2 - 84.1 %   Platelets 148 (*) 150 - 400 K/uL  BASIC METABOLIC PANEL     Status: Abnormal   Collection Time    05/19/13  4:00 AM      Result Value Range   Sodium 140  135 - 145 mEq/L   Potassium 3.6  3.5 - 5.1 mEq/L   Chloride 97  96 - 112 mEq/L   CO2 39 (*) 19 - 32 mEq/L   Glucose, Bld 176 (*) 70 - 99 mg/dL   BUN 47 (*) 6 - 23 mg/dL   Creatinine, Ser 3.24  0.50 -  1.35 mg/dL   Calcium 7.5 (*) 8.4 - 10.5 mg/dL   GFR calc non Af Amer 61 (*) >90 mL/min   GFR calc Af Amer 71 (*) >90 mL/min  GLUCOSE, CAPILLARY     Status: Abnormal   Collection Time    05/19/13  6:11 AM      Result Value Range   Glucose-Capillary 141 (*) 70 - 99 mg/dL    Intake/Output Summary (Last 24 hours) at 05/19/13 0910 Last data filed at 05/19/13 0730  Gross per 24 hour  Intake    943 ml  Output   3250 ml  Net  -2307 ml    ASSESSMENT AND PLAN:  Atrial fibrillation:  No anticoagulation with hemoptysis.  Tele reviewed.  Rate is up slightly.  Current medications.    Acute on chronic diastolic HF:  Excellent urine output again yesterday with stable creatinine.  Continue Lasix IV BID today again today and follow BMET in the am.  Probably switch to PO Lasix in the AM.     Rollene Rotunda 05/19/2013 9:10 AM

## 2013-05-19 NOTE — Clinical Social Work Placement (Addendum)
    Clinical Social Work Department CLINICAL SOCIAL WORK PLACEMENT NOTE 05/19/2013  Patient:  [name] [name], [name] [name]  Account Number:  [id number] Admit date:  05/04/2013  Clinical Social Worker:  Hulan Fray  Date/time:  05/19/2013 01:53 PM  Clinical Social Work is seeking post-discharge placement for this patient at the following level of care:   SKILLED NURSING   (*CSW will update this form in Epic as items are completed)   05/19/2013  Patient/family provided with [facility] Department of Clinical Social Work's list of facilities offering this level of care within the geographic area requested by the patient (or if unable, by the patient's family).  05/19/2013  Patient/family informed of their freedom to choose among providers that offer the needed level of care, that participate in Medicare, Medicaid or managed care program needed by the patient, have an available bed and are willing to accept the patient.  05/19/2013  Patient/family informed of [facility]' ownership interest in [facility], as well as of the fact that they are under no obligation to receive care at this facility.  PASARR submitted to [employer] on 09/13/2010 PASARR number received from [employer] on 09/13/2010  FL2 transmitted to all facilities in geographic area requested by pt/family on  05/19/2013 FL2 transmitted to all facilities within larger geographic area on   Patient informed that his/her managed care company has contracts with or will negotiate with  certain facilities, including the following:     Patient/family informed of bed offers received:  05-20-13 Patient chooses bed at [facility] Physician recommends and patient chooses bed at    Patient to be transferred to [facility] on 05-26-13   Patient to be transferred to facility by [facility]  The following physician request were entered in Epic:   Additional Comments:  05-19-13 [facility] clinicals were submitted  for prior approval for SNF placement.

## 2013-05-20 ENCOUNTER — Inpatient Hospital Stay (HOSPITAL_COMMUNITY): Payer: Medicare Other

## 2013-05-20 LAB — BASIC METABOLIC PANEL
BUN: 41 mg/dL — ABNORMAL HIGH (ref 6–23)
Chloride: 93 mEq/L — ABNORMAL LOW (ref 96–112)
GFR calc Af Amer: >90 mL/min (ref >90)
GFR calc non Af Amer: 79 mL/min — ABNORMAL LOW (ref >90)
Potassium: 3.6 mEq/L (ref 3.5–5.1)
Sodium: 139 mEq/L (ref 135–145)

## 2013-05-20 LAB — GLUCOSE, CAPILLARY: Glucose-Capillary: 274 mg/dL — ABNORMAL HIGH (ref 70–99)

## 2013-05-20 LAB — CBC
HCT: 28.6 % — ABNORMAL LOW (ref 39.0–52.0)
Hemoglobin: 9.1 g/dL — ABNORMAL LOW (ref 13.0–17.0)
MCHC: 31.8 g/dL (ref 30.0–36.0)
WBC: 20.8 10*3/uL — ABNORMAL HIGH (ref 4.0–10.5)

## 2013-05-20 NOTE — Progress Notes (Signed)
Patient: DOCTOR SHEAHAN Date of Encounter: 05/20/2013, 8:52 AM Admit date: 05/04/2013     Subjective  Mr. Hardgrove reports his SOB is persistent without much improvement. Still cannot lie flat. He reports chest "tightness" with coughing. He denies palpitations.   Objective  Physical Exam: Vitals: BP 121/61  Pulse 85  Temp(Src) 98.5 F (36.9 C) (Oral)  Resp 18  Ht 5\' 6"  (1.676 m)  Wt 165 lb 9.6 oz (75.116 kg)  BMI 26.74 kg/m2  SpO2 3% General: Well developed, chronically ill appearing 77 year old male in no acute distress. Neck: Supple. JVD not elevated. Lungs: Coarse breath sounds bilaterally. Breathing is unlabored. Heart: Irregularly irregular S1 S2 without murmurs, rubs, or gallops.  Abdomen: Soft, non-distended. Extremities: No clubbing or cyanosis. Trace pedal edema bilaterally.   Neuro: Alert and oriented X 3. Moves all extremities spontaneously. No focal deficits.  Intake/Output:  Intake/Output Summary (Last 24 hours) at 05/20/13 0852 Last data filed at 05/20/13 0730  Gross per 24 hour  Intake   1080 ml  Output   1975 ml  Net   -895 ml    Inpatient Medications:  . antiseptic oral rinse  15 mL Mouth Rinse BID  . aspirin  81 mg Oral Daily  . diltiazem  240 mg Oral Daily  . enoxaparin (LOVENOX) injection  30 mg Subcutaneous Q24H  . fluticasone  1 spray Each Nare Daily  . furosemide  40 mg Intravenous BID  . glyBURIDE  2.5 mg Oral BID WC  . insulin aspart  0-15 Units Subcutaneous TID WC  . insulin aspart  0-5 Units Subcutaneous QHS  . insulin aspart  3 Units Subcutaneous TID WC  . insulin glargine  25 Units Subcutaneous Daily  . ipratropium  0.5 mg Nebulization Q6H  . levalbuterol  0.63 mg Nebulization Q6H  . predniSONE  30 mg Oral Q breakfast  . sodium chloride  3 mL Intravenous Q12H   . sodium chloride Stopped (05/14/13 1600)    Labs:  Recent Labs  05/19/13 0400 05/20/13 0535  NA 140 139  K 3.6 3.6  CL 97 93*  CO2 39* 37*  GLUCOSE 176* 122*   BUN 47* 41*  CREATININE 1.14 0.97  CALCIUM 7.5* 8.3*    Recent Labs  05/18/13 0440 05/19/13 0400 05/20/13 0535  WBC 9.6 12.2* 20.8*  NEUTROABS 8.7*  --   --   HGB 8.3* 8.2* 9.1*  HCT 26.0* 25.3* 28.6*  MCV 93.5 93.4 95.0  PLT 150 148* 154    Radiology/Studies: Dg Chest Portable 1 View  05/19/2013   Clinical Data: Follow up infiltrates.  Comparison: 05/18/2013.  Findings: Stable cardiac enlargement.  Bilateral interstitial and airspace process not significantly changed.  It could reflect asymmetric edema but is more likely bilateral infiltrates. Probable small left effusion.  IMPRESSION: Persistent bilateral infiltrates.   Original Report Authenticated By: Rudie Meyer, M.D.    Echocardiogram: 04/20/2013 Study Conclusions - Left ventricle: The cavity size was normal. Wall thickness was increased in a pattern of mild LVH. Systolic function was normal. The estimated ejection fraction was in the range of 55% to 60%. - Mitral valve: Calcified annulus. - Left atrium: The atrium was moderately dilated. - Right ventricle: The cavity size was mildly dilated. - Right atrium: The atrium was moderately dilated. - Atrial septum: No defect or patent foramen ovale was identified. - Pulmonary arteries: PA peak pressure: 47mm Hg (S). - Impressions: PA presssures may be underestimated by TR velocity. Morphologic signs  of pulmonary hypertension with mild flattenin gof septuma and RV dilatation   Telemetry: rate controlled AF/AFL    Assessment and Plan  1. Atrial fibrillation/flutter - rate controlled; asymptomatic; continue rate control with diltiazem; off Xarelto due to hemoptysis 2. Chronic diastolic HF - SOB is multifactorial but may need to continue IV Lasix another day or so; MD to advise 3. HCAP - per PCCM 4. Hemoptysis - now off Xarelto/anticoagulation; bronchoscopy negative 5. Severe COPD - per PCCM   Dr. Ladona Ridgel to see Signed, Exie Parody  EP  Attending  Patient seen and examined. Agree with above. He will need additional diuresis. Renal function actually better today. Weight is down over the past 24 hours.  Leonia Reeves.D.

## 2013-05-20 NOTE — Progress Notes (Signed)
Glucose elevated in 300's during the day. Fasting is good until pt eats a meal.  Please increase meal coverage to 6 units tidwc.Thank you, Lenor Coffin, RN, CNS, Diabetes Coordinator [phone])

## 2013-05-20 NOTE — Clinical Social Work Note (Addendum)
Clinical Social Worker received confirmation from [facility] SN[facility] and they will have availability tomorrow if patient is medically stable. CSW updated family on bed offer received from facility.  Rozetta Nunnery MSW, [employer] [phone]

## 2013-05-20 NOTE — Progress Notes (Signed)
PULMONARY  / CRITICAL CARE MEDICINE  Name: [name] [name] MRN: [id number] DOB: 1936/08/28    ADMISSION DATE:  05/04/2013 CONSULTATION DATE:  05/04/2013  REFERRING MD :  Verlon Au PRIMARY SERVICE:  Hospitalist  CHIEF COMPLAINT:  hemoptysis  BRIEF PATIENT DESCRIPTION: 77 yo WM with hx of severe COPD, CHF (LVEF 55% to 60% on 4/21), and atrial flutter with recent hospitalization from 4/21-4/30 for SBo treated conservatively. Was on Xarelto and presented with hemoptysis and pulmonary infiltrates initially attributed to HCAP.   SIGNIFICANT EVENTS / STUDIES:  5/05 - admitted with hemoptysis  5/05 - 5/7 persistent hemoptysis 5/07- CT chest: dense LLL consolidation, no evidence of tumor or mass 5/08 - early AM - transferred to SDU with increased SOB 5/08 - No further hemoptysis 5/09 - increased dyspnea and worsening hypoxemia 5/13  -Increasing O2 reqt's. Increased infiltrates. Increased WBC count. Elective intubation for FOB 5/13 - One unit PRBCs for tachycardia, hypotension, Hgb 7.1 5/13 - FOB: normal airway exam. No evidence of active bleeding. Minimal mucoid secretions. Cytology >> NO malignant cells identified 5/13 - Hypotension due to sedatives, mech vent - vasopressors initiated 5/14 - Vasculitis serologies ordered: ESR 44, ANA neg, GBM Ab neg, ANCA neg 5/14 - Passed SBT. Extubated to NRB, PRN BiPAP 5/15 - One unit PRBCs for Hgb 6.5 5/16 - Transfer to Tele 5/20 - Daughter agreeable for d/c to SNF Rehab at [facility], patient agrees and feels he needs rehab efforts   LINES / TUBES: ETT 5/13 >> 5/14 L IJ CVL 5/13 >> out R radial A line 5/13 >> 5/15  CULTURES: MRSA PCR 5/5 >> NEG Blood 5/5 >> NEG Resp 5/6 >> mod candida Strep Ag 5/6 >> NEG Legionella 5/6 >> NEG Resp 5/10 >> Mod Candida BAL bacterial cx 5/13 >> NEG BAL Pneumocystis >> NEG Urine 5/14 >> NEG BAL Fungal 5/13 >> smear negative >>  BAL Legionella 513>>neg BAL AFB 5/13 >>neg on prelim>>> Blood 5/14 >>neg  FOB cytology  5/13>>>NEG  ANTIBIOTICS: Cefepime 5/5 >>>5/5 Vanco 5/5 >> 5/9 Zoysn 5/5 >> 5/12 cipro 5/9 >> 5/12 Levofloxacin 5/12 >> 5/18 Fluconazole 5/12 >> 5/20 (stop date ordered)   SUBJECTIVE:  Feeling more SOB.  Cont to have hemoptysis, this am coughed up mod amount "burgundy" sputum.  Feels wheezy.  Ok with d/c to SNF for rehab.   VITAL SIGNS: Temp:  [97.8 F (36.6 C)-98.5 F (36.9 C)] 98.5 F (36.9 C) (05/21 0431) Pulse Rate:  [85-104] 85 (05/21 0431) Resp:  [18] 18 (05/21 0431) BP: (121-140)/(61-71) 121/61 mmHg (05/21 0431) SpO2:  [3 %-96 %] 3 % (05/21 0833) Weight:  [165 lb 9.6 oz (75.116 kg)] 165 lb 9.6 oz (75.116 kg) (05/21 0431)   PHYSICAL EXAMINATION: General:  NAD, OOB in chair  Neuro:  No focal deficits HEENT:  WNL Cardiovascular: IRIR s M Lungs: resps even non labored, L>R scattered ronchi, bibasilar crackled, exp wheeze.  Mod amount rust colored sputum, fills emesis basin.  Abdomen:  Soft, nontender, NABS Ext: No clubbing, 1+ pitting edema BLE    Recent Labs Lab 05/18/13 0440 05/19/13 0400 05/20/13 0535  NA 140 140 139  K 4.0 3.6 3.6  CL 96 97 93*  CO2 37* 39* 37*  BUN 52* 47* 41*  CREATININE 1.14 1.14 0.97  GLUCOSE 209* 176* 122*    Recent Labs Lab 05/18/13 0440 05/19/13 0400 05/20/13 0535  HGB 8.3* 8.2* 9.1*  HCT 26.0* 25.3* 28.6*  WBC 9.6 12.2* 20.8*  PLT 150 148* 154  Dg Chest Portable 1 View  05/19/2013   *RADIOLOGY REPORT*  Clinical Data: Follow up infiltrates.  PORTABLE CHEST - 1 VIEW  Comparison: 05/18/2013.  Findings: Stable cardiac enlargement.  Bilateral interstitial and airspace process not significantly changed.  It could reflect asymmetric edema but is more likely bilateral infiltrates. Probable small left effusion.  IMPRESSION: Persistent bilateral infiltrates.   Original Report Authenticated By: Marijo Sanes, M.D.    ASSESSMENT / PLAN:  PULMONARY A:  Acute on chronic respiratory failure Hemoptysis - mild recurrence 5/16- neg  bronch w/o bleeding site found Severe COPD - increased bronchospasm 5/21 Pulmonary infiltrates of unclear etiology - bilat L>R opacities - improving 5/19 Mediastinal LAN - likely reactive P: Cont steroids and taper -- hold on taper 5/21 with cont bronchospasm  Cont pulm hygiene Cont diuresis as SCr tol  Wean O2 as tolerated for SpO2 > 90% Cont BDs as scheduled F/u CXR today with increased SOB  Cont monitor off abx  ?? Consider repeat FOB with cont hemoptysis despite holding anticoagulation   CARDIOLOGY A: A fib/flutter, better rate control s/p bioprosthetic Aortic Valve replacement with MAZE (Aug 2011, normal coronaries on cath) Elevated BNP P: [place] cards following  Cont aggressive diuresis as tol per Cardiology - likely need to cont IV lasix another day or so with increased BLE edema and SOB.  Will need outpatient diuresis plan from Cardiology once euvolemic, appreciate Dr Tanna Furry help Cont cardizem, asa, PRN metoprolol  Cont to hold anticoagulation for now due to recurrent hemoptysis, will need to decide ultimately outpt whether to reatstart Ok from pulm standpoint to restart amiodarone if it is felt he needs to go home on this (may not be necessary)  RENAL A:  Acute on chronic renal insuff, improved 5/21 Active urinary sediment P: Cont lasix IV BID  Monitor BMET Vasculitis serologies negative  INFECTIOUS DISEASE A: Suspected L > R LL PNA on presentation - little current evidence of active infection Leukocytosis, resolved Candida on 2 resp cultures P: Off abx / antifungals  Monitor fevers, wbc trend  GI A:  Constipation Recent SBO treated conservatively P: LOC   Advance nutrition as tolerated   HEMATOLOGY A:  Anemia of acute illness - s/p PRBC x 1 this admit. Hgb trending up 5/19.  Thrombocytopenia -  improved P: F/u cbc  transfuse for Hgb < 7.0 or for acute bleeding/symtpomatic anemia   ENDOCRINE A:  DM2 - note A1c was 5.8 in  April. Hyperglycemia - anticipate worse control on systemic steroids  P: Continue lantus @ 25, anticipate reduction with taper of steroids Cont SSI    DISPO Plan for d/c to SNF Rehab at [facility] pending plan from Cardiology for diuresis, antiarrhythmics.  Family now on board with SNF at d/c.  Anticipate d/c in next day or so.   Darlina Sicilian, NP 05/20/2013  9:55 AM Pager: [phone] or [phone]  *Care during the described time interval was provided by me and/or other providers on the critical care team. I have reviewed this patient's available data, including medical history, events of note, physical examination and test results as part of my evaluation.  Baltazar Apo, MD, PhD 05/20/2013, 1:55 PM [facility] [phone] or if no answer [phone]

## 2013-05-21 ENCOUNTER — Encounter (HOSPITAL_COMMUNITY): Payer: Self-pay

## 2013-05-21 ENCOUNTER — Inpatient Hospital Stay (HOSPITAL_COMMUNITY): Payer: Medicare Other

## 2013-05-21 LAB — CBC
MCHC: 31.5 g/dL (ref 30.0–36.0)
Platelets: 148 10*3/uL — ABNORMAL LOW (ref 150–400)
RDW: 19.2 % — ABNORMAL HIGH (ref 11.5–15.5)
WBC: 12.8 10*3/uL — ABNORMAL HIGH (ref 4.0–10.5)

## 2013-05-21 LAB — BASIC METABOLIC PANEL
GFR calc Af Amer: >90 mL/min (ref >90)
GFR calc non Af Amer: 80 mL/min — ABNORMAL LOW (ref >90)
Potassium: 3.9 mEq/L (ref 3.5–5.1)
Sodium: 138 mEq/L (ref 135–145)

## 2013-05-21 LAB — GLUCOSE, CAPILLARY: Glucose-Capillary: 54 mg/dL — ABNORMAL LOW (ref 70–99)

## 2013-05-21 MED ORDER — INSULIN GLARGINE 100 UNIT/ML ~~LOC~~ SOLN
20.0000 [IU] | Freq: Every day | SUBCUTANEOUS | Status: DC
  Administered 2013-05-22 – 2013-05-24 (×3): 20 [IU] via SUBCUTANEOUS
  Filled 2013-05-21 (×3): qty 0.2

## 2013-05-21 MED ORDER — GLUCOSE 40 % PO GEL
ORAL | Status: AC
  Administered 2013-05-21: 37.5 g
  Filled 2013-05-21: qty 1

## 2013-05-21 MED ORDER — PREDNISONE 20 MG PO TABS
20.0000 mg | ORAL_TABLET | Freq: Every day | ORAL | Status: DC
  Administered 2013-05-22 – 2013-05-25 (×4): 20 mg via ORAL
  Filled 2013-05-21 (×5): qty 1

## 2013-05-21 NOTE — Progress Notes (Signed)
   SUBJECTIVE:  Breathing OK.  No pain. "I have good days and bad."   PHYSICAL EXAM Filed Vitals:   05/20/13 2057 05/20/13 2114 05/21/13 0225 05/21/13 0426  BP:  108/48  110/63  Pulse:  91  80  Temp:  97.5 F (36.4 C)  97.8 F (36.6 C)  TempSrc:  Oral  Oral  Resp:  18  18  Height:      Weight:    168 lb 10.4 oz (76.5 kg)  SpO2: 100% 98% 99% 98%   General:  No distress Lungs:  Bilateral decreased breath sounds with few wheezes improved Heart:  Regular Abdomen:  Positive bowel sounds, no rebound no guarding Extremities:  Mild ankle edema  LABS:  Results for orders placed during the hospital encounter of 05/04/13 (from the past 24 hour(s))  GLUCOSE, CAPILLARY     Status: Abnormal   Collection Time    05/20/13 11:04 AM      Result Value Range   Glucose-Capillary 313 (*) 70 - 99 mg/dL   Comment 1 Notify RN     Comment 2 Documented in Chart    GLUCOSE, CAPILLARY     Status: Abnormal   Collection Time    05/20/13  4:07 PM      Result Value Range   Glucose-Capillary 276 (*) 70 - 99 mg/dL   Comment 1 Notify RN     Comment 2 Documented in Chart    GLUCOSE, CAPILLARY     Status: Abnormal   Collection Time    05/20/13  9:16 PM      Result Value Range   Glucose-Capillary 274 (*) 70 - 99 mg/dL  CBC     Status: Abnormal   Collection Time    05/21/13  5:35 AM      Result Value Range   WBC 12.8 (*) 4.0 - 10.5 K/uL   RBC 2.83 (*) 4.22 - 5.81 MIL/uL   Hemoglobin 8.4 (*) 13.0 - 17.0 g/dL   HCT 40.9 (*) 81.1 - 91.4 %   MCV 94.3  78.0 - 100.0 fL   MCH 29.7  26.0 - 34.0 pg   MCHC 31.5  30.0 - 36.0 g/dL   RDW 78.2 (*) 95.6 - 21.3 %   Platelets 148 (*) 150 - 400 K/uL    Intake/Output Summary (Last 24 hours) at 05/21/13 0648 Last data filed at 05/21/13 0300  Gross per 24 hour  Intake    840 ml  Output   3001 ml  Net  -2161 ml    ASSESSMENT AND PLAN:  Atrial fibrillation:  No anticoagulation with hemoptysis.  Tele Looks like atrial flutter with controlled vent rate now.  I  will check an EKG.    Acute on chronic diastolic HF:  Excellent urine output again yesterday.  Creat is stable.  Continue Lasix IV BID today again today and follow BMET in the am.       Rollene Rotunda 05/21/2013 6:48 AM

## 2013-05-21 NOTE — Progress Notes (Addendum)
PULMONARY  / CRITICAL CARE MEDICINE  Name: [name] [name] MRN: [id number] DOB: 1936/11/22    ADMISSION DATE:  05/04/2013 CONSULTATION DATE:  05/04/2013  REFERRING MD :  Verlon Au PRIMARY SERVICE:  Hospitalist  CHIEF COMPLAINT:  hemoptysis  BRIEF PATIENT DESCRIPTION: 77 yo WM with hx of severe COPD, CHF (LVEF 55% to 60% on 4/21), and atrial flutter with recent hospitalization from 4/21-4/30 for SBo treated conservatively. Was on Xarelto and presented with hemoptysis and pulmonary infiltrates initially attributed to HCAP.   SIGNIFICANT EVENTS / STUDIES:  5/05 - admitted with hemoptysis  5/05 - 5/7 persistent hemoptysis 5/07- CT chest: dense LLL consolidation, no evidence of tumor or mass 5/08 - early AM - transferred to SDU with increased SOB 5/08 - No further hemoptysis 5/09 - increased dyspnea and worsening hypoxemia 5/13  -Increasing O2 reqt's. Increased infiltrates. Increased WBC count. Elective intubation for FOB 5/13 - One unit PRBCs for tachycardia, hypotension, Hgb 7.1 5/13 - FOB: normal airway exam. No evidence of active bleeding. Minimal mucoid secretions. Cytology >> NO malignant cells identified 5/13 - Hypotension due to sedatives, mech vent - vasopressors initiated 5/14 - Vasculitis serologies ordered: ESR 44, ANA neg, GBM Ab neg, ANCA neg 5/14 - Passed SBT. Extubated to NRB, PRN BiPAP 5/15 - One unit PRBCs for Hgb 6.5 5/16 - Transfer to Tele 5/20 - Daughter agreeable for d/c to SNF Rehab at [facility], patient agrees and feels he needs rehab efforts   LINES / TUBES: ETT 5/13 >> 5/14 L IJ CVL 5/13 >> out R radial A line 5/13 >> 5/15  CULTURES: MRSA PCR 5/5 >> NEG Blood 5/5 >> NEG Resp 5/6 >> mod candida Strep Ag 5/6 >> NEG Legionella 5/6 >> NEG Resp 5/10 >> Mod Candida BAL bacterial cx 5/13 >> NEG BAL Pneumocystis >> NEG Urine 5/14 >> NEG BAL Fungal 5/13 >> smear negative >>  BAL Legionella 513>>neg BAL AFB 5/13 >>neg on prelim>>> Blood 5/14 >>neg  FOB cytology  5/13>>>NEG  ANTIBIOTICS: Cefepime 5/5 >>>5/5 Vanco 5/5 >> 5/9 Zoysn 5/5 >> 5/12 cipro 5/9 >> 5/12 Levofloxacin 5/12 >> 5/18 Fluconazole 5/12 >> 5/20 (stop date ordered)   SUBJECTIVE:  Dyspnea is improving, was able to lay flat last night. Another 2L negative last 24 h. He is having dark blood from R nare and also in his sputum. Daughter feels like he needs to walk more but gets very SOB with exertion.    VITAL SIGNS: Temp:  [97.5 F (36.4 C)-98.3 F (36.8 C)] 97.8 F (36.6 C) (05/22 0426) Pulse Rate:  [74-105] 80 (05/22 0426) Resp:  [18] 18 (05/22 0426) BP: (108-136)/(48-90) 110/63 mmHg (05/22 0426) SpO2:  [96 %-100 %] 98 % (05/22 0426) Weight:  [168 lb 10.4 oz (76.5 kg)] 168 lb 10.4 oz (76.5 kg) (05/22 0426)   PHYSICAL EXAMINATION: General:  NAD, OOB in chair  Neuro:  No focal deficits HEENT: Dried blood R nare Cardiovascular: IRIR s M Lungs: resps even non labored, diminished bases L>R, exp wheeze.   Abdomen:  Soft, nontender, NABS Ext: No clubbing, 1+ pitting edema BLE    Recent Labs Lab 05/19/13 0400 05/20/13 0535 05/21/13 0535  NA 140 139 138  K 3.6 3.6 3.9  CL 97 93* 93*  CO2 39* 37* 38*  BUN 47* 41* 41*  CREATININE 1.14 0.97 0.93  GLUCOSE 176* 122* 56*    Recent Labs Lab 05/19/13 0400 05/20/13 0535 05/21/13 0535  HGB 8.2* 9.1* 8.4*  HCT 25.3* 28.6* 26.7*  WBC 12.2*  20.8* 12.8*  PLT 148* 154 148*   Dg Chest Portable 1 View  05/20/2013   *RADIOLOGY REPORT*  Clinical Data: Follow up of pulmonary edema.  Shortness of breath. Chest tightness.  Cough.  Nonsmoker.  PORTABLE CHEST - 1 VIEW  Comparison: 05/19/2013  Findings: Prior median sternotomy. Cardiomegaly accentuated by [facility] portable technique.  Possible small left pleural effusion. No pneumothorax.  Lower lobe predominant, left greater than right interstitial and airspace opacities are not significantly changed.  IMPRESSION: No significant change since the prior exam.  Left greater than right  interstitial and airspace opacities. Infection versus alveolar pulmonary edema.  Possible small left pleural effusion.   Original Report Authenticated By: Abigail Miyamoto, M.D.    ASSESSMENT / PLAN:  PULMONARY A:  Acute on chronic respiratory failure Hemoptysis - mild recurrence 5/16- neg bronch w/o bleeding site found; now w epistaxis, ? contribution Severe COPD - increased bronchospasm 5/21 Pulmonary infiltrates of unclear etiology - bilat L>R opacities - improving  Mediastinal LAN - likely reactive, but concerning for possible malignancy.  P: Cont steroids and taper > to 20mg  on 5/23 Cont pulm hygiene Cont diuresis as SCr tol - convert IV lasix to PO when advised by Cardiology Wean O2 as tolerated for SpO2 > 90% Cont BDs as scheduled  Will repeat CT scan chest to reassess LLL infiltrate and mediastinal LAD; may consider nodal bx depending on clinical course Cont monitor off abx  Could consider repeat FOB with cont hemoptysis despite holding anticoagulation, but suspect that his residual dark blood is related to his nosebleeds; will adjust nasal steroid if it continues  CARDIOLOGY A: A fib/flutter, better rate control s/p bioprosthetic Aortic Valve replacement with MAZE (Aug 2011, normal coronaries on cath) Elevated BNP P: [place] cards following  Cont aggressive diuresis as tol per Cardiology - likely need to cont IV lasix another day or so with increased BLE edema and SOB.  Will need outpatient diuresis plan from Cardiology once euvolemic, appreciate Dr Tanna Furry help Cont cardizem, asa, PRN metoprolol  Cont to hold anticoagulation for now due to recurrent hemoptysis, will need to decide ultimately outpt whether to reatstart Ok from pulm standpoint to restart amiodarone if it is felt he needs to go home on this (may not be necessary)  RENAL A:  Acute on chronic renal insuff, improved 5/21 Active urinary sediment P: Cont lasix IV BID  Monitor BMET Vasculitis serologies  negative  INFECTIOUS DISEASE A: Suspected L > R LL PNA on presentation - little current evidence of active infection Leukocytosis, resolved Candida on 2 resp cultures P: Off abx / antifungals  Monitor fevers, wbc trend  GI A:  Constipation Recent SBO treated conservatively P: LOC   Advance nutrition as tolerated   HEMATOLOGY A:  Anemia of acute illness - s/p PRBC x 1 this admit. Hgb trending up 5/19.  Thrombocytopenia -  improved P: F/u cbc  transfuse for Hgb < 7.0 or for acute bleeding/symtpomatic anemia   ENDOCRINE A:  DM2 - note A1c was 5.8 in April. Hyperglycemia - anticipate worse control on systemic steroids  P: Reduce lantus to 20, may need further reduction as steroids taper D/c hs coverage with am hypoglycemia  Cont SSI    DISPO Plan for d/c to SNF Rehab at [facility] pending plan from Cardiology for diuresis, antiarrhythmics.  Family now on board with SNF at d/c.  Anticipate d/c in next day or so.   Darlina Sicilian, NP 05/21/2013  11:34 AM Pager: [phone] or [phone]  [phone]  *Care during the described time interval was provided by me and/or other providers on the critical care team. I have reviewed this patient's available data, including medical history, events of note, physical examination and test results as part of my evaluation.  Baltazar Apo, MD, PhD 05/21/2013, 1:36 PM [facility] [phone] or if no answer [phone]

## 2013-05-21 NOTE — Progress Notes (Signed)
Patient: [name] [name] Date of Encounter: 05/21/2013, 7:49 AM Admit date: 05/04/2013     Subjective  Mr. Carreker reports his SOB is improving. He slept well lying flat which he hasn't done in several days. He reports chest "tightness" with coughing but this is also improved. He denies palpitations.   Objective  Physical Exam: Vitals: BP 110/63  Pulse 80  Temp(Src) 97.8 F (36.6 C) (Oral)  Resp 18  Ht 5\' 6"  (1.676 m)  Wt 168 lb 10.4 oz (76.5 kg)  BMI 27.23 kg/m2  SpO2 98% General: Well developed, chronically ill appearing 77 year old male in no acute distress. Neck: Supple. JVD not elevated. Lungs: Coarse breath sounds bilaterally. Breathing is unlabored. Heart: Irregularly irregular S1 S2 without murmurs, rubs, or gallops.  Abdomen: Soft, non-distended. Extremities: No clubbing or cyanosis. Trace pedal edema bilaterally.   Neuro: Alert and oriented X 3. Moves all extremities spontaneously. No focal deficits.  Intake/Output:  Intake/Output Summary (Last 24 hours) at 05/21/13 0749 Last data filed at 05/21/13 0300  Gross per 24 hour  Intake    600 ml  Output   2701 ml  Net  -2101 ml    Inpatient Medications:  . antiseptic oral rinse  15 mL Mouth Rinse BID  . aspirin  81 mg Oral Daily  . diltiazem  240 mg Oral Daily  . enoxaparin (LOVENOX) injection  30 mg Subcutaneous Q24H  . fluticasone  1 spray Each Nare Daily  . furosemide  40 mg Intravenous BID  . glyBURIDE  2.5 mg Oral BID WC  . insulin aspart  0-15 Units Subcutaneous TID WC  . insulin aspart  0-5 Units Subcutaneous QHS  . insulin aspart  3 Units Subcutaneous TID WC  . insulin glargine  25 Units Subcutaneous Daily  . ipratropium  0.5 mg Nebulization Q6H  . levalbuterol  0.63 mg Nebulization Q6H  . predniSONE  30 mg Oral Q breakfast  . sodium chloride  3 mL Intravenous Q12H   . sodium chloride Stopped (05/14/13 1600)    Labs:  Recent Labs  05/20/13 0535 05/21/13 0535  NA 139 138  K 3.6 3.9  CL 93*  93*  CO2 37* 38*  GLUCOSE 122* 56*  BUN 41* 41*  CREATININE 0.97 0.93  CALCIUM 8.3* 8.0*    Recent Labs  05/20/13 0535 05/21/13 0535  WBC 20.8* 12.8*  HGB 9.1* 8.4*  HCT 28.6* 26.7*  MCV 95.0 94.3  PLT 154 148*    Radiology/Studies: Dg Chest Portable 1 View  05/19/2013   Clinical Data: Follow up infiltrates.  Comparison: 05/18/2013.  Findings: Stable cardiac enlargement.  Bilateral interstitial and airspace process not significantly changed.  It could reflect asymmetric edema but is more likely bilateral infiltrates. Probable small left effusion.  IMPRESSION: Persistent bilateral infiltrates.   Original Report Authenticated By: Rudie Meyer, M.D.    Echocardiogram: 04/20/2013 Study Conclusions - Left ventricle: The cavity size was normal. Wall thickness was increased in a pattern of mild LVH. Systolic function was normal. The estimated ejection fraction was in the range of 55% to 60%. - Mitral valve: Calcified annulus. - Left atrium: The atrium was moderately dilated. - Right ventricle: The cavity size was mildly dilated. - Right atrium: The atrium was moderately dilated. - Atrial septum: No defect or patent foramen ovale was identified. - Pulmonary arteries: PA peak pressure: 47mm Hg (S). - Impressions: PA presssures may be underestimated by TR velocity. Morphologic signs of pulmonary hypertension with mild flattenin  gof septuma and RV dilatation   Telemetry: rate controlled AF/AFL    Assessment and Plan  1. Atrial fibrillation/flutter - rate controlled; asymptomatic; continue rate control with diltiazem; off Xarelto due to hemoptysis 2. Chronic diastolic HF - SOB is multifactorial but may need to continue IV Lasix another day or so; MD to advise 3. HCAP - per PCCM 4. Hemoptysis - now off Xarelto/anticoagulation; bronchoscopy negative 5. Severe COPD - per PCCM   Dr. Ladona Ridgel to see Signed, Rick Duff PA-C

## 2013-05-21 NOTE — Progress Notes (Signed)
Hypoglycemic Event  CBG: 54 Treatment: Oral Glucose Gel  Symptoms: Not symptomatic  Follow-up CBG: Time:06:47 CBG Result: 87 Possible Reasons for Event: Poor food intake  Comments/MD notified: No     [name] [name] T  Remember to initiate Hypoglycemia Order Set & complete

## 2013-05-21 NOTE — Progress Notes (Signed)
Physical Therapy Treatment Patient Details Name: [name] [name] MRN: [id number] DOB: [birth year] Today's Date: 05/21/2013 Time: 1478-2956 PT Time Calculation (min): 24 min  PT Assessment / Plan / Recommendation Comments on Treatment Session  Pt OOB in recliner with daughter in room.  Pt very willing but admitts he "can't go to far".  Today pt was on 2 lts nasal O2 at 94% at rest.  Amb pt in hallway twice with one sitting rerst break.  Sats decreased to 74% with 3/4 DOE.  Pt aware he needed to stop and rest.  Pt educated on purse lip breathing but he stated "that's hard to do".  Pt plans to D/C to [facility] for ST Rehab before going back home.    Follow Up Recommendations  SNF (Daughter stated she was looking into [facility] for ST Rehab)     Does the patient have the potential to tolerate intense rehabilitation     Barriers to Discharge        Equipment Recommendations   (dghtr states pt has a simple RW and a cruiser walker with se)    Recommendations for Other Services    Frequency Min 3X/week   Plan Discharge plan remains appropriate;Frequency remains appropriate    Precautions / Restrictions Precautions Precautions: Fall Precaution Comments: Hx COPD Restrictions Weight Bearing Restrictions: No   Pertinent Vitals/Pain No c/o pain     Mobility  Bed Mobility Bed Mobility: Not assessed Details for Bed Mobility Assistance: Pt OOB in recliner with daughter in room Transfers Transfers: Sit to Stand;Stand to Sit Sit to Stand: 3: Mod assist;With upper extremity assist;With armrests;From chair/3-in-1 Stand to Sit: 4: Min assist;With armrests;To chair/3-in-1 Details for Transfer Assistance: VCs for hand placement and to scoot to edge of chair with roacker technique. X 2 attempt to complete. Ambulation/Gait Ambulation/Gait Assistance: 1: +2 Total assist Ambulation/Gait: Patient Percentage: 70% Ambulation Distance (Feet): 35 Feet (25 feet then another 10 feet with one sitting  rerst break) Assistive device: Rolling walker Ambulation/Gait Assistance Details: This time pt was on 2lts nasal at 94% at rest but decreased to 74% during gait.  Daughter assisted by following with recliner as pt demon very limited activity tolerance.   Gait Pattern: Step-through pattern;Decreased stride length;Trunk flexed Gait velocity: slow      PT Goals                                                     progressing    Visit Information  Last PT Received On: 05/21/13 Assistance Needed: +2 (equipment/O2/chair to follow)    Subjective Data  Subjective: I can't go far   Cognition    good   Balance   fair  End of Session PT - End of Session Equipment Utilized During Treatment: Gait belt;Oxygen Activity Tolerance: Patient limited by fatigue Patient left: in chair;with call bell/phone within reach;with family/visitor present   Felecia Shelling  PTA [facility]  Acute  Rehab Pager      [phone]

## 2013-05-22 ENCOUNTER — Inpatient Hospital Stay (HOSPITAL_COMMUNITY): Payer: Medicare Other

## 2013-05-22 DIAGNOSIS — I5033 Acute on chronic diastolic (congestive) heart failure: Secondary | ICD-10-CM | POA: Diagnosis present

## 2013-05-22 LAB — PROTEIN, BODY FLUID: Total protein, fluid: 3.3 g/dL

## 2013-05-22 LAB — CBC
HCT: 27 % — ABNORMAL LOW (ref 39.0–52.0)
RBC: 2.83 MIL/uL — ABNORMAL LOW (ref 4.22–5.81)
RDW: 19.3 % — ABNORMAL HIGH (ref 11.5–15.5)
WBC: 15.6 10*3/uL — ABNORMAL HIGH (ref 4.0–10.5)

## 2013-05-22 LAB — BODY FLUID CELL COUNT WITH DIFFERENTIAL
Lymphs, Fluid: 87 %
Monocyte-Macrophage-Serous Fluid: 8 % — ABNORMAL LOW (ref 50–90)

## 2013-05-22 LAB — LACTATE DEHYDROGENASE, PLEURAL OR PERITONEAL FLUID

## 2013-05-22 LAB — GLUCOSE, CAPILLARY
Glucose-Capillary: 259 mg/dL — ABNORMAL HIGH (ref 70–99)
Glucose-Capillary: 258 mg/dL — ABNORMAL HIGH (ref 70–99)

## 2013-05-22 LAB — LACTATE DEHYDROGENASE: LDH: 383 U/L — ABNORMAL HIGH (ref 94–250)

## 2013-05-22 MED ORDER — GLUCERNA SHAKE PO LIQD
237.0000 mL | Freq: Every day | ORAL | Status: DC
  Administered 2013-05-22 – 2013-05-25 (×2): 237 mL via ORAL

## 2013-05-22 MED ORDER — FUROSEMIDE 40 MG PO TABS
40.0000 mg | ORAL_TABLET | Freq: Two times a day (BID) | ORAL | Status: DC
  Administered 2013-05-22 – 2013-05-23 (×3): 40 mg via ORAL
  Filled 2013-05-22 (×5): qty 1

## 2013-05-22 NOTE — Progress Notes (Signed)
Clinical Social Worker submitted updated clincials to [facility] [employer]. CSW staffed case with RNCM.    Angelia Mould, MSW, [place] [phone]

## 2013-05-22 NOTE — Progress Notes (Signed)
Patient Name: [name] [name] Date of Encounter: 05/22/2013   Principal Problem:   Acute-on-chronic respiratory failure Active Problems:   Atrial flutter   Acute on chronic diastolic CHF (congestive heart failure), NYHA class 1   COPD (chronic obstructive pulmonary disease)   Type 2 diabetes mellitus, uncontrolled   HCAP (healthcare-associated pneumonia)   Thrombocytopenia, unspecified   Hemoptysis   Pulmonary infiltrates   Hypotension   Atrial fibrillation   Chronic renal insufficiency    SUBJECTIVE  "I've felt better."  Dyspnea @ rest.  CT yesterday showed worsening aeration bilaterally.  CURRENT MEDS . antiseptic oral rinse  15 mL Mouth Rinse BID  . aspirin  81 mg Oral Daily  . diltiazem  240 mg Oral Daily  . fluticasone  1 spray Each Nare Daily  . furosemide  40 mg Intravenous BID  . glyBURIDE  2.5 mg Oral BID WC  . insulin aspart  0-15 Units Subcutaneous TID WC  . insulin aspart  3 Units Subcutaneous TID WC  . insulin glargine  20 Units Subcutaneous Daily  . ipratropium  0.5 mg Nebulization Q6H  . levalbuterol  0.63 mg Nebulization Q6H  . predniSONE  20 mg Oral Q breakfast  . sodium chloride  3 mL Intravenous Q12H   OBJECTIVE  Filed Vitals:   05/21/13 1950 05/21/13 2037 05/22/13 0236 05/22/13 0543  BP: 120/71   134/75  Pulse: 90   91  Temp: 98.4 F (36.9 C)   98 F (36.7 C)  TempSrc: Oral   Oral  Resp: 18   18  Height:      Weight:      SpO2: 95% 94% 95% 95%    Intake/Output Summary (Last 24 hours) at 05/22/13 0646 Last data filed at 05/22/13 0500  Gross per 24 hour  Intake    720 ml  Output   2250 ml  Net  -1530 ml   Filed Weights   05/19/13 0501 05/20/13 0431 05/21/13 0426  Weight: 171 lb 11.8 oz (77.9 kg) 165 lb 9.6 oz (75.116 kg) 168 lb 10.4 oz (76.5 kg)   PHYSICAL EXAM  General: Pleasant, NAD. Neuro: Alert and oriented X 3. Moves all extremities spontaneously. Psych: Normal affect. HEENT:  Normal  Neck: Supple without bruits or  JVD. Lungs:  Resp regular and unlabored, diminished breath sounds bilat, R more so than L, left basilar crackles. Heart: RRR no s3, s4, or murmurs. Abdomen: Soft, non-tender, non-distended, BS + x 4.  Extremities: No clubbing, cyanosis.  Trace R > L bilat LE edema. DP/PT/Radials 1+ and equal bilaterally.  Accessory Clinical Findings  CBC  Recent Labs  05/21/13 0535 05/22/13 0420  WBC 12.8* PENDING  HGB 8.4* 8.4*  HCT 26.7* 27.0*  MCV 94.3 95.4  PLT 148* PENDING   Basic Metabolic Panel  Recent Labs  05/20/13 0535 05/21/13 0535  NA 139 138  K 3.6 3.9  CL 93* 93*  CO2 37* 38*  GLUCOSE 122* 56*  BUN 41* 41*  CREATININE 0.97 0.93  CALCIUM 8.3* 8.0*   TELE  Aflutter 70's to 90's.  ECG  Aflutter, 85  Radiology/Studies  Ct Chest Wo Contrast  05/21/2013   *RADIOLOGY REPORT*  Clinical Data: Evaluate pulmonary infiltrates and mediastinal lymphadenopathy.  Persistent nonproductive cough.  CT CHEST WITHOUT CONTRAST   IMPRESSION: 1.  Overall, there is worsening aeration throughout the lungs bilaterally.  Persistent air space consolidation in the left lower lobe is compatible with a residual lobar pneumonia.  There is evidence  of endobronchial spread of infection throughout the remainder of the lungs, most severe in the right middle lobe and right lower lobe. 2.  Enlarging moderate-sized left-sided parapneumonic pleural effusion, which appears to layer dependently at this time. 3. Atherosclerosis, including left main and three-vessel coronary artery disease.   Assessment for potential risk factor modification, dietary therapy or pharmacologic therapy may be warranted, if clinically indicated. 4.  Status post median sternotomy for a bioprosthetic aortic valve replacement. 5.  Mild ectasia of the descending thoracic aorta (4.1 cm in diameter) is unchanged. 6.  Additional incidental findings, similar to prior studies, as above.   Original Report Authenticated By: Trudie Reed, M.D.    ASSESSMENT AND PLAN  1.  Hemoptysis/PNA/L Pleural effusion:  PNA worse by CT yesterday with mod L parapneumonic effusion.  Dyspnea @ rest persists.  Defer to pulm.  2.  Aflutter:  Stable rate, asymptomatic.  Not a candidate for anticoagulation 2/2 #1.  Cont dilt.  3.  Acute on chronic diast CHF:  Weight has been coming down.  Net negative yesterday.  Volume status looks fair.  F/U labs this AM and consider switch to oral lasix.  HR/BP well-controlled.  Signed, Nicolasa Ducking NP  History and all data above reviewed.  Patient examined.  I agree with the findings as above.  The patient exam reveals COR:   ,  Lungs: Diffuse wheezing  ,  Abd: Positive bowel sounds, no rebound no guarding, Ext Mild ankle edema  .  All available labs, radiology testing, previous records reviewed. Agree with documented assessment and plan. He says he is not breathing as well today.  EKG with flutter rate controlled.  Change to PO Lasix today and add compression stockings.   Rollene Rotunda  7:35 AM  05/22/2013

## 2013-05-22 NOTE — Procedures (Signed)
Thoracentesis Procedure Note  Pre-operative Diagnosis: Left pleural effusion  Post-operative Diagnosis: same  Indications: Diagnostic & Therapeutic Evaluation  Procedure Details  Consent: Informed consent was obtained. Risks of the procedure were discussed including: infection, bleeding, pain, pneumothorax.  Under sterile conditions the patient was positioned. Betadine solution and sterile drapes were utilized.  1% buffered lidocaine was used to anesthetize the 7th rib space. Fluid was obtained without any difficulties and minimal blood loss.  A dressing was applied to the wound and wound care instructions were provided.   Findings 1000 ml of cloudy pleural fluid was obtained. A sample was sent to Pathology for cytology, LDH, Protein and cell counts, as well as for infection analysis.  Complications:  None; patient tolerated the procedure well.          Condition: stable  Plan A follow up chest x-ray was ordered. Bed Rest for 1 hour.  Procedure performed under direct supervision of Dr. Tyson Alias and with ultrasound guidance for real time vessel cannulation.     [name], NP-C [facility] Pgr: [phone] or [phone]    Attending Attestation: I was present for the entire procedure.  Levy Pupa, MD, PhD 05/22/2013, 11:45 AM [facility] [phone] or if no answer [phone]

## 2013-05-22 NOTE — Progress Notes (Signed)
Hypoglycemic Event  CBG: 47   Treatment: 15 GM carbohydrate snack  Symptoms: None  Follow-up CBG: [id number] CBG Result:98  Possible Reasons for Event: Unknown  Comments/MD notified:Yes    [name] [name]  Remember to initiate Hypoglycemia Order Set & complete

## 2013-05-22 NOTE — Progress Notes (Signed)
Physical Therapy Treatment Patient Details Name: [name] [name] MRN: [id number] DOB: 1936/03/23 Today's Date: 05/22/2013 Time: 4098-1191 PT Time Calculation (min): 26 min  PT Assessment / Plan / Recommendation Comments on Treatment Session  Pt very limited with mobility but tolerated ther-ex well. Patient on 4 liters O2 with range from 96% at rest seated EOB to 84% with very minimal ambulation.  Will continue to work with patient and progress activity as tolerated. Still highly rec ST SNF prior to d/c home as patient is severely deconditioned and unsafe for mobility at this time secondary to endurance difficulties.    Follow Up Recommendations  SNF (Daughter stated she was looking into [facility] for ST [place])     Does the patient have the potential to tolerate intense rehabilitation     Barriers to Discharge        Equipment Recommendations   (dghtr states pt has a simple RW and a cruiser walker with se)    Recommendations for Other Services    Frequency Min 3X/week   Plan Discharge plan remains appropriate;Frequency remains appropriate    Precautions / Restrictions Precautions Precautions: Fall Precaution Comments: Hx COPD, watch O2 SATS Restrictions Weight Bearing Restrictions: No   Pertinent Vitals/Pain No pain reported; SpO2 at rest EOB 96% on 4 liters, with standing 90% on 4 liters, with ambulation 86% on 4 liters with HR upper 120s    Mobility  Bed Mobility Bed Mobility: Supine to Sit Supine to Sit: 5: Supervision;HOB elevated Sitting - Scoot to Edge of Bed: 5: Supervision Transfers Transfers: Sit to Stand;Stand to Sit Sit to Stand: 4: Min assist;With upper extremity assist;With armrests;From chair/3-in-1 Stand to Sit: 4: Min assist;With armrests;To bed Details for Transfer Assistance: assistance from lower surfaces Ambulation/Gait Ambulation/Gait Assistance: 4: Min assist Ambulation Distance (Feet): 18 Feet Assistive device: Rolling walker Ambulation/Gait  Assistance Details: pt significantly limited with ambulation; desaturated to mind 80s with minimal activity on 4 liters Gait Pattern: Step-through pattern;Decreased stride length;Trunk flexed Gait velocity: slow General Gait Details: no episodes of LOB, VCs for position with rw    Exercises General Exercises - Upper Extremity Shoulder Flexion: AROM;Both;10 reps;Seated Shoulder ABduction: Strengthening;Both;10 reps;Seated Shoulder ADduction: Strengthening;Both;10 reps;Seated Elbow Flexion: AROM;Strengthening;Both;10 reps Elbow Extension: AROM;Strengthening;Both;10 reps General Exercises - Lower Extremity Ankle Circles/Pumps: Strengthening;Both;10 reps Long Arc Quad: Strengthening;Both;10 reps Hip Flexion/Marching: Strengthening;10 reps;Seated;Both Toe Raises: AROM;Both;10 reps;Standing Heel Raises: AROM;Both;20 reps Mini-Sqauts: AROM;Both;10 reps;Standing   PT Diagnosis:    PT Problem List:   PT Treatment Interventions:     PT Goals Acute Rehab PT Goals PT Goal Formulation: With patient Time For Goal Achievement: 06/05/13 Potential to Achieve Goals: Good Pt will go Supine/Side to Sit: with modified independence;Other (comment) PT Goal: Supine/Side to Sit - Progress: Progressing toward goal Pt will go Sit to Supine/Side: with modified independence;with HOB 0 degrees PT Goal: Sit to Supine/Side - Progress: Progressing toward goal Pt will Ambulate: >150 feet;with supervision;with least restrictive assistive device PT Goal: Ambulate - Progress: Not progressing Pt will Go Up / Down Stairs: 3-5 stairs;with supervision;with least restrictive assistive device PT Goal: Up/Down Stairs - Progress: Not met Pt will Perform Home Exercise Program: Independently PT Goal: Perform Home Exercise Program - Progress: Progressing toward goal  Visit Information  Last PT Received On: 05/22/13 Assistance Needed: +2    Subjective Data  Subjective: I am really wiped out Patient Stated Goal: to feel  better   Cognition  Cognition Arousal/Alertness: Awake/alert Behavior During Therapy: [facility] for tasks assessed/performed Overall Cognitive  Status: Impaired/Different from baseline Area of Impairment: Problem solving Problem Solving: Requires verbal cues;Slow processing General Comments: Cognition appears better today; however, increased time for processing    Balance  Balance Balance Assessed: Yes Static Standing Balance Static Standing - Balance Support: Bilateral upper extremity supported Static Standing - Level of Assistance: 4: Min assist  End of Session PT - End of Session Equipment Utilized During Treatment: Gait belt;Oxygen Activity Tolerance: Patient limited by fatigue Patient left: in chair;with call bell/phone within reach;with family/visitor present Nurse Communication: Mobility status   GP     Fabio Asa 05/22/2013, 3:33 PM Charlotte Crumb, PT DPT  [phone]

## 2013-05-22 NOTE — Progress Notes (Signed)
Inpatient Diabetes Program Recommendations  AACE/ADA: New Consensus Statement on Inpatient Glycemic Control (2013)  Target Ranges:  Prepandial:   less than 140 mg/dL      Peak postprandial:   less than 180 mg/dL (1-2 hours)      Critically ill patients:  140 - 180 mg/dL     Results for NASSIR, NEIDERT (MRN [id number]) as of 05/22/2013 12:47  Ref. Range 05/21/2013 06:11 05/21/2013 06:47 05/21/2013 11:38 05/21/2013 16:28 05/21/2013 21:12  Glucose-Capillary Latest Range: 70-99 mg/dL 54 (L) 87 409 (H) 811 (H) 128 (H)    Results for JOSHWA, HEMRIC (MRN [id number]) as of 05/22/2013 12:47  Ref. Range 05/22/2013 06:15 05/22/2013 06:48 05/22/2013 11:05  Glucose-Capillary Latest Range: 70-99 mg/dL 47 (L) 98 213 (H)    Patient was hypoglycemic the last two mornings.  Patient received 25 units of Lantus yesterday morning.  Only scheduled to receive 20 units Lantus today.  Noted patient still having elevated glucose levels after meals.  Recommend the following insulin adjustments: 1. Decrease Lantus further to 15 units daily 2. Increase Novolog meal coverage to 5 units tid with meals   Will follow. Ambrose Finland RN, MSN, CDE Diabetes Coordinator Inpatient Diabetes Program [phone]

## 2013-05-22 NOTE — Progress Notes (Signed)
PULMONARY  / CRITICAL CARE MEDICINE  Name: [name] [name] MRN: [id number] DOB: December 16, 1936    ADMISSION DATE:  05/04/2013 CONSULTATION DATE:  05/04/2013  REFERRING MD :  Verlon Au PRIMARY SERVICE:  Hospitalist  CHIEF COMPLAINT:  hemoptysis  BRIEF PATIENT DESCRIPTION: 77 yo WM with hx of severe COPD, CHF (LVEF 55% to 60% on 4/21), and atrial flutter with recent hospitalization from 4/21-4/30 for SBo treated conservatively. Was on Xarelto and presented with hemoptysis and pulmonary infiltrates initially attributed to HCAP.   SIGNIFICANT EVENTS / STUDIES:  5/05 - admitted with hemoptysis  5/05 - 5/7 persistent hemoptysis 5/07- CT chest: dense LLL consolidation, no evidence of tumor or mass 5/08 - early AM - transferred to SDU with increased SOB 5/08 - No further hemoptysis 5/09 - increased dyspnea and worsening hypoxemia 5/13  -Increasing O2 reqt's. Increased infiltrates. Increased WBC count. Elective intubation for FOB 5/13 - One unit PRBCs for tachycardia, hypotension, Hgb 7.1 5/13 - FOB: normal airway exam. No evidence of active bleeding. Minimal mucoid secretions. Cytology >> NO malignant cells identified 5/13 - Hypotension due to sedatives, mech vent - vasopressors initiated 5/14 - Vasculitis serologies ordered: ESR 44, ANA neg, GBM Ab neg, ANCA neg 5/14 - Passed SBT. Extubated to NRB, PRN BiPAP 5/15 - One unit PRBCs for Hgb 6.5 5/16 - Transfer to Tele 5/20 - Daughter agreeable for d/c to SNF Rehab at [facility], patient agrees and feels he needs rehab efforts 5/23 - L Thoracentesis with 1056ml of rusty pleural fluid removed   LINES / TUBES: ETT 5/13 >> 5/14 L IJ CVL 5/13 >> out R radial A line 5/13 >> 5/15  CULTURES: MRSA PCR 5/5 >> NEG Blood 5/5 >> NEG Resp 5/6 >> mod candida Strep Ag 5/6 >> NEG Legionella 5/6 >> NEG Resp 5/10 >> Mod Candida BAL bacterial cx 5/13 >> NEG BAL Pneumocystis >> NEG Urine 5/14 >> NEG BAL Fungal 5/13 >> smear negative >>  BAL Legionella 513>>neg BAL  AFB 5/13 >>neg on prelim>>> Blood 5/14 >>neg  FOB cytology 5/13>>>NEG  ANTIBIOTICS: Cefepime 5/5 >>>5/5 Vanco 5/5 >> 5/9 Zoysn 5/5 >> 5/12 cipro 5/9 >> 5/12 Levofloxacin 5/12 >> 5/18 Fluconazole 5/12 >> 5/20 (stop date ordered)   SUBJECTIVE:  "I feel terrible.  I am so short of breath".    VITAL SIGNS: Temp:  [98 F (36.7 C)-98.4 F (36.9 C)] 98 F (36.7 C) (05/23 0543) Pulse Rate:  [89-91] 91 (05/23 0543) Resp:  [18-20] 18 (05/23 0543) BP: (112-134)/(55-75) 134/75 mmHg (05/23 0543) SpO2:  [94 %-98 %] 95 % (05/23 0543) Weight:  [165 lb 5.5 oz (75 kg)] 165 lb 5.5 oz (75 kg) (05/23 0543)   PHYSICAL EXAMINATION: General:  Elderly male, NAD Neuro:  No focal deficits HEENT: mm pink/moist, no jvd Cardiovascular: IRIR s M Lungs: resp's mildly labored, diminished bases L>R, no wheeze Abdomen:  Soft, nontender, NABS Ext: No clubbing, 1+ pitting edema BLE    Recent Labs Lab 05/19/13 0400 05/20/13 0535 05/21/13 0535  NA 140 139 138  K 3.6 3.6 3.9  CL 97 93* 93*  CO2 39* 37* 38*  BUN 47* 41* 41*  CREATININE 1.14 0.97 0.93  GLUCOSE 176* 122* 56*    Recent Labs Lab 05/20/13 0535 05/21/13 0535 05/22/13 0420  HGB 9.1* 8.4* 8.4*  HCT 28.6* 26.7* 27.0*  WBC 20.8* 12.8* 15.6*  PLT 154 148* 174   Dg Chest 2 View  05/22/2013   *RADIOLOGY REPORT*  Clinical Data: Follow-up edema.  Question left  effusion.  CHEST - 2 VIEW  Comparison: CT chest 05/21/2013 and chest radiograph 05/20/2013.  Findings: Trachea is midline.  Heart is at the upper limits of normal in size.  There is bibasilar dependent air space disease superimposed on emphysema.  Moderate left pleural effusion.  Tiny right pleural effusion.  Flowing anterior osteophytosis is seen in the thoracic spine.  IMPRESSION:  1.  Bibasilar air space disease, as before, indicative of multilobar pneumonia.  Edema not excluded. 2.  Bilateral pleural effusions, left greater than right.   Original Report Authenticated By: Lorin Picket, M.D.   Ct Chest Wo Contrast  05/21/2013   *RADIOLOGY REPORT*  Clinical Data: Evaluate pulmonary infiltrates and mediastinal lymphadenopathy.  Persistent nonproductive cough.  CT CHEST WITHOUT CONTRAST  Technique:  Multidetector CT imaging of the chest was performed following the standard protocol without IV contrast.  Comparison: Chest CT 05/06/2013.  Findings:  Mediastinum: Heart size is enlarged with dilatation of the right atrium.  There is no significant pericardial fluid, thickening or pericardial calcification. There is atherosclerosis of the thoracic aorta, the great vessels of the mediastinum and the coronary arteries, including calcified atherosclerotic plaque in the left main, left anterior descending, left circumflex and right coronary arteries. Status post median sternotomy for aortic valve replacement (there appears to be a stented bioprosthesis in position).  Ectasia of the proximal descending thoracic aorta and near the anastomotic site of the bioprosthetic valve measuring up to approximately 4.1 cm in diameter, similar to prior studies. No pathologically enlarged mediastinal or hilar lymph nodes. Please note that accurate exclusion of hilar adenopathy is limited on noncontrast CT scans.  Numerous borderline enlarged mediastinal lymph nodes are presumably reactive, including a cluster of subcarinal nodes which appear matted together.  Esophagus is unremarkable in appearance.  Lungs/Pleura: There continues to be extensive confluent air space consolidation and numerous air bronchograms throughout the left lower lobe, compatible with persistent left lower lobe pneumonia. There is now an enlarging moderate left-sided parapneumonic pleural effusion which appears to layer dependently.  Scattered throughout the lungs bilaterally there are increasing areas of patchy ground glass attenuation and septal thickening, concerning for endobronchial spread of infection, most severe in the right lower lobe  posteriorly and in the right middle lobe.  The background of moderate centrilobular emphysema and mild diffuse bronchial wall thickening is similar to the prior study.  Frothy secretions are noted in the distal trachea and proximal right main stem bronchi bilaterally (left greater than right).  Upper Abdomen: Low-attenuation bilateral renal lesions similar to prior studies.  Status post cholecystectomy.  Musculoskeletal: There are no aggressive appearing lytic or blastic lesions noted in the visualized portions of the skeleton. Sternotomy wires.  IMPRESSION: 1.  Overall, there is worsening aeration throughout the lungs bilaterally.  Persistent air space consolidation in the left lower lobe is compatible with a residual lobar pneumonia.  There is evidence of endobronchial spread of infection throughout the remainder of the lungs, most severe in the right middle lobe and right lower lobe. 2.  Enlarging moderate-sized left-sided parapneumonic pleural effusion, which appears to layer dependently at this time. 3. Atherosclerosis, including left main and three-vessel coronary artery disease.   Assessment for potential risk factor modification, dietary therapy or pharmacologic therapy may be warranted, if clinically indicated. 4.  Status post median sternotomy for a bioprosthetic aortic valve replacement. 5.  Mild ectasia of the descending thoracic aorta (4.1 cm in diameter) is unchanged. 6.  Additional incidental findings, similar to prior studies, as  above.   Original Report Authenticated By: Vinnie Langton, M.D.   Dg Chest Portable 1 View  05/20/2013   *RADIOLOGY REPORT*  Clinical Data: Follow up of pulmonary edema.  Shortness of breath. Chest tightness.  Cough.  Nonsmoker.  PORTABLE CHEST - 1 VIEW  Comparison: 05/19/2013  Findings: Prior median sternotomy. Cardiomegaly accentuated by [facility] portable technique.  Possible small left pleural effusion. No pneumothorax.  Lower lobe predominant, left greater than right  interstitial and airspace opacities are not significantly changed.  IMPRESSION: No significant change since the prior exam.  Left greater than right interstitial and airspace opacities. Infection versus alveolar pulmonary edema.  Possible small left pleural effusion.   Original Report Authenticated By: Abigail Miyamoto, M.D.    ASSESSMENT / PLAN:  PULMONARY A:  Acute on chronic respiratory failure Hemoptysis - mild recurrence 5/16- neg bronch w/o bleeding site found; now w epistaxis, ? contribution Severe COPD - increased bronchospasm 5/21 Pulmonary infiltrates of unclear etiology - bilat L>R opacities - improving. 5/22 repeat CT with LLL consolidation, RUL airspace disease Mediastinal LAN - likely reactive, but concerning for possible malignancy.  Left Pleural Effusion - s/p thora on 5/23 with 1054ml rusty fluid removed  P: -Cont steroids and taper > to 20mg  on 5/23 -Pulm hygiene -Cont diuresis as SCr tol - convert IV lasix to PO when advised by -Cardiology -Wean O2 as tolerated for SpO2 > 90% -BDs as scheduled  -monitor off abx  -Could consider repeat FOB with cont hemoptysis despite holding anticoagulation, but suspect that his residual dark blood is related to his nosebleeds and resolving PNA; will adjust nasal steroid if it continues -D/c flonase as may be contributing to nose bleeds -Follow pleural studies   CARDIOLOGY A: A fib/flutter, better rate control s/p bioprosthetic Aortic Valve replacement with MAZE (Aug 2011, normal coronaries on cath) Elevated BNP P: -[place]rick cards following  -Cont aggressive diuresis as tol per Cardiology - likely need to cont IV lasix another day or so with increased BLE edema and SOB.  -Will need outpatient diuresis plan from Cardiology once euvolemic, appreciate Dr Tanna Furry help -Cont cardizem, asa, PRN metoprolol  -Cont to hold anticoagulation for now due to recurrent hemoptysis, will need to decide ultimately outpt whether to reatstart -Ok from  pulm standpoint to restart amiodarone if it is felt he needs to go home on this (may not be necessary)  RENAL A:  Acute on chronic renal insuff, improved 5/21 Active urinary sediment P: -Cont lasix IV BID  -Monitor BMET -Vasculitis serologies negative  INFECTIOUS DISEASE A: Suspected L > R LL PNA on presentation - little current evidence of active infection Leukocytosis, resolved Candida on 2 resp cultures P: -Off abx / antifungals  -Monitor fevers, wbc trend  GI A:  Constipation Recent SBO treated conservatively P: -LOC   -Advance nutrition as tolerated   HEMATOLOGY A:  Anemia of acute illness - s/p PRBC x 1 this admit. Hgb trending up 5/19.  Thrombocytopenia -  improved P: -F/u cbc  -transfuse for Hgb < 7.0 or for acute bleeding/symtpomatic anemia   ENDOCRINE A:  DM2 - note A1c was 5.8 in April. Hyperglycemia - anticipate worse control on systemic steroids  P: -lantus @ 20, may need further reduction as steroids taper -D/c HS coverage with am hypoglycemia  -Cont SSI    DISPO Plan for d/c to SNF Rehab at [facility] pending plan from Cardiology for diuresis, antiarrhythmics.  Family now on board with SNF at d/c.    Velna Hatchet  Veleta Miners, NP-C [facility] Pgr: [phone] or [phone]    *Care during the described time interval was provided by me and/or other providers on the critical care team. I have reviewed this patient's available data, including medical history, events of note, physical examination and test results as part of my evaluation.  Levy Pupa, MD, PhD 05/22/2013, 11:54 AM [facility] [phone] or if no answer [phone]

## 2013-05-22 NOTE — Progress Notes (Signed)
Occupational Therapy Treatment Patient Details Name: [name] [name] MRN: [id number] DOB: 15-Aug-1936 Today's Date: 05/22/2013 Time: 4098-1191 OT Time Calculation (min): 26 min  OT Assessment / Plan / Recommendation Comments on Treatment Session   77 yo WM with hx of severe COPD, CHF (LVEF 55% to 60% on 4/21), and atrial flutter with recent hospitalization from 4/21-4/30 for SBo treated conservatively. Was on Xarelto and presented with hemoptysis and pulmonary infiltrates initially attributed to HCAP. Pt had thorencentesis today and was fatigued. Feel that pt would benefit from rehab at SNF prior to D/C home. If however pt refuses SNF, recommend 24/7 assistance and HHOT. Will continue to follow for ADL retraining with AE and E conservation techniques and functional mobility.     Follow Up Recommendations  Supervision/Assistance - 24 hour;SNF    Barriers to Discharge       Equipment Recommendations  3 in 1 bedside comode;Tub/shower seat    Recommendations for Other Services    Frequency Min 2X/week   Plan Discharge plan remains appropriate    Precautions / Restrictions Precautions Precautions: Fall Precaution Comments: Hx COPD, watch O2 SATS Restrictions Weight Bearing Restrictions: No   Pertinent Vitals/Pain O2 @ rest @ 96 4L. Sit - stand desat to 90.  Ambulated @ 25ft desat to 86 4L. > 1.5 min to rebound to >90    ADL  Grooming: Wash/dry face;Simulated;Wash/dry hands;Set up Where Assessed - Grooming: Supported sitting Toilet Transfer: Minimal assistance;Simulated Statistician Method: Sit to stand;Stand pivot Acupuncturist: Raised toilet seat with arms (or 3-in-1 over toilet) Toileting - Clothing Manipulation and Hygiene: Min guard Where Assessed - Toileting Clothing Manipulation and Hygiene: Sit to stand from 3-in-1 or toilet Equipment Used: Other (comment) (O2) Transfers/Ambulation Related to ADLs: PT ambulated to door after encouragement on 4L O2. Sesat to  87 ADL Comments: Pt able to wash face with VCs for minor confusion.  Pt stated washing face with paper on the soap.  When cued to check the soap he did recognize the problem but continued to have some problem solving deficits during session.      OT Diagnosis:    OT Problem List:   OT Treatment Interventions:     OT Goals Acute Rehab OT Goals OT Goal Formulation: With patient/family Time For Goal Achievement: 06/03/13 Potential to Achieve Goals: Good ADL Goals Pt Will Perform Grooming: Sitting at sink;Standing at sink;Other (comment);with supervision;with set-up ADL Goal: Grooming - Progress: Revised due to lack of progress Pt Will Perform Upper Body Bathing: Sit to stand from chair;Sitting at sink;with set-up;with supervision ADL Goal: Upper Body Bathing - Progress: Revised due to lack of progress Pt Will Perform Lower Body Bathing: with min assist;Sit to stand from chair;Sit to stand from bed ADL Goal: Lower Body Bathing - Progress: Progressing toward goals Pt Will Perform Upper Body Dressing: Sit to stand from chair;with set-up;with supervision ADL Goal: Upper Body Dressing - Progress: Revised due to lack of progress Pt Will Perform Lower Body Dressing: with min assist;Sit to stand from chair;Sit to stand from bed ADL Goal: Lower Body Dressing - Progress: Progressing toward goals Pt Will Transfer to Toilet: Ambulation;Comfort height toilet;with supervision ADL Goal: Toilet Transfer - Progress: Revised due to lack of progress Additional ADL Goal #1: Pt will participate in 10 mins therapeutic activity with no more than 2 rests and dyspnea 2/4 ADL Goal: Additional Goal #1 - Progress: Revised due to lack of progress Miscellaneous OT Goals Miscellaneous OT Goal #1: Pt will demonstrate basic transfer  with oxygen saturations >90 % to decr fall risk with adls OT Goal: Miscellaneous Goal #1 - Progress: Progressing toward goals  Visit Information  Last OT Received On: 05/22/13 Assistance  Needed: +2 PT/OT Co-Evaluation/Treatment: Yes    Subjective Data      Prior Functioning       Cognition  Cognition Arousal/Alertness: Awake/alert Behavior During Therapy: WFL for tasks assessed/performed Overall Cognitive Status: Impaired/Different from baseline Area of Impairment: Problem solving Problem Solving: Requires verbal cues;Slow processing General Comments: Cognition appears better today; however, increased time for processing    Mobility  Bed Mobility Bed Mobility: Supine to Sit Supine to Sit: 5: Supervision;HOB elevated Sitting - Scoot to Edge of Bed: 5: Supervision Transfers Transfers: Sit to Stand;Stand to Sit Sit to Stand: With upper extremity assist;With armrests;From chair/3-in-1;4: Min assist Stand to Sit: 4: Min assist;With armrests;To bed Details for Transfer Assistance: assistance from lower surfaces    Exercises  General Exercises - Upper Extremity Shoulder Flexion: AROM;Both;10 reps;Seated Shoulder ABduction: Strengthening;Both;10 reps;Seated Shoulder ADduction: Strengthening;Both;10 reps;Seated Elbow Flexion: AROM;Strengthening;Both;10 reps Elbow Extension: AROM;Strengthening;Both;10 reps   Balance Balance Balance Assessed: Yes Static Standing Balance Static Standing - Balance Support: Bilateral upper extremity supported Static Standing - Level of Assistance: 4: Min assist   End of Session OT - End of Session Equipment Utilized During Treatment: Gait belt Activity Tolerance: Patient limited by fatigue Patient left: in chair;with call bell/phone within reach;with family/visitor present Nurse Communication: Mobility status;Other (comment) (desat 4 L to 87 with activity)  GO     Gershom Brobeck,[name] 05/22/2013, 3:22 PM [facility], OTR/L  [phone] 05/22/2013

## 2013-05-22 NOTE — Progress Notes (Addendum)
INITIAL NUTRITION ASSESSMENT  DOCUMENTATION CODES Per approved criteria  -Not Applicable   INTERVENTION:  Glucerna Shake daily (220 kcals, 9.9 gm protein per 8 fl oz bottle) RD to follow for nutrition care plan  NUTRITION DIAGNOSIS: Increased nutrient needs related to skin integrity as evidenced by estimated nutrition needs  Goal: Oral intake with meals & supplements to meet >/= 90% of estimated nutrition needs  Monitor:  PO & supplemental intake, weight, labs, I/O's  Reason for Assessment: Wound   77 y.o. male  Admitting Dx: Acute-on-chronic respiratory failure  ASSESSMENT: Patient with hx of severe COPD, CHF and atrial flutter with recent hospitalization from 4/21-4/30 for SBO treated conservatively; presented [facility] ED with hemoptysis and pulmonary infiltrates initially attributed to HCAP.  Patient s/p thoracentesis today for fluid removal; RD spoke with patient's daughter at bedside; PO intake 50-100% per flowsheet records; noted Stage II sacral pressure ulcer documented, however, per RN healing; daughter amenable to RD ordering nutrition supplement.  Height: Ht Readings from Last 1 Encounters:  05/07/13 5\' 6"  (1.676 m)    Weight: Wt Readings from Last 1 Encounters:  05/22/13 165 lb 5.5 oz (75 kg)    Ideal Body Weight: 142 lb  % Ideal Body Weight: 116%  Wt Readings from Last 10 Encounters:  05/22/13 165 lb 5.5 oz (75 kg)  04/26/13 180 lb 1.9 oz (81.7 kg)  10/27/12 180 lb (81.647 kg)  10/27/12 180 lb (81.647 kg)  09/29/12 180 lb (81.647 kg)  04/01/12 185 lb (83.915 kg)  02/03/12 178 lb (80.74 kg)  10/26/11 177 lb (80.287 kg)  10/16/11 192 lb 10.9 oz (87.4 kg)  03/23/11 185 lb (83.915 kg)    Usual Body Weight: 180 lb  % Usual Body Weight: 92%  BMI:  Body mass index is 26.7 kg/(m^2).  Estimated Nutritional Needs: Kcal: 1800-2000 Protein: 90-100 gm Fluid: 1.8-2.0 L  Skin: Stage II pressure ulcer to sacrum  Diet Order: Carb Control  EDUCATION  NEEDS: -No education needs identified at this time   Intake/Output Summary (Last 24 hours) at 05/22/13 1351 Last data filed at 05/22/13 0900  Gross per 24 hour  Intake    240 ml  Output   1400 ml  Net  -1160 ml    Last BM: 5/22  Labs:   Recent Labs Lab 05/19/13 0400 05/20/13 0535 05/21/13 0535  NA 140 139 138  K 3.6 3.6 3.9  CL 97 93* 93*  CO2 39* 37* 38*  BUN 47* 41* 41*  CREATININE 1.14 0.97 0.93  CALCIUM 7.5* 8.3* 8.0*  GLUCOSE 176* 122* 56*    CBG (last 3)   Recent Labs  05/22/13 0615 05/22/13 0648 05/22/13 1105  GLUCAP 47* 98 259*    Scheduled Meds: . antiseptic oral rinse  15 mL Mouth Rinse BID  . aspirin  81 mg Oral Daily  . diltiazem  240 mg Oral Daily  . furosemide  40 mg Oral BID  . glyBURIDE  2.5 mg Oral BID WC  . insulin aspart  0-15 Units Subcutaneous TID WC  . insulin aspart  3 Units Subcutaneous TID WC  . insulin glargine  20 Units Subcutaneous Daily  . ipratropium  0.5 mg Nebulization Q6H  . levalbuterol  0.63 mg Nebulization Q6H  . predniSONE  20 mg Oral Q breakfast  . sodium chloride  3 mL Intravenous Q12H    Continuous Infusions: . sodium chloride Stopped (05/14/13 1600)    Past Medical History  Diagnosis Date  . COPD (chronic  obstructive pulmonary disease)   . AF (atrial fibrillation)   . Hypertension   . Gout   . Arthritis   . Small bowel obstruction   . Renal insufficiency   . Aortic stenosis   . Hydropneumothorax   . Diabetes mellitus   . CHF (congestive heart failure)   . On home O2     qhs prn  . Pneumonia 05/04/2013    history of pneumonia  . Pneumonia 10/12  . Hydropneumothorax 2012    Past Surgical History  Procedure Laterality Date  . Rotator cuff repair      left and right  . Cardiac valve replacement      aortic valve with a tissue prosthesis and left sided maze proc  . S/p rewiring of sternum for dehiscence    . Gallbladder surgery    . US echocardiography  01-03-11    EF 55-60%  . Cardiovascular  stress test  01-26-09    EF 67%  . Coronary angioplasty    . Cholecystectomy    . Sternal incision reclosure  08/29/2010    sternal rewiring  . Cataract extraction w/phaco  10/02/2012    Procedure: CATARACT EXTRACTION PHACO AND INTRAOCULAR LENS PLACEMENT (IOC);  Surgeon: Gemma Payor, MD;  Location: [facility];  Service: Ophthalmology;  Laterality: Right;  CDE 16.68  . Cataract extraction w/phaco  10/27/2012    Procedure: CATARACT EXTRACTION PHACO AND INTRAOCULAR LENS PLACEMENT (IOC);  Surgeon: Gemma Payor, MD;  Location: [facility];  Service: Ophthalmology;  Laterality: Left;  CDE:16.89  . Artificial aortic valve      Maureen Chatters, RD, LDN Pager #: [phone] After-Hours Pager #: [phone]

## 2013-05-23 ENCOUNTER — Inpatient Hospital Stay (HOSPITAL_COMMUNITY): Payer: Medicare Other

## 2013-05-23 DIAGNOSIS — E8809 Other disorders of plasma-protein metabolism, not elsewhere classified: Secondary | ICD-10-CM | POA: Diagnosis present

## 2013-05-23 DIAGNOSIS — D649 Anemia, unspecified: Secondary | ICD-10-CM | POA: Diagnosis present

## 2013-05-23 LAB — GLUCOSE, CAPILLARY
Glucose-Capillary: 78 mg/dL (ref 70–99)
Glucose-Capillary: 235 mg/dL — ABNORMAL HIGH (ref 70–99)

## 2013-05-23 LAB — CBC
HCT: 27.5 % — ABNORMAL LOW (ref 39.0–52.0)
Hemoglobin: 8.5 g/dL — ABNORMAL LOW (ref 13.0–17.0)
MCH: 29.1 pg (ref 26.0–34.0)
MCV: 94.2 fL (ref 78.0–100.0)
Platelets: 163 10*3/uL (ref 150–400)
RBC: 2.92 MIL/uL — ABNORMAL LOW (ref 4.22–5.81)
WBC: 13 10*3/uL — ABNORMAL HIGH (ref 4.0–10.5)

## 2013-05-23 LAB — BASIC METABOLIC PANEL
CO2: 38 mEq/L — ABNORMAL HIGH (ref 19–32)
Chloride: 92 mEq/L — ABNORMAL LOW (ref 96–112)
Glucose, Bld: 66 mg/dL — ABNORMAL LOW (ref 70–99)
Potassium: 4 mEq/L (ref 3.5–5.1)
Sodium: 137 mEq/L (ref 135–145)

## 2013-05-23 MED ORDER — FUROSEMIDE 40 MG PO TABS
60.0000 mg | ORAL_TABLET | Freq: Every day | ORAL | Status: DC
  Administered 2013-05-24 – 2013-05-26 (×3): 60 mg via ORAL
  Filled 2013-05-23 (×3): qty 1

## 2013-05-23 NOTE — Progress Notes (Addendum)
Hypoglycemic Event  CBG: 61 at 0507  Treatment: 15 grams carbohydrate snack  Symptoms: none  Follow-up CBG: Time: 0530 CBG Result: 78  Possible Reasons for Event: unknown    Kaylyn Lim, Joyice Faster  Remember to initiate Hypoglycemia Order Set & complete

## 2013-05-23 NOTE — Progress Notes (Signed)
PULMONARY  / CRITICAL CARE MEDICINE  Name: [name] [name] MRN: [id number] DOB: [birth year]    ADMISSION DATE:  05/04/2013 CONSULTATION DATE:  05/04/2013  REFERRING MD :  Verlon Au PRIMARY SERVICE:  Hospitalist  CHIEF COMPLAINT:  hemoptysis  BRIEF PATIENT DESCRIPTION: 77 yo WM with hx of severe COPD, CHF (LVEF 55% to 60% on 4/21), and atrial flutter with recent hospitalization from 4/21-4/30 for SBo treated conservatively. Was on Xarelto and presented with hemoptysis and pulmonary infiltrates initially attributed to HCAP.   SIGNIFICANT EVENTS / STUDIES:  5/05 - admitted with hemoptysis  5/05 - 5/7 persistent hemoptysis 5/07- CT chest: dense LLL consolidation, no evidence of tumor or mass 5/08 - early AM - transferred to SDU with increased SOB 5/08 - No further hemoptysis 5/09 - increased dyspnea and worsening hypoxemia 5/13  -Increasing O2 reqt's. Increased infiltrates. Increased WBC count. Elective intubation for FOB 5/13 - One unit PRBCs for tachycardia, hypotension, Hgb 7.1 5/13 - FOB: normal airway exam. No evidence of active bleeding. Minimal mucoid secretions. Cytology >> NO malignant cells identified 5/13 - Hypotension due to sedatives, mech vent - vasopressors initiated 5/14 - Vasculitis serologies ordered: ESR 44, ANA neg, GBM Ab neg, ANCA neg 5/14 - Passed SBT. Extubated to NRB, PRN BiPAP 5/15 - One unit PRBCs for Hgb 6.5 5/16 - Transfer to Tele 5/20 - Daughter agreeable for d/c to SNF Rehab at [facility], patient agrees and feels he needs rehab efforts 5/23 - L Thoracentesis with 1099ml of rusty pleural fluid removed   LINES / TUBES: ETT 5/13 >> 5/14 L IJ CVL 5/13 >> out R radial A line 5/13 >> 5/15  CULTURES: MRSA PCR 5/5 >> NEG Blood 5/5 >> NEG Resp 5/6 >> mod candida Strep Ag 5/6 >> NEG Legionella 5/6 >> NEG Resp 5/10 >> Mod Candida BAL bacterial cx 5/13 >> NEG BAL Pneumocystis >> NEG Urine 5/14 >> NEG BAL Fungal 5/13 >> smear negative >>  BAL Legionella 513>>neg BAL  AFB 5/13 >>neg on prelim>>> Blood 5/14 >>neg  FOB cytology 5/13>>>NEG  ANTIBIOTICS: Cefepime 5/5 >>>5/5 Vanco 5/5 >> 5/9 Zoysn 5/5 >> 5/12 cipro 5/9 >> 5/12 Levofloxacin 5/12 >> 5/18 Fluconazole 5/12 >> 5/20 (stop date ordered)   SUBJECTIVE:  Pt tells me he had a good night, and feels well today.  Sitting up in chair eating with no increased wob.  No new complaints.  Is coughing up brown appearing mucus in suction device, but no BRB.    VITAL SIGNS: Temp:  [97.6 F (36.4 C)-98.4 F (36.9 C)] 98.2 F (36.8 C) (05/24 0627) Pulse Rate:  [78-98] 78 (05/24 0627) Resp:  [20] 20 (05/24 0627) BP: (103-121)/(59-74) 105/59 mmHg (05/24 0627) SpO2:  [91 %-94 %] 91 % (05/24 0824) Weight:  [74 kg (163 lb 2.3 oz)] 74 kg (163 lb 2.3 oz) (05/24 2831)   PHYSICAL EXAMINATION: General:  Elderly male, NAD Neuro:  Alert and oriented, moves all 4.  HEENT: nose without purulence or d/c, op clear Neck without LN or TMG Cardiovascular: irreg with CVR Lungs: decreased bs both bases with mild basilar crackles.  No wheezing Abdomen:  Soft, nontender, NABS Ext: No clubbing, mild edema BLE    Recent Labs Lab 05/20/13 0535 05/21/13 0535 05/23/13 0525  NA 139 138 137  K 3.6 3.9 4.0  CL 93* 93* 92*  CO2 37* 38* 38*  BUN 41* 41* 33*  CREATININE 0.97 0.93 1.01  GLUCOSE 122* 56* 66*    Recent Labs Lab 05/21/13 0535 05/22/13  0420 05/23/13 0525  HGB 8.4* 8.4* 8.5*  HCT 26.7* 27.0* 27.5*  WBC 12.8* 15.6* 13.0*  PLT 148* 174 163   Dg Chest 2 View  05/22/2013   *RADIOLOGY REPORT*  Clinical Data: Follow-up edema.  Question left effusion.  CHEST - 2 VIEW  Comparison: CT chest 05/21/2013 and chest radiograph 05/20/2013.  Findings: Trachea is midline.  Heart is at the upper limits of normal in size.  There is bibasilar dependent air space disease superimposed on emphysema.  Moderate left pleural effusion.  Tiny right pleural effusion.  Flowing anterior osteophytosis is seen in the thoracic spine.   IMPRESSION:  1.  Bibasilar air space disease, as before, indicative of multilobar pneumonia.  Edema not excluded. 2.  Bilateral pleural effusions, left greater than right.   Original Report Authenticated By: Lorin Picket, M.D.   Ct Chest Wo Contrast  05/21/2013   *RADIOLOGY REPORT*  Clinical Data: Evaluate pulmonary infiltrates and mediastinal lymphadenopathy.  Persistent nonproductive cough.  CT CHEST WITHOUT CONTRAST  Technique:  Multidetector CT imaging of the chest was performed following the standard protocol without IV contrast.  Comparison: Chest CT 05/06/2013.  Findings:  Mediastinum: Heart size is enlarged with dilatation of the right atrium.  There is no significant pericardial fluid, thickening or pericardial calcification. There is atherosclerosis of the thoracic aorta, the great vessels of the mediastinum and the coronary arteries, including calcified atherosclerotic plaque in the left main, left anterior descending, left circumflex and right coronary arteries. Status post median sternotomy for aortic valve replacement (there appears to be a stented bioprosthesis in position).  Ectasia of the proximal descending thoracic aorta and near the anastomotic site of the bioprosthetic valve measuring up to approximately 4.1 cm in diameter, similar to prior studies. No pathologically enlarged mediastinal or hilar lymph nodes. Please note that accurate exclusion of hilar adenopathy is limited on noncontrast CT scans.  Numerous borderline enlarged mediastinal lymph nodes are presumably reactive, including a cluster of subcarinal nodes which appear matted together.  Esophagus is unremarkable in appearance.  Lungs/Pleura: There continues to be extensive confluent air space consolidation and numerous air bronchograms throughout the left lower lobe, compatible with persistent left lower lobe pneumonia. There is now an enlarging moderate left-sided parapneumonic pleural effusion which appears to layer dependently.   Scattered throughout the lungs bilaterally there are increasing areas of patchy ground glass attenuation and septal thickening, concerning for endobronchial spread of infection, most severe in the right lower lobe posteriorly and in the right middle lobe.  The background of moderate centrilobular emphysema and mild diffuse bronchial wall thickening is similar to the prior study.  Frothy secretions are noted in the distal trachea and proximal right main stem bronchi bilaterally (left greater than right).  Upper Abdomen: Low-attenuation bilateral renal lesions similar to prior studies.  Status post cholecystectomy.  Musculoskeletal: There are no aggressive appearing lytic or blastic lesions noted in the visualized portions of the skeleton. Sternotomy wires.  IMPRESSION: 1.  Overall, there is worsening aeration throughout the lungs bilaterally.  Persistent air space consolidation in the left lower lobe is compatible with a residual lobar pneumonia.  There is evidence of endobronchial spread of infection throughout the remainder of the lungs, most severe in the right middle lobe and right lower lobe. 2.  Enlarging moderate-sized left-sided parapneumonic pleural effusion, which appears to layer dependently at this time. 3. Atherosclerosis, including left main and three-vessel coronary artery disease.   Assessment for potential risk factor modification, dietary therapy or pharmacologic therapy  may be warranted, if clinically indicated. 4.  Status post median sternotomy for a bioprosthetic aortic valve replacement. 5.  Mild ectasia of the descending thoracic aorta (4.1 cm in diameter) is unchanged. 6.  Additional incidental findings, similar to prior studies, as above.   Original Report Authenticated By: Trudie Reed, M.D.   Dg Chest Port 1 View  05/23/2013   *RADIOLOGY REPORT*  Clinical Data: Follow up pneumonia.  PORTABLE CHEST - 1 VIEW 05/23/2013 0622 hours:  Comparison: Portable chest x-ray yesterday and  05/20/2013 and two- view chest x-ray yesterday.  CT chest 05/21/2013.  Findings: Prior sternotomy for CABG.  Cardiac silhouette enlarged but stable.  Emphysematous changes in the upper lobes.  Improvement in the patchy airspace opacities throughout both lungs, most confluent in the left lower lobe, though moderate opacity persists. Stable left pleural effusion.  No new pulmonary parenchymal abnormalities.  IMPRESSION: Improving pneumonia in both lungs, though moderate patchy airspace opacities persist.  Stable small left pleural effusion.  No new abnormalities.   Original Report Authenticated By: Hulan Saas, M.D.   Dg Chest Port 1 View  05/22/2013   *RADIOLOGY REPORT*  Clinical Data: Post thoracentesis  PORTABLE CHEST - 1 VIEW  Comparison: Portable exam 1155 hours compared to 0817 hours  Findings: Upper normal heart size post median sternotomy and AVR. Atherogenic calcification aorta. Bibasilar airspace infiltrates most consistent with pneumonia, greater on left. Small left pleural effusion, decreased. Upper lungs grossly clear. No definite pneumothorax. Bones appear demineralized.  IMPRESSION: No pneumothorax. Bibasilar airspace infiltrates most consistent with pneumonia, greater on left, little changed.   Original Report Authenticated By: Ulyses Southward, M.D.    ASSESSMENT / PLAN:  PULMONARY A:  Acute on chronic respiratory failure Hemoptysis - mild recurrence 5/16- neg bronch w/o bleeding site found; now w epistaxis, ? Contribution.  No recent hemoptysis per pt today.  Severe COPD - increased bronchospasm 5/21 Pulmonary infiltrates of unclear etiology - bilat L>R opacities - improving. 5/22 repeat CT with LLL consolidation, RUL airspace disease Mediastinal LAN - likely reactive, but concerning for possible malignancy.  Left Pleural Effusion - s/p thora on 5/23 with rusty fluid removed  P: -Cont steroids and taper > to 20mg  on 5/23 -Pulm hygiene -Cont diuresis as SCr tol - convert IV  lasix to PO when advised by -Cardiology -Follow pleural studies  -would hold anticoagulation a little longer to see how he does.   CARDIOLOGY A: A fib/flutter, better rate control s/p bioprosthetic Aortic Valve replacement with MAZE (Aug 2011, normal coronaries on cath) Elevated BNP P: -[place] cards following  -Cont aggressive diuresis as tol per Cardiology - likely need to cont IV lasix another day or so with increased BLE edema and SOB.  -Will need outpatient diuresis plan from Cardiology once euvolemic, appreciate Dr Lubertha Basque help -Cont cardizem, asa, PRN metoprolol  -Ok from pulm standpoint to restart amiodarone if it is felt he needs to go home on this (may not be necessary)  RENAL A:  Acute on chronic renal insuff, improved 5/21 Active urinary sediment P: -Cont lasix IV BID  -Monitor BMET -Vasculitis serologies negative  ENDOCRINE A:  DM2 - note A1c was 5.8 in April. Hyperglycemia - anticipate worse control on systemic steroids  P: -lantus @ 20, may need further reduction as steroids taper -SSI

## 2013-05-23 NOTE — Progress Notes (Signed)
Pt ambulated in hallway 20 feet with rolling walker, 2 L O2, and standby assist x2. Pt stopped to rest twice. Overall pt tolerated well.  Alfonso Ellis, RN

## 2013-05-23 NOTE — Progress Notes (Addendum)
[name] [name]  77 y.o.  male  Subjective: Patient denies dyspnea or chest discomfort; no palpitations. He walked in the hall today with assistance.  Allergy: Review of patient's allergies indicates no known allergies.  Objective: Vital signs in last 24 hours: Temp:  [97.6 F (36.4 C)-98.4 F (36.9 C)] 98.2 F (36.8 C) (05/24 0627) Pulse Rate:  [78-104] 78 (05/24 0627) Resp:  [20] 20 (05/24 0627) BP: (103-125)/(59-74) 105/59 mmHg (05/24 0627) SpO2:  [91 %-96 %] 91 % (05/24 0824) Weight:  [74 kg (163 lb 2.3 oz)] 74 kg (163 lb 2.3 oz) (05/24 0627)  74 kg (163 lb 2.3 oz) Body mass index is 26.34 kg/(m^2).  Weight change: -1 kg (-2 lb 3.3 oz) Last BM Date: 05/20/13  Intake/Output from previous day: 05/23 0701 - 05/24 0700 In: 720 [P.O.:720] Out: 1325 [Urine:1325] Total I/O since admission:  -13 L; Weight: 7 kg decrease since 5/5  General- Well developed; no acute distress Neck- No JVD Lungs- moderate expiratory wheezing with prolonged expiratory phase Cardiovascular- normal PMI; normal S1 and S2 irregular rhythm; modest systolic ejection murmur Abdomen- normal bowel sounds; soft and non-tender without masses or organomegaly Skin- Warm, no significant lesions Extremities- Nl distal pulses; trace lower extremity edema  CMP     Component Value Date/Time   NA 137 05/23/2013 0525   K 4.0 05/23/2013 0525   CL 92* 05/23/2013 0525   CO2 38* 05/23/2013 0525   GLUCOSE 66* 05/23/2013 0525   BUN 33* 05/23/2013 0525   CREATININE 1.01 05/23/2013 0525   CALCIUM 8.4 05/23/2013 0525   PROT 6.8 05/22/2013 1217   ALBUMIN 1.9* 05/09/2013 0419   AST 14 05/09/2013 0419   ALT 16 05/09/2013 0419   ALKPHOS 72 05/09/2013 0419   BILITOT 1.8* 05/09/2013 0419   GFRNONAA 71* 05/23/2013 0525   GFRAA 82* 05/23/2013 0525       Component Value Date/Time   WBC 13.0* 05/23/2013 0525   HGB 8.5* 05/23/2013 0525   HCT 27.5* 05/23/2013 0525   MCV 94.2 05/23/2013 0525       Component Value Date/Time   CHOL 112  05/22/2013 1220   TRIG 63.0 10/10/2011 0847   HDL 34.90* 10/10/2011 0847   CHOLHDL 4 10/10/2011 0847   VLDL 12.6 10/10/2011 0847   LDLCALC 97 10/10/2011 0847   Telemetry: Atrial fibrillation/flutter with controlled ventricular response  Ct Chest Wo Contrast  05/21/2013   Overall, there is worsening aeration throughout the lungs bilaterally.  Persistent air space consolidation in the left lower lobe is compatible with a residual lobar pneumonia. Enlarging moderate-sized left-sided parapneumonic pleural effusion.  Atherosclerosis, including left main and three-vessel coronary artery disease.   Status post median sternotomy for a bioprosthetic aortic valve replacement.  Dg Chest [facility]  05/23/13  Improving pneumonia in both lungs, though moderate patchy airspace opacities persist.  Stable small left pleural effusion.   Imaging: Imaging results have been reviewed  Medications:  I have reviewed the patient's current medications. Scheduled: . aspirin  81 mg Oral Daily  . diltiazem  240 mg Oral Daily  . furosemide  40 mg Oral BID  . glyBURIDE  2.5 mg Oral BID WC  . insulin glargine  20 Units Subcutaneous Daily  . ipratropium  0.5 mg Nebulization Q6H  . levalbuterol  0.63 mg Nebulization Q6H  . predniSONE  20 mg Oral Q breakfast    Assessment/Plan: Acute on chronic diastolic congestive heart failure: Normal EF; diuresis continues on low to moderate dose oral furosemide;  appears to be nearing dry weight, which will be substantially lower than his previous baseline of approximately 180 pounds  Atrial flutter: Heart rate control is adequate.  Low to moderate risk for thromboembolism.  Previously treated with aspirin and rivaroxaban.  Hemoptysis has moderated, and resumption of anticoagulation can be considered if pulmonary agrees.  Physical deconditioning, malnutrition, anemia-will require substantial rehabilitation.  LOS: 19 days   [place] Bing 05/23/2013, 9:42 AM

## 2013-05-24 LAB — GLUCOSE, CAPILLARY
Glucose-Capillary: 76 mg/dL (ref 70–99)
Glucose-Capillary: 370 mg/dL — ABNORMAL HIGH (ref 70–99)

## 2013-05-24 MED ORDER — MAGNESIUM HYDROXIDE 400 MG/5ML PO SUSP: 30.0000 mL | ORAL | Status: DC | PRN

## 2013-05-24 MED ORDER — INSULIN GLARGINE 100 UNIT/ML ~~LOC~~ SOLN
20.0000 [IU] | SUBCUTANEOUS | Status: DC
  Filled 2013-05-24: qty 0.2

## 2013-05-24 MED ORDER — INSULIN GLARGINE 100 UNIT/ML ~~LOC~~ SOLN: 20.0000 [IU] | Freq: Every day | SUBCUTANEOUS | Status: DC

## 2013-05-24 MED ORDER — MAGNESIUM HYDROXIDE 400 MG/5ML PO SUSP
30.0000 mL | Freq: Once | ORAL | Status: DC
  Filled 2013-05-24: qty 30

## 2013-05-24 NOTE — Progress Notes (Addendum)
SUBJECTIVE:He is breathing a little better.Complaints of constipation.  Principal Problem:   Acute-on-chronic respiratory failure Active Problems:   Atrial flutter   COPD (chronic obstructive pulmonary disease)   CKD (chronic kidney disease) stage 3, GFR 30-59 ml/min   Type 2 diabetes mellitus, uncontrolled   HCAP (healthcare-associated pneumonia)   Hemoptysis   Pulmonary infiltrates   Acute on chronic diastolic CHF (congestive heart failure), NYHA class 1   Anemia, normocytic normochromic   Hypoalbuminemia   LABS: Basic Metabolic Panel:  Recent Labs  09/81/19 0525  NA 137  K 4.0  CL 92*  CO2 38*  GLUCOSE 66*  BUN 33*  CREATININE 1.01  CALCIUM 8.4   Liver Function Tests:  Recent Labs  05/22/13 1217  PROT 6.8   CBC:  Recent Labs  05/22/13 0420 05/23/13 0525  WBC 15.6* 13.0*  HGB 8.4* 8.5*  HCT 27.0* 27.5*  MCV 95.4 94.2  PLT 174 163   Fasting Lipid Panel:  Recent Labs  05/22/13 1220  CHOL 112   RADIOLOGY: Dg Chest Port 1 View  05/23/2013   * Patchy airspace opacities persist.  Stable small left pleural effusion.     PHYSICAL EXAM BP 113/65  Pulse 74  Temp(Src) 98.4 F (36.9 C) (Oral)  Resp 18  Ht 5\' 6"  (1.676 m)  Wt 162 lb 11.2 oz (73.8 kg)  BMI 26.27 kg/m2  SpO2 94%Wt Loss 10 lbs since admission  I&O: -14 L over the course of hospitalization and 900 cc over past 24 hours  General: Well developed, well nourished, in no acute distress Head: Eyes PERRLA, No xanthomas.   Normal cephalic and atramatic  Lungs: Left base crackles to the middle lobe,  expiratory wheezes with prolonged expiratory phase . Continues occasionally productive coughing with scant brown sputum. Continues to have difficulty in clearing secretions Heart: HRRR S1 S2, irregular rhythm;  modest systolic murmur  .  Pulses are 2+ & equal.            No carotid bruit. No JVD.  No abdominal bruits. No femoral bruits. Abdomen: Bowel sounds are positive, abdomen mild distention and  non-tender without masses. Msk:  Back normal, slow gait. Normal strength and tone for age. Extremities: No clubbing, cyanosis, mild ankle edema. Left foot slightly cool, DP pulse thready on the left; no pedal edema; scant presacral edema . Neuro: Alert and oriented X 3. Psych:  Good affect, responds appropriately  TELEMETRY: Reviewed telemetry pt in: atrial flutter with controlled ventricular response; monomorphic PVCs .   ASSESSMENT AND PLAN:  1. Acute on Chronic Diastolic CHF: Has diuresed 10 lbs if admssion wt is accurate. He is breathing better, with continued rales in the left base. Continues on lasix 60 mg po daily. Wt is down 10 lbs. Slow recovery but persistent improvement. CXR demonstrates improving pneumonia.   2. Atrial flutter: He remains in NSR rate of 70 bpm.Pulmonary does not yet wish to restart anticoagulation. Remains on diltiazem 240 mg daily. On ASA 81 mg daily.  3.Pneumonia: Per pulmonary. No abx currently.  4. Diminished pulses in left DP: Most recent ABI completed in 2009 was normal.  Denies intermittent claudication symptoms with PT ambulation.  Bettey Mare. Lyman Bishop NP [facility] 05/24/2013, 8:04 AM  Cardiology Attending Patient interviewed and examined. Discussed with Joni Reining, NP.  Above note annotated and modified based upon my findings.  Overall improvement continues. Symptoms and pulmonary exam somewhat better than yesterday.  Patient walked short distances without difficulty.  With  plans for SNF, he is approaching discharge.  He does not require amiodarone for rate control, and I am not inclined to pursue a rhythm control strategy at present.  We can assume responsibility for cardiology issues once he is transferred to the [facility].  [employer] Bing, MD 05/24/2013, 8:33 AM

## 2013-05-24 NOTE — Progress Notes (Signed)
Pt ambulated 10 ft and stated he could not ambulate any further. Pt assisted back to bed. Wife at bedside. Will continue to monitor.

## 2013-05-24 NOTE — Progress Notes (Signed)
Pt ambulated in hallway 20 feet using rolling walker, 2 L O2, and standby assist. Pt stopped twice during walk to rest. Pt tolerated well.  Alfonso Ellis, RN

## 2013-05-24 NOTE — Progress Notes (Addendum)
PULMONARY  / CRITICAL CARE MEDICINE  Name: [name] [name] MRN: [id number] DOB: June 23, 1936    ADMISSION DATE:  05/04/2013 CONSULTATION DATE:  05/04/2013  REFERRING MD :  Verlon Au PRIMARY SERVICE:  Hospitalist  CHIEF COMPLAINT:  hemoptysis  BRIEF PATIENT DESCRIPTION: 77 yo WM with hx of severe COPD, CHF (LVEF 55% to 60% on 4/21), and atrial flutter with recent hospitalization from 4/21-4/30 for SBo treated conservatively. Was on Xarelto and presented with hemoptysis and pulmonary infiltrates initially attributed to HCAP.   SIGNIFICANT EVENTS / STUDIES:  5/05 - admitted with hemoptysis  5/05 - 5/7 persistent hemoptysis 5/07- CT chest: dense LLL consolidation, no evidence of tumor or mass 5/08 - early AM - transferred to SDU with increased SOB 5/08 - No further hemoptysis 5/09 - increased dyspnea and worsening hypoxemia 5/13  -Increasing O2 reqt's. Increased infiltrates. Increased WBC count. Elective intubation for FOB 5/13 - One unit PRBCs for tachycardia, hypotension, Hgb 7.1 5/13 - FOB: normal airway exam. No evidence of active bleeding. Minimal mucoid secretions. Cytology >> NO malignant cells identified 5/13 - Hypotension due to sedatives, mech vent - vasopressors initiated 5/14 - Vasculitis serologies ordered: ESR 44, ANA neg, GBM Ab neg, ANCA neg 5/14 - Passed SBT. Extubated to NRB, PRN BiPAP 5/15 - One unit PRBCs for Hgb 6.5 5/16 - Transfer to Tele 5/20 - Daughter agreeable for d/c to SN[facility] Re[facility] at [facility], patient agrees and feels he needs rehab efforts 5/23 - L Thoracentesis with 108ml of rusty pleural fluid removed   LINES / TUBES: ETT 5/13 >> 5/14 L IJ CVL 5/13 >> out R radial A line 5/13 >> 5/15  CULTURES: MRSA PCR 5/5 >> NEG Blood 5/5 >> NEG Resp 5/6 >> mod candida Strep Ag 5/6 >> NEG Legionella 5/6 >> NEG Resp 5/10 >> Mod Candida BAL bacterial cx 5/13 >> NEG BAL Pneumocystis >> NEG Urine 5/14 >> NEG BAL Fungal 5/13 >> smear negative >>  BAL Legionella 513>>neg BAL  AFB 5/13 >>neg on prelim>>> Blood 5/14 >>neg  FOB cytology 5/13>>>NEG  ANTIBIOTICS: Cefepime 5/5 >>>5/5 Vanco 5/5 >> 5/9 Zoysn 5/5 >> 5/12 cipro 5/9 >> 5/12 Levofloxacin 5/12 >> 5/18 Fluconazole 5/12 >> 5/20 (stop date ordered)   SUBJECTIVE:  Pt tells me he had a good night, but doesn't feel as well today.  Walked yesterday, difficult today.  Sitting up in chair eating with no increased wob.  No new complaints.  Is coughing up brown appearing mucus in suction device, but no BRB.    VITAL SIGNS: Temp:  [98.3 F (36.8 C)-98.6 F (37 C)] 98.4 F (36.9 C) (05/25 0617) Pulse Rate:  [74-118] 74 (05/25 0617) Resp:  [18] 18 (05/25 0617) BP: (113-118)/(58-68) 113/65 mmHg (05/25 0617) SpO2:  [90 %-96 %] 90 % (05/25 0812) Weight:  [71.215 kg (157 lb)-73.8 kg (162 lb 11.2 oz)] 73.8 kg (162 lb 11.2 oz) (05/25 0240)   PHYSICAL EXAMINATION: General:  Elderly male, NAD Neuro:  Alert and oriented, moves all 4.  HEENT: nose without purulence or d/c, op clear Neck without LN or TMG Cardiovascular: irreg with CVR Lungs: decreased bs both bases with mild basilar crackles.  No wheezing Abdomen:  Soft, nontender, NABS Ext: No clubbing, mild edema BLE    Recent Labs Lab 05/20/13 0535 05/21/13 0535 05/23/13 0525  NA 139 138 137  K 3.6 3.9 4.0  CL 93* 93* 92*  CO2 37* 38* 38*  BUN 41* 41* 33*  CREATININE 0.97 0.93 1.01  GLUCOSE 122* 56*  66*    Recent Labs Lab 05/21/13 0535 05/22/13 0420 05/23/13 0525  HGB 8.4* 8.4* 8.5*  HCT 26.7* 27.0* 27.5*  WBC 12.8* 15.6* 13.0*  PLT 148* 174 163   Dg Chest Port 1 View  05/23/2013   *RADIOLOGY REPORT*  Clinical Data: Follow up pneumonia.  PORTABLE CHEST - 1 VIEW 05/23/2013 0622 hours:  Comparison: Portable chest x-ray yesterday and 05/20/2013 and two- view chest x-ray yesterday.  CT chest 05/21/2013.  Findings: Prior sternotomy for CABG.  Cardiac silhouette enlarged but stable.  Emphysematous changes in the upper lobes.  Improvement in the  patchy airspace opacities throughout both lungs, most confluent in the left lower lobe, though moderate opacity persists. Stable left pleural effusion.  No new pulmonary parenchymal abnormalities.  IMPRESSION: Improving pneumonia in both lungs, though moderate patchy airspace opacities persist.  Stable small left pleural effusion.  No new abnormalities.   Original Report Authenticated By: Evangeline Dakin, M.D.    ASSESSMENT / PLAN:  PULMONARY A:  Acute on chronic respiratory failure Hemoptysis - mild recurrence 5/16- neg bronch w/o bleeding site found; now w epistaxis, ? Contribution.  No recent hemoptysis per pt today.  Severe COPD - increased bronchospasm 5/21.  On prednisone taper Pulmonary infiltrates of unclear etiology - bilat L>R opacities - improving. 5/22 repeat CT with LLL consolidation, RUL airspace disease Mediastinal LAN - likely reactive, but concerning for possible malignancy.  Left Pleural Effusion - s/p thora on 5/23 with 1065ml rusty fluid removed.  Studies reveal exudate, lymphocyte predominant cellularity, cytology pending.   P: -Cont steroids and taper > to 20mg  on 5/23 -Pulm hygiene -Cont diuresis as SCr tol -Cardiology -Follow pleural cytology  -would hold anticoagulation a little longer to see how he does.   CARDIOLOGY A: A fib/flutter, better rate control s/p bioprosthetic Aortic Valve replacement with MAZE (Aug 2011, normal coronaries on cath) Elevated BNP P: -[place] cards following  -Cont aggressive diuresis as tol per Cardiology - likely need to cont IV lasix another day or so with increased BLE edema and SOB.  -Will need outpatient diuresis plan from Cardiology once euvolemic, appreciate Dr Tanna Furry help -Cont cardizem, asa, PRN metoprolol  -Ok from pulm standpoint to restart amiodarone if it is felt he needs to go home on this (may not be necessary)  RENAL A:  Acute on chronic renal insuff, improved 5/21 Active urinary sediment P: -Cont  lasix -Monitor BMET -Vasculitis serologies negative  ENDOCRINE A:  DM2 - note A1c was 5.8 in April. Hyperglycemia - anticipate worse control on systemic steroids  P: -receiving lantus at 10am, resulting in low sugars in afternoons and high first thing in am.  Will change lantus to after supper.

## 2013-05-25 ENCOUNTER — Inpatient Hospital Stay (HOSPITAL_COMMUNITY): Payer: Medicare Other

## 2013-05-25 LAB — BASIC METABOLIC PANEL
BUN: 39 mg/dL — ABNORMAL HIGH (ref 6–23)
Calcium: 8.2 mg/dL — ABNORMAL LOW (ref 8.4–10.5)
Creatinine, Ser: 1.04 mg/dL (ref 0.50–1.35)
GFR calc non Af Amer: 68 mL/min — ABNORMAL LOW (ref >90)
Glucose, Bld: 130 mg/dL — ABNORMAL HIGH (ref 70–99)
Sodium: 133 mEq/L — ABNORMAL LOW (ref 135–145)

## 2013-05-25 LAB — CBC
Hemoglobin: 8.1 g/dL — ABNORMAL LOW (ref 13.0–17.0)
MCH: 30.2 pg (ref 26.0–34.0)
MCHC: 31.9 g/dL (ref 30.0–36.0)
MCV: 94.8 fL (ref 78.0–100.0)

## 2013-05-25 LAB — BODY FLUID CULTURE: Gram Stain: NONE SEEN

## 2013-05-25 LAB — GLUCOSE, CAPILLARY
Glucose-Capillary: 86 mg/dL (ref 70–99)
Glucose-Capillary: 437 mg/dL — ABNORMAL HIGH (ref 70–99)

## 2013-05-25 LAB — GLUCOSE, RANDOM: Glucose, Bld: 479 mg/dL — ABNORMAL HIGH (ref 70–99)

## 2013-05-25 MED ORDER — PREDNISONE 10 MG PO TABS
10.0000 mg | ORAL_TABLET | Freq: Every day | ORAL | Status: DC
  Administered 2013-05-26: 10 mg via ORAL
  Filled 2013-05-25 (×3): qty 1

## 2013-05-25 MED ORDER — INSULIN GLARGINE 100 UNIT/ML ~~LOC~~ SOLN
30.0000 [IU] | SUBCUTANEOUS | Status: DC
Start: 2013-05-25 — End: 2013-05-26
  Administered 2013-05-25: 30 [IU] via SUBCUTANEOUS
  Filled 2013-05-25 (×2): qty 0.3

## 2013-05-25 MED ORDER — GLYBURIDE 5 MG PO TABS
5.0000 mg | ORAL_TABLET | Freq: Two times a day (BID) | ORAL | Status: DC
  Administered 2013-05-25 – 2013-05-26 (×2): 5 mg via ORAL
  Filled 2013-05-25 (×4): qty 1

## 2013-05-25 NOTE — Progress Notes (Signed)
Pt ambulated 50 feet in hallway with rolling walker, 2 L O2, and standby assist. Pt stopped twice to rest. Tolerated well. Will continue to monitor.  Alfonso Ellis, RN

## 2013-05-25 NOTE — Progress Notes (Signed)
05/25/2013 4:15 PM Nursing note Pt. Ambulated 50 ft in hallway with RN, Rolling Walker and on 2L o2 [place]. Pt. Tolerated well. Encouraged further ambulation with assistance. Will continue to monitor.  Esther Bradstreet, Blanchard Kelch

## 2013-05-25 NOTE — Progress Notes (Signed)
Occupational Therapy Treatment Patient Details Name: [name] [name] MRN: [id number] DOB: [birth year] Today's Date: 05/25/2013 Time: 4098-1191 OT Time Calculation (min): 27 min  OT Assessment / Plan / Recommendation Comments on Treatment Session Making steady progress;however, limited by O2 desat and dyspnea. Supine 94. Sitting EOB 92. After donning robe 90.Desat to 82 2L after ambulating @ 50 ft with 1 extended rest break. Rebound to 94 @1 .5 min. Dyspnea 3/4. Pt will benefit from skilled OT services to facilitate D/C to next venue due to below deficits.    Follow Up Recommendations  SNF    Barriers to Discharge       Equipment Recommendations  3 in 1 bedside comode;Tub/shower seat    Recommendations for Other Services    Frequency Min 2X/week   Plan Discharge plan remains appropriate    Precautions / Restrictions Precautions Precautions: Fall Precaution Comments: Hx COPD, watch O2 SATS Restrictions Weight Bearing Restrictions: No   Pertinent Vitals/Pain Lowest O2 82 2L after ambulation. Rebounded to 94 after a.5 min. Dyspnea 3/4.  HR 140s. nsg aware    ADL  Grooming: Wash/dry face;Simulated;Wash/dry hands;Set up Where Assessed - Grooming: Supported sitting Toilet Transfer: Other (comment) (using urinal) Equipment Used: Rolling walker;Gait belt Transfers/Ambulation Related to ADLs: MinA sit - stand from lower level with max vc for technique. Ambulated 2 L RW level. Able to ambulate increased distance today ADL Comments: Pt improving with his ability to perform ADL    OT Diagnosis:    OT Problem List:   OT Treatment Interventions:     OT Goals Acute Rehab OT Goals OT Goal Formulation: With patient/family Time For Goal Achievement: 06/03/13 Potential to Achieve Goals: Good ADL Goals Pt Will Perform Grooming: Sitting at sink;Standing at sink;Other (comment);with supervision;with set-up ADL Goal: Grooming - Progress: Progressing toward goals Pt Will Perform Upper Body  Bathing: Sit to stand from chair;Sitting at sink;with set-up;with supervision ADL Goal: Upper Body Bathing - Progress: Progressing toward goals Pt Will Perform Lower Body Bathing: with min assist;Sit to stand from chair;Sit to stand from bed ADL Goal: Lower Body Bathing - Progress: Progressing toward goals Pt Will Perform Upper Body Dressing: Sit to stand from chair;with set-up;with supervision ADL Goal: Upper Body Dressing - Progress: Progressing toward goals Pt Will Perform Lower Body Dressing: with min assist;Sit to stand from chair;Sit to stand from bed ADL Goal: Lower Body Dressing - Progress: Progressing toward goals Pt Will Transfer to Toilet: Ambulation;Comfort height toilet;with supervision ADL Goal: Toilet Transfer - Progress: Progressing toward goals Additional ADL Goal #1: Pt will participate in 10 mins therapeutic activity with no more than 2 rests and dyspnea 2/4 ADL Goal: Additional Goal #1 - Progress: Progressing toward goals Miscellaneous OT Goals Miscellaneous OT Goal #1: Pt will demonstrate basic transfer with oxygen saturations >90 % to decr fall risk with adls OT Goal: Miscellaneous Goal #1 - Progress: Progressing toward goals  Visit Information  Last OT Received On: 05/25/13 Assistance Needed: +1    Subjective Data      Prior Functioning       Cognition  Cognition Arousal/Alertness: Awake/alert Behavior During Therapy: WFL for tasks assessed/performed Overall Cognitive Status: Impaired/Different from baseline Area of Impairment: Problem solving Problem Solving: Requires verbal cues;Slow processing General Comments: Appears to be improving. slow processing    Mobility  Bed Mobility Bed Mobility: Supine to Sit;Sitting - Scoot to Edge of Bed Supine to Sit: 6: Modified independent (Device/Increase time);HOB elevated Sitting - Scoot to Edge of Bed: 6: Modified independent (Device/Increase  time) Transfers Transfers: Sit to Stand;Stand to Sit Sit to Stand:  From chair/3-in-1;4: Min assist Stand to Sit: 4: Min assist;To chair/3-in-1;With upper extremity assist Details for Transfer Assistance: max vc for technique. Pt with improved mobility with useing momentum    Exercises  General Exercises - Upper Extremity Shoulder Flexion: AROM;Both;10 reps;Seated Shoulder ABduction: Strengthening;Both;10 reps;Seated Shoulder ADduction: Strengthening;Both;10 reps;Seated Elbow Flexion: AROM;Strengthening;Both;10 reps Elbow Extension: AROM;Strengthening;Both;10 reps   Balance Static Standing Balance Static Standing - Balance Support: Bilateral upper extremity supported Static Standing - Level of Assistance: 5: Stand by assistance   End of Session  in chair with call bell within reach. Wife present  GO     [name] [name],[name] [name] 05/25/2013, 11:35 AM [name], OTR/L  [phone] 05/25/2013

## 2013-05-25 NOTE — Progress Notes (Signed)
05/25/2013 0945 Pt. Stating that chest tightness is relieved at this time. Will continue to monitor. Anubis Fundora, Blanchard Kelch

## 2013-05-25 NOTE — Progress Notes (Signed)
[facility], OTR/L  [phone] 05/25/2013

## 2013-05-25 NOTE — Progress Notes (Addendum)
05/25/2013 0800 Pt. C/o chest tightness similar to that prior to thoracentesis last week. Denies SOB. EKG performed, no acute changes.  pt. On 2L o2 [place]. Dr. Shelle Iron paged and made aware. Orders received and enacted. Will continue to monitor patient.  Johnston Maddocks, Blanchard Kelch

## 2013-05-25 NOTE — Progress Notes (Signed)
05/25/2013 1:00 PM Nursing note PT. Cbg noted to be elevated at 442, Cbg rechecked and result 437. Stat lab verification obtained per protocol. Dr. Shelle Iron paged and made aware. Verbal orders received ok to administer scheduled 15 units of Novolog and 3 units of Novolog meal coverage at this time. MD to see patient. Will continue to closely monitor patient.

## 2013-05-25 NOTE — Clinical Social Work Note (Signed)
CSW sent updated clinicals to [facility] at [facility] via Providerlink.  [employer] is closed today in observance of the Memorial Day holiday.  Vickii Penna, LCSWA [phone]  Clinical Social Work

## 2013-05-25 NOTE — Progress Notes (Signed)
PT Cancellation Note  Patient Details Name: [name] [name] MRN: [id number] DOB: [birth year]   Cancelled Treatment:    Reason Eval/Treat Not Completed: Patient at procedure or test/unavailable.   Fabio Asa 05/25/2013, 10:05 AM

## 2013-05-25 NOTE — Progress Notes (Signed)
Physical Therapy Treatment Patient Details Name: OCTAVIUS SHIN MRN: [id number] DOB: 1936-11-06 Today's Date: 05/25/2013 Time: 6213-0865 PT Time Calculation (min): 25 min  PT Assessment / Plan / Recommendation Comments on Treatment Session  Pt with some modest improvements in activity tolerance today. Increased ambulation distance but patient became dyspnic with SpO2 dropping to 82% on 2 liters and HR increased into 140s.  Took 2 minutes for patient to rebound 02 levels, and approximately 6 minutes for HR to decrease. Will continue to work with patient and progress acitivty as tolerated.    Follow Up Recommendations  SNF (Daughter stated she was looking into [facility] for [facility])     Does the patient have the potential to tolerate intense rehabilitation     Barriers to Discharge        Equipment Recommendations   (dghtr states pt has a simple RW and a cruiser walker with se)    Recommendations for Other Services    Frequency Min 3X/week   Plan Discharge plan remains appropriate;Frequency remains appropriate    Precautions / Restrictions Precautions Precautions: Fall Precaution Comments: Hx COPD, watch O2 SATS Restrictions Weight Bearing Restrictions: No   Pertinent Vitals/Pain No pain; O2 range 96% at rest to 82% with ambulation; HR 140s with ambulation    Mobility  Bed Mobility Bed Mobility: Supine to Sit;Sitting - Scoot to Edge of Bed Supine to Sit: 6: Modified independent (Device/Increase time);HOB elevated Sitting - Scoot to Edge of Bed: 6: Modified independent (Device/Increase time) Transfers Transfers: Sit to Stand;Stand to Sit Sit to Stand: 4: Min assist;From chair/3-in-1 Stand to Sit: 4: Min assist;To chair/3-in-1;With upper extremity assist Details for Transfer Assistance: max vc for technique. Pt with improved mobility with useing momentum Ambulation/Gait Ambulation/Gait Assistance: 4: Min guard Ambulation Distance (Feet): 90 Feet Assistive device: Rolling  walker Ambulation/Gait Assistance Details: Pt with multiple rest breaks required secondary to increased HR 140s and decreased O2 80s on 2 liters. Continuous VCs for upright posture and position within rw. Gait Pattern: Step-through pattern;Decreased stride length;Trunk flexed Gait velocity: slow General Gait Details: no episodes of LOB, VCs for position with rw Stairs: No    Exercises General Exercises - Upper Extremity Shoulder Flexion: AROM;Both;10 reps;Seated Shoulder ABduction: Strengthening;Both;10 reps;Seated Shoulder ADduction: Strengthening;Both;10 reps;Seated Elbow Flexion: AROM;Strengthening;Both;10 reps Elbow Extension: AROM;Strengthening;Both;10 reps General Exercises - Lower Extremity Ankle Circles/Pumps: Strengthening;Both;10 reps Long Arc Quad: Strengthening;Both;10 reps Hip Flexion/Marching: Strengthening;10 reps;Seated;Both Toe Raises: AROM;Both;10 reps;Standing Heel Raises: AROM;Both;20 reps Mini-Sqauts: AROM;Both;10 reps;Standing     PT Goals Acute Rehab PT Goals PT Goal Formulation: With patient Time For Goal Achievement: 06/05/13 Potential to Achieve Goals: Good Pt will go Supine/Side to Sit: with modified independence;Other (comment) PT Goal: Supine/Side to Sit - Progress: Progressing toward goal Pt will go Sit to Supine/Side: with modified independence;with HOB 0 degrees Pt will Ambulate: >150 feet;with supervision;with least restrictive assistive device PT Goal: Ambulate - Progress: Progressing toward goal Pt will Go Up / Down Stairs: 3-5 stairs;with supervision;with least restrictive assistive device PT Goal: Up/Down Stairs - Progress: Not met Pt will Perform Home Exercise Program: Independently PT Goal: Perform Home Exercise Program - Progress: Progressing toward goal  Visit Information  Last PT Received On: 05/25/13 Assistance Needed: +1 Reason Eval/Treat Not Completed: Patient at procedure or test/unavailable    Subjective Data  Subjective: "Is  this going to get better?" Patient Stated Goal: to feel better   Cognition  Cognition Arousal/Alertness: Awake/alert Behavior During Therapy: WFL for tasks assessed/performed Overall Cognitive Status: Impaired/Different  from baseline Area of Impairment: Problem solving Problem Solving: Requires verbal cues;Slow processing General Comments: Appears to be improving. slow processing    Balance  Static Sitting Balance Static Sitting - Balance Support: No upper extremity supported;Feet supported Static Sitting - Level of Assistance: 6: Modified independent (Device/Increase time) Static Sitting - Comment/# of Minutes: 3 minutes; 2 minutes; 6 minutes Static Standing Balance Static Standing - Balance Support: Bilateral upper extremity supported Static Standing - Level of Assistance: 5: Stand by assistance  End of Session PT - End of Session Equipment Utilized During Treatment: Gait belt;Oxygen Activity Tolerance: Patient limited by fatigue Patient left: in chair;with call bell/phone within reach;with family/visitor present Nurse Communication: Mobility status   GP     Fabio Asa 05/25/2013, 11:50 AM Charlotte Crumb, PT DPT  [phone]

## 2013-05-25 NOTE — Progress Notes (Signed)
PULMONARY  / CRITICAL CARE MEDICINE  Name: [name] [name] MRN: [id number] DOB: 01-01-1936    ADMISSION DATE:  05/04/2013 CONSULTATION DATE:  05/04/2013  REFERRING MD :  Verlon Au PRIMARY SERVICE:  Hospitalist  CHIEF COMPLAINT:  hemoptysis  BRIEF PATIENT DESCRIPTION: 77 yo WM with hx of severe COPD, CHF (LVEF 55% to 60% on 4/21), and atrial flutter with recent hospitalization from 4/21-4/30 for SBo treated conservatively. Was on Xarelto and presented with hemoptysis and pulmonary infiltrates initially attributed to HCAP.   SIGNIFICANT EVENTS / STUDIES:  5/05 - admitted with hemoptysis  5/05 - 5/7 persistent hemoptysis 5/07- CT chest: dense LLL consolidation, no evidence of tumor or mass 5/08 - early AM - transferred to SDU with increased SOB 5/08 - No further hemoptysis 5/09 - increased dyspnea and worsening hypoxemia 5/13  -Increasing O2 reqt's. Increased infiltrates. Increased WBC count. Elective intubation for FOB 5/13 - One unit PRBCs for tachycardia, hypotension, Hgb 7.1 5/13 - FOB: normal airway exam. No evidence of active bleeding. Minimal mucoid secretions. Cytology >> NO malignant cells identified 5/13 - Hypotension due to sedatives, mech vent - vasopressors initiated 5/14 - Vasculitis serologies ordered: ESR 44, ANA neg, GBM Ab neg, ANCA neg 5/14 - Passed SBT. Extubated to NRB, PRN BiPAP 5/15 - One unit PRBCs for Hgb 6.5 5/16 - Transfer to Tele 5/20 - Daughter agreeable for d/c to SNF Rehab at [facility], patient agrees and feels he needs rehab efforts 5/23 - L Thoracentesis with 1044ml of rusty pleural fluid removed   LINES / TUBES: ETT 5/13 >> 5/14 L IJ CVL 5/13 >> out R radial A line 5/13 >> 5/15  CULTURES: MRSA PCR 5/5 >> NEG Blood 5/5 >> NEG Resp 5/6 >> mod candida Strep Ag 5/6 >> NEG Legionella 5/6 >> NEG Resp 5/10 >> Mod Candida BAL bacterial cx 5/13 >> NEG BAL Pneumocystis >> NEG Urine 5/14 >> NEG BAL Fungal 5/13 >> smear negative >>  BAL Legionella 513>>neg BAL  AFB 5/13 >>neg on prelim>>> Blood 5/14 >>neg  FOB cytology 5/13>>>NEG  ANTIBIOTICS: Cefepime 5/5 >>>5/5 Vanco 5/5 >> 5/9 Zoysn 5/5 >> 5/12 cipro 5/9 >> 5/12 Levofloxacin 5/12 >> 5/18 Fluconazole 5/12 >> 5/20 (stop date ordered)   SUBJECTIVE:  Had episode of chest tightness this am that resolved spontaneously.  No acute changes on EKG, and f/u cxr shows no recurrence of his pleural effusion.  Currently is very comfortable in chair with no increased wob on nasal oxygen.  sats excellent.  VITAL SIGNS: Temp:  [97.3 F (36.3 C)-98.1 F (36.7 C)] 97.3 F (36.3 C) (05/26 0348) Pulse Rate:  [73-74] 74 (05/26 0935) Resp:  [18-20] 18 (05/26 0348) BP: (107-117)/(53-57) 117/53 mmHg (05/26 0935) SpO2:  [91 %-96 %] 96 % (05/26 0935) FiO2 (%):  [28 %] 28 % (05/26 0154) Weight:  [71.9 kg (158 lb 8.2 oz)] 71.9 kg (158 lb 8.2 oz) (05/26 0129)   PHYSICAL EXAMINATION: General:  Elderly male, NAD Neuro:  Alert and oriented, moves all 4.  HEENT: nose without purulence or d/c, op clear Neck without LN or TMG Cardiovascular: irreg with CVR Lungs: decreased bs both bases with mild basilar crackles.  No wheezing or rhonchi Abdomen:  Soft, nontender, NABS Ext: No clubbing, mild edema BLE    Recent Labs Lab 05/21/13 0535 05/23/13 0525 05/25/13 0340 05/25/13 1210  NA 138 137 133*  --   K 3.9 4.0 4.7  --   CL 93* 92* 93*  --   CO2 38* 38* 37*  --  BUN 41* 33* 39*  --   CREATININE 0.93 1.01 1.04  --   GLUCOSE 56* 66* 130* 479*    Recent Labs Lab 05/22/13 0420 05/23/13 0525 05/25/13 0340  HGB 8.4* 8.5* 8.1*  HCT 27.0* 27.5* 25.4*  WBC 15.6* 13.0* 11.9*  PLT 174 163 159   Dg Chest 2 View  05/25/2013   *RADIOLOGY REPORT*  Clinical Data: Chest tightness, cough, congestion, history of COPD and CHF  CHEST - 2 VIEW  Comparison: 05/23/2013; 05/20/2013; chest CT - 05/21/2013  Findings: Grossly unchanged enlarged cardiac silhouette post median sternotomy and aortic valve replacement. The  lungs remain hyperexpanded with mild diffuse thickening of the interstitium.  No significant change in bilateral mid and left lower lung coarse reticular opacities.  Unchanged consolidative left basilar left retrocardiac opacity.  No new focal airspace opacities.  No definite pleural effusion or pneumothorax.  Grossly unchanged bones including stigmata of DISH within the thoracic spine.  IMPRESSION: Grossly unchanged findings suggestive of multifocal infection superimposed on advanced emphysematous change.  A follow-up chest radiograph in 4 to 6 weeks after treatment is recommended to ensure resolution.   Original Report Authenticated By: Jake Seats, MD    ASSESSMENT / PLAN:  PULMONARY A:  Acute on chronic respiratory failure Hemoptysis - mild recurrence 5/16- neg bronch w/o bleeding site found; now w epistaxis, ? Contribution.  No recent hemoptysis per pt today.  Severe COPD - increased bronchospasm 5/21.  On prednisone taper Pulmonary infiltrates of unclear etiology - bilat L>R opacities - improving. 5/22 repeat CT with LLL consolidation, RUL airspace disease Mediastinal LAN - likely reactive, but concerning for possible malignancy.  Left Pleural Effusion - s/p thora on 5/23 with 1037ml rusty fluid removed.  Studies reveal exudate, lymphocyte predominant cellularity, cytology pending.   P: -taper steroids as tolerated.  No bronchospasm on exam. -Pulm hygiene -Cont diuresis as tolerated.  Cardiology is following -Follow pleural cytology  -probably ok to restart anticoagulation?  CARDIOLOGY A: A fib/flutter, better rate control s/p bioprosthetic Aortic Valve replacement with MAZE (Aug 2011, normal coronaries on cath) Elevated BNP P: -[place] cards following  -Cont aggressive diuresis as tol per Cardiology - Suspect he is approaching dry weight with a bump in BUN.  Will check bmet again in am. -Ok from pulm standpoint to restart amiodarone if it is felt he needs to go home on this (may  not be necessary)  RENAL A:  Acute on chronic renal insuff, improved 5/21 Active urinary sediment P: -Monitor BMET -Vasculitis serologies negative  ENDOCRINE A:  DM2 - note A1c was 5.8 in April. Hyperglycemia P: -will increase orals and also lantus at bedtime.  Watch carefully with tapering steroid dose.

## 2013-05-26 ENCOUNTER — Inpatient Hospital Stay
Admission: RE | Admit: 2013-05-26 | Discharge: 2013-06-07 | Payer: Medicare Other | Source: Ambulatory Visit | Attending: Pulmonary Disease | Admitting: Pulmonary Disease

## 2013-05-26 LAB — BASIC METABOLIC PANEL
BUN: 40 mg/dL — ABNORMAL HIGH (ref 6–23)
CO2: 32 mEq/L (ref 19–32)
Chloride: 96 mEq/L (ref 96–112)
Creatinine, Ser: 1.09 mg/dL (ref 0.50–1.35)
GFR calc Af Amer: 75 mL/min — ABNORMAL LOW (ref >90)
Glucose, Bld: 66 mg/dL — ABNORMAL LOW (ref 70–99)
Potassium: 4.4 mEq/L (ref 3.5–5.1)

## 2013-05-26 LAB — GLUCOSE, CAPILLARY
Glucose-Capillary: 90 mg/dL (ref 70–99)
Glucose-Capillary: 69 mg/dL — ABNORMAL LOW (ref 70–99)

## 2013-05-26 MED ORDER — INSULIN ASPART 100 UNIT/ML ~~LOC~~ SOLN: 3.0000 [IU] | Freq: Three times a day (TID) | SUBCUTANEOUS | Status: DC

## 2013-05-26 MED ORDER — INSULIN ASPART 100 UNIT/ML ~~LOC~~ SOLN: 0.0000 [IU] | Freq: Three times a day (TID) | SUBCUTANEOUS | Status: DC

## 2013-05-26 MED ORDER — INSULIN GLARGINE 100 UNIT/ML ~~LOC~~ SOLN
10.0000 [IU] | SUBCUTANEOUS | Status: DC
  Filled 2013-05-26: qty 0.1

## 2013-05-26 MED ORDER — GLYBURIDE 5 MG PO TABS: 5.0000 mg | ORAL_TABLET | Freq: Two times a day (BID) | ORAL | Status: DC

## 2013-05-26 MED ORDER — SALINE SPRAY 0.65 % NA SOLN: 1.0000 | NASAL | Status: AC | PRN

## 2013-05-26 MED ORDER — FLUTICASONE PROPIONATE HFA 110 MCG/ACT IN AERO
2.0000 | INHALATION_SPRAY | Freq: Two times a day (BID) | RESPIRATORY_TRACT | Status: DC
  Filled 2013-05-26: qty 12

## 2013-05-26 MED ORDER — INSULIN GLARGINE 100 UNIT/ML ~~LOC~~ SOLN: 10.0000 [IU] | SUBCUTANEOUS | Status: DC

## 2013-05-26 NOTE — Progress Notes (Signed)
PULMONARY  / CRITICAL CARE MEDICINE  Name: [name] [name] MRN: [id number] DOB: Jul 11, 1936    ADMISSION DATE:  05/04/2013 CONSULTATION DATE:  05/04/2013  REFERRING MD :  Verlon Au PRIMARY SERVICE:  Hospitalist  CHIEF COMPLAINT:  hemoptysis  BRIEF PATIENT DESCRIPTION: 77 yo WM with hx of severe COPD, CHF (LVEF 55% to 60% on 4/21), and atrial flutter with recent hospitalization from 4/21-4/30 for SBo treated conservatively. Was on Xarelto and presented with hemoptysis and pulmonary infiltrates initially attributed to HCAP.   SIGNIFICANT EVENTS / STUDIES:  5/05 - admitted with hemoptysis  5/05 - 5/7 persistent hemoptysis 5/07- CT chest: dense LLL consolidation, no evidence of tumor or mass 5/08 - early AM - transferred to SDU with increased SOB 5/08 - No further hemoptysis 5/09 - increased dyspnea and worsening hypoxemia 5/13  -Increasing O2 reqt's. Increased infiltrates. Increased WBC count. Elective intubation for FOB 5/13 - One unit PRBCs for tachycardia, hypotension, Hgb 7.1 5/13 - FOB: normal airway exam. No evidence of active bleeding. Minimal mucoid secretions. Cytology >> NO malignant cells identified 5/13 - Hypotension due to sedatives, mech vent - vasopressors initiated 5/14 - Vasculitis serologies ordered: ESR 44, ANA neg, GBM Ab neg, ANCA neg 5/14 - Passed SBT. Extubated to NRB, PRN BiPAP 5/15 - One unit PRBCs for Hgb 6.5 5/16 - Transfer to Tele 5/20 - Daughter agreeable for d/c to SNF Rehab at [facility], patient agrees and feels he needs rehab efforts 5/23 - L Thoracentesis with 1070ml of rusty pleural fluid removed   LINES / TUBES: ETT 5/13 >> 5/14 L IJ CVL 5/13 >> out R radial A line 5/13 >> 5/15  CULTURES: MRSA PCR 5/5 >> NEG Blood 5/5 >> NEG Resp 5/6 >> mod candida Strep Ag 5/6 >> NEG Legionella 5/6 >> NEG Resp 5/10 >> Mod Candida BAL bacterial cx 5/13 >> NEG BAL Pneumocystis >> NEG Urine 5/14 >> NEG BAL Fungal 5/13 >> smear negative >>  BAL Legionella 513>>neg BAL  AFB 5/13 >>neg on prelim>>> Blood 5/14 >>neg  FOB cytology 5/13>>>NEG  ANTIBIOTICS: Cefepime 5/5 >>>5/5 Vanco 5/5 >> 5/9 Zoysn 5/5 >> 5/12 cipro 5/9 >> 5/12 Levofloxacin 5/12 >> 5/18 Fluconazole 5/12 >> 5/20 (stop date ordered)   SUBJECTIVE:  No interval issues. Wet sounding cough .  Waiting on SNF rehab insurance approval  VITAL SIGNS: Temp:  [97.9 F (36.6 C)-98.3 F (36.8 C)] 98.3 F (36.8 C) (05/27 0506) Pulse Rate:  [71-74] 74 (05/27 0506) Resp:  [18-20] 18 (05/27 0506) BP: (104-114)/(46-53) 105/53 mmHg (05/27 1016) SpO2:  [90 %-96 %] 96 % (05/27 0750) FiO2 (%):  [28 %] 28 % (05/27 0213) Weight:  [74.1 kg (163 lb 5.8 oz)] 74.1 kg (163 lb 5.8 oz) (05/27 0506)   PHYSICAL EXAMINATION: General:  Elderly male, NAD Neuro:  Alert and oriented, moves all 4.  HEENT: nose without purulence or d/c, op clear Neck without LN or TMG Cardiovascular: irreg with CVR Lungs: decreased bs both bases with mild basilar crackles.  Scattered rhonchi with cough  Abdomen:  Soft, nontender, NABS Ext: No clubbing, mild edema BLE    Recent Labs Lab 05/23/13 0525 05/25/13 0340 05/25/13 1210 05/26/13 0518  NA 137 133*  --  136  K 4.0 4.7  --  4.4  CL 92* 93*  --  96  CO2 38* 37*  --  32  BUN 33* 39*  --  40*  CREATININE 1.01 1.04  --  1.09  GLUCOSE 66* 130* 479* 66*  Recent Labs Lab 05/22/13 0420 05/23/13 0525 05/25/13 0340  HGB 8.4* 8.5* 8.1*  HCT 27.0* 27.5* 25.4*  WBC 15.6* 13.0* 11.9*  PLT 174 163 159   Dg Chest 2 View  05/25/2013   *RADIOLOGY REPORT*  Clinical Data: Chest tightness, cough, congestion, history of COPD and CHF  CHEST - 2 VIEW  Comparison: 05/23/2013; 05/20/2013; chest CT - 05/21/2013  Findings: Grossly unchanged enlarged cardiac silhouette post median sternotomy and aortic valve replacement. The lungs remain hyperexpanded with mild diffuse thickening of the interstitium.  No significant change in bilateral mid and left lower lung coarse reticular  opacities.  Unchanged consolidative left basilar left retrocardiac opacity.  No new focal airspace opacities.  No definite pleural effusion or pneumothorax.  Grossly unchanged bones including stigmata of DISH within the thoracic spine.  IMPRESSION: Grossly unchanged findings suggestive of multifocal infection superimposed on advanced emphysematous change.  A follow-up chest radiograph in 4 to 6 weeks after treatment is recommended to ensure resolution.   Original Report Authenticated By: Jake Seats, MD    ASSESSMENT / PLAN:  PULMONARY A:  Acute on chronic respiratory failure Hemoptysis - mild recurrence 5/16- neg bronch w/o bleeding site found; now w epistaxis, ? Contribution.  No recent hemoptysis per pt today.  Severe COPD - increased bronchospasm 5/21.  On prednisone taper Pulmonary infiltrates of unclear etiology - bilat L>R opacities - improving. 5/22 repeat CT with LLL consolidation, RUL airspace disease Mediastinal LAN - likely reactive, but concerning for possible malignancy.  Left Pleural Effusion - s/p thora on 5/23 with 102ml rusty fluid removed.  Studies reveal exudate, lymphocyte predominant cellularity, cytology pending.   P: -start ICS as off pred now -Pulm hygiene -Cont diuresis as tolerated.  Cardiology is following -I would avoid further anticoagulation, pt high fall risk and high risk rebleed in lungs. ASA 81mg  is OK, have to ACCEPT CVA RISK  CARDIOLOGY A: A fib/flutter, better rate control s/p bioprosthetic Aortic Valve replacement with MAZE (Aug 2011, normal coronaries on cath) Elevated BNP P: -[employer] cards following  -diuresis per cards -Ok from pulm standpoint to restart amiodarone if it is felt he needs to go home on this (may not be necessary)  RENAL A:  Acute on chronic renal insuff, improved 5/21 Active urinary sediment P: -Monitor BMET -Vasculitis serologies negative  ENDOCRINE A:  DM2 - note A1c was 5.8 in April. Hyperglycemia P: -maintain   orals and also lantus at bedtime.   Waiting on insurance approval for snf rehab.    [name]  Beeper  [phone]  Cell  [phone]  If no response or cell goes to voicemail, call beeper [phone]

## 2013-05-26 NOTE — Progress Notes (Signed)
Occupational Therapy Treatment Patient Details Name: [name] [name] MRN: [id number] DOB: November 06, 1936 Today's Date: 05/26/2013 Time: 4098-1191 OT Time Calculation (min): 32 min  OT Assessment / Plan / Recommendation Comments on Treatment Session Making steady progress. Improvement noted with O2 recovery today. Ambulated on 3L. Desat to 88 after 25'. Rebound < 1 min >90. Pt also completing functional ADL tasks with desat to 88. Again, rebound <1 min to >90. Dyspnea 3/4 at times. Excellent participation. Pt given written theraband ex to complette independtly with wife, who was very appreciative of care.    Follow Up Recommendations  Supervision/Assistance - 24 hour;SNF    Barriers to Discharge       Equipment Recommendations  3 in 1 bedside comode;Tub/shower seat    Recommendations for Other Services    Frequency Min 2X/week   Plan Discharge plan remains appropriate    Precautions / Restrictions Precautions Precautions: Fall Precaution Comments: Hx COPD, watch O2 SATS   Pertinent Vitals/Pain O2 rest 2L 97. O2 88 3L after ambulating 25'. HR 140s. Rebound , 1 min.     ADL  Grooming: Supervision/safety;Set up Where Assessed - Grooming: Unsupported standing Toilet Transfer: Min guard;Performed Toilet Transfer Method: Other (comment) (ambulating to toilet. Standing to urinate) Toileting - Clothing Manipulation and Hygiene: Supervision/safety Where Assessed - Toileting Clothing Manipulation and Hygiene: Standing Equipment Used: Gait belt;Rolling walker Transfers/Ambulation Related to ADLs: minguard/S RW level ADL Comments: Improving endurance for ADL. using pursed lip breathing with min vc. Wife present for education    OT Diagnosis:    OT Problem List:   OT Treatment Interventions:     OT Goals Acute Rehab OT Goals OT Goal Formulation: With patient/family Time For Goal Achievement: 06/03/13 Potential to Achieve Goals: Good ADL Goals Pt Will Perform Grooming: Sitting at  sink;Standing at sink;Other (comment);with supervision;with set-up ADL Goal: Grooming - Progress: Met Pt Will Perform Upper Body Bathing: Sit to stand from chair;Sitting at sink;with set-up;with supervision ADL Goal: Upper Body Bathing - Progress: Progressing toward goals Pt Will Perform Lower Body Bathing: with min assist;Sit to stand from chair;Sit to stand from bed ADL Goal: Lower Body Bathing - Progress: Progressing toward goals Pt Will Perform Upper Body Dressing: Sit to stand from chair;with set-up;with supervision ADL Goal: Upper Body Dressing - Progress: Progressing toward goals Pt Will Perform Lower Body Dressing: with min assist;Sit to stand from chair;Sit to stand from bed ADL Goal: Lower Body Dressing - Progress: Progressing toward goals Pt Will Transfer to Toilet: Ambulation;Comfort height toilet;with supervision ADL Goal: Toilet Transfer - Progress: Progressing toward goals Additional ADL Goal #1: Pt will participate in 10 mins therapeutic activity with no more than 2 rests and dyspnea 2/4 ADL Goal: Additional Goal #1 - Progress: Met Miscellaneous OT Goals Miscellaneous OT Goal #1: Pt will demonstrate basic transfer with oxygen saturations >90 % to decr fall risk with adls OT Goal: Miscellaneous Goal #1 - Progress: Met  Visit Information  Last OT Received On: 05/26/13 Assistance Needed: +1    Subjective Data      Prior Functioning       Cognition  Cognition Arousal/Alertness: Awake/alert Behavior During Therapy: WFL for tasks assessed/performed Overall Cognitive Status: Impaired/Different from baseline Area of Impairment: Problem solving Problem Solving: Requires verbal cues;Slow processing General Comments: Appears to be improving. slow processing    Mobility  Transfers Transfers: Sit to Stand;Stand to Sit Sit to Stand: 4: Min guard;With upper extremity assist;From chair/3-in-1 Stand to Sit: 4: Min guard;With upper extremity assist;To chair/3-in-1 Details  for  Transfer Assistance: improved carry over from yesterday's session    Exercises  General Exercises - Upper Extremity Shoulder Flexion: Both;10 reps;Seated;Theraband Theraband Level (Shoulder Flexion): Level 1 (Yellow) Shoulder Extension: Strengthening;Both;10 reps;Seated;Theraband Theraband Level (Shoulder Extension): Level 1 (Yellow) Shoulder ABduction: Strengthening;Both;10 reps;Seated;Theraband Theraband Level (Shoulder Abduction): Level 1 (Yellow) Elbow Flexion: Strengthening;Both;10 reps;Seated Elbow Extension: Theraband Theraband Level (Elbow Extension): Level 1 (Yellow) Wrist Flexion: Strengthening;Both;10 reps;Seated Wrist Extension: Theraband Theraband Level (Wrist Extension): Level 1 (Yellow)   Balance     End of Session OT - End of Session Equipment Utilized During Treatment: Gait belt Activity Tolerance: Patient tolerated treatment well Patient left: in chair;with call bell/phone within reach;with family/visitor present Nurse Communication: Mobility status  GO     Raeshawn Tafolla,[name] 05/26/2013, 12:34 PM [facility], OTR/L  [phone] 05/26/2013

## 2013-05-26 NOTE — Progress Notes (Signed)
Physical Therapy Treatment Patient Details Name: [name] [name] MRN: [id number] DOB: 1936-12-27 Today's Date: 05/26/2013 Time: 8295-6213 PT Time Calculation (min): 16 min  PT Assessment / Plan / Recommendation Comments on Treatment Session  Pt preparing for d/c to SNF; desired to ambulate again for transferring to SNF. Patient demonstrates improvements in ambulation and activity tolerance. HR stable throughout. Pt and family very pleased with efforts from therapy staff throughout stay.    Follow Up Recommendations  SNF (Daughter stated she was looking into [facility] for [facility])           Equipment Recommendations   (dghtr states pt has a simple RW and a cruiser walker with se)    Recommendations for Other Services    Frequency Min 3X/week   Plan Discharge plan remains appropriate;Frequency remains appropriate    Precautions / Restrictions Precautions Precautions: Fall Precaution Comments: Hx COPD, watch O2 SATS   Pertinent Vitals/Pain NAD   Mobility  Bed Mobility Bed Mobility: Supine to Sit;Sitting - Scoot to Edge of Bed Supine to Sit: 6: Modified independent (Device/Increase time);HOB elevated Sitting - Scoot to Edge of Bed: 6: Modified independent (Device/Increase time) Transfers Transfers: Sit to Stand;Stand to Sit Sit to Stand: 4: Min guard;From bed Stand to Sit: 4: Min guard;With upper extremity assist;To bed Details for Transfer Assistance: VCs for hand placement Ambulation/Gait Ambulation/Gait Assistance: 4: Min guard Ambulation Distance (Feet): 120 Feet Assistive device: Rolling walker Ambulation/Gait Assistance Details: HR stable; 2 rest breaks to increase O2 saturation Gait Pattern: Step-through pattern;Decreased stride length;Trunk flexed Gait velocity: slow General Gait Details: no episodes of LOB, VCs for position with rw Stairs: No    Exercises General Exercises - Upper Extremity Shoulder Flexion: Both;10 reps;Seated;Theraband Theraband Level  (Shoulder Flexion): Level 1 (Yellow) Shoulder Extension: Strengthening;Both;10 reps;Seated;Theraband Theraband Level (Shoulder Extension): Level 1 (Yellow) Shoulder ABduction: Strengthening;Both;10 reps;Seated;Theraband Theraband Level (Shoulder Abduction): Level 1 (Yellow) Elbow Flexion: Strengthening;Both;10 reps;Seated Elbow Extension: Theraband Theraband Level (Elbow Extension): Level 1 (Yellow) Wrist Flexion: Strengthening;Both;10 reps;Seated Wrist Extension: Theraband Theraband Level (Wrist Extension): Level 1 (Yellow)   PT Diagnosis:    PT Problem List:   PT Treatment Interventions:     PT Goals Acute Rehab PT Goals PT Goal Formulation: With patient Time For Goal Achievement: 06/05/13 Potential to Achieve Goals: Good Pt will go Supine/Side to Sit: with modified independence;Other (comment) PT Goal: Supine/Side to Sit - Progress: Progressing toward goal Pt will go Sit to Supine/Side: with modified independence;with HOB 0 degrees PT Goal: Sit to Supine/Side - Progress: Progressing toward goal Pt will Ambulate: >150 feet;with supervision;with least restrictive assistive device PT Goal: Ambulate - Progress: Progressing toward goal Pt will Go Up / Down Stairs: 3-5 stairs;with supervision;with least restrictive assistive device Pt will Perform Home Exercise Program: Independently  Visit Information  Last PT Received On: 05/26/13 Assistance Needed: +1    Subjective Data  Subjective: This is our victory lap Patient Stated Goal: to feel better   Cognition  Cognition Arousal/Alertness: Awake/alert Behavior During Therapy: WFL for tasks assessed/performed Overall Cognitive Status: Impaired/Different from baseline Area of Impairment: Problem solving Problem Solving: Requires verbal cues;Slow processing General Comments: Appears to be improving. slow processing    Balance  Static Standing Balance Static Standing - Balance Support: Bilateral upper extremity supported Static  Standing - Level of Assistance: 5: Stand by assistance  End of Session PT - End of Session Equipment Utilized During Treatment: Gait belt;Oxygen Activity Tolerance: Patient limited by fatigue Patient left: in bed;with call bell/phone within  reach;with family/visitor present Nurse Communication: Mobility status   GP     Fabio Asa 05/26/2013, 3:30 PM

## 2013-05-26 NOTE — Clinical Social Work Note (Addendum)
Clinical Social Worker was informed that patient is planned for discharge today. CSW received [facility] authorization: [id number]. CSW notified [facility]. CSW is awaiting discharge summary to facilitate discharge today.   13:38pm Clinical Social Worker facilitated discharge by contacting family, who is at bedside and facility, [facility] regarding discharge today. CSW will complete discharge packet and patient will be transported via ambulance.   Rozetta Nunnery MSW, [employer] [phone]

## 2013-05-26 NOTE — Progress Notes (Signed)
Hypoglycemic Event  CBG: 69  Treatment: 15 grams carbohydrates  Symptoms: none  Follow-up CBG: Time: 0620 CBG Result: 90  Possible Reasons for Event: medications  Comments/MD notified: no    Kaylyn Lim, Joyice Faster  Remember to initiate Hypoglycemia Order Set & complete

## 2013-05-26 NOTE — Progress Notes (Signed)
05/26/2013 3:16 PM Nursing note Report called to [facility]. Questions and concerns addressed. Discharge AVS form reviewed with family and patient. New medications, discontinued medications and those due this evening reviewed. D/c iv line and tele by NT. D/c to [facility] via EMS transport per orders.  Jhade Berko, Blanchard Kelch

## 2013-05-26 NOTE — Progress Notes (Signed)
TELEMETRY: Reviewed telemetry pt in atrial flutter with controlled rate: Filed Vitals:   05/25/13 1926 05/25/13 2026 05/26/13 0213 05/26/13 0506  BP:  114/46  104/46  Pulse:  71  74  Temp:  97.9 F (36.6 C)  98.3 F (36.8 C)  TempSrc:  Oral  Oral  Resp:  20  18  Height:      Weight:    163 lb 5.8 oz (74.1 kg)  SpO2: 90% 96% 95% 94%    Intake/Output Summary (Last 24 hours) at 05/26/13 0750 Last data filed at 05/26/13 0500  Gross per 24 hour  Intake    980 ml  Output   1300 ml  Net   -320 ml    SUBJECTIVE Feeling better. Less SOB. Nonproductive cough with hemoptysis.  LABS: Basic Metabolic Panel:  Recent Labs  16/10/96 0340 05/25/13 1210 05/26/13 0518  NA 133*  --  136  K 4.7  --  4.4  CL 93*  --  96  CO2 37*  --  32  GLUCOSE 130* 479* 66*  BUN 39*  --  40*  CREATININE 1.04  --  1.09  CALCIUM 8.2*  --  8.3*   CBC:  Recent Labs  05/25/13 0340  WBC 11.9*  HGB 8.1*  HCT 25.4*  MCV 94.8  PLT 159    Radiology/Studies:  Dg Chest 2 View  05/25/2013   *RADIOLOGY REPORT*  Clinical Data: Chest tightness, cough, congestion, history of COPD and CHF  CHEST - 2 VIEW  Comparison: 05/23/2013; 05/20/2013; chest CT - 05/21/2013  Findings: Grossly unchanged enlarged cardiac silhouette post median sternotomy and aortic valve replacement. The lungs remain hyperexpanded with mild diffuse thickening of the interstitium.  No significant change in bilateral mid and left lower lung coarse reticular opacities.  Unchanged consolidative left basilar left retrocardiac opacity.  No new focal airspace opacities.  No definite pleural effusion or pneumothorax.  Grossly unchanged bones including stigmata of DISH within the thoracic spine.  IMPRESSION: Grossly unchanged findings suggestive of multifocal infection superimposed on advanced emphysematous change.  A follow-up chest radiograph in 4 to 6 weeks after treatment is recommended to ensure resolution.   Original Report Authenticated By:  Tacey Ruiz, MD   Dg Chest 2 View  05/22/2013   *RADIOLOGY REPORT*  Clinical Data: Follow-up edema.  Question left effusion.  CHEST - 2 VIEW  Comparison: CT chest 05/21/2013 and chest radiograph 05/20/2013.  Findings: Trachea is midline.  Heart is at the upper limits of normal in size.  There is bibasilar dependent air space disease superimposed on emphysema.  Moderate left pleural effusion.  Tiny right pleural effusion.  Flowing anterior osteophytosis is seen in the thoracic spine.  IMPRESSION:  1.  Bibasilar air space disease, as before, indicative of multilobar pneumonia.  Edema not excluded. 2.  Bilateral pleural effusions, left greater than right.   Original Report Authenticated By: Leanna Battles, M.D.    Ct Chest Wo Contrast  05/21/2013   *RADIOLOGY REPORT*  Clinical Data: Evaluate pulmonary infiltrates and mediastinal lymphadenopathy.  Persistent nonproductive cough.  CT CHEST WITHOUT CONTRAST  Technique:  Multidetector CT imaging of the chest was performed following the standard protocol without IV contrast.  Comparison: Chest CT 05/06/2013.  Findings:  Mediastinum: Heart size is enlarged with dilatation of the right atrium.  There is no significant pericardial fluid, thickening or pericardial calcification. There is atherosclerosis of the thoracic aorta, the great vessels of the mediastinum and the coronary arteries, including calcified atherosclerotic  plaque in the left main, left anterior descending, left circumflex and right coronary arteries. Status post median sternotomy for aortic valve replacement (there appears to be a stented bioprosthesis in position).  Ectasia of the proximal descending thoracic aorta and near the anastomotic site of the bioprosthetic valve measuring up to approximately 4.1 cm in diameter, similar to prior studies. No pathologically enlarged mediastinal or hilar lymph nodes. Please note that accurate exclusion of hilar adenopathy is limited on noncontrast CT scans.   Numerous borderline enlarged mediastinal lymph nodes are presumably reactive, including a cluster of subcarinal nodes which appear matted together.  Esophagus is unremarkable in appearance.  Lungs/Pleura: There continues to be extensive confluent air space consolidation and numerous air bronchograms throughout the left lower lobe, compatible with persistent left lower lobe pneumonia. There is now an enlarging moderate left-sided parapneumonic pleural effusion which appears to layer dependently.  Scattered throughout the lungs bilaterally there are increasing areas of patchy ground glass attenuation and septal thickening, concerning for endobronchial spread of infection, most severe in the right lower lobe posteriorly and in the right middle lobe.  The background of moderate centrilobular emphysema and mild diffuse bronchial wall thickening is similar to the prior study.  Frothy secretions are noted in the distal trachea and proximal right main stem bronchi bilaterally (left greater than right).  Upper Abdomen: Low-attenuation bilateral renal lesions similar to prior studies.  Status post cholecystectomy.  Musculoskeletal: There are no aggressive appearing lytic or blastic lesions noted in the visualized portions of the skeleton. Sternotomy wires.  IMPRESSION: 1.  Overall, there is worsening aeration throughout the lungs bilaterally.  Persistent air space consolidation in the left lower lobe is compatible with a residual lobar pneumonia.  There is evidence of endobronchial spread of infection throughout the remainder of the lungs, most severe in the right middle lobe and right lower lobe. 2.  Enlarging moderate-sized left-sided parapneumonic pleural effusion, which appears to layer dependently at this time. 3. Atherosclerosis, including left main and three-vessel coronary artery disease.   Assessment for potential risk factor modification, dietary therapy or pharmacologic therapy may be warranted, if clinically  indicated. 4.  Status post median sternotomy for a bioprosthetic aortic valve replacement. 5.  Mild ectasia of the descending thoracic aorta (4.1 cm in diameter) is unchanged. 6.  Additional incidental findings, similar to prior studies, as above.   Original Report Authenticated By: Trudie Reed, M.D.   PHYSICAL EXAM General: Well developed, elderly, in no acute distress. Head: Normal Neck: Negative for carotid bruits. JVD not elevated. Lungs: Mild basilar crackles. Breathing is unlabored. Heart: IRRR S1 S2 without murmurs, rubs, or gallops.  Abdomen: Soft, non-tender, non-distended with normoactive bowel sounds.  Msk:  Strength and tone appears normal for age. Extremities: No clubbing, cyanosis or edema.  Distal pedal pulses are 2+ and equal bilaterally. Neuro: Alert and oriented X 3. Moves all extremities spontaneously. Psych:  Responds to questions appropriately with a normal affect.  ASSESSMENT AND PLAN: 1. Acute on chronic diastolic CHF. Weight stable and appears euvolemic. Continue current lasix dose.   2. Atrial flutter. Rate is well controlled. Continue diltiazem. On ASA in lieu of anticoagulation due to gross hemoptysis.  3. PNA.  Plan: awaiting transfer to [facility].   Principal Problem:   Acute-on-chronic respiratory failure Active Problems:   Atrial flutter   COPD (chronic obstructive pulmonary disease)   CKD (chronic kidney disease) stage 3, GFR 30-59 ml/min   Type 2 diabetes mellitus, uncontrolled   HCAP (healthcare-associated pneumonia)  Hemoptysis   Pulmonary infiltrates   Acute on chronic diastolic CHF (congestive heart failure), NYHA class 1   Anemia, normocytic normochromic   Hypoalbuminemia    Signed, [name] [place] MD,FACC 05/26/2013 7:56 AM

## 2013-05-26 NOTE — Discharge Summary (Signed)
Physician Discharge Summary  Patient ID: [name] [name] MRN: [id number] DOB/AGE: 05/05/1936 77 y.o.  Admit date: 05/04/2013 Discharge date: 05/26/2013  Problem List Principal Problem:   Acute-on-chronic respiratory failure Active Problems:   Atrial flutter   COPD (chronic obstructive pulmonary disease)   CKD (chronic kidney disease) stage 3, GFR 30-59 ml/min   Type 2 diabetes mellitus, uncontrolled   HCAP (healthcare-associated pneumonia)   Hemoptysis   Pulmonary infiltrates   Acute on chronic diastolic CHF (congestive heart failure), NYHA class 1   Anemia, normocytic normochromic   Hypoalbuminemia  [name] [name] is a 77 y.o. male presented from [facility] ED with hemoptysis. He was hospitlized at [facility] from 4/21 to 4/30 with AECOPD and given IV Abx and Steroid sand was in Afib and then had an SBO as well non surgically managed  He was staretd on xarelto.  He states the hemoptysis started yesterday about 14:00 hours and was noted to be about 2-3 tsp of blood-It wasn't mixed with sputum it was "just blood". He initially though tit might have been 2/2 to the Ng tube that had been placed last week at [facility]-his wife reports some sputum and brownish mucous  He had coughing of blood about 4-5 times today-no pain with this. No vomiting of blood. No SOB associated with this more than usual. No stomach pan. No dark or tarry stool noted  No fever or chills or rigors.  No diarrhea    Hospital Course: SIGNIFICANT EVENTS / STUDIES:  5/05 - admitted with hemoptysis  5/05 - 5/7 persistent hemoptysis  5/07- CT chest: dense LLL consolidation, no evidence of tumor or mass  5/08 - early AM - transferred to SDU with increased SOB  5/08 - No further hemoptysis  5/09 - increased dyspnea and worsening hypoxemia  5/13 -Increasing O2 reqt's. Increased infiltrates. Increased WBC count. Elective intubation for FOB  5/13 - One unit PRBCs for tachycardia, hypotension, Hgb 7.1  5/13 - FOB: normal airway exam. No  evidence of active bleeding. Minimal mucoid secretions. Cytology >> NO malignant cells identified  5/13 - Hypotension due to sedatives, mech vent - vasopressors initiated  5/14 - Vasculitis serologies ordered: ESR 44, ANA neg, GBM Ab neg, ANCA neg  5/14 - Passed SBT. Extubated to NRB, PRN BiPAP  5/15 - One unit PRBCs for Hgb 6.5  5/16 - Transfer to Tele  5/20 - Daughter agreeable for d/c to SNF Rehab at [facility], patient agrees and feels he needs rehab efforts  5/23 - L Thoracentesis with 1071ml of rusty pleural fluid removed  LINES / TUBES:  ETT 5/13 >> 5/14  L IJ CVL 5/13 >> out  R radial A line 5/13 >> 5/15  CULTURES:  MRSA PCR 5/5 >> NEG  Blood 5/5 >> NEG  Resp 5/6 >> mod candida  Strep Ag 5/6 >> NEG  Legionella 5/6 >> NEG  Resp 5/10 >> Mod Candida  BAL bacterial cx 5/13 >> NEG  BAL Pneumocystis >> NEG  Urine 5/14 >> NEG  BAL Fungal 5/13 >> smear negative >>  BAL Legionella 513>>neg  BAL AFB 5/13 >>neg on prelim>>>  Blood 5/14 >>neg  FOB cytology 5/13>>>NEG  ANTIBIOTICS:  Cefepime 5/5 >>>5/5  Vanco 5/5 >> 5/9  Zoysn 5/5 >> 5/12  cipro 5/9 >> 5/12  Levofloxacin 5/12 >> 5/18  Fluconazole 5/12 >> 5/20 (stop date ordered)   ASSESSMENT / PLAN:  PULMONARY  A:  Acute on chronic respiratory failure  Hemoptysis - mild recurrence 5/16- neg bronch w/o  bleeding site found; now w epistaxis, ? Contribution. No recent hemoptysis per pt today.  Severe COPD - increased bronchospasm 5/21. On prednisone taper  Pulmonary infiltrates of unclear etiology - bilat L>R opacities - improving. 5/22 repeat CT with LLL consolidation, RUL airspace disease  Mediastinal LAN - likely reactive, but concerning for possible malignancy.  Left Pleural Effusion - s/p thora on 5/23 with 1029ml rusty fluid removed. Studies reveal exudate, lymphocyte predominant cellularity, cytology pending.  P:  -start ICS as off pred now  -Pulm hygiene  -Cont diuresis as tolerated. Cardiology is following  -I would avoid  further anticoagulation, pt high fall risk and high risk rebleed in lungs.  ASA 81mg  is OK, have to ACCEPT CVA RISK  CARDIOLOGY  A:  A fib/flutter, better rate control  s/p bioprosthetic Aortic Valve replacement with MAZE (Aug 2011, normal coronaries on cath)  Elevated BNP  P:  -[place] cards following  -diuresis per cards  -Ok from pulm standpoint to restart amiodarone if it is felt he needs to go home on this (may not be necessary)  RENAL  A:  Acute on chronic renal insuff, improved 5/21  Active urinary sediment  P:  -Monitor BMET  -Vasculitis serologies negative  ENDOCRINE A:  DM2 - note A1c was 5.8 in April.  Hyperglycemia  P:  -maintain orals and also lantus at bedtime      Labs at discharge Lab Results  Component Value Date   CREATININE 1.09 05/26/2013   BUN 40* 05/26/2013   NA 136 05/26/2013   K 4.4 05/26/2013   CL 96 05/26/2013   CO2 32 05/26/2013   Lab Results  Component Value Date   WBC 11.9* 05/25/2013   HGB 8.1* 05/25/2013   HCT 25.4* 05/25/2013   MCV 94.8 05/25/2013   PLT 159 05/25/2013   Lab Results  Component Value Date   ALT 16 05/09/2013   AST 14 05/09/2013   ALKPHOS 72 05/09/2013   BILITOT 1.8* 05/09/2013   Lab Results  Component Value Date   INR 1.31 05/16/2013   INR 2.3 03/23/2011   INR 1.59* 09/28/2010    Current radiology studies Dg Chest 2 View  05/25/2013   *RADIOLOGY REPORT*  Clinical Data: Chest tightness, cough, congestion, history of COPD and CHF  CHEST - 2 VIEW  Comparison: 05/23/2013; 05/20/2013; chest CT - 05/21/2013  Findings: Grossly unchanged enlarged cardiac silhouette post median sternotomy and aortic valve replacement. The lungs remain hyperexpanded with mild diffuse thickening of the interstitium.  No significant change in bilateral mid and left lower lung coarse reticular opacities.  Unchanged consolidative left basilar left retrocardiac opacity.  No new focal airspace opacities.  No definite pleural effusion or pneumothorax.   Grossly unchanged bones including stigmata of DISH within the thoracic spine.  IMPRESSION: Grossly unchanged findings suggestive of multifocal infection superimposed on advanced emphysematous change.  A follow-up chest radiograph in 4 to 6 weeks after treatment is recommended to ensure resolution.   Original Report Authenticated By: Jake Seats, MD    Disposition:  06-Home-Health Care Svc     Medication List    STOP taking these medications       allopurinol 300 MG tablet  Commonly known as:  ZYLOPRIM     fish oil-omega-3 fatty acids 1000 MG capsule     lisinopril 10 MG tablet  Commonly known as:  PRINIVIL,ZESTRIL     methylPREDNISolone 4 MG tablet  Commonly known as:  MEDROL (PAK)     neomycin-polymyxin-hydrocortisone otic  solution  Commonly known as:  CORTISPORIN     potassium chloride SA 20 MEQ tablet  Commonly known as:  K-DUR,KLOR-CON     Rivaroxaban 20 MG Tabs  Commonly known as:  XARELTO     vitamin C 1000 MG tablet      TAKE these medications       acetaminophen 500 MG tablet  Commonly known as:  TYLENOL  Take 1,000 mg by mouth every 6 (six) hours as needed. Pain     aspirin 81 MG tablet  Take 81 mg by mouth daily.     diltiazem 240 MG 24 hr capsule  Commonly known as:  DILACOR XR  Take 240 mg by mouth daily.     fluticasone 110 MCG/ACT inhaler  Commonly known as:  FLOVENT HFA  Inhale 1 puff into the lungs 2 (two) times daily.     furosemide 40 MG tablet  Commonly known as:  LASIX  Take 40 mg by mouth daily.     glyBURIDE 5 MG tablet  Commonly known as:  DIABETA  Take 1 tablet (5 mg total) by mouth 2 (two) times daily with a meal.     insulin aspart 100 UNIT/ML injection  Commonly known as:  novoLOG  Inject 0-15 Units into the skin 3 (three) times daily with meals.     insulin aspart 100 UNIT/ML injection  Commonly known as:  novoLOG  Inject 3 Units into the skin 3 (three) times daily with meals.     insulin glargine 100 UNIT/ML injection   Commonly known as:  LANTUS  Inject 0.1 mLs (10 Units total) into the skin daily.     ipratropium-albuterol 0.5-2.5 (3) MG/3ML Soln  Commonly known as:  DUONEB  Take 3 mLs by nebulization 4 (four) times daily.     sodium chloride 0.65 % Soln nasal spray  Commonly known as:  OCEAN  Place 1 spray into the nose as needed for congestion.          Discharged Condition: fair  Time spent on discharge greater than 60 minutes.  Vital signs at Discharge. Temp:  [97.9 F (36.6 C)-98.3 F (36.8 C)] 98.3 F (36.8 C) (05/27 0506) Pulse Rate:  [71-74] 74 (05/27 0506) Resp:  [18-20] 18 (05/27 0506) BP: (104-114)/(46-53) 105/53 mmHg (05/27 1016) SpO2:  [90 %-96 %] 96 % (05/27 0750) FiO2 (%):  [28 %] 28 % (05/27 0213) Weight:  [74.1 kg (163 lb 5.8 oz)] 74.1 kg (163 lb 5.8 oz) (05/27 0506) Office follow up Special Information or instructions. Per Skilled nursing facility. He is followed by Dr. [name] in [place] and can follow up with him. Note a copy of this discharge summary was sent to Dr. [name]. Signed: Brett Canales Minor ACNP Adolph Pollack PCCM Pager [phone] till 3 pm If no answer page [phone] 05/26/2013, 1:21 PM  I agree with the above note Shan Levans

## 2013-05-27 LAB — GLUCOSE, CAPILLARY
Glucose-Capillary: 53 mg/dL — ABNORMAL LOW (ref 70–99)
Glucose-Capillary: 55 mg/dL — ABNORMAL LOW (ref 70–99)
Glucose-Capillary: 76 mg/dL (ref 70–99)
Glucose-Capillary: 157 mg/dL — ABNORMAL HIGH (ref 70–99)
Glucose-Capillary: 171 mg/dL — ABNORMAL HIGH (ref 70–99)
Glucose-Capillary: 187 mg/dL — ABNORMAL HIGH (ref 70–99)

## 2013-05-28 LAB — GLUCOSE, CAPILLARY
Glucose-Capillary: 139 mg/dL — ABNORMAL HIGH (ref 70–99)
Glucose-Capillary: 169 mg/dL — ABNORMAL HIGH (ref 70–99)

## 2013-05-29 LAB — GLUCOSE, CAPILLARY
Glucose-Capillary: 235 mg/dL — ABNORMAL HIGH (ref 70–99)
Glucose-Capillary: 104 mg/dL — ABNORMAL HIGH (ref 70–99)
Glucose-Capillary: 156 mg/dL — ABNORMAL HIGH (ref 70–99)

## 2013-05-30 LAB — GLUCOSE, CAPILLARY: Glucose-Capillary: 125 mg/dL — ABNORMAL HIGH (ref 70–99)

## 2013-05-31 LAB — GLUCOSE, CAPILLARY
Glucose-Capillary: 175 mg/dL — ABNORMAL HIGH (ref 70–99)
Glucose-Capillary: 168 mg/dL — ABNORMAL HIGH (ref 70–99)
Glucose-Capillary: 148 mg/dL — ABNORMAL HIGH (ref 70–99)
Glucose-Capillary: 130 mg/dL — ABNORMAL HIGH (ref 70–99)

## 2013-06-01 LAB — GLUCOSE, CAPILLARY: Glucose-Capillary: 102 mg/dL — ABNORMAL HIGH (ref 70–99)

## 2013-06-01 NOTE — H&P (Signed)
[name] [name] MRN: [id number] DOB/AGE: 03-24-1936 77 y.o. Primary Care Physician:Zamya Culhane L, MD Admit date: 05/26/2013 Chief Complaint: Shortness of breath and weakness HPI: This is a 77 year old who was transferred to the skilled care facility from [facility] after an episode of hemoptysis. He had a prolonged illness and has been severely weak. He has multiple medical problems including COPD, atrial fibrillation, history of small bowel obstruction renal insufficiency diabetes and CHF. He is working with physical therapy and has improved somewhat.  Past Medical History  Diagnosis Date  . COPD (chronic obstructive pulmonary disease)   . AF (atrial fibrillation)   . Hypertension   . Gout   . Arthritis   . Small bowel obstruction   . Renal insufficiency   . Aortic stenosis   . Hydropneumothorax   . Diabetes mellitus   . CHF (congestive heart failure)   . On home O2     qhs prn  . Pneumonia 05/04/2013    history of pneumonia  . Pneumonia 10/12  . Hydropneumothorax 2012   Past Surgical History  Procedure Laterality Date  . Rotator cuff repair      left and right  . Cardiac valve replacement      aortic valve with a tissue prosthesis and left sided maze proc  . S/p rewiring of sternum for dehiscence    . Gallbladder surgery    . US echocardiography  01-03-11    EF 55-60%  . Cardiovascular stress test  01-26-09    EF 67%  . Coronary angioplasty    . Cholecystectomy    . Sternal incision reclosure  08/29/2010    sternal rewiring  . Cataract extraction w/phaco  10/02/2012    Procedure: CATARACT EXTRACTION PHACO AND INTRAOCULAR LENS PLACEMENT (IOC);  Surgeon: Gemma Payor, MD;  Location: [facility];  Service: Ophthalmology;  Laterality: Right;  CDE 16.68  . Cataract extraction w/phaco  10/27/2012    Procedure: CATARACT EXTRACTION PHACO AND INTRAOCULAR LENS PLACEMENT (IOC);  Surgeon: Gemma Payor, MD;  Location: [facility];  Service: Ophthalmology;  Laterality: Left;  CDE:16.89  .  Artificial aortic valve          Family History  Problem Relation Age of Onset  . Hypertension Mother   . Stroke Mother   . Heart disease Sister   . Kidney disease Sister     Social History:  reports that he quit smoking about 8 years ago. His smoking use included Cigarettes. He smoked 0.00 packs per day. He has never used smokeless tobacco. He reports that he does not drink alcohol or use illicit drugs.   Allergies: No Known Allergies  Medications Prior to Admission  Medication Sig Dispense Refill  . acetaminophen (TYLENOL) 500 MG tablet Take 1,000 mg by mouth every 6 (six) hours as needed. Pain      . aspirin 81 MG tablet Take 81 mg by mouth daily.        Marland Kitchen diltiazem (DILACOR XR) 240 MG 24 hr capsule Take 240 mg by mouth daily.       . fluticasone (FLOVENT HFA) 110 MCG/ACT inhaler Inhale 1 puff into the lungs 2 (two) times daily.      . furosemide (LASIX) 40 MG tablet Take 40 mg by mouth daily.        Marland Kitchen glyBURIDE (DIABETA) 5 MG tablet Take 1 tablet (5 mg total) by mouth 2 (two) times daily with a meal.      . insulin aspart (NOVOLOG) 100 UNIT/ML injection Inject  0-15 Units into the skin 3 (three) times daily with meals.  1 vial  12  . insulin aspart (NOVOLOG) 100 UNIT/ML injection Inject 3 Units into the skin 3 (three) times daily with meals.  1 vial  12  . insulin glargine (LANTUS) 100 UNIT/ML injection Inject 0.1 mLs (10 Units total) into the skin daily.  10 mL  12  . ipratropium-albuterol (DUONEB) 0.5-2.5 (3) MG/3ML SOLN Take 3 mLs by nebulization 4 (four) times daily.      . sodium chloride (OCEAN) 0.65 % SOLN nasal spray Place 1 spray into the nose as needed for congestion.    0       [id number] from the symptoms mentioned above,there are no other symptoms referable to all systems reviewed.  Physical Exam: There were no vitals taken for this visit. He is awake and alert. He looks weak and is somewhat short of breath with minimal exertion. His chest is with prolonged  expiration but no wheezes. His heart is irregular but with well controlled ventricular response.Marland Kitchen His abdomen is soft. Extremities showed no edema. Central nervous system examination is grossly intact.   No results found for this basename: WBC, NEUTROABS, HGB, HCT, MCV, PLT,  in the last 72 hours No results found for this basename: NA, K, CL, CO2, GLUCOSE, BUN, CREATININE, CALCIUM, MG, PHO,  in the last 72 hourslablast2(ast:2,ALT:2,alkphos:2,bilitot:2,prot:2,albumin:2)@    Recent Results (from the past 240 hour(s))  BODY FLUID CULTURE     Status: None   Collection Time    05/22/13 11:38 AM      Result Value Range Status   Specimen Description PLEURAL LEFT FLUID   Final   Special Requests 7.0ML FLUID   Final   Gram Stain     Final   Value: NO WBC SEEN     NO ORGANISMS SEEN   Culture NO GROWTH 3 DAYS   Final   Report Status 05/25/2013 FINAL   Final     Dg Chest 2 View  05/25/2013   *RADIOLOGY REPORT*  Clinical Data: Chest tightness, cough, congestion, history of COPD and CHF  CHEST - 2 VIEW  Comparison: 05/23/2013; 05/20/2013; chest CT - 05/21/2013  Findings: Grossly unchanged enlarged cardiac silhouette post median sternotomy and aortic valve replacement. The lungs remain hyperexpanded with mild diffuse thickening of the interstitium.  No significant change in bilateral mid and left lower lung coarse reticular opacities.  Unchanged consolidative left basilar left retrocardiac opacity.  No new focal airspace opacities.  No definite pleural effusion or pneumothorax.  Grossly unchanged bones including stigmata of DISH within the thoracic spine.  IMPRESSION: Grossly unchanged findings suggestive of multifocal infection superimposed on advanced emphysematous change.  A follow-up chest radiograph in 4 to 6 weeks after treatment is recommended to ensure resolution.   Original Report Authenticated By: Tacey Ruiz, MD   Dg Chest 2 View  05/22/2013   *RADIOLOGY REPORT*  Clinical Data: Follow-up  edema.  Question left effusion.  CHEST - 2 VIEW  Comparison: CT chest 05/21/2013 and chest radiograph 05/20/2013.  Findings: Trachea is midline.  Heart is at the upper limits of normal in size.  There is bibasilar dependent air space disease superimposed on emphysema.  Moderate left pleural effusion.  Tiny right pleural effusion.  Flowing anterior osteophytosis is seen in the thoracic spine.  IMPRESSION:  1.  Bibasilar air space disease, as before, indicative of multilobar pneumonia.  Edema not excluded. 2.  Bilateral pleural effusions, left greater than right.   Original  Report Authenticated By: Leanna Battles, M.D.   Dg Chest 2 View  05/16/2013   *RADIOLOGY REPORT*  Clinical Data: Follow up of "infiltrates."  Shortness of breath and weakness.  CHEST - 2 VIEW  Comparison: 05/14/2013  Findings: Prior median sternotomy.  Artifact degradation posteriorly on the lateral view.  Cardiomegaly accentuated by [facility] portable technique.  Probable small left pleural effusion versus pleural thickening. No pneumothorax. Lower lobe predominant interstitial and airspace opacities are minimally improved.  Bilateral.  IMPRESSION: Minimal improvement in bilateral interstitial and airspace opacities.  Infection versus pulmonary edema.   Original Report Authenticated By: Jeronimo Greaves, M.D.   Ct Chest Wo Contrast  05/21/2013   *RADIOLOGY REPORT*  Clinical Data: Evaluate pulmonary infiltrates and mediastinal lymphadenopathy.  Persistent nonproductive cough.  CT CHEST WITHOUT CONTRAST  Technique:  Multidetector CT imaging of the chest was performed following the standard protocol without IV contrast.  Comparison: Chest CT 05/06/2013.  Findings:  Mediastinum: Heart size is enlarged with dilatation of the right atrium.  There is no significant pericardial fluid, thickening or pericardial calcification. There is atherosclerosis of the thoracic aorta, the great vessels of the mediastinum and the coronary arteries, including calcified  atherosclerotic plaque in the left main, left anterior descending, left circumflex and right coronary arteries. Status post median sternotomy for aortic valve replacement (there appears to be a stented bioprosthesis in position).  Ectasia of the proximal descending thoracic aorta and near the anastomotic site of the bioprosthetic valve measuring up to approximately 4.1 cm in diameter, similar to prior studies. No pathologically enlarged mediastinal or hilar lymph nodes. Please note that accurate exclusion of hilar adenopathy is limited on noncontrast CT scans.  Numerous borderline enlarged mediastinal lymph nodes are presumably reactive, including a cluster of subcarinal nodes which appear matted together.  Esophagus is unremarkable in appearance.  Lungs/Pleura: There continues to be extensive confluent air space consolidation and numerous air bronchograms throughout the left lower lobe, compatible with persistent left lower lobe pneumonia. There is now an enlarging moderate left-sided parapneumonic pleural effusion which appears to layer dependently.  Scattered throughout the lungs bilaterally there are increasing areas of patchy ground glass attenuation and septal thickening, concerning for endobronchial spread of infection, most severe in the right lower lobe posteriorly and in the right middle lobe.  The background of moderate centrilobular emphysema and mild diffuse bronchial wall thickening is similar to the prior study.  Frothy secretions are noted in the distal trachea and proximal right main stem bronchi bilaterally (left greater than right).  Upper Abdomen: Low-attenuation bilateral renal lesions similar to prior studies.  Status post cholecystectomy.  Musculoskeletal: There are no aggressive appearing lytic or blastic lesions noted in the visualized portions of the skeleton. Sternotomy wires.  IMPRESSION: 1.  Overall, there is worsening aeration throughout the lungs bilaterally.  Persistent air space  consolidation in the left lower lobe is compatible with a residual lobar pneumonia.  There is evidence of endobronchial spread of infection throughout the remainder of the lungs, most severe in the right middle lobe and right lower lobe. 2.  Enlarging moderate-sized left-sided parapneumonic pleural effusion, which appears to layer dependently at this time. 3. Atherosclerosis, including left main and three-vessel coronary artery disease.   Assessment for potential risk factor modification, dietary therapy or pharmacologic therapy may be warranted, if clinically indicated. 4.  Status post median sternotomy for a bioprosthetic aortic valve replacement. 5.  Mild ectasia of the descending thoracic aorta (4.1 cm in diameter) is unchanged. 6.  Additional  incidental findings, similar to prior studies, as above.   Original Report Authenticated By: Trudie Reed, M.D.   Ct Chest W Contrast  05/06/2013   *RADIOLOGY REPORT*  Clinical Data: Hemoptysis.  Evaluate infiltrates.  CT CHEST WITH CONTRAST  Technique:  Multidetector CT imaging of the chest was performed following the standard protocol during bolus administration of intravenous contrast.  Contrast: 80mL OMNIPAQUE IOHEXOL 300 MG/ML  SOLN  Comparison: Abdomen and pelvis CT 04/23/2013  Findings: Lungs/pleura: There is a small left pleural effusion. Airspace consolidation involving much of the left lower lobe is identified.  This is a new finding when compared with 04/23/2013. Several patchy opacities are noted within the left upper lobe. Moderate background changes of emphysema identified.  Within the right upper lobe there is a 4 mm nodule, image 17/series 3.  This is unchanged from previous exam.  There is a right upper lobe nodule identified measuring 5 mm, image 25/series 3.  Heart/Mediastinum: The heart size is moderately enlarged.  The patient is status post median sternotomy and CABG procedure.  The right paratracheal lymph node is enlarged measuring 1.2 cm,  image 12/series 2.  This is compared with 0.8 cm previously.  The subcarinal lymph node is enlarged measuring 1.5 cm, image 28/series 2.  This is compared with 0.8 cm.  Lymph node posterior to the left mainstem bronchus is new measuring 0.8 cm.  Within the posterior mediastinum there is a lymph node adjacent to the descending thoracic or aorta measuring 9 mm.  Previously this measured 8 mm.  Upper abdomen: The adrenal glands appear normal.  No focal liver abnormalities identified.  There are multiple cysts arising from both kidneys.  The thyroid gland appears unremarkable.  Bones/Musculoskeletal:  Review of the visualized osseous structures shows multilevel spondylosis within the thoracic spine.  No aggressive lytic or sclerotic bone lesions.  IMPRESSION:  1.  Dense airspace consolidation is noted within the left lower lobe.  As this is a new finding from 04/23/2013 this most likely reflects pneumonia and/or aspiration.  Follow-up imaging is advised to ensure resolution.  If this does not resolve then consider further evaluation with bronchoscopy and tissue sampling. 2.  Multiple enlarged mediastinal lymph nodes are new from 09/06/2010.  In the setting of pneumonia this is a nonspecific and may be reactive in etiology. Attention on follow-up imaging advised. 3. 5 mm right upper lobe nodule is new from previous exam. If the patient is at high risk for bronchogenic carcinoma, follow-up chest CT at 6-12 months is recommended.  If the patient is at low risk for bronchogenic carcinoma, follow-up chest CT at 12 months is recommended.  This recommendation follows the consensus statement: Guidelines for Management of Small Pulmonary Nodules Detected on CT Scans: A Statement from the Fleischner Society as published in Radiology 2005; 237:395-400. 4.  Left pleural effusion.   Original Report Authenticated By: Signa Kell, M.D.   Dg Chest Port 1 View  05/23/2013   *RADIOLOGY REPORT*  Clinical Data: Follow up pneumonia.   PORTABLE CHEST - 1 VIEW 05/23/2013 0622 hours:  Comparison: Portable chest x-ray yesterday and 05/20/2013 and two- view chest x-ray yesterday.  CT chest 05/21/2013.  Findings: Prior sternotomy for CABG.  Cardiac silhouette enlarged but stable.  Emphysematous changes in the upper lobes.  Improvement in the patchy airspace opacities throughout both lungs, most confluent in the left lower lobe, though moderate opacity persists. Stable left pleural effusion.  No new pulmonary parenchymal abnormalities.  IMPRESSION: Improving pneumonia in both lungs,  though moderate patchy airspace opacities persist.  Stable small left pleural effusion.  No new abnormalities.   Original Report Authenticated By: Hulan Saas, M.D.   Dg Chest Port 1 View  05/22/2013   *RADIOLOGY REPORT*  Clinical Data: Post thoracentesis  PORTABLE CHEST - 1 VIEW  Comparison: Portable exam 1155 hours compared to 0817 hours  Findings: Upper normal heart size post median sternotomy and AVR. Atherogenic calcification aorta. Bibasilar airspace infiltrates most consistent with pneumonia, greater on left. Small left pleural effusion, decreased. Upper lungs grossly clear. No definite pneumothorax. Bones appear demineralized.  IMPRESSION: No pneumothorax. Bibasilar airspace infiltrates most consistent with pneumonia, greater on left, little changed.   Original Report Authenticated By: Ulyses Southward, M.D.   Dg Chest Portable 1 View  05/20/2013   *RADIOLOGY REPORT*  Clinical Data: Follow up of pulmonary edema.  Shortness of breath. Chest tightness.  Cough.  Nonsmoker.  PORTABLE CHEST - 1 VIEW  Comparison: 05/19/2013  Findings: Prior median sternotomy. Cardiomegaly accentuated by AP portable technique.  Possible small left pleural effusion. No pneumothorax.  Lower lobe predominant, left greater than right interstitial and airspace opacities are not significantly changed.  IMPRESSION: No significant change since the prior exam.  Left greater than right  interstitial and airspace opacities. Infection versus alveolar pulmonary edema.  Possible small left pleural effusion.   Original Report Authenticated By: Jeronimo Greaves, M.D.   Dg Chest Portable 1 View  05/19/2013   *RADIOLOGY REPORT*  Clinical Data: Follow up infiltrates.  PORTABLE CHEST - 1 VIEW  Comparison: 05/18/2013.  Findings: Stable cardiac enlargement.  Bilateral interstitial and airspace process not significantly changed.  It could reflect asymmetric edema but is more likely bilateral infiltrates. Probable small left effusion.  IMPRESSION: Persistent bilateral infiltrates.   Original Report Authenticated By: Rudie Meyer, M.D.   Dg Chest Port 1 View  05/18/2013   *RADIOLOGY REPORT*  Clinical Data: Pulmonary infiltrates.  PORTABLE CHEST - 1 VIEW  Comparison: 05/16/2013.  Findings: Persistent bilateral lower lobe infiltrates.  No definite pleural effusion or pneumothorax.  IMPRESSION: Persistent bilateral infiltrates.   Original Report Authenticated By: Rudie Meyer, M.D.   Dg Chest Port 1 View  05/14/2013   *RADIOLOGY REPORT*  Clinical Data: Respiratory failure, follow-up  PORTABLE CHEST - 1 VIEW  Comparison: Portable chest x-ray of 05/13/2013  Findings: The endotracheal tube appears to have been removed. There is little change in aeration.  Patchy airspace disease remains left greater than right with probable left effusion. Cardiomegaly is stable. The left central venous line remains.  IMPRESSION: Endotracheal tube removed.  No change in aeration.  Persistent patchy opacities bilaterally.   Original Report Authenticated By: Dwyane Dee, M.D.   Dg Chest Port 1 View  05/13/2013   *RADIOLOGY REPORT*  Clinical Data: Respiratory failure  PORTABLE CHEST - 1 VIEW  Comparison: 05/12/2013  Findings: 0510 hours.  Endotracheal tube tip is approximately 3.2 cm above the base of the carina. The NG tube passes into the stomach although the distal tip position is not included on the film.  Left IJ central line  tip overlies the proximal SVC. Cardiopericardial silhouette is at upper limits of normal for size. The by basilar airspace disease persists without substantial change. Telemetry leads overlie the chest.  IMPRESSION: No appreciable interval change in the bibasilar airspace disease.   Original Report Authenticated By: Kennith Center, M.D.   Dg Chest Port 1 View  05/12/2013   *RADIOLOGY REPORT*  Clinical Data: Central line placement  PORTABLE CHEST - 1  VIEW  Comparison: 1320 hours  Findings: New left internal jugular vein central venous catheter has been placed with its tip in the mid SVC.  No pneumothorax. Stable bilateral airspace disease.  Stable endotracheal and NG tubes.  Stable cardiac silhouette.  IMPRESSION: Left internal jugular vein central venous catheter placement with its tip in the mid SVC and no pneumothorax.  Stable bilateral airspace disease.   Original Report Authenticated By: Jolaine Click, M.D.   Portable Chest Xray  05/12/2013   *RADIOLOGY REPORT*  Clinical Data: Status post intubation.  PORTABLE CHEST - 1 VIEW  Comparison: Portable chest 05/12/2013.  Findings: The patient has a new endotracheal tube in place with the tip in good position approximately 4 cm above the carina.  NG tube courses into the stomach and below the inferior margin of film. Extensive bilateral airspace disease is again seen.  No pneumothorax.  IMPRESSION:  1.  ET tube and NG tube in good position. 2.  Extensive bilateral airspace disease.   Original Report Authenticated By: Holley Dexter, M.D.   Dg Chest Port 1 View  05/12/2013   *RADIOLOGY REPORT*  Clinical Data: Respiratory failure  PORTABLE CHEST - 1 VIEW  Comparison: 05/11/2013  Findings: Bilateral airspace opacities, left greater than right, similar to mildly increased.  Aortic atherosclerosis.  Heart size mildly prominent.  The left heart margin is partially obscured by the consolidation.  Left pleural effusion not excluded.  No pneumothorax.  Status post median  sternotomy.  Osteopenia.  No acute osseous finding.  IMPRESSION: Bilateral airspace opacities are similar to mildly increased in the interval.  Differential includes multifocal pneumonia, aspiration and asymmetric edema.   Original Report Authenticated By: Jearld Lesch, M.D.   Dg Chest Port 1 View  05/11/2013   *RADIOLOGY REPORT*  Clinical Data: Evaluate endotracheal tube.  PORTABLE CHEST - 1 VIEW  Comparison: 05/09/2013  Findings: Prior median sternotomy.  Borderline cardiomegaly. Possible small left pleural effusion, similar. No pneumothorax. Slight worsening left lower lobe and infrahilar airspace disease. New or increased right lower lobe airspace disease.  IMPRESSION: Worsened aeration, with increased left greater than right airspace disease.  Considerations include multifocal infection/aspiration or alveolar pulmonary edema.   Original Report Authenticated By: Jeronimo Greaves, M.D.   Dg Chest Port 1 View  05/09/2013   *RADIOLOGY REPORT*  Clinical Data: Respiratory failure.  PORTABLE CHEST - 1 VIEW  Comparison: 05/08/2013  Findings: Stable left lower lung infiltrate.  No pulmonary edema is identified.  Right lung expansion improved.  Stable cardiomegaly.  IMPRESSION: Stable left lower lung infiltrate.  Improved aeration of the right lung.   Original Report Authenticated By: Irish Lack, M.D.   Dg Chest Port 1 View  05/08/2013   *RADIOLOGY REPORT*  Clinical Data: Follow up respiratory failure  PORTABLE CHEST - 1 VIEW  Comparison: Prior chest x-ray 05/07/2013  Findings: Stable to slightly improved left mid and lower lung interstitial and airspace disease.  Mild cardiomegaly.  Status post median sternotomy.  No pneumothorax or enlarging effusion. Small left effusion appears stable.  No acute osseous abnormality.  IMPRESSION:  Slight radiographic improvement in the left lower lobe infiltrate.  Stable small left effusion.   Original Report Authenticated By: Malachy Moan, M.D.   Dg Chest Port 1  View  05/07/2013   *RADIOLOGY REPORT*  Clinical Data: 77 year old male with respiratory distress, shortness of breath.  PORTABLE CHEST - 1 VIEW  Comparison: 05/06/2013 and earlier.  Findings: AP portable semi upright view at 0435 hours.  Since 04/05/2013,  widespread airspace disease at the left lower and midlung has progressed.  Increased left lung base density and obscuration of the left hemidiaphragm.  The right lung is stable.  Stable cardiac size and mediastinal contours.  Visualized tracheal air column is within normal limits.  IMPRESSION: Radiographically progressed left lung pneumonia since 05/05/2013. Dense left lower lobe consolidation.   Original Report Authenticated By: Erskine Speed, M.D.   Dg Chest Port 1 View  05/05/2013   *RADIOLOGY REPORT*  Clinical Data: 77 year old male shortness of breath and cough.  PORTABLE CHEST - 1 VIEW  Comparison: 05/04/2013 and earlier.  Findings: Portable semi upright AP view 0429 hours.  Stable lung volumes.  Stable cardiac size and mediastinal contours.  Mildly decreased patchy confluent basilar airspace disease, greater on the left .  No areas of worsening ventilation.  No pneumothorax or definite effusion.  IMPRESSION: Interval mild radiographic regression of left greater than right lung base airspace disease compatible with pneumonia.   Original Report Authenticated By: Erskine Speed, M.D.   Dg Chest Portable 1 View  05/04/2013   *RADIOLOGY REPORT*  Clinical Data: Hemoptysis.  Coughing up blood.  Recent removal of tube from the throat.  Short of breath.  PORTABLE CHEST - 1 VIEW  Comparison: 04/21/2013.  Findings: Cardiomegaly and median sternotomy.  Bioprosthetic aortic valve replacement.  Left greater than right coursed airspace opacification is present radiating from the hilum to the bases. The distribution suggests aspiration.  In a patient with hemoptysis, vasculitic pulmonary hemorrhage is a consideration although the pattern is usually more fine in appearance.   IMPRESSION:  1.  Left greater than right basilar airspace disease most compatible with aspiration.  Pneumonia, pulmonary hemorrhage and edema are considered less likely. 2.  Median sternotomy and aortic valve replacement.   Original Report Authenticated By: Andreas Newport, M.D.   Impression: He has physical deconditioning. He has COPD which is pretty severe. He has congestive heart failure which is doing better. He has atrial fibrillation unchanged Active Problems:   * No active hospital problems. *     Plan: Continue treatments PT OT et Karie Soda. Plans will be for him to be discharged home probably with home health assistance when he has improved      Artesha Wemhoff L Pager [id number]  06/01/2013, 8:05 AM

## 2013-06-02 LAB — GLUCOSE, CAPILLARY: Glucose-Capillary: 221 mg/dL — ABNORMAL HIGH (ref 70–99)

## 2013-06-03 LAB — GLUCOSE, CAPILLARY
Glucose-Capillary: 91 mg/dL (ref 70–99)
Glucose-Capillary: 74 mg/dL (ref 70–99)
Glucose-Capillary: 251 mg/dL — ABNORMAL HIGH (ref 70–99)
Glucose-Capillary: 211 mg/dL — ABNORMAL HIGH (ref 70–99)

## 2013-06-04 LAB — GLUCOSE, CAPILLARY
Glucose-Capillary: 200 mg/dL — ABNORMAL HIGH (ref 70–99)
Glucose-Capillary: 112 mg/dL — ABNORMAL HIGH (ref 70–99)

## 2013-06-05 LAB — GLUCOSE, CAPILLARY
Glucose-Capillary: 194 mg/dL — ABNORMAL HIGH (ref 70–99)
Glucose-Capillary: 111 mg/dL — ABNORMAL HIGH (ref 70–99)

## 2013-06-06 LAB — GLUCOSE, CAPILLARY
Glucose-Capillary: 82 mg/dL (ref 70–99)
Glucose-Capillary: 185 mg/dL — ABNORMAL HIGH (ref 70–99)
Glucose-Capillary: 255 mg/dL — ABNORMAL HIGH (ref 70–99)

## 2013-06-07 ENCOUNTER — Encounter (HOSPITAL_COMMUNITY): Payer: Self-pay | Admitting: *Deleted

## 2013-06-07 ENCOUNTER — Emergency Department (HOSPITAL_COMMUNITY): Payer: Medicare Other

## 2013-06-07 ENCOUNTER — Inpatient Hospital Stay (HOSPITAL_COMMUNITY)
Admission: EM | Admit: 2013-06-07 | Discharge: 2013-06-22 | DRG: 193 | Disposition: A | Discharge status: Home Health | Payer: Medicare Other | Attending: Pulmonary Disease | Admitting: Pulmonary Disease

## 2013-06-07 DIAGNOSIS — I5032 Chronic diastolic (congestive) heart failure: Secondary | ICD-10-CM

## 2013-06-07 DIAGNOSIS — Z954 Presence of other heart-valve replacement: Secondary | ICD-10-CM

## 2013-06-07 DIAGNOSIS — M109 Gout, unspecified: Secondary | ICD-10-CM | POA: Diagnosis present

## 2013-06-07 DIAGNOSIS — I5033 Acute on chronic diastolic (congestive) heart failure: Secondary | ICD-10-CM

## 2013-06-07 DIAGNOSIS — Z794 Long term (current) use of insulin: Secondary | ICD-10-CM

## 2013-06-07 DIAGNOSIS — Z87891 Personal history of nicotine dependence: Secondary | ICD-10-CM

## 2013-06-07 DIAGNOSIS — K5730 Diverticulosis of large intestine without perforation or abscess without bleeding: Secondary | ICD-10-CM | POA: Diagnosis present

## 2013-06-07 DIAGNOSIS — Z8701 Personal history of pneumonia (recurrent): Secondary | ICD-10-CM

## 2013-06-07 DIAGNOSIS — Z7982 Long term (current) use of aspirin: Secondary | ICD-10-CM

## 2013-06-07 DIAGNOSIS — Z9981 Dependence on supplemental oxygen: Secondary | ICD-10-CM

## 2013-06-07 DIAGNOSIS — I10 Essential (primary) hypertension: Secondary | ICD-10-CM

## 2013-06-07 DIAGNOSIS — E1165 Type 2 diabetes mellitus with hyperglycemia: Secondary | ICD-10-CM | POA: Diagnosis present

## 2013-06-07 DIAGNOSIS — M129 Arthropathy, unspecified: Secondary | ICD-10-CM | POA: Diagnosis present

## 2013-06-07 DIAGNOSIS — I4892 Unspecified atrial flutter: Secondary | ICD-10-CM

## 2013-06-07 DIAGNOSIS — I509 Heart failure, unspecified: Secondary | ICD-10-CM | POA: Diagnosis present

## 2013-06-07 DIAGNOSIS — IMO0001 Reserved for inherently not codable concepts without codable children: Secondary | ICD-10-CM | POA: Diagnosis present

## 2013-06-07 DIAGNOSIS — I129 Hypertensive chronic kidney disease with stage 1 through stage 4 chronic kidney disease, or unspecified chronic kidney disease: Secondary | ICD-10-CM | POA: Diagnosis present

## 2013-06-07 DIAGNOSIS — D500 Iron deficiency anemia secondary to blood loss (chronic): Secondary | ICD-10-CM | POA: Diagnosis present

## 2013-06-07 DIAGNOSIS — K259 Gastric ulcer, unspecified as acute or chronic, without hemorrhage or perforation: Secondary | ICD-10-CM | POA: Diagnosis present

## 2013-06-07 DIAGNOSIS — N189 Chronic kidney disease, unspecified: Secondary | ICD-10-CM | POA: Diagnosis present

## 2013-06-07 DIAGNOSIS — R1110 Vomiting, unspecified: Secondary | ICD-10-CM | POA: Diagnosis present

## 2013-06-07 DIAGNOSIS — Z9861 Coronary angioplasty status: Secondary | ICD-10-CM

## 2013-06-07 DIAGNOSIS — D649 Anemia, unspecified: Secondary | ICD-10-CM

## 2013-06-07 DIAGNOSIS — J90 Pleural effusion, not elsewhere classified: Secondary | ICD-10-CM | POA: Diagnosis present

## 2013-06-07 DIAGNOSIS — IMO0002 Reserved for concepts with insufficient information to code with codable children: Secondary | ICD-10-CM | POA: Diagnosis present

## 2013-06-07 DIAGNOSIS — K222 Esophageal obstruction: Secondary | ICD-10-CM | POA: Diagnosis present

## 2013-06-07 DIAGNOSIS — J9620 Acute and chronic respiratory failure, unspecified whether with hypoxia or hypercapnia: Secondary | ICD-10-CM

## 2013-06-07 DIAGNOSIS — K2960 Other gastritis without bleeding: Secondary | ICD-10-CM | POA: Diagnosis present

## 2013-06-07 DIAGNOSIS — I359 Nonrheumatic aortic valve disorder, unspecified: Secondary | ICD-10-CM | POA: Diagnosis present

## 2013-06-07 DIAGNOSIS — I4891 Unspecified atrial fibrillation: Secondary | ICD-10-CM | POA: Diagnosis present

## 2013-06-07 DIAGNOSIS — N179 Acute kidney failure, unspecified: Secondary | ICD-10-CM

## 2013-06-07 DIAGNOSIS — J441 Chronic obstructive pulmonary disease with (acute) exacerbation: Secondary | ICD-10-CM | POA: Diagnosis present

## 2013-06-07 DIAGNOSIS — J449 Chronic obstructive pulmonary disease, unspecified: Secondary | ICD-10-CM | POA: Diagnosis present

## 2013-06-07 DIAGNOSIS — K648 Other hemorrhoids: Secondary | ICD-10-CM | POA: Diagnosis present

## 2013-06-07 DIAGNOSIS — J189 Pneumonia, unspecified organism: Principal | ICD-10-CM

## 2013-06-07 DIAGNOSIS — Z79899 Other long term (current) drug therapy: Secondary | ICD-10-CM

## 2013-06-07 LAB — BLOOD GAS, ARTERIAL
Bicarbonate: 32.7 mEq/L — ABNORMAL HIGH (ref 20.0–24.0)
O2 Content: 2 L/min
pCO2 arterial: 46.3 mmHg — ABNORMAL HIGH (ref 35.0–45.0)
pO2, Arterial: 61.2 mmHg — ABNORMAL LOW (ref 80.0–100.0)

## 2013-06-07 LAB — TROPONIN I: Troponin I: <0.3 ng/mL (ref <0.30)

## 2013-06-07 LAB — COMPREHENSIVE METABOLIC PANEL
ALT: 13 U/L (ref 0–53)
AST: 15 U/L (ref 0–37)
Alkaline Phosphatase: 95 U/L (ref 39–117)
CO2: 33 mEq/L — ABNORMAL HIGH (ref 19–32)
Calcium: 8.4 mg/dL (ref 8.4–10.5)
Chloride: 92 mEq/L — ABNORMAL LOW (ref 96–112)
GFR calc non Af Amer: 68 mL/min — ABNORMAL LOW (ref >90)
Glucose, Bld: 233 mg/dL — ABNORMAL HIGH (ref 70–99)
Potassium: 4.5 mEq/L (ref 3.5–5.1)
Sodium: 133 mEq/L — ABNORMAL LOW (ref 135–145)
Total Bilirubin: 1.2 mg/dL (ref 0.3–1.2)

## 2013-06-07 LAB — CBC WITH DIFFERENTIAL/PLATELET
Basophils Relative: 0 % (ref 0–1)
Eosinophils Relative: 0 % (ref 0–5)
HCT: 27.1 % — ABNORMAL LOW (ref 39.0–52.0)
Hemoglobin: 8.4 g/dL — ABNORMAL LOW (ref 13.0–17.0)
Lymphocytes Relative: 10 % — ABNORMAL LOW (ref 12–46)
Monocytes Relative: 28 % — ABNORMAL HIGH (ref 3–12)
Neutro Abs: 5.8 10*3/uL (ref 1.7–7.7)
Neutrophils Relative %: 62 % (ref 43–77)
RBC: 2.89 MIL/uL — ABNORMAL LOW (ref 4.22–5.81)

## 2013-06-07 LAB — MRSA PCR SCREENING: MRSA by PCR: NEGATIVE

## 2013-06-07 LAB — GLUCOSE, CAPILLARY
Glucose-Capillary: 61 mg/dL — ABNORMAL LOW (ref 70–99)
Glucose-Capillary: 113 mg/dL — ABNORMAL HIGH (ref 70–99)

## 2013-06-07 LAB — PRO B NATRIURETIC PEPTIDE: Pro B Natriuretic peptide (BNP): 2121 pg/mL — ABNORMAL HIGH (ref 0–450)

## 2013-06-07 MED ORDER — SODIUM CHLORIDE 0.9 % IJ SOLN
3.0000 mL | Freq: Two times a day (BID) | INTRAMUSCULAR | Status: DC
  Administered 2013-06-07 – 2013-06-17 (×7): 3 mL via INTRAVENOUS

## 2013-06-07 MED ORDER — FLUTICASONE PROPIONATE HFA 110 MCG/ACT IN AERO
1.0000 | INHALATION_SPRAY | Freq: Two times a day (BID) | RESPIRATORY_TRACT | Status: DC
  Administered 2013-06-07 – 2013-06-22 (×30): 1 via RESPIRATORY_TRACT
  Filled 2013-06-07: qty 12

## 2013-06-07 MED ORDER — DEXTROSE 5 % IV SOLN
INTRAVENOUS | Status: AC
  Filled 2013-06-07 (×2): qty 1

## 2013-06-07 MED ORDER — INSULIN GLARGINE 100 UNIT/ML ~~LOC~~ SOLN
10.0000 [IU] | SUBCUTANEOUS | Status: DC
  Administered 2013-06-08 – 2013-06-10 (×3): 10 [IU] via SUBCUTANEOUS
  Filled 2013-06-07 (×5): qty 0.1

## 2013-06-07 MED ORDER — LEVALBUTEROL HCL 0.63 MG/3ML IN NEBU
0.6300 mg | INHALATION_SOLUTION | RESPIRATORY_TRACT | Status: DC | PRN
  Administered 2013-06-08 – 2013-06-21 (×11): 0.63 mg via RESPIRATORY_TRACT
  Filled 2013-06-07 (×14): qty 3

## 2013-06-07 MED ORDER — IPRATROPIUM BROMIDE 0.02 % IN SOLN
0.5000 mg | Freq: Once | RESPIRATORY_TRACT | Status: AC
  Administered 2013-06-07: 0.5 mg via RESPIRATORY_TRACT
  Filled 2013-06-07: qty 2.5

## 2013-06-07 MED ORDER — LEVALBUTEROL HCL 0.63 MG/3ML IN NEBU
0.6300 mg | INHALATION_SOLUTION | Freq: Four times a day (QID) | RESPIRATORY_TRACT | Status: DC
  Administered 2013-06-07 – 2013-06-08 (×3): 0.63 mg via RESPIRATORY_TRACT
  Filled 2013-06-07 (×3): qty 3

## 2013-06-07 MED ORDER — SODIUM CHLORIDE 0.9 % IV SOLN
250.0000 mL | INTRAVENOUS | Status: DC | PRN
  Administered 2013-06-16 – 2013-06-21 (×2): 250 mL via INTRAVENOUS

## 2013-06-07 MED ORDER — SODIUM CHLORIDE 0.9 % IJ SOLN
3.0000 mL | INTRAMUSCULAR | Status: DC | PRN
  Administered 2013-06-17: 3 mL via INTRAVENOUS

## 2013-06-07 MED ORDER — VANCOMYCIN HCL IN DEXTROSE 1-5 GM/200ML-% IV SOLN
1000.0000 mg | Freq: Once | INTRAVENOUS | Status: DC
  Filled 2013-06-07: qty 200

## 2013-06-07 MED ORDER — ENOXAPARIN SODIUM 40 MG/0.4ML ~~LOC~~ SOLN
40.0000 mg | SUBCUTANEOUS | Status: DC
  Administered 2013-06-07 – 2013-06-17 (×11): 40 mg via SUBCUTANEOUS
  Filled 2013-06-07 (×11): qty 0.4

## 2013-06-07 MED ORDER — ASPIRIN 81 MG PO CHEW
81.0000 mg | CHEWABLE_TABLET | Freq: Every day | ORAL | Status: DC
  Administered 2013-06-08 – 2013-06-19 (×12): 81 mg via ORAL
  Filled 2013-06-07 (×12): qty 1

## 2013-06-07 MED ORDER — POLYETHYLENE GLYCOL 3350 17 G PO PACK
17.0000 g | PACK | Freq: Every day | ORAL | Status: DC
  Administered 2013-06-08 – 2013-06-15 (×4): 17 g via ORAL
  Filled 2013-06-07 (×9): qty 1

## 2013-06-07 MED ORDER — DEXTROSE 5 % IV SOLN
1.0000 g | Freq: Once | INTRAVENOUS | Status: AC
  Administered 2013-06-07: 1 g via INTRAVENOUS
  Filled 2013-06-07: qty 1

## 2013-06-07 MED ORDER — ACETAMINOPHEN 325 MG PO TABS
650.0000 mg | ORAL_TABLET | ORAL | Status: DC | PRN
  Administered 2013-06-13: 650 mg via ORAL
  Filled 2013-06-07: qty 2

## 2013-06-07 MED ORDER — DILTIAZEM HCL ER COATED BEADS 180 MG PO CP24
300.0000 mg | ORAL_CAPSULE | Freq: Every day | ORAL | Status: DC
  Administered 2013-06-08 – 2013-06-09 (×2): 300 mg via ORAL
  Filled 2013-06-07 (×5): qty 1

## 2013-06-07 MED ORDER — METOPROLOL TARTRATE 25 MG PO TABS: 25.0000 mg | ORAL_TABLET | Freq: Two times a day (BID) | ORAL | Status: DC

## 2013-06-07 MED ORDER — ONDANSETRON HCL 4 MG/2ML IJ SOLN: 4.0000 mg | Freq: Four times a day (QID) | INTRAMUSCULAR | Status: DC | PRN

## 2013-06-07 MED ORDER — GLYBURIDE 5 MG PO TABS
5.0000 mg | ORAL_TABLET | Freq: Two times a day (BID) | ORAL | Status: DC
  Filled 2013-06-07 (×4): qty 1

## 2013-06-07 MED ORDER — FLUTICASONE PROPIONATE HFA 110 MCG/ACT IN AERO
INHALATION_SPRAY | RESPIRATORY_TRACT | Status: AC
  Filled 2013-06-07: qty 12

## 2013-06-07 MED ORDER — VANCOMYCIN HCL IN DEXTROSE 750-5 MG/150ML-% IV SOLN
750.0000 mg | Freq: Two times a day (BID) | INTRAVENOUS | Status: DC
Start: 2013-06-08 — End: 2013-06-10
  Administered 2013-06-08 – 2013-06-09 (×4): 750 mg via INTRAVENOUS
  Filled 2013-06-07 (×5): qty 150

## 2013-06-07 MED ORDER — FUROSEMIDE 10 MG/ML IJ SOLN
40.0000 mg | Freq: Two times a day (BID) | INTRAMUSCULAR | Status: DC
  Administered 2013-06-08 – 2013-06-12 (×9): 40 mg via INTRAVENOUS
  Filled 2013-06-07 (×9): qty 4

## 2013-06-07 MED ORDER — VANCOMYCIN HCL IN DEXTROSE 1-5 GM/200ML-% IV SOLN
1000.0000 mg | Freq: Once | INTRAVENOUS | Status: AC
  Administered 2013-06-07: 1000 mg via INTRAVENOUS
  Filled 2013-06-07: qty 200

## 2013-06-07 MED ORDER — VANCOMYCIN HCL IN DEXTROSE 1-5 GM/200ML-% IV SOLN
INTRAVENOUS | Status: AC
  Filled 2013-06-07: qty 200

## 2013-06-07 MED ORDER — BIOTENE DRY MOUTH MT LIQD
15.0000 mL | Freq: Two times a day (BID) | OROMUCOSAL | Status: DC
  Administered 2013-06-07 – 2013-06-22 (×28): 15 mL via OROMUCOSAL

## 2013-06-07 MED ORDER — DEXTROSE 5 % IV SOLN
1.0000 g | Freq: Three times a day (TID) | INTRAVENOUS | Status: DC
  Administered 2013-06-07 – 2013-06-12 (×14): 1 g via INTRAVENOUS
  Filled 2013-06-07 (×15): qty 1

## 2013-06-07 MED ORDER — ALBUTEROL SULFATE (5 MG/ML) 0.5% IN NEBU
2.5000 mg | INHALATION_SOLUTION | Freq: Once | RESPIRATORY_TRACT | Status: AC
  Administered 2013-06-07: 2.5 mg via RESPIRATORY_TRACT
  Filled 2013-06-07: qty 0.5

## 2013-06-07 MED ORDER — PRO-STAT SUGAR FREE PO LIQD
30.0000 mL | Freq: Every day | ORAL | Status: DC
  Administered 2013-06-10 – 2013-06-19 (×4): 30 mL via ORAL
  Filled 2013-06-07 (×10): qty 30

## 2013-06-07 MED ORDER — INSULIN ASPART 100 UNIT/ML ~~LOC~~ SOLN
0.0000 [IU] | Freq: Three times a day (TID) | SUBCUTANEOUS | Status: DC
  Administered 2013-06-08: 2 [IU] via SUBCUTANEOUS
  Administered 2013-06-08: 1 [IU] via SUBCUTANEOUS
  Administered 2013-06-09: 2 [IU] via SUBCUTANEOUS
  Administered 2013-06-09: 1 [IU] via SUBCUTANEOUS
  Administered 2013-06-10: 3 [IU] via SUBCUTANEOUS
  Administered 2013-06-10: 9 [IU] via SUBCUTANEOUS
  Administered 2013-06-11: 3 [IU] via SUBCUTANEOUS

## 2013-06-07 NOTE — H&P (Signed)
[name] History and Physical  [name] [name] WJX:[id number] DOB: 01-23-1936 DOA: 06/07/2013  Referring physician: Dutch Quint, ER physician PCP: Fredirick Maudlin, MD  Specialists: None  Chief Complaint: Shortness of breath  HPI: [name] [name] is a 77 y.o. male  Past medical history including COPD, reportedly CHF, atrial fibrillation and diabetes mellitus who was discharged from the critical care service at [facility] after a three-week stay for hemoptysis complicated with severe COPD, respiratory failure requiring short-term ventilator support, pleural effusion, atrial fibrillation and congestive heart failure who was discharged about 2-1/2 weeks ago and was staying at the [facility] for rehabilitation. Had been followed by Dr. [name], his primary care physician, had been adjusting his Cardizem for rate control issues. He presented to the emergency room today with shortness of breath and associated tachycardia that had been worsening for the past 3 days. In the emergency room, he was noted to have normal labs with the exception of a BNP of 2121 (had been 5100 a few days prior to discharge), a CBG of 220, a stable hemoglobin of 8.4 and a normal white count noting a mild left shift. Patient's chest x-ray was noted for worsening multifocal interstitial and airspace disease throughout the mid and lower lungs bilaterally particularly at the left base where there was noted to be enlarging moderate to large left pleural effusion. This was concerning for a worsening multilobar pneumonia. Hospitals were called for further evaluation and admission.  Review of Systems:  Patient seen after arrival to the floor. He's doing okay, breathing little bit easy than he was earlier today. Denies any headaches, vision changes, dysphasia, chest pain. Some mild palpitations earlier. Still has some shortness of breath with associated nonproductive cough. No wheezing. Denies abdominal pain, hematuria,  dysuria, constipation and diarrhea. Feels overall mildly weak with no focal extremity numbness or weakness or pain. Review of systems otherwise negative.  Past Medical History  Diagnosis Date  . COPD (chronic obstructive pulmonary disease)   . AF (atrial fibrillation)   . Hypertension   . Gout   . Arthritis   . Small bowel obstruction   . Renal insufficiency   . Aortic stenosis   . Hydropneumothorax   . Diabetes mellitus   . CHF (congestive heart failure)   . On home O2     qhs prn  . Pneumonia 05/04/2013    history of pneumonia  . Pneumonia 10/12  . Hydropneumothorax 2012   Past Surgical History  Procedure Laterality Date  . Rotator cuff repair      left and right  . Cardiac valve replacement      aortic valve with a tissue prosthesis and left sided maze proc  . S/p rewiring of sternum for dehiscence    . Gallbladder surgery    . US echocardiography  01-03-11    EF 55-60%  . Cardiovascular stress test  01-26-09    EF 67%  . Coronary angioplasty    . Cholecystectomy    . Sternal incision reclosure  08/29/2010    sternal rewiring  . Cataract extraction w/phaco  10/02/2012    Procedure: CATARACT EXTRACTION PHACO AND INTRAOCULAR LENS PLACEMENT (IOC);  Surgeon: Gemma Payor, MD;  Location: [facility];  Service: Ophthalmology;  Laterality: Right;  CDE 16.68  . Cataract extraction w/phaco  10/27/2012    Procedure: CATARACT EXTRACTION PHACO AND INTRAOCULAR LENS PLACEMENT (IOC);  Surgeon: Gemma Payor, MD;  Location: [facility];  Service: Ophthalmology;  Laterality: Left;  CDE:16.89  .  Artificial aortic valve     Social History:  reports that he quit smoking about 8 years ago. His smoking use included Cigarettes. He smoked 0.00 packs per day. He has never used smokeless tobacco. He reports that he does not drink alcohol or use illicit drugs. Prior to admission, patient was at the [facility] for rehabilitation. He is participating in therapy.  No Known Allergies  Family History  Problem  Relation Age of Onset  . Hypertension Mother   . Stroke Mother   . Heart disease Sister   . Kidney disease Sister     Prior to Admission medications   Medication Sig Start Date End Date Taking? Authorizing Provider  aspirin 81 MG chewable tablet Chew 81 mg by mouth daily.   Yes Historical Provider, MD  diltiazem (CARDIZEM CD) 300 MG 24 hr capsule Take 300 mg by mouth daily.   Yes Historical Provider, MD  feeding supplement (PRO-STAT SUGAR FREE 64) LIQD Take 30 mLs by mouth at bedtime.   Yes Historical Provider, MD  fluticasone (FLOVENT HFA) 110 MCG/ACT inhaler Inhale 1 puff into the lungs 2 (two) times daily.   Yes Historical Provider, MD  furosemide (LASIX) 80 MG tablet Take 80 mg by mouth daily.   Yes Historical Provider, MD  glyBURIDE (DIABETA) 5 MG tablet Take 1 tablet (5 mg total) by mouth 2 (two) times daily with a meal. 05/26/13  Yes Storm Frisk, MD  insulin aspart (NOVOLOG) 100 UNIT/ML injection Inject 3-15 Units into the skin 3 (three) times daily with meals. Sliding scale as follows: 121-150=3 units 151-200=4 units 201-250=7 units 251-300=9 units 301-350=12 units 351-400=15 units   Yes Historical Provider, MD  insulin glargine (LANTUS) 100 UNIT/ML injection Inject 0.1 mLs (10 Units total) into the skin daily. 05/26/13  Yes Storm Frisk, MD  ipratropium-albuterol (DUONEB) 0.5-2.5 (3) MG/3ML SOLN Take 3 mLs by nebulization 4 (four) times daily.   Yes Historical Provider, MD  magnesium hydroxide (MILK OF MAGNESIA) 400 MG/5ML suspension Take 30 mLs by mouth daily as needed for constipation.   Yes Historical Provider, MD  polyethylene glycol (MIRALAX / GLYCOLAX) packet Take 17 g by mouth daily.   Yes Historical Provider, MD  potassium chloride SA (K-DUR,KLOR-CON) 20 MEQ tablet Take 20 mEq by mouth daily.   Yes Historical Provider, MD  acetaminophen (TYLENOL) 500 MG tablet Take 1,000 mg by mouth every 6 (six) hours as needed. Pain    Historical Provider, MD  sodium chloride  (OCEAN) 0.65 % SOLN nasal spray Place 1 spray into the nose as needed for congestion. 05/26/13   Storm Frisk, MD   Physical Exam: Filed Vitals:   06/07/13 1258 06/07/13 1400 06/07/13 1532 06/07/13 1557  BP: 125/75 119/59  146/86  Pulse: 92 76  98  Temp:      TempSrc:      Resp: 22 21  19   Height:    5\' 7"  (1.702 m)  Weight:    74.56 kg (164 lb 6 oz)  SpO2: 95% 96% 94% 92%     General:  Alert and oriented x3, fatigue, no acute distress  Eyes: Sclera nonicteric, extraocular movements are intact  ENT: Normocephalic, atraumatic membranes are slightly dry  Neck: Supple, no visible JVD  Cardiovascular: Regular rhythm, rate controlled, soft 2/6 systolic ejection murmur  Respiratory: Decreased breath sounds throughout, few crackles at bilateral bases  Abdomen: Soft, nontender, nondistended, positive bowel sounds  Skin: No overt skin breaks, tears or lesions  Musculoskeletal: No clubbing  or cyanosis, trace pitting edema from the knees down bilaterally  Psychiatric: Patient is appropriate, no evidence of psychoses  Neurologic: No overt deficits  Labs on Admission:  Basic Metabolic Panel:  Recent Labs Lab 06/07/13 1240  NA 133*  K 4.5  CL 92*  CO2 33*  GLUCOSE 233*  BUN 23  CREATININE 1.04  CALCIUM 8.4   Liver Function Tests:  Recent Labs Lab 06/07/13 1240  AST 15  ALT 13  ALKPHOS 95  BILITOT 1.2  PROT 7.9  ALBUMIN 2.4*   CBC:  Recent Labs Lab 06/07/13 1240  WBC 9.5  NEUTROABS 5.8  HGB 8.4*  HCT 27.1*  MCV 93.8  PLT 170   Cardiac Enzymes:  Recent Labs Lab 06/07/13 1240  TROPONINI <0.30    BNP (last 3 results)  Recent Labs  05/16/13 0420 05/18/13 0440 06/07/13 1240  PROBNP 5765.0* 5846.0* 2121.0*   CBG:  Recent Labs Lab 06/06/13 0651 06/06/13 1112 06/06/13 1624 06/06/13 1919 06/07/13 0526  GLUCAP 82 185* 255* 165* 89    Radiological Exams on Admission: Dg Chest Port 1 View  06/07/2013   IMPRESSION: 1.  Worsening  multifocal interstitial and airspace disease throughout the mid and lower lungs bilaterally, particularly at the left base where there is also an enlarging moderate to large left pleural effusion.  Findings are concerning for old worsening multilobar pneumonia.   Original Report Authenticated By: Trudie Reed, M.D.    EKG: Independently reviewed. AV flutter with first degree block  Assessment/Plan Principal Problem:   HCAP (healthcare-associated pneumonia): Suspected this is the underlying problem. Pneumonia and secondary hypoxia contributing to increased heart rate which is making it a flutter/fib worse leading to slightly worsening heart failure which in turn is making him more short of breath leading to a cycle. Broad-spectrum IV antibiotics plus nebulizers plus oxygen  Active Problems:   Atrial flutter: Continue Cardizem, titrating hypoxia and pneumonia, this should greatly help.   COPD (chronic obstructive pulmonary disease): Oxygen plus nebs plus antibiotics. Do not think that he needs steroids at this time.   Hypertension: Continue home meds.   Type 2 diabetes mellitus, uncontrolled: Continue home meds plus sliding scale.    Acute-on-chronic respiratory failure: Looks to be from pneumonia and possibly a flutter/fib as well.    Acute on chronic diastolic CHF (congestive heart failure), NYHA class 1: Noting echo last month was unremarkable. Elevated BNP could be from combination of A. flutter/A. fib plus pneumonia. Try IV Lasix and monitor output. Watch renal function.   Code Status: Full code  Family Communication: Plan discussed with patient and his wife at the bedside  Disposition Plan: 1-2 days  Time spent: 40 minutes  Hollice Espy [name] Pager [phone]  If 7PM-7AM, please contact night-coverage [email or web address] Password [email or web address] 06/07/2013, 4:49 PM

## 2013-06-07 NOTE — Progress Notes (Signed)
ANTIBIOTIC CONSULT NOTE - INITIAL  Pharmacy Consult for Vancomycin and Cefepime Indication: pneumonia  No Known Allergies  Patient Measurements: Height: 5\' 6"  (167.6 cm) Weight: 164 lb 6 oz (74.56 kg) IBW/kg (Calculated) : 63.8  Vital Signs: Temp: 98 F (36.7 C) (06/08 1111) Temp src: Oral (06/08 1111) BP: 116/67 mmHg (06/08 1649) Pulse Rate: 83 (06/08 1740) Intake/Output from previous day:   Intake/Output from this shift: Total I/O In: -  Out: 550 [Urine:550]  Labs:  Recent Labs  06/07/13 1240  WBC 9.5  HGB 8.4*  PLT 170  CREATININE 1.04   Estimated Creatinine Clearance: 55.4 ml/min (by C-G formula based on Cr of 1.04). No results found for this basename: VANCOTROUGH, Leodis Binet, VANCORANDOM, GENTTROUGH, GENTPEAK, GENTRANDOM, TOBRATROUGH, TOBRAPEAK, TOBRARND, AMIKACINPEAK, AMIKACINTROU, AMIKACIN,  in the last 72 hours   Microbiology: Recent Results (from the past 720 hour(s))  CULTURE, EXPECTORATED SPUTUM-ASSESSMENT     Status: None   Collection Time    05/09/13  3:05 PM      Result Value Range Status   Specimen Description SPUTUM   Final   Special Requests Normal   Final   Sputum evaluation     Final   Value: THIS SPECIMEN IS ACCEPTABLE. RESPIRATORY CULTURE REPORT TO FOLLOW.   Report Status 05/09/2013 FINAL   Final  CULTURE, RESPIRATORY (NON-EXPECTORATED)     Status: None   Collection Time    05/09/13  3:05 PM      Result Value Range Status   Specimen Description SPUTUM   Final   Special Requests NONE   Final   Gram Stain     Final   Value: MODERATE WBC PRESENT, PREDOMINANTLY PMN     FEW SQUAMOUS EPITHELIAL CELLS PRESENT     MODERATE YEAST   Culture MODERATE CANDIDA ALBICANS   Final   Report Status 05/11/2013 FINAL   Final  PNEUMOCYSTIS JIROVECI SMEAR BY DFA     Status: None   Collection Time    05/12/13 12:43 PM      Result Value Range Status   Specimen Source-PJ[facility] BRONCHIAL WASHINGS   Final   Pneumocystis jiroveci Ag NEGATIVE   Final   Comment:  Performed at [facility]  LEGIONELLA CULTURE     Status: None   Collection Time    05/12/13 12:43 PM      Result Value Range Status   Specimen Description BRONCHIAL WASHINGS   Final   Special Requests NONE   Final   Culture NO LEGIONELLA ISOLATED   Final   Report Status 05/18/2013 FINAL   Final  CULTURE, RESPIRATORY (NON-EXPECTORATED)     Status: None   Collection Time    05/12/13 12:43 PM      Result Value Range Status   Specimen Description BRONCHIAL WASHINGS   Final   Special Requests NONE   Final   Gram Stain     Final   Value: RARE WBC PRESENT,BOTH PMN AND MONONUCLEAR     NO SQUAMOUS EPITHELIAL CELLS SEEN     NO ORGANISMS SEEN   Culture NO GROWTH 2 DAYS   Final   Report Status 05/14/2013 FINAL   Final  AFB CULTURE WITH SMEAR     Status: None   Collection Time    05/12/13 12:43 PM      Result Value Range Status   Specimen Description BRONCHIAL WASHINGS   Final   Special Requests NONE   Final   ACID FAST SMEAR NO ACID FAST BACILLI  SEEN   Final   Culture     Final   Value: CULTURE WILL BE EXAMINED FOR 6 WEEKS BEFORE ISSUING A FINAL REPORT   Report Status PENDING   Incomplete  FUNGUS CULTURE W SMEAR     Status: None   Collection Time    05/12/13 12:43 PM      Result Value Range Status   Specimen Description BRONCHIAL WASHINGS   Final   Special Requests NONE   Final   Fungal Smear NO YEAST OR FUNGAL ELEMENTS SEEN   Final   Culture CANDIDA ALBICANS   Final   Report Status PENDING   Incomplete  CULTURE, BLOOD (ROUTINE X 2)     Status: None   Collection Time    05/13/13  2:15 AM      Result Value Range Status   Specimen Description BLOOD LEFT ARM   Final   Special Requests BOTTLES DRAWN AEROBIC AND ANAEROBIC 10CC EACH   Final   Culture  Setup Time 05/13/2013 08:57   Final   Culture NO GROWTH 5 DAYS   Final   Report Status 05/19/2013 FINAL   Final  CULTURE, BLOOD (ROUTINE X 2)     Status: None   Collection Time    05/13/13  2:30 AM      Result Value  Range Status   Specimen Description BLOOD LEFT HAND   Final   Special Requests BOTTLES DRAWN AEROBIC AND ANAEROBIC 10CC EACH   Final   Culture  Setup Time 05/13/2013 08:56   Final   Culture NO GROWTH 5 DAYS   Final   Report Status 05/19/2013 FINAL   Final  URINE CULTURE     Status: None   Collection Time    05/13/13  5:14 AM      Result Value Range Status   Specimen Description URINE, CATHETERIZED   Final   Special Requests NONE   Final   Culture  Setup Time 05/13/2013 05:36   Final   Colony Count NO GROWTH   Final   Culture NO GROWTH   Final   Report Status 05/14/2013 FINAL   Final  CULTURE, RESPIRATORY (NON-EXPECTORATED)     Status: None   Collection Time    05/13/13  8:05 AM      Result Value Range Status   Specimen Description TRACHEAL ASPIRATE   Final   Special Requests NONE   Final   Gram Stain     Final   Value: MODERATE WBC PRESENT,BOTH PMN AND MONONUCLEAR     NO SQUAMOUS EPITHELIAL CELLS SEEN     NO ORGANISMS SEEN   Culture NO GROWTH 2 DAYS   Final   Report Status 05/15/2013 FINAL   Final  BODY FLUID CULTURE     Status: None   Collection Time    05/22/13 11:38 AM      Result Value Range Status   Specimen Description PLEURAL LEFT FLUID   Final   Special Requests 7.0ML FLUID   Final   Gram Stain     Final   Value: NO WBC SEEN     NO ORGANISMS SEEN   Culture NO GROWTH 3 DAYS   Final   Report Status 05/25/2013 FINAL   Final  CULTURE, BLOOD (ROUTINE X 2)     Status: None   Collection Time    06/07/13  2:55 PM      Result Value Range Status   Specimen Description BLOOD LEFT ARM   Final  Special Requests BOTTLES DRAWN AEROBIC ONLY 6CC   Final   Culture PENDING   Incomplete   Report Status PENDING   Incomplete  CULTURE, BLOOD (ROUTINE X 2)     Status: None   Collection Time    06/07/13  3:20 PM      Result Value Range Status   Specimen Description BLOOD LEFT ARM   Final   Special Requests BOTTLES DRAWN AEROBIC AND ANAEROBIC 6CC   Final   Culture PENDING    Incomplete   Report Status PENDING   Incomplete    Medical History: Past Medical History  Diagnosis Date  . COPD (chronic obstructive pulmonary disease)   . AF (atrial fibrillation)   . Hypertension   . Gout   . Arthritis   . Small bowel obstruction   . Renal insufficiency   . Aortic stenosis   . Hydropneumothorax   . Diabetes mellitus   . CHF (congestive heart failure)   . On home O2     qhs prn  . Pneumonia 05/04/2013    history of pneumonia  . Pneumonia 10/12  . Hydropneumothorax 2012   Medications:  Scheduled:  . aspirin  81 mg Oral Daily  . diltiazem  300 mg Oral Daily  . enoxaparin (LOVENOX) injection  40 mg Subcutaneous Q24H  . feeding supplement  30 mL Oral QHS  . fluticasone  1 puff Inhalation BID  . furosemide  40 mg Intravenous Q12H  . glyBURIDE  5 mg Oral BID WC  . [START ON 06/08/2013] insulin aspart  0-9 Units Subcutaneous TID WC  . insulin glargine  10 Units Subcutaneous Q24H  . levalbuterol  0.63 mg Nebulization Q6H  . polyethylene glycol  17 g Oral Daily  . sodium chloride  3 mL Intravenous Q12H  . vancomycin  1,000 mg Intravenous Once   Assessment: Okay for Protocol Vancomycin 1000mg  ordered in ED (still to be given), patient received cefepime in ED.  Goal of Therapy:  Vancomycin trough level 15-20 mcg/ml  Plan:  Cefepime 1gm IV every 8 hours. Vancomycin 1000mg  IV x 1, then 750mg  IV every 12 hours. Measure antibiotic drug levels at steady state Follow up culture results  Mady Gemma 06/07/2013,5:43 PM

## 2013-06-07 NOTE — Progress Notes (Signed)
Hypoglycemic Event  CBG: 61  Treatment: 15 GM carbohydrate snack  Symptoms: None  Follow-up CBG: Time: 1858 CBG Result:113  Possible Reasons for Event: Inadequate meal intake  Comments/MD notified:  Checked pt's CBG prior to supper.  Pt has no symptoms and no complaints.  CBG 61.  Pt was in ED today and did not have lunch.  Pt now eating supper and will check after eating evening meal.  CBG 113 after eating evening meal.      [name] [name]  Remember to initiate Hypoglycemia Order Set & complete

## 2013-06-07 NOTE — ED Provider Notes (Signed)
History  This chart was scribed for Shelda Jakes, MD by Greggory Stallion, ED Scribe. This patient was seen in room [facility]15/[facility] and the patient's care was started at 11:22 AM.  CSN: [id number]  Arrival date & time 06/07/13  1103    Chief Complaint  Patient presents with  . Tachycardia  . Shortness of Breath    The history is provided by the patient and a relative. No language interpreter was used.    HPI Comments: ANDER WAMSER is a 77 y.o. Male with h/o COPD and HTN from [facility] who presents to the Emergency Department complaining of sudden onset, intermittent episodes of SOB with associated tachycardia that started 3 days ago. He states it's worse when he stands up. Pt states he was seen by Dr. [name] on Sunday and his Cardizem was increased on Wednesday. Pt states his Lasix was also recently increased. Pt denies fever, neck pain, sore throat, visual disturbance, CP, abdominal pain, nausea, emesis, diarrhea, urinary symptoms, back pain, HA, weakness, numbness and rash as associated symptoms.    PCP is Dr. Kari Baars  Past Medical History  Diagnosis Date  . COPD (chronic obstructive pulmonary disease)   . AF (atrial fibrillation)   . Hypertension   . Gout   . Arthritis   . Small bowel obstruction   . Renal insufficiency   . Aortic stenosis   . Hydropneumothorax   . Diabetes mellitus   . CHF (congestive heart failure)   . On home O2     qhs prn  . Pneumonia 05/04/2013    history of pneumonia  . Pneumonia 10/12  . Hydropneumothorax 2012    Past Surgical History  Procedure Laterality Date  . Rotator cuff repair      left and right  . Cardiac valve replacement      aortic valve with a tissue prosthesis and left sided maze proc  . S/p rewiring of sternum for dehiscence    . Gallbladder surgery    . US echocardiography  01-03-11    EF 55-60%  . Cardiovascular stress test  01-26-09    EF 67%  . Coronary angioplasty    . Cholecystectomy    . Sternal  incision reclosure  08/29/2010    sternal rewiring  . Cataract extraction w/phaco  10/02/2012    Procedure: CATARACT EXTRACTION PHACO AND INTRAOCULAR LENS PLACEMENT (IOC);  Surgeon: Gemma Payor, MD;  Location: [facility];  Service: Ophthalmology;  Laterality: Right;  CDE 16.68  . Cataract extraction w/phaco  10/27/2012    Procedure: CATARACT EXTRACTION PHACO AND INTRAOCULAR LENS PLACEMENT (IOC);  Surgeon: Gemma Payor, MD;  Location: [facility];  Service: Ophthalmology;  Laterality: Left;  CDE:16.89  . Artificial aortic valve      Family History  Problem Relation Age of Onset  . Hypertension Mother   . Stroke Mother   . Heart disease Sister   . Kidney disease Sister     History  Substance Use Topics  . Smoking status: Former Smoker    Types: Cigarettes    Quit date: 01/18/2005  . Smokeless tobacco: Never Used  . Alcohol Use: No      Review of Systems  Constitutional: Negative for fever.  HENT: Negative for congestion, sore throat, rhinorrhea and neck pain.   Respiratory: Positive for shortness of breath.   Cardiovascular: Positive for leg swelling. Negative for chest pain.  Gastrointestinal: Negative for nausea, vomiting, abdominal pain and diarrhea.  Genitourinary: Negative for dysuria.  Musculoskeletal:  Negative for back pain.  Skin: Negative for rash.  Neurological: Negative for headaches.  Hematological: Bruises/bleeds easily.  Psychiatric/Behavioral: Negative for confusion.  All other systems reviewed and are negative.    Allergies  Review of patient's allergies indicates no known allergies.  Home Medications   Current Outpatient Rx  Name  Route  Sig  Dispense  Refill  . acetaminophen (TYLENOL) 500 MG tablet   Oral   Take 1,000 mg by mouth every 6 (six) hours as needed. Pain         . aspirin 81 MG tablet   Oral   Take 81 mg by mouth daily.           Marland Kitchen diltiazem (DILACOR XR) 240 MG 24 hr capsule   Oral   Take 240 mg by mouth daily.          . fluticasone  (FLOVENT HFA) 110 MCG/ACT inhaler   Inhalation   Inhale 1 puff into the lungs 2 (two) times daily.         . furosemide (LASIX) 40 MG tablet   Oral   Take 40 mg by mouth daily.           Marland Kitchen glyBURIDE (DIABETA) 5 MG tablet   Oral   Take 1 tablet (5 mg total) by mouth 2 (two) times daily with a meal.         . insulin aspart (NOVOLOG) 100 UNIT/ML injection   Subcutaneous   Inject 0-15 Units into the skin 3 (three) times daily with meals.   1 vial   12   . insulin aspart (NOVOLOG) 100 UNIT/ML injection   Subcutaneous   Inject 3 Units into the skin 3 (three) times daily with meals.   1 vial   12   . insulin glargine (LANTUS) 100 UNIT/ML injection   Subcutaneous   Inject 0.1 mLs (10 Units total) into the skin daily.   10 mL   12   . ipratropium-albuterol (DUONEB) 0.5-2.5 (3) MG/3ML SOLN   Nebulization   Take 3 mLs by nebulization 4 (four) times daily.         . sodium chloride (OCEAN) 0.65 % SOLN nasal spray   Nasal   Place 1 spray into the nose as needed for congestion.      0     BP 121/77  Pulse 102  Temp(Src) 98 F (36.7 C) (Oral)  Resp 26  SpO2 97%  Physical Exam  Nursing note and vitals reviewed. Constitutional: He is oriented to person, place, and time. He appears well-developed and well-nourished.  HENT:  Head: Normocephalic and atraumatic.  Mucous membranes moist.  Eyes: Pupils are equal, round, and reactive to light.  Neck: Normal range of motion.  Cardiovascular: Normal rate.   No murmur heard. Pulmonary/Chest: Effort normal and breath sounds normal. He has no wheezes.  Abdominal: Bowel sounds are normal.  Umbilical hernia that is reducible.   Musculoskeletal: Normal range of motion.  Swelling in both legs. 2+ edema.   Neurological: He is alert and oriented to person, place, and time. No cranial nerve deficit. He exhibits normal muscle tone. Coordination normal.  Skin: Skin is warm and dry.    ED Course  Procedures (including critical  care time)  DIAGNOSTIC STUDIES: Oxygen Saturation is 97% on RA, normal by my interpretation.    COORDINATION OF CARE: 11:25 AM-Discussed treatment plan with pt at bedside and pt agreed to plan.   Results for orders placed during the hospital encounter  of 05/26/13  GLUCOSE, CAPILLARY      Result Value Range   Glucose-Capillary 157 (*) 70 - 99 mg/dL   Comment 1 Documented in Chart    GLUCOSE, CAPILLARY      Result Value Range   Glucose-Capillary 88  70 - 99 mg/dL   Comment 1 Orig Pt Id entered as [id number]    GLUCOSE, CAPILLARY      Result Value Range   Glucose-Capillary 76  70 - 99 mg/dL   Comment 1 Documented in Chart     Comment 2 Orig Pt Id entered as [id number]    GLUCOSE, CAPILLARY      Result Value Range   Glucose-Capillary 55 (*) 70 - 99 mg/dL   Comment 1 GLUCOSE GIVEN     Comment 2 Orig Pt Id entered as [id number]    GLUCOSE, CAPILLARY      Result Value Range   Glucose-Capillary 75  70 - 99 mg/dL   Comment 1 Orig Pt Id entered as [id number]    GLUCOSE, CAPILLARY      Result Value Range   Glucose-Capillary 53 (*) 70 - 99 mg/dL   Comment 1 Orig Pt Id entered as [id number]    GLUCOSE, CAPILLARY      Result Value Range   Glucose-Capillary 138 (*) 70 - 99 mg/dL   Comment 1 Orig Pt Id entered as [id number]    GLUCOSE, CAPILLARY      Result Value Range   Glucose-Capillary 171 (*) 70 - 99 mg/dL   Comment 1 Documented in Chart    GLUCOSE, CAPILLARY      Result Value Range   Glucose-Capillary 187 (*) 70 - 99 mg/dL  GLUCOSE, CAPILLARY      Result Value Range   Glucose-Capillary 139 (*) 70 - 99 mg/dL  GLUCOSE, CAPILLARY      Result Value Range   Glucose-Capillary 169 (*) 70 - 99 mg/dL  GLUCOSE, CAPILLARY      Result Value Range   Glucose-Capillary 199 (*) 70 - 99 mg/dL  GLUCOSE, CAPILLARY      Result Value Range   Glucose-Capillary 177 (*) 70 - 99 mg/dL  GLUCOSE, CAPILLARY      Result Value Range   Glucose-Capillary 142 (*) 70 - 99 mg/dL   Comment 1 Documented in Chart    GLUCOSE,  CAPILLARY      Result Value Range   Glucose-Capillary 235 (*) 70 - 99 mg/dL   Comment 1 Documented in Chart    GLUCOSE, CAPILLARY      Result Value Range   Glucose-Capillary 104 (*) 70 - 99 mg/dL   Comment 1 Documented in Chart    GLUCOSE, CAPILLARY      Result Value Range   Glucose-Capillary 156 (*) 70 - 99 mg/dL  GLUCOSE, CAPILLARY      Result Value Range   Glucose-Capillary 125 (*) 70 - 99 mg/dL  GLUCOSE, CAPILLARY      Result Value Range   Glucose-Capillary 216 (*) 70 - 99 mg/dL  GLUCOSE, CAPILLARY      Result Value Range   Glucose-Capillary 125 (*) 70 - 99 mg/dL  GLUCOSE, CAPILLARY      Result Value Range   Glucose-Capillary 74  70 - 99 mg/dL  GLUCOSE, CAPILLARY      Result Value Range   Glucose-Capillary 156 (*) 70 - 99 mg/dL  GLUCOSE, CAPILLARY      Result Value Range   Glucose-Capillary 175 (*) 70 - 99 mg/dL  GLUCOSE, CAPILLARY  Result Value Range   Glucose-Capillary 168 (*) 70 - 99 mg/dL  GLUCOSE, CAPILLARY      Result Value Range   Glucose-Capillary 148 (*) 70 - 99 mg/dL  GLUCOSE, CAPILLARY      Result Value Range   Glucose-Capillary 130 (*) 70 - 99 mg/dL   Comment 1 Documented in Chart    GLUCOSE, CAPILLARY      Result Value Range   Glucose-Capillary 85  70 - 99 mg/dL  GLUCOSE, CAPILLARY      Result Value Range   Glucose-Capillary 169 (*) 70 - 99 mg/dL  GLUCOSE, CAPILLARY      Result Value Range   Glucose-Capillary 121 (*) 70 - 99 mg/dL  GLUCOSE, CAPILLARY      Result Value Range   Glucose-Capillary 102 (*) 70 - 99 mg/dL  GLUCOSE, CAPILLARY      Result Value Range   Glucose-Capillary 126 (*) 70 - 99 mg/dL   Comment 1 Documented in Chart    GLUCOSE, CAPILLARY      Result Value Range   Glucose-Capillary 221 (*) 70 - 99 mg/dL  GLUCOSE, CAPILLARY      Result Value Range   Glucose-Capillary 91  70 - 99 mg/dL  GLUCOSE, CAPILLARY      Result Value Range   Glucose-Capillary 74  70 - 99 mg/dL   Comment 1 Documented in Chart    GLUCOSE, CAPILLARY       Result Value Range   Glucose-Capillary 251 (*) 70 - 99 mg/dL   Comment 1 Documented in Chart    GLUCOSE, CAPILLARY      Result Value Range   Glucose-Capillary 93  70 - 99 mg/dL   Comment 1 Documented in Chart    GLUCOSE, CAPILLARY      Result Value Range   Glucose-Capillary 211 (*) 70 - 99 mg/dL  GLUCOSE, CAPILLARY      Result Value Range   Glucose-Capillary 94  70 - 99 mg/dL  GLUCOSE, CAPILLARY      Result Value Range   Glucose-Capillary 200 (*) 70 - 99 mg/dL  GLUCOSE, CAPILLARY      Result Value Range   Glucose-Capillary 112 (*) 70 - 99 mg/dL  GLUCOSE, CAPILLARY      Result Value Range   Glucose-Capillary 79  70 - 99 mg/dL  GLUCOSE, CAPILLARY      Result Value Range   Glucose-Capillary 61 (*) 70 - 99 mg/dL  GLUCOSE, CAPILLARY      Result Value Range   Glucose-Capillary 194 (*) 70 - 99 mg/dL  GLUCOSE, CAPILLARY      Result Value Range   Glucose-Capillary 111 (*) 70 - 99 mg/dL  GLUCOSE, CAPILLARY      Result Value Range   Glucose-Capillary 98  70 - 99 mg/dL  GLUCOSE, CAPILLARY      Result Value Range   Glucose-Capillary 82  70 - 99 mg/dL   Comment 1 Documented in Chart    GLUCOSE, CAPILLARY      Result Value Range   Glucose-Capillary 185 (*) 70 - 99 mg/dL   Comment 1 Documented in Chart    GLUCOSE, CAPILLARY      Result Value Range   Glucose-Capillary 255 (*) 70 - 99 mg/dL   Comment 1 Documented in Chart    GLUCOSE, CAPILLARY      Result Value Range   Glucose-Capillary 165 (*) 70 - 99 mg/dL  GLUCOSE, CAPILLARY      Result Value Range   Glucose-Capillary 89  70 - 99 mg/dL   Dg Chest 2 View  6/96/2952   *RADIOLOGY REPORT*  Clinical Data: Chest tightness, cough, congestion, history of COPD and CHF  CHEST - 2 VIEW  Comparison: 05/23/2013; 05/20/2013; chest CT - 05/21/2013  Findings: Grossly unchanged enlarged cardiac silhouette post median sternotomy and aortic valve replacement. The lungs remain hyperexpanded with mild diffuse thickening of the interstitium.   No significant change in bilateral mid and left lower lung coarse reticular opacities.  Unchanged consolidative left basilar left retrocardiac opacity.  No new focal airspace opacities.  No definite pleural effusion or pneumothorax.  Grossly unchanged bones including stigmata of DISH within the thoracic spine.  IMPRESSION: Grossly unchanged findings suggestive of multifocal infection superimposed on advanced emphysematous change.  A follow-up chest radiograph in 4 to 6 weeks after treatment is recommended to ensure resolution.   Original Report Authenticated By: Tacey Ruiz, MD   Dg Chest 2 View  05/22/2013   *RADIOLOGY REPORT*  Clinical Data: Follow-up edema.  Question left effusion.  CHEST - 2 VIEW  Comparison: CT chest 05/21/2013 and chest radiograph 05/20/2013.  Findings: Trachea is midline.  Heart is at the upper limits of normal in size.  There is bibasilar dependent air space disease superimposed on emphysema.  Moderate left pleural effusion.  Tiny right pleural effusion.  Flowing anterior osteophytosis is seen in the thoracic spine.  IMPRESSION:  1.  Bibasilar air space disease, as before, indicative of multilobar pneumonia.  Edema not excluded. 2.  Bilateral pleural effusions, left greater than right.   Original Report Authenticated By: Leanna Battles, M.D.   Dg Chest 2 View  05/16/2013   *RADIOLOGY REPORT*  Clinical Data: Follow up of "infiltrates."  Shortness of breath and weakness.  CHEST - 2 VIEW  Comparison: 05/14/2013  Findings: Prior median sternotomy.  Artifact degradation posteriorly on the lateral view.  Cardiomegaly accentuated by [facility] portable technique.  Probable small left pleural effusion versus pleural thickening. No pneumothorax. Lower lobe predominant interstitial and airspace opacities are minimally improved.  Bilateral.  IMPRESSION: Minimal improvement in bilateral interstitial and airspace opacities.  Infection versus pulmonary edema.   Original Report Authenticated By: Jeronimo Greaves, M.D.   Ct Chest Wo Contrast  05/21/2013   *RADIOLOGY REPORT*  Clinical Data: Evaluate pulmonary infiltrates and mediastinal lymphadenopathy.  Persistent nonproductive cough.  CT CHEST WITHOUT CONTRAST  Technique:  Multidetector CT imaging of the chest was performed following the standard protocol without IV contrast.  Comparison: Chest CT 05/06/2013.  Findings:  Mediastinum: Heart size is enlarged with dilatation of the right atrium.  There is no significant pericardial fluid, thickening or pericardial calcification. There is atherosclerosis of the thoracic aorta, the great vessels of the mediastinum and the coronary arteries, including calcified atherosclerotic plaque in the left main, left anterior descending, left circumflex and right coronary arteries. Status post median sternotomy for aortic valve replacement (there appears to be a stented bioprosthesis in position).  Ectasia of the proximal descending thoracic aorta and near the anastomotic site of the bioprosthetic valve measuring up to approximately 4.1 cm in diameter, similar to prior studies. No pathologically enlarged mediastinal or hilar lymph nodes. Please note that accurate exclusion of hilar adenopathy is limited on noncontrast CT scans.  Numerous borderline enlarged mediastinal lymph nodes are presumably reactive, including a cluster of subcarinal nodes which appear matted together.  Esophagus is unremarkable in appearance.  Lungs/Pleura: There continues to be extensive confluent air space consolidation and numerous air bronchograms throughout the left lower  lobe, compatible with persistent left lower lobe pneumonia. There is now an enlarging moderate left-sided parapneumonic pleural effusion which appears to layer dependently.  Scattered throughout the lungs bilaterally there are increasing areas of patchy ground glass attenuation and septal thickening, concerning for endobronchial spread of infection, most severe in the right lower lobe  posteriorly and in the right middle lobe.  The background of moderate centrilobular emphysema and mild diffuse bronchial wall thickening is similar to the prior study.  Frothy secretions are noted in the distal trachea and proximal right main stem bronchi bilaterally (left greater than right).  Upper Abdomen: Low-attenuation bilateral renal lesions similar to prior studies.  Status post cholecystectomy.  Musculoskeletal: There are no aggressive appearing lytic or blastic lesions noted in the visualized portions of the skeleton. Sternotomy wires.  IMPRESSION: 1.  Overall, there is worsening aeration throughout the lungs bilaterally.  Persistent air space consolidation in the left lower lobe is compatible with a residual lobar pneumonia.  There is evidence of endobronchial spread of infection throughout the remainder of the lungs, most severe in the right middle lobe and right lower lobe. 2.  Enlarging moderate-sized left-sided parapneumonic pleural effusion, which appears to layer dependently at this time. 3. Atherosclerosis, including left main and three-vessel coronary artery disease.   Assessment for potential risk factor modification, dietary therapy or pharmacologic therapy may be warranted, if clinically indicated. 4.  Status post median sternotomy for a bioprosthetic aortic valve replacement. 5.  Mild ectasia of the descending thoracic aorta (4.1 cm in diameter) is unchanged. 6.  Additional incidental findings, similar to prior studies, as above.   Original Report Authenticated By: Trudie Reed, M.D.   Dg Chest Port 1 View  05/23/2013   *RADIOLOGY REPORT*  Clinical Data: Follow up pneumonia.  PORTABLE CHEST - 1 VIEW 05/23/2013 0622 hours:  Comparison: Portable chest x-ray yesterday and 05/20/2013 and two- view chest x-ray yesterday.  CT chest 05/21/2013.  Findings: Prior sternotomy for CABG.  Cardiac silhouette enlarged but stable.  Emphysematous changes in the upper lobes.  Improvement in the patchy  airspace opacities throughout both lungs, most confluent in the left lower lobe, though moderate opacity persists. Stable left pleural effusion.  No new pulmonary parenchymal abnormalities.  IMPRESSION: Improving pneumonia in both lungs, though moderate patchy airspace opacities persist.  Stable small left pleural effusion.  No new abnormalities.   Original Report Authenticated By: Hulan Saas, M.D.   Dg Chest Port 1 View  05/22/2013   *RADIOLOGY REPORT*  Clinical Data: Post thoracentesis  PORTABLE CHEST - 1 VIEW  Comparison: Portable exam 1155 hours compared to 0817 hours  Findings: Upper normal heart size post median sternotomy and AVR. Atherogenic calcification aorta. Bibasilar airspace infiltrates most consistent with pneumonia, greater on left. Small left pleural effusion, decreased. Upper lungs grossly clear. No definite pneumothorax. Bones appear demineralized.  IMPRESSION: No pneumothorax. Bibasilar airspace infiltrates most consistent with pneumonia, greater on left, little changed.   Original Report Authenticated By: Ulyses Southward, M.D.   Dg Chest Portable 1 View  05/20/2013   *RADIOLOGY REPORT*  Clinical Data: Follow up of pulmonary edema.  Shortness of breath. Chest tightness.  Cough.  Nonsmoker.  PORTABLE CHEST - 1 VIEW  Comparison: 05/19/2013  Findings: Prior median sternotomy. Cardiomegaly accentuated by [facility] portable technique.  Possible small left pleural effusion. No pneumothorax.  Lower lobe predominant, left greater than right interstitial and airspace opacities are not significantly changed.  IMPRESSION: No significant change since the prior exam.  Left greater than  right interstitial and airspace opacities. Infection versus alveolar pulmonary edema.  Possible small left pleural effusion.   Original Report Authenticated By: Jeronimo Greaves, M.D.   Dg Chest Portable 1 View  05/19/2013   *RADIOLOGY REPORT*  Clinical Data: Follow up infiltrates.  PORTABLE CHEST - 1 VIEW  Comparison: 05/18/2013.   Findings: Stable cardiac enlargement.  Bilateral interstitial and airspace process not significantly changed.  It could reflect asymmetric edema but is more likely bilateral infiltrates. Probable small left effusion.  IMPRESSION: Persistent bilateral infiltrates.   Original Report Authenticated By: Rudie Meyer, M.D.   Dg Chest Port 1 View  05/18/2013   *RADIOLOGY REPORT*  Clinical Data: Pulmonary infiltrates.  PORTABLE CHEST - 1 VIEW  Comparison: 05/16/2013.  Findings: Persistent bilateral lower lobe infiltrates.  No definite pleural effusion or pneumothorax.  IMPRESSION: Persistent bilateral infiltrates.   Original Report Authenticated By: Rudie Meyer, M.D.   Dg Chest Port 1 View  05/14/2013   *RADIOLOGY REPORT*  Clinical Data: Respiratory failure, follow-up  PORTABLE CHEST - 1 VIEW  Comparison: Portable chest x-ray of 05/13/2013  Findings: The endotracheal tube appears to have been removed. There is little change in aeration.  Patchy airspace disease remains left greater than right with probable left effusion. Cardiomegaly is stable. The left central venous line remains.  IMPRESSION: Endotracheal tube removed.  No change in aeration.  Persistent patchy opacities bilaterally.   Original Report Authenticated By: Dwyane Dee, M.D.   Dg Chest Port 1 View  05/13/2013   *RADIOLOGY REPORT*  Clinical Data: Respiratory failure  PORTABLE CHEST - 1 VIEW  Comparison: 05/12/2013  Findings: 0510 hours.  Endotracheal tube tip is approximately 3.2 cm above the base of the carina. The NG tube passes into the stomach although the distal tip position is not included on the film.  Left IJ central line tip overlies the proximal SVC. Cardiopericardial silhouette is at upper limits of normal for size. The by basilar airspace disease persists without substantial change. Telemetry leads overlie the chest.  IMPRESSION: No appreciable interval change in the bibasilar airspace disease.   Original Report Authenticated By: Kennith Center, M.D.   Dg Chest Port 1 View  05/12/2013   *RADIOLOGY REPORT*  Clinical Data: Central line placement  PORTABLE CHEST - 1 VIEW  Comparison: 1320 hours  Findings: New left internal jugular vein central venous catheter has been placed with its tip in the mid SVC.  No pneumothorax. Stable bilateral airspace disease.  Stable endotracheal and NG tubes.  Stable cardiac silhouette.  IMPRESSION: Left internal jugular vein central venous catheter placement with its tip in the mid SVC and no pneumothorax.  Stable bilateral airspace disease.   Original Report Authenticated By: Jolaine Click, M.D.   Portable Chest Xray  05/12/2013   *RADIOLOGY REPORT*  Clinical Data: Status post intubation.  PORTABLE CHEST - 1 VIEW  Comparison: Portable chest 05/12/2013.  Findings: The patient has a new endotracheal tube in place with the tip in good position approximately 4 cm above the carina.  NG tube courses into the stomach and below the inferior margin of film. Extensive bilateral airspace disease is again seen.  No pneumothorax.  IMPRESSION:  1.  ET tube and NG tube in good position. 2.  Extensive bilateral airspace disease.   Original Report Authenticated By: Holley Dexter, M.D.   Dg Chest Port 1 View  05/12/2013   *RADIOLOGY REPORT*  Clinical Data: Respiratory failure  PORTABLE CHEST - 1 VIEW  Comparison: 05/11/2013  Findings: Bilateral airspace opacities,  left greater than right, similar to mildly increased.  Aortic atherosclerosis.  Heart size mildly prominent.  The left heart margin is partially obscured by the consolidation.  Left pleural effusion not excluded.  No pneumothorax.  Status post median sternotomy.  Osteopenia.  No acute osseous finding.  IMPRESSION: Bilateral airspace opacities are similar to mildly increased in the interval.  Differential includes multifocal pneumonia, aspiration and asymmetric edema.   Original Report Authenticated By: Jearld Lesch, M.D.   Dg Chest Port 1 View  05/11/2013    *RADIOLOGY REPORT*  Clinical Data: Evaluate endotracheal tube.  PORTABLE CHEST - 1 VIEW  Comparison: 05/09/2013  Findings: Prior median sternotomy.  Borderline cardiomegaly. Possible small left pleural effusion, similar. No pneumothorax. Slight worsening left lower lobe and infrahilar airspace disease. New or increased right lower lobe airspace disease.  IMPRESSION: Worsened aeration, with increased left greater than right airspace disease.  Considerations include multifocal infection/aspiration or alveolar pulmonary edema.   Original Report Authenticated By: Jeronimo Greaves, M.D.   Dg Chest Port 1 View  05/09/2013   *RADIOLOGY REPORT*  Clinical Data: Respiratory failure.  PORTABLE CHEST - 1 VIEW  Comparison: 05/08/2013  Findings: Stable left lower lung infiltrate.  No pulmonary edema is identified.  Right lung expansion improved.  Stable cardiomegaly.  IMPRESSION: Stable left lower lung infiltrate.  Improved aeration of the right lung.   Original Report Authenticated By: Irish Lack, M.D.    Medications - No data to display Results for orders placed during the hospital encounter of 06/07/13  PRO B NATRIURETIC PEPTIDE      Result Value Range   Pro B Natriuretic peptide (BNP) 2121.0 (*) 0 - 450 pg/mL  TROPONIN I      Result Value Range   Troponin I <0.30  <0.30 ng/mL  CBC WITH DIFFERENTIAL      Result Value Range   WBC 9.5  4.0 - 10.5 K/uL   RBC 2.89 (*) 4.22 - 5.81 MIL/uL   Hemoglobin 8.4 (*) 13.0 - 17.0 g/dL   HCT 14.7 (*) 82.9 - 56.2 %   MCV 93.8  78.0 - 100.0 fL   MCH 29.1  26.0 - 34.0 pg   MCHC 31.0  30.0 - 36.0 g/dL   RDW 13.0 (*) 86.5 - 78.4 %   Platelets 170  150 - 400 K/uL   Neutrophils Relative % 62  43 - 77 %   Lymphocytes Relative 10 (*) 12 - 46 %   Monocytes Relative 28 (*) 3 - 12 %   Eosinophils Relative 0  0 - 5 %   Basophils Relative 0  0 - 1 %   Neutro Abs 5.8  1.7 - 7.7 K/uL   Lymphs Abs 1.0  0.7 - 4.0 K/uL   Monocytes Absolute 2.7 (*) 0.1 - 1.0 K/uL   Eosinophils  Absolute 0.0  0.0 - 0.7 K/uL   Basophils Absolute 0.0  0.0 - 0.1 K/uL   RBC Morphology POLYCHROMASIA PRESENT     WBC Morphology MILD LEFT SHIFT (1-5% METAS, OCC MYELO, OCC BANDS)    COMPREHENSIVE METABOLIC PANEL      Result Value Range   Sodium 133 (*) 135 - 145 mEq/L   Potassium 4.5  3.5 - 5.1 mEq/L   Chloride 92 (*) 96 - 112 mEq/L   CO2 33 (*) 19 - 32 mEq/L   Glucose, Bld 233 (*) 70 - 99 mg/dL   BUN 23  6 - 23 mg/dL   Creatinine, Ser 6.96  0.50 -  1.35 mg/dL   Calcium 8.4  8.4 - 14.7 mg/dL   Total Protein 7.9  6.0 - 8.3 g/dL   Albumin 2.4 (*) 3.5 - 5.2 g/dL   AST 15  0 - 37 U/L   ALT 13  0 - 53 U/L   Alkaline Phosphatase 95  39 - 117 U/L   Total Bilirubin 1.2  0.3 - 1.2 mg/dL   GFR calc non Af Amer 68 (*) >90 mL/min   GFR calc Af Amer 79 (*) >90 mL/min   Dg Chest 2 View  05/25/2013   *RADIOLOGY REPORT*  Clinical Data: Chest tightness, cough, congestion, history of COPD and CHF  CHEST - 2 VIEW  Comparison: 05/23/2013; 05/20/2013; chest CT - 05/21/2013  Findings: Grossly unchanged enlarged cardiac silhouette post median sternotomy and aortic valve replacement. The lungs remain hyperexpanded with mild diffuse thickening of the interstitium.  No significant change in bilateral mid and left lower lung coarse reticular opacities.  Unchanged consolidative left basilar left retrocardiac opacity.  No new focal airspace opacities.  No definite pleural effusion or pneumothorax.  Grossly unchanged bones including stigmata of DISH within the thoracic spine.  IMPRESSION: Grossly unchanged findings suggestive of multifocal infection superimposed on advanced emphysematous change.  A follow-up chest radiograph in 4 to 6 weeks after treatment is recommended to ensure resolution.   Original Report Authenticated By: Tacey Ruiz, MD   Dg Chest 2 View  05/22/2013   *RADIOLOGY REPORT*  Clinical Data: Follow-up edema.  Question left effusion.  CHEST - 2 VIEW  Comparison: CT chest 05/21/2013 and chest radiograph  05/20/2013.  Findings: Trachea is midline.  Heart is at the upper limits of normal in size.  There is bibasilar dependent air space disease superimposed on emphysema.  Moderate left pleural effusion.  Tiny right pleural effusion.  Flowing anterior osteophytosis is seen in the thoracic spine.  IMPRESSION:  1.  Bibasilar air space disease, as before, indicative of multilobar pneumonia.  Edema not excluded. 2.  Bilateral pleural effusions, left greater than right.   Original Report Authenticated By: Leanna Battles, M.D.   Dg Chest 2 View  05/16/2013   *RADIOLOGY REPORT*  Clinical Data: Follow up of "infiltrates."  Shortness of breath and weakness.  CHEST - 2 VIEW  Comparison: 05/14/2013  Findings: Prior median sternotomy.  Artifact degradation posteriorly on the lateral view.  Cardiomegaly accentuated by AP portable technique.  Probable small left pleural effusion versus pleural thickening. No pneumothorax. Lower lobe predominant interstitial and airspace opacities are minimally improved.  Bilateral.  IMPRESSION: Minimal improvement in bilateral interstitial and airspace opacities.  Infection versus pulmonary edema.   Original Report Authenticated By: Jeronimo Greaves, M.D.   Ct Chest Wo Contrast  05/21/2013   *RADIOLOGY REPORT*  Clinical Data: Evaluate pulmonary infiltrates and mediastinal lymphadenopathy.  Persistent nonproductive cough.  CT CHEST WITHOUT CONTRAST  Technique:  Multidetector CT imaging of the chest was performed following the standard protocol without IV contrast.  Comparison: Chest CT 05/06/2013.  Findings:  Mediastinum: Heart size is enlarged with dilatation of the right atrium.  There is no significant pericardial fluid, thickening or pericardial calcification. There is atherosclerosis of the thoracic aorta, the great vessels of the mediastinum and the coronary arteries, including calcified atherosclerotic plaque in the left main, left anterior descending, left circumflex and right coronary  arteries. Status post median sternotomy for aortic valve replacement (there appears to be a stented bioprosthesis in position).  Ectasia of the proximal descending thoracic aorta and near the  anastomotic site of the bioprosthetic valve measuring up to approximately 4.1 cm in diameter, similar to prior studies. No pathologically enlarged mediastinal or hilar lymph nodes. Please note that accurate exclusion of hilar adenopathy is limited on noncontrast CT scans.  Numerous borderline enlarged mediastinal lymph nodes are presumably reactive, including a cluster of subcarinal nodes which appear matted together.  Esophagus is unremarkable in appearance.  Lungs/Pleura: There continues to be extensive confluent air space consolidation and numerous air bronchograms throughout the left lower lobe, compatible with persistent left lower lobe pneumonia. There is now an enlarging moderate left-sided parapneumonic pleural effusion which appears to layer dependently.  Scattered throughout the lungs bilaterally there are increasing areas of patchy ground glass attenuation and septal thickening, concerning for endobronchial spread of infection, most severe in the right lower lobe posteriorly and in the right middle lobe.  The background of moderate centrilobular emphysema and mild diffuse bronchial wall thickening is similar to the prior study.  Frothy secretions are noted in the distal trachea and proximal right main stem bronchi bilaterally (left greater than right).  Upper Abdomen: Low-attenuation bilateral renal lesions similar to prior studies.  Status post cholecystectomy.  Musculoskeletal: There are no aggressive appearing lytic or blastic lesions noted in the visualized portions of the skeleton. Sternotomy wires.  IMPRESSION: 1.  Overall, there is worsening aeration throughout the lungs bilaterally.  Persistent air space consolidation in the left lower lobe is compatible with a residual lobar pneumonia.  There is evidence of  endobronchial spread of infection throughout the remainder of the lungs, most severe in the right middle lobe and right lower lobe. 2.  Enlarging moderate-sized left-sided parapneumonic pleural effusion, which appears to layer dependently at this time. 3. Atherosclerosis, including left main and three-vessel coronary artery disease.   Assessment for potential risk factor modification, dietary therapy or pharmacologic therapy may be warranted, if clinically indicated. 4.  Status post median sternotomy for a bioprosthetic aortic valve replacement. 5.  Mild ectasia of the descending thoracic aorta (4.1 cm in diameter) is unchanged. 6.  Additional incidental findings, similar to prior studies, as above.   Original Report Authenticated By: Trudie Reed, M.D.   Dg Chest Port 1 View  06/07/2013   *RADIOLOGY REPORT*  Clinical Data: Tachycardia and shortness of breath.  PORTABLE CHEST - 1 VIEW  Comparison: Chest x-ray 05/25/2013.  Findings: Emphysematous changes again noted.  Markedly worsened opacity at the base of the left hemithorax compatible with increasing atelectasis and/or consolidation and moderate to large left-sided pleural effusion.  Patchy multifocal interstitial and airspace opacities are also noted throughout the mid to lower lungs bilaterally, and have also increased slightly compared to the prior examination.  No evidence of pulmonary edema.  Heart size is upper limits of normal. The patient is rotated to the left on today's exam, resulting in distortion of the mediastinal contours and reduced diagnostic sensitivity and specificity for mediastinal pathology.  Atherosclerosis in the thoracic aorta.  Status post median sternotomy for aortic valve replacement (a stented bioprosthesis is noted).  IMPRESSION: 1.  Worsening multifocal interstitial and airspace disease throughout the mid and lower lungs bilaterally, particularly at the left base where there is also an enlarging moderate to large left pleural  effusion.  Findings are concerning for old worsening multilobar pneumonia.   Original Report Authenticated By: Trudie Reed, M.D.   Dg Chest Port 1 View  05/23/2013   *RADIOLOGY REPORT*  Clinical Data: Follow up pneumonia.  PORTABLE CHEST - 1 VIEW 05/23/2013 0622 hours:  Comparison: Portable chest x-ray yesterday and 05/20/2013 and two- view chest x-ray yesterday.  CT chest 05/21/2013.  Findings: Prior sternotomy for CABG.  Cardiac silhouette enlarged but stable.  Emphysematous changes in the upper lobes.  Improvement in the patchy airspace opacities throughout both lungs, most confluent in the left lower lobe, though moderate opacity persists. Stable left pleural effusion.  No new pulmonary parenchymal abnormalities.  IMPRESSION: Improving pneumonia in both lungs, though moderate patchy airspace opacities persist.  Stable small left pleural effusion.  No new abnormalities.   Original Report Authenticated By: Hulan Saas, M.D.   Dg Chest Port 1 View  05/22/2013   *RADIOLOGY REPORT*  Clinical Data: Post thoracentesis  PORTABLE CHEST - 1 VIEW  Comparison: Portable exam 1155 hours compared to 0817 hours  Findings: Upper normal heart size post median sternotomy and AVR. Atherogenic calcification aorta. Bibasilar airspace infiltrates most consistent with pneumonia, greater on left. Small left pleural effusion, decreased. Upper lungs grossly clear. No definite pneumothorax. Bones appear demineralized.  IMPRESSION: No pneumothorax. Bibasilar airspace infiltrates most consistent with pneumonia, greater on left, little changed.   Original Report Authenticated By: Ulyses Southward, M.D.   Dg Chest Portable 1 View  05/20/2013   *RADIOLOGY REPORT*  Clinical Data: Follow up of pulmonary edema.  Shortness of breath. Chest tightness.  Cough.  Nonsmoker.  PORTABLE CHEST - 1 VIEW  Comparison: 05/19/2013  Findings: Prior median sternotomy. Cardiomegaly accentuated by AP portable technique.  Possible small left pleural  effusion. No pneumothorax.  Lower lobe predominant, left greater than right interstitial and airspace opacities are not significantly changed.  IMPRESSION: No significant change since the prior exam.  Left greater than right interstitial and airspace opacities. Infection versus alveolar pulmonary edema.  Possible small left pleural effusion.   Original Report Authenticated By: Jeronimo Greaves, M.D.   Dg Chest Portable 1 View  05/19/2013   *RADIOLOGY REPORT*  Clinical Data: Follow up infiltrates.  PORTABLE CHEST - 1 VIEW  Comparison: 05/18/2013.  Findings: Stable cardiac enlargement.  Bilateral interstitial and airspace process not significantly changed.  It could reflect asymmetric edema but is more likely bilateral infiltrates. Probable small left effusion.  IMPRESSION: Persistent bilateral infiltrates.   Original Report Authenticated By: Rudie Meyer, M.D.   Dg Chest Port 1 View  05/18/2013   *RADIOLOGY REPORT*  Clinical Data: Pulmonary infiltrates.  PORTABLE CHEST - 1 VIEW  Comparison: 05/16/2013.  Findings: Persistent bilateral lower lobe infiltrates.  No definite pleural effusion or pneumothorax.  IMPRESSION: Persistent bilateral infiltrates.   Original Report Authenticated By: Rudie Meyer, M.D.   Dg Chest Port 1 View  05/14/2013   *RADIOLOGY REPORT*  Clinical Data: Respiratory failure, follow-up  PORTABLE CHEST - 1 VIEW  Comparison: Portable chest x-ray of 05/13/2013  Findings: The endotracheal tube appears to have been removed. There is little change in aeration.  Patchy airspace disease remains left greater than right with probable left effusion. Cardiomegaly is stable. The left central venous line remains.  IMPRESSION: Endotracheal tube removed.  No change in aeration.  Persistent patchy opacities bilaterally.   Original Report Authenticated By: Dwyane Dee, M.D.   Dg Chest Port 1 View  05/13/2013   *RADIOLOGY REPORT*  Clinical Data: Respiratory failure  PORTABLE CHEST - 1 VIEW  Comparison:  05/12/2013  Findings: 0510 hours.  Endotracheal tube tip is approximately 3.2 cm above the base of the carina. The NG tube passes into the stomach although the distal tip position is not included on the film.  Left IJ central  line tip overlies the proximal SVC. Cardiopericardial silhouette is at upper limits of normal for size. The by basilar airspace disease persists without substantial change. Telemetry leads overlie the chest.  IMPRESSION: No appreciable interval change in the bibasilar airspace disease.   Original Report Authenticated By: Kennith Center, M.D.   Dg Chest Port 1 View  05/12/2013   *RADIOLOGY REPORT*  Clinical Data: Central line placement  PORTABLE CHEST - 1 VIEW  Comparison: 1320 hours  Findings: New left internal jugular vein central venous catheter has been placed with its tip in the mid SVC.  No pneumothorax. Stable bilateral airspace disease.  Stable endotracheal and NG tubes.  Stable cardiac silhouette.  IMPRESSION: Left internal jugular vein central venous catheter placement with its tip in the mid SVC and no pneumothorax.  Stable bilateral airspace disease.   Original Report Authenticated By: Jolaine Click, M.D.   Portable Chest Xray  05/12/2013   *RADIOLOGY REPORT*  Clinical Data: Status post intubation.  PORTABLE CHEST - 1 VIEW  Comparison: Portable chest 05/12/2013.  Findings: The patient has a new endotracheal tube in place with the tip in good position approximately 4 cm above the carina.  NG tube courses into the stomach and below the inferior margin of film. Extensive bilateral airspace disease is again seen.  No pneumothorax.  IMPRESSION:  1.  ET tube and NG tube in good position. 2.  Extensive bilateral airspace disease.   Original Report Authenticated By: Holley Dexter, M.D.   Dg Chest Port 1 View  05/12/2013   *RADIOLOGY REPORT*  Clinical Data: Respiratory failure  PORTABLE CHEST - 1 VIEW  Comparison: 05/11/2013  Findings: Bilateral airspace opacities, left greater than  right, similar to mildly increased.  Aortic atherosclerosis.  Heart size mildly prominent.  The left heart margin is partially obscured by the consolidation.  Left pleural effusion not excluded.  No pneumothorax.  Status post median sternotomy.  Osteopenia.  No acute osseous finding.  IMPRESSION: Bilateral airspace opacities are similar to mildly increased in the interval.  Differential includes multifocal pneumonia, aspiration and asymmetric edema.   Original Report Authenticated By: Jearld Lesch, M.D.   Dg Chest Port 1 View  05/11/2013   *RADIOLOGY REPORT*  Clinical Data: Evaluate endotracheal tube.  PORTABLE CHEST - 1 VIEW  Comparison: 05/09/2013  Findings: Prior median sternotomy.  Borderline cardiomegaly. Possible small left pleural effusion, similar. No pneumothorax. Slight worsening left lower lobe and infrahilar airspace disease. New or increased right lower lobe airspace disease.  IMPRESSION: Worsened aeration, with increased left greater than right airspace disease.  Considerations include multifocal infection/aspiration or alveolar pulmonary edema.   Original Report Authenticated By: Jeronimo Greaves, M.D.   Dg Chest Port 1 View  05/09/2013   *RADIOLOGY REPORT*  Clinical Data: Respiratory failure.  PORTABLE CHEST - 1 VIEW  Comparison: 05/08/2013  Findings: Stable left lower lung infiltrate.  No pulmonary edema is identified.  Right lung expansion improved.  Stable cardiomegaly.  IMPRESSION: Stable left lower lung infiltrate.  Improved aeration of the right lung.   Original Report Authenticated By: Irish Lack, M.D.    Date: 06/07/2013  Rate: 96  Rhythm: atrial flutter  QRS Axis: normal  Intervals: normal  ST/T Wave abnormalities: nonspecific ST/T changes  Conduction Disutrbances:none  Narrative Interpretation:   Old EKG Reviewed: unchanged No significant change in EKG compared to may 20 06/19/2013   Impression: HCAP   MDM  We'll discuss with the hospitalist. Patient is a patient  of Dr. [name]. Chest x-ray today  shows worsening airspace disease compared to chest x-ray on May 26. Patient sent over from the and Center for worsening shortness of breath and intermittent periods of tachycardia. EKG is unchanged in the past suspect that when he gets winded that increases his heart rate. Patient's white blood cell count is not elevated no significant fever patient is on his baseline amount of oxygen but is still short of breath he gets more short of breath with exertion. Arterial blood gas to further evaluate is pending.  Per the hospital acquired pneumonia. Patient will be started on protocol antibiotics blood cultures will be done first.       I personally performed the services described in this documentation, which was scribed in my presence. The recorded information has been reviewed and is accurate.    Shelda Jakes, MD 06/07/13 [phone]

## 2013-06-07 NOTE — ED Notes (Signed)
Increased heart rate and sob. Pt seen by Dr. [name] on Sunday and Cardizem was increased on Wednesday. HR remains elevated at 116 and increasing to 130s with minimal exertion. Lasix was also recently increased and swelling to lower extremities has improved per staff at [facility], where pt resides.

## 2013-06-08 LAB — GLUCOSE, CAPILLARY
Glucose-Capillary: 61 mg/dL — ABNORMAL LOW (ref 70–99)
Glucose-Capillary: 138 mg/dL — ABNORMAL HIGH (ref 70–99)
Glucose-Capillary: 121 mg/dL — ABNORMAL HIGH (ref 70–99)

## 2013-06-08 LAB — BASIC METABOLIC PANEL
CO2: 33 mEq/L — ABNORMAL HIGH (ref 19–32)
Calcium: 8.2 mg/dL — ABNORMAL LOW (ref 8.4–10.5)
Creatinine, Ser: 1.01 mg/dL (ref 0.50–1.35)
GFR calc Af Amer: 82 mL/min — ABNORMAL LOW (ref >90)
GFR calc non Af Amer: 71 mL/min — ABNORMAL LOW (ref >90)

## 2013-06-08 LAB — CBC
HCT: 26.6 % — ABNORMAL LOW (ref 39.0–52.0)
Hemoglobin: 8.1 g/dL — ABNORMAL LOW (ref 13.0–17.0)
MCHC: 30.5 g/dL (ref 30.0–36.0)
RBC: 2.82 MIL/uL — ABNORMAL LOW (ref 4.22–5.81)
WBC: 7.9 10*3/uL (ref 4.0–10.5)

## 2013-06-08 LAB — FUNGUS CULTURE W SMEAR

## 2013-06-08 MED ORDER — INSULIN GLARGINE 100 UNIT/ML ~~LOC~~ SOLN
SUBCUTANEOUS | Status: AC
  Filled 2013-06-08: qty 10

## 2013-06-08 MED ORDER — VANCOMYCIN HCL IN DEXTROSE 750-5 MG/150ML-% IV SOLN
INTRAVENOUS | Status: AC
  Filled 2013-06-08: qty 150

## 2013-06-08 MED ORDER — LEVALBUTEROL HCL 0.63 MG/3ML IN NEBU
0.6300 mg | INHALATION_SOLUTION | Freq: Four times a day (QID) | RESPIRATORY_TRACT | Status: DC
  Administered 2013-06-08 – 2013-06-22 (×55): 0.63 mg via RESPIRATORY_TRACT
  Filled 2013-06-08 (×54): qty 3

## 2013-06-08 NOTE — Progress Notes (Signed)
Hypoglycemic Event  CBG: 61  Treatment: 15 GM carbohydrate snack  Symptoms: None  Follow-up CBG: Time:0818 CBG Result:93  Possible Reasons for Event: Unknown  Comments/MD notified: Dr. [name] notified. New orders to d/c Glyburide    Kevron Patella  Remember to initiate Hypoglycemia Order Set & complete

## 2013-06-08 NOTE — Care Management Note (Unsigned)
    Page 1 of 2   06/19/2013     11:16:35 AM   CARE MANAGEMENT NOTE 06/19/2013  Patient:  [name] [name], [name] [name]   Account Number:  [id number]  Date Initiated:  06/08/2013  Documentation initiated by:  Anibal Henderson  Subjective/Objective Assessment:   Admitted with PNA, from the [facility], and was a recent D/C from [facility]. Pt and spouse would like for him to go home, rather than back to SNF, if he improves enough to be able to get around in the house and yard "a little" as before.     Action/Plan:   He only used O2 at night, previously. PT is working with pt for strengthening. They use [facility] for HH. They are willing for him to return to [facility] if needed. CSW following   Anticipated DC Date:  06/21/2013   Anticipated DC Plan:  HOME W HOME HEALTH SERVICES  In-house referral  Clinical Social Worker      DC Associate Professor  CM consult      [facility] Choice  HOME HEALTH   Choice offered to / List presented to:  C-1 Patient   DME arranged  HOSPITAL BED  WHEELCHAIR - MANUAL  OXYGEN      DME agency  [facility]  [facility]     HH arranged  HH-1 RN  HH-10 DISEASE MANAGEMENT  HH-2 PT  HH-4 NURSE'S AIDE  HH-6 SOCIAL WORKER      HH agency  [facility]   Status of service:  In process, will continue to follow Medicare Important Message given?   (If response is "NO", the following Medicare IM given date fields will be blank) Date Medicare IM given:   Date Additional Medicare IM given:    Discharge Disposition:    Per UR Regulation:  Reviewed for med. necessity/level of care/duration of stay  If discussed at Long Length of Stay Meetings, dates discussed:   06/16/2013  06/18/2013    Comments:  06/19/13 1100 Kimberlyann Hollar RN BSN CM [facility] to [employer] RN PT [place] to provide O2 (instructions left for [employer] RN), [facility] to have Hospital bed and [facility] delivered to home prior to pt discharging. May need EMS transport.  06/18/13 Rosemary Holms RN BSN  CM Doing better. Hope to DC over weekend to home. DME set up and [facility] aware of pending DC  06/15/13 1500 Anibal Henderson RN Plans are for D/C tomorrow if remains stable. DME and HH set up 06/08/13 1315 Anibal Henderson RN

## 2013-06-08 NOTE — Clinical Social Work Psychosocial (Signed)
Clinical Social Work Department BRIEF PSYCHOSOCIAL ASSESSMENT 06/08/2013  Patient:  [name] [name], [name] [name]     Account Number:  [id number]     Admit date:  06/07/2013  Clinical Social Worker:  Nancie Neas  Date/Time:  06/08/2013 11:20 AM  Referred by:  Physician  Date Referred:  06/08/2013 Referred for  SNF Placement   Other Referral:   Interview type:  Patient Other interview type:    PSYCHOSOCIAL DATA Living Status:  FACILITY Admitted from facility:  [facility] Level of care:  Skilled Nursing Facility Primary support name:  Reba Primary support relationship to patient:  SPOUSE Degree of support available:   supportive per pt    CURRENT CONCERNS Current Concerns  Post-Acute Placement   Other Concerns:    SOCIAL WORK ASSESSMENT / PLAN CSW met with pt at bedside. Pt alert and oriented but very SOB while talking. He reports he has been a resident at [facility] for about 2 weeks. Pt was hospitalized for about 3 weeks prior to transfer to [facility] for rehab. Pt's wife was present in room earlier this morning and is involved and supportive per pt. Pt worked with PT today and his strength is good, but he is limited due to respiratory status. Pt states he would like to return home if possible at d/c from hospital. He had shared this with [facility] staff as well. CSW attempted to call his wife several times at pt's request but no answer. Per Lynnea Ferrier at [facility], okay for return if needed pending bed availability. Pt would also require reauthorization and PT recommending home health at this time.   Assessment/plan status:  Psychosocial Support/Ongoing Assessment of Needs Other assessment/ plan:   Information/referral to community resources:   [facility] if SNF is needed    PATIENT'S/FAMILY'S RESPONSE TO PLAN OF CARE: Pt unsure of d/c plan at this point. CSW will continue to follow and evaluate for skilled need if pt would like to return to SNF.       Derenda Fennel, [place] [phone]

## 2013-06-08 NOTE — Progress Notes (Signed)
UR Chart Review Completed  

## 2013-06-08 NOTE — Progress Notes (Signed)
Subjective: He was readmitted with multifocal pneumonia. He had normal cardiac ejection fraction about a month ago but also has some evidence that may be CHF. He's better this morning. He is still very weak. He has been hypoxic. He has been having rapid atrial fib and that's better. He says he's coughing.  Objective: Vital signs in last 24 hours: Temp:  [97.7 F (36.5 C)-98.5 F (36.9 C)] 97.7 F (36.5 C) (06/09 0500) Pulse Rate:  [76-102] 94 (06/09 0500) Resp:  [16-26] 16 (06/09 0500) BP: (113-146)/(59-86) 114/67 mmHg (06/09 0500) SpO2:  [87 %-97 %] 87 % (06/09 0659) Weight:  [74.56 kg (164 lb 6 oz)-76 kg (167 lb 8.8 oz)] 76 kg (167 lb 8.8 oz) (06/09 0500) Weight change:  Last BM Date: 06/06/13  Intake/Output from previous day: 06/08 0701 - 06/09 0700 In: -  Out: 950 [Urine:950]  PHYSICAL EXAM General appearance: alert, cooperative and moderate distress Resp: rhonchi bilaterally Cardio: irregularly irregular rhythm GI: soft, non-tender; bowel sounds normal; no masses,  no organomegaly Extremities: extremities normal, atraumatic, no cyanosis or edema  Lab Results:    Basic Metabolic Panel:  Recent Labs  16/10/96 1240 06/08/13 0429  NA 133* 141  K 4.5 4.1  CL 92* 100  CO2 33* 33*  GLUCOSE 233* 64*  BUN 23 19  CREATININE 1.04 1.01  CALCIUM 8.4 8.2*   Liver Function Tests:  Recent Labs  06/07/13 1240  AST 15  ALT 13  ALKPHOS 95  BILITOT 1.2  PROT 7.9  ALBUMIN 2.4*   No results found for this basename: LIPASE, AMYLASE,  in the last 72 hours No results found for this basename: AMMONIA,  in the last 72 hours CBC:  Recent Labs  06/07/13 1240 06/08/13 0429  WBC 9.5 7.9  NEUTROABS 5.8  --   HGB 8.4* 8.1*  HCT 27.1* 26.6*  MCV 93.8 94.3  PLT 170 169   Cardiac Enzymes:  Recent Labs  06/07/13 1240  TROPONINI <0.30   BNP:  Recent Labs  06/07/13 1240  PROBNP 2121.0*   D-Dimer: No results found for this basename: DDIMER,  in the last 72  hours CBG:  Recent Labs  06/06/13 1624 06/06/13 1919 06/07/13 0526 06/07/13 1800 06/07/13 1859 06/08/13 0736  GLUCAP 255* 165* 89 61* 113* 61*   Hemoglobin A1C: No results found for this basename: HGBA1C,  in the last 72 hours Fasting Lipid Panel: No results found for this basename: CHOL, HDL, LDLCALC, TRIG, CHOLHDL, LDLDIRECT,  in the last 72 hours Thyroid Function Tests: No results found for this basename: TSH, T4TOTAL, FREET4, T3FREE, THYROIDAB,  in the last 72 hours Anemia Panel: No results found for this basename: VITAMINB12, FOLATE, FERRITIN, TIBC, IRON, RETICCTPCT,  in the last 72 hours Coagulation: No results found for this basename: LABPROT, INR,  in the last 72 hours Urine Drug Screen: Drugs of Abuse  No results found for this basename: labopia, cocainscrnur, labbenz, amphetmu, thcu, labbarb    Alcohol Level: No results found for this basename: ETH,  in the last 72 hours Urinalysis: No results found for this basename: COLORURINE, APPERANCEUR, LABSPEC, PHURINE, GLUCOSEU, HGBUR, BILIRUBINUR, KETONESUR, PROTEINUR, UROBILINOGEN, NITRITE, LEUKOCYTESUR,  in the last 72 hours Misc. Labs:  ABGS  Recent Labs  06/07/13 1505  PHART 7.463*  PO2ART 61.2*  TCO2 30.9  HCO3 32.7*   CULTURES Recent Results (from the past 240 hour(s))  CULTURE, BLOOD (ROUTINE X 2)     Status: None   Collection Time    06/07/13  2:55 PM      Result Value Range Status   Specimen Description BLOOD LEFT ARM   Final   Special Requests BOTTLES DRAWN AEROBIC ONLY 6CC   Final   Culture PENDING   Incomplete   Report Status PENDING   Incomplete  CULTURE, BLOOD (ROUTINE X 2)     Status: None   Collection Time    06/07/13  3:20 PM      Result Value Range Status   Specimen Description BLOOD LEFT ARM   Final   Special Requests BOTTLES DRAWN AEROBIC AND ANAEROBIC 6CC   Final   Culture PENDING   Incomplete   Report Status PENDING   Incomplete  MRSA PCR SCREENING     Status: None   Collection  Time    06/07/13  8:01 PM      Result Value Range Status   MRSA by PCR NEGATIVE  NEGATIVE Final   Comment:            The GeneXpert MRSA Assay (FDA     approved for NASAL specimens     only), is one component of a     comprehensive MRSA colonization     surveillance program. It is not     intended to diagnose MRSA     infection nor to guide or     monitor treatment for     MRSA infections.   Studies/Results: Dg Chest Port 1 View  06/07/2013   *RADIOLOGY REPORT*  Clinical Data: Tachycardia and shortness of breath.  PORTABLE CHEST - 1 VIEW  Comparison: Chest x-ray 05/25/2013.  Findings: Emphysematous changes again noted.  Markedly worsened opacity at the base of the left hemithorax compatible with increasing atelectasis and/or consolidation and moderate to large left-sided pleural effusion.  Patchy multifocal interstitial and airspace opacities are also noted throughout the mid to lower lungs bilaterally, and have also increased slightly compared to the prior examination.  No evidence of pulmonary edema.  Heart size is upper limits of normal. The patient is rotated to the left on today's exam, resulting in distortion of the mediastinal contours and reduced diagnostic sensitivity and specificity for mediastinal pathology.  Atherosclerosis in the thoracic aorta.  Status post median sternotomy for aortic valve replacement (a stented bioprosthesis is noted).  IMPRESSION: 1.  Worsening multifocal interstitial and airspace disease throughout the mid and lower lungs bilaterally, particularly at the left base where there is also an enlarging moderate to large left pleural effusion.  Findings are concerning for old worsening multilobar pneumonia.   Original Report Authenticated By: Trudie Reed, M.D.    Medications:  Prior to Admission:  Prescriptions prior to admission  Medication Sig Dispense Refill  . aspirin 81 MG chewable tablet Chew 81 mg by mouth daily.      Marland Kitchen diltiazem (CARDIZEM CD) 300 MG 24  hr capsule Take 300 mg by mouth daily.      . feeding supplement (PRO-STAT SUGAR FREE 64) LIQD Take 30 mLs by mouth at bedtime.      . fluticasone (FLOVENT HFA) 110 MCG/ACT inhaler Inhale 1 puff into the lungs 2 (two) times daily.      . furosemide (LASIX) 80 MG tablet Take 80 mg by mouth daily.      Marland Kitchen glyBURIDE (DIABETA) 5 MG tablet Take 1 tablet (5 mg total) by mouth 2 (two) times daily with a meal.      . insulin aspart (NOVOLOG) 100 UNIT/ML injection Inject 3-15 Units into the skin 3 (three)  times daily with meals. Sliding scale as follows: 121-150=3 units 151-200=4 units 201-250=7 units 251-300=9 units 301-350=12 units 351-400=15 units      . insulin glargine (LANTUS) 100 UNIT/ML injection Inject 0.1 mLs (10 Units total) into the skin daily.  10 mL  12  . ipratropium-albuterol (DUONEB) 0.5-2.5 (3) MG/3ML SOLN Take 3 mLs by nebulization 4 (four) times daily.      . magnesium hydroxide (MILK OF MAGNESIA) 400 MG/5ML suspension Take 30 mLs by mouth daily as needed for constipation.      . polyethylene glycol (MIRALAX / GLYCOLAX) packet Take 17 g by mouth daily.      . potassium chloride SA (K-DUR,KLOR-CON) 20 MEQ tablet Take 20 mEq by mouth daily.      Marland Kitchen acetaminophen (TYLENOL) 500 MG tablet Take 1,000 mg by mouth every 6 (six) hours as needed. Pain      . sodium chloride (OCEAN) 0.65 % SOLN nasal spray Place 1 spray into the nose as needed for congestion.    0   Scheduled: . antiseptic oral rinse  15 mL Mouth Rinse BID  . aspirin  81 mg Oral Daily  . ceFEPime (MAXIPIME) IV  1 g Intravenous Q8H  . diltiazem  300 mg Oral Daily  . enoxaparin (LOVENOX) injection  40 mg Subcutaneous Q24H  . feeding supplement  30 mL Oral QHS  . fluticasone  1 puff Inhalation BID  . furosemide  40 mg Intravenous Q12H  . insulin aspart  0-9 Units Subcutaneous TID WC  . insulin glargine  10 Units Subcutaneous Q24H  . levalbuterol  0.63 mg Nebulization Q6H  . polyethylene glycol  17 g Oral Daily  . sodium  chloride  3 mL Intravenous Q12H  . vancomycin  750 mg Intravenous Q12H   Continuous:  UXL:KGMWNU chloride, acetaminophen, levalbuterol, ondansetron (ZOFRAN) IV, sodium chloride  Assesment: He has healthcare associated pneumonia. He is being treated for that. He has acute on chronic diastolic congestive heart failure and that seems to have improved. He has atrial flutter/fibrillation and his heart rate is better. He has pretty severe COPD which complicates the situation. He has diabetes and has been hypoglycemic so I'm going to discontinue DiaBeta. He has acute on chronic respiratory failure multi-factorial somewhat improved Principal Problem:   HCAP (healthcare-associated pneumonia) Active Problems:   Atrial flutter   COPD (chronic obstructive pulmonary disease)   Hypertension   Type 2 diabetes mellitus, uncontrolled   Acute-on-chronic respiratory failure   Acute on chronic diastolic CHF (congestive heart failure), NYHA class 1    Plan: Continue current treatments. I will ask for a PT consult    LOS: 1 day   Emerick Weatherly L 06/08/2013, 8:52 AM

## 2013-06-08 NOTE — Progress Notes (Signed)
Inpatient Diabetes Program Recommendations  AACE/ADA: New Consensus Statement on Inpatient Glycemic Control (2013)  Target Ranges:  Prepandial:   less than 140 mg/dL      Peak postprandial:   less than 180 mg/dL (1-2 hours)      Critically ill patients:  140 - 180 mg/dL   Results for [name] [name], [name] [name] (MRN [id number]) as of 06/08/2013 07:50  Ref. Range 06/06/2013 19:19 06/07/2013 05:26 06/07/2013 18:00 06/07/2013 18:59 06/08/2013 07:36  Glucose-Capillary Latest Range: 70-99 mg/dL 409 (H) 89 61 (L) 811 (H) 61 (L)    Inpatient Diabetes Program Recommendations Oral Agents: Please consider discontinuing Glyburide while inpatient.  Note: Patient has a history of diabetes and takes Glyburide 5mg  BID, Lantus 10 units daily, and Novolog 3-15 units TID as an outpatient for diabetes management.  Currently, patient is ordered to receive Lantus 10 units Q24H, Novolog 0-9 units AC, and Glyburide 5mg  BID for inpatient glycemic control.  Patient noted to be hypoglycemic yesterday at 18:00 with blood glucose of 61 mg/dl and again this morning at 7:36 with blood glucose of 61 mg/dl.  According to the chart, the patient last received Lantus and Glyburide at the [facility].  Therefore, patient did not receive any Lantus or Glyburide from hospital staff yesterday.  Please consider discontinuing Glyburide while inpatient to prevent further episodes of hypoglycemia.  Will continue to follow.  Thanks, Orlando Penner, RN, MSN, CCRN Diabetes Coordinator Inpatient Diabetes Program [phone]

## 2013-06-08 NOTE — Evaluation (Signed)
Physical Therapy Evaluation Patient Details Name: [name] [name] MRN: [id number] DOB: 22-Mar-1936 Today's Date: 06/08/2013 Time: 0915-1006 PT Time Calculation (min): 51 min  PT Assessment / Plan / Recommendation Clinical Impression  Pt is seen for evaluation following readmission for PNA.  He had been at [facility] for rehab following a lengthy cardiovascular illness beginning around Easter.  His strength is WNL, however he is deconditioned.  His mobility is primarily limited by respiratory status.  On 3L O2, O2 sat dropped from 93% down to 87% with simply transferring from bed to chair.   It did not rebound into the 90s until his legs were elevated.  He and his family would like for him to return to home at discharge and as long as we respiratory status improves, this may very well be possible.           PT Assessment  Patient needs continued PT services    Follow Up Recommendations  Home health PT    Does the patient have the potential to tolerate intense rehabilitation      Barriers to Discharge Inaccessible home environment 5 steps into mobile home    Equipment Recommendations  None recommended by PT    Recommendations for Other Services     Frequency Min 3X/week    Precautions / Restrictions Precautions Precautions: Fall Precaution Comments: monitor O2 sats/HR Restrictions Weight Bearing Restrictions: No   Pertinent Vitals/Pain       Mobility  Bed Mobility Bed Mobility: Supine to Sit Supine to Sit: 6: Modified independent (Device/Increase time);HOB elevated;With rails Sitting - Scoot to Edge of Bed: 6: Modified independent (Device/Increase time) Sit to Supine: Not Tested (comment) Scooting to [facility]: 7: Independent Transfers Sit to Stand: 6: Modified independent (Device/Increase time);With upper extremity assist Stand to Sit: 6: Modified independent (Device/Increase time);With upper extremity assist Stand Pivot Transfers: 5: Supervision Details for Transfer Assistance: pt  had no difficulty physically doing transfer, but O2 sats drop with any exertion.  On 3 L O2, O2 sat dropped from 93% down to 87% with transfer bed to chair.  It was not until we elevated his leg rest that O2 sat got back into the low 90s. Ambulation/Gait Ambulation/Gait Assistance: Not tested (comment) (due to respiratory status)    Exercises General Exercises - Lower Extremity Ankle Circles/Pumps: Both;10 reps;Supine Quad Sets: AROM;Both;10 reps;Supine Gluteal Sets: AROM;Both;10 reps;Supine Short Arc Quad: AROM;Both;10 reps;Supine Heel Slides: AROM;Both;10 reps;Supine Hip ABduction/ADduction: AROM;Both;10 reps;Supine   PT Diagnosis: Difficulty walking;Generalized weakness  PT Problem List: Decreased strength;Decreased activity tolerance;Decreased mobility;Cardiopulmonary status limiting activity PT Treatment Interventions: Gait training;Stair training;Functional mobility training;Therapeutic exercise;Patient/family education   PT Goals Acute Rehab PT Goals PT Goal: Supine/Side to Sit - Progress: Discontinued (comment) PT Goal: Sit to Supine/Side - Progress: Discontinued (comment) Pt will Ambulate: 16 - 50 feet;with supervision;with rolling walker PT Goal: Ambulate - Progress: Goal set today Pt will Go Up / Down Stairs: 3-5 stairs;with supervision;with rail(s) PT Goal: Up/Down Stairs - Progress: Goal set today Pt will Perform Home Exercise Program: with supervision, verbal cues required/provided PT Goal: Perform Home Exercise Program - Progress: Goal set today  Visit Information  Last PT Received On: 06/08/13    Subjective Data  Subjective: feels very weak Patient Stated Goal: wants to be able to go home at d/c   Prior Functioning  Home Living Additional Comments: Pt had recently been at [facility] for rehab following lengthy hospitalization Prior Function Level of Independence: Needs assistance Needs Assistance: Bathing;Dressing;Meal Prep;Light Housekeeping;Gait Bath:  Minimal  Dressing: Minimal Meal Prep: Total Light Housekeeping: Total Gait Assistance: ambulated with walker and SBA Vocation: Retired    Copywriter, advertising Arousal/Alertness: Awake/alert Behavior During Therapy: WFL for tasks assessed/performed Overall Cognitive Status: Within Functional Limits for tasks assessed    Extremity/Trunk Assessment Right Lower Extremity Assessment RLE ROM/Strength/Tone: Within functional levels RLE Sensation: WFL - Light Touch RLE Coordination: WFL - gross motor Left Lower Extremity Assessment LLE ROM/Strength/Tone: Within functional levels LLE Sensation: WFL - Light Touch LLE Coordination: WFL - gross motor Trunk Assessment Trunk Assessment: Kyphotic   Balance    End of Session PT - End of Session Equipment Utilized During Treatment: Oxygen Activity Tolerance: Other (comment) (tx limited by respiratory status) Patient left: in chair;with call bell/phone within reach;with family/visitor present Nurse Communication: Mobility status  GP     Konrad Penta 06/08/2013, 10:37 AM

## 2013-06-09 LAB — CBC WITH DIFFERENTIAL/PLATELET
Basophils Absolute: 0 10*3/uL (ref 0.0–0.1)
Lymphocytes Relative: 13 % (ref 12–46)
Neutro Abs: 4.8 10*3/uL (ref 1.7–7.7)
Platelets: 191 10*3/uL (ref 150–400)
RDW: 18.6 % — ABNORMAL HIGH (ref 11.5–15.5)
WBC: 8.7 10*3/uL (ref 4.0–10.5)

## 2013-06-09 LAB — BASIC METABOLIC PANEL
Calcium: 8.4 mg/dL (ref 8.4–10.5)
Creatinine, Ser: 0.93 mg/dL (ref 0.50–1.35)
GFR calc Af Amer: >90 mL/min (ref >90)

## 2013-06-09 LAB — GLUCOSE, CAPILLARY
Glucose-Capillary: 161 mg/dL — ABNORMAL HIGH (ref 70–99)
Glucose-Capillary: 117 mg/dL — ABNORMAL HIGH (ref 70–99)

## 2013-06-09 MED ORDER — GUAIFENESIN ER 600 MG PO TB12
1200.0000 mg | ORAL_TABLET | Freq: Two times a day (BID) | ORAL | Status: DC
  Administered 2013-06-09 – 2013-06-22 (×27): 1200 mg via ORAL
  Filled 2013-06-09 (×27): qty 2

## 2013-06-09 NOTE — Progress Notes (Signed)
PT Cancellation Note  Patient Details Name: [name] [name] MRN: [id number] DOB: 1936/03/06   Cancelled Treatment:    Reason Eval/Treat Not Completed: Medical issues which prohibited therapy Pt declines PT due to severe DOE.  Konrad Penta 06/09/2013, 5:58 PM

## 2013-06-09 NOTE — Progress Notes (Signed)
Subjective: He says he had a little more shortness of breath last night but generally feels better than yesterday. He has no other new complaints. He is still coughing. He is not able to cough anything up.  Objective: Vital signs in last 24 hours: Temp:  [98.5 F (36.9 C)-98.7 F (37.1 C)] 98.5 F (36.9 C) (06/10 0557) Pulse Rate:  [63-109] 109 (06/10 0557) Resp:  [18-20] 18 (06/10 0557) BP: (108-129)/(55-71) 129/71 mmHg (06/10 0557) SpO2:  [88 %-96 %] 94 % (06/10 0706) Weight:  [76.2 kg (167 lb 15.9 oz)] 76.2 kg (167 lb 15.9 oz) (06/10 0557) Weight change: 1.64 kg (3 lb 9.9 oz) Last BM Date: 06/06/13  Intake/Output from previous day: 06/09 0701 - 06/10 0700 In: 1120 [P.O.:690; I.V.:130; IV Piggyback:300] Out: 900 [Urine:900]  PHYSICAL EXAM General appearance: alert, cooperative and mild distress Resp: rhonchi bilaterally Cardio: irregularly irregular rhythm GI: soft, non-tender; bowel sounds normal; no masses,  no organomegaly Extremities: extremities normal, atraumatic, no cyanosis or edema  Lab Results:    Basic Metabolic Panel:  Recent Labs  95/28/41 0429 06/09/13 0500  NA 141 139  K 4.1 3.8  CL 100 98  CO2 33* 33*  GLUCOSE 64* 78  BUN 19 19  CREATININE 1.01 0.93  CALCIUM 8.2* 8.4   Liver Function Tests:  Recent Labs  06/07/13 1240  AST 15  ALT 13  ALKPHOS 95  BILITOT 1.2  PROT 7.9  ALBUMIN 2.4*   No results found for this basename: LIPASE, AMYLASE,  in the last 72 hours No results found for this basename: AMMONIA,  in the last 72 hours CBC:  Recent Labs  06/07/13 1240 06/08/13 0429 06/09/13 0500  WBC 9.5 7.9 8.7  NEUTROABS 5.8  --  4.8  HGB 8.4* 8.1* 8.9*  HCT 27.1* 26.6* 29.4*  MCV 93.8 94.3 94.2  PLT 170 169 191   Cardiac Enzymes:  Recent Labs  06/07/13 1240  TROPONINI <0.30   BNP:  Recent Labs  06/07/13 1240  PROBNP 2121.0*   D-Dimer: No results found for this basename: DDIMER,  in the last 72 hours CBG:  Recent  Labs  06/08/13 0736 06/08/13 0818 06/08/13 1232 06/08/13 1654 06/08/13 2126 06/09/13 0808  GLUCAP 61* 93 181* 138* 121* 136*   Hemoglobin A1C: No results found for this basename: HGBA1C,  in the last 72 hours Fasting Lipid Panel: No results found for this basename: CHOL, HDL, LDLCALC, TRIG, CHOLHDL, LDLDIRECT,  in the last 72 hours Thyroid Function Tests: No results found for this basename: TSH, T4TOTAL, FREET4, T3FREE, THYROIDAB,  in the last 72 hours Anemia Panel: No results found for this basename: VITAMINB12, FOLATE, FERRITIN, TIBC, IRON, RETICCTPCT,  in the last 72 hours Coagulation: No results found for this basename: LABPROT, INR,  in the last 72 hours Urine Drug Screen: Drugs of Abuse  No results found for this basename: labopia, cocainscrnur, labbenz, amphetmu, thcu, labbarb    Alcohol Level: No results found for this basename: ETH,  in the last 72 hours Urinalysis: No results found for this basename: COLORURINE, APPERANCEUR, LABSPEC, PHURINE, GLUCOSEU, HGBUR, BILIRUBINUR, KETONESUR, PROTEINUR, UROBILINOGEN, NITRITE, LEUKOCYTESUR,  in the last 72 hours Misc. Labs:  ABGS  Recent Labs  06/07/13 1505  PHART 7.463*  PO2ART 61.2*  TCO2 30.9  HCO3 32.7*   CULTURES Recent Results (from the past 240 hour(s))  CULTURE, BLOOD (ROUTINE X 2)     Status: None   Collection Time    06/07/13  2:55 PM  Result Value Range Status   Specimen Description BLOOD LEFT ARM   Final   Special Requests BOTTLES DRAWN AEROBIC ONLY 6CC   Final   Culture NO GROWTH 1 DAY   Final   Report Status PENDING   Incomplete  CULTURE, BLOOD (ROUTINE X 2)     Status: None   Collection Time    06/07/13  3:20 PM      Result Value Range Status   Specimen Description BLOOD LEFT ARM   Final   Special Requests BOTTLES DRAWN AEROBIC AND ANAEROBIC 6CC   Final   Culture NO GROWTH 1 DAY   Final   Report Status PENDING   Incomplete  MRSA PCR SCREENING     Status: None   Collection Time    06/07/13   8:01 PM      Result Value Range Status   MRSA by PCR NEGATIVE  NEGATIVE Final   Comment:            The GeneXpert MRSA Assay (FDA     approved for NASAL specimens     only), is one component of a     comprehensive MRSA colonization     surveillance program. It is not     intended to diagnose MRSA     infection nor to guide or     monitor treatment for     MRSA infections.   Studies/Results: Dg Chest Port 1 View  06/07/2013   *RADIOLOGY REPORT*  Clinical Data: Tachycardia and shortness of breath.  PORTABLE CHEST - 1 VIEW  Comparison: Chest x-ray 05/25/2013.  Findings: Emphysematous changes again noted.  Markedly worsened opacity at the base of the left hemithorax compatible with increasing atelectasis and/or consolidation and moderate to large left-sided pleural effusion.  Patchy multifocal interstitial and airspace opacities are also noted throughout the mid to lower lungs bilaterally, and have also increased slightly compared to the prior examination.  No evidence of pulmonary edema.  Heart size is upper limits of normal. The patient is rotated to the left on today's exam, resulting in distortion of the mediastinal contours and reduced diagnostic sensitivity and specificity for mediastinal pathology.  Atherosclerosis in the thoracic aorta.  Status post median sternotomy for aortic valve replacement (a stented bioprosthesis is noted).  IMPRESSION: 1.  Worsening multifocal interstitial and airspace disease throughout the mid and lower lungs bilaterally, particularly at the left base where there is also an enlarging moderate to large left pleural effusion.  Findings are concerning for old worsening multilobar pneumonia.   Original Report Authenticated By: Trudie Reed, M.D.    Medications:  Prior to Admission:  Prescriptions prior to admission  Medication Sig Dispense Refill  . aspirin 81 MG chewable tablet Chew 81 mg by mouth daily.      Marland Kitchen diltiazem (CARDIZEM CD) 300 MG 24 hr capsule Take  300 mg by mouth daily.      . feeding supplement (PRO-STAT SUGAR FREE 64) LIQD Take 30 mLs by mouth at bedtime.      . fluticasone (FLOVENT HFA) 110 MCG/ACT inhaler Inhale 1 puff into the lungs 2 (two) times daily.      . furosemide (LASIX) 80 MG tablet Take 80 mg by mouth daily.      Marland Kitchen glyBURIDE (DIABETA) 5 MG tablet Take 1 tablet (5 mg total) by mouth 2 (two) times daily with a meal.      . insulin aspart (NOVOLOG) 100 UNIT/ML injection Inject 3-15 Units into the skin 3 (three) times  daily with meals. Sliding scale as follows: 121-150=3 units 151-200=4 units 201-250=7 units 251-300=9 units 301-350=12 units 351-400=15 units      . insulin glargine (LANTUS) 100 UNIT/ML injection Inject 0.1 mLs (10 Units total) into the skin daily.  10 mL  12  . ipratropium-albuterol (DUONEB) 0.5-2.5 (3) MG/3ML SOLN Take 3 mLs by nebulization 4 (four) times daily.      . magnesium hydroxide (MILK OF MAGNESIA) 400 MG/5ML suspension Take 30 mLs by mouth daily as needed for constipation.      . polyethylene glycol (MIRALAX / GLYCOLAX) packet Take 17 g by mouth daily.      . potassium chloride SA (K-DUR,KLOR-CON) 20 MEQ tablet Take 20 mEq by mouth daily.      Marland Kitchen acetaminophen (TYLENOL) 500 MG tablet Take 1,000 mg by mouth every 6 (six) hours as needed. Pain      . sodium chloride (OCEAN) 0.65 % SOLN nasal spray Place 1 spray into the nose as needed for congestion.    0   Scheduled: . antiseptic oral rinse  15 mL Mouth Rinse BID  . aspirin  81 mg Oral Daily  . ceFEPime (MAXIPIME) IV  1 g Intravenous Q8H  . diltiazem  300 mg Oral Daily  . enoxaparin (LOVENOX) injection  40 mg Subcutaneous Q24H  . feeding supplement  30 mL Oral QHS  . fluticasone  1 puff Inhalation BID  . furosemide  40 mg Intravenous Q12H  . insulin aspart  0-9 Units Subcutaneous TID WC  . insulin glargine  10 Units Subcutaneous Q24H  . levalbuterol  0.63 mg Nebulization QID  . polyethylene glycol  17 g Oral Daily  . sodium chloride  3 mL  Intravenous Q12H  . vancomycin  750 mg Intravenous Q12H   Continuous:  OZH:YQMVHQ chloride, acetaminophen, levalbuterol, ondansetron (ZOFRAN) IV, sodium chloride  Assesment: He is healthcare associated pneumonia. He has congestive heart failure, atrial fibrillation and diabetes. He has significant COPD. He has improved. IV access is poor so I'm going to have her get a PICC line. I want to try getting some help with coughing up some sputum. Principal Problem:   HCAP (healthcare-associated pneumonia) Active Problems:   Atrial flutter   COPD (chronic obstructive pulmonary disease)   Hypertension   Type 2 diabetes mellitus, uncontrolled   Acute-on-chronic respiratory failure   Acute on chronic diastolic CHF (congestive heart failure), NYHA class 1    Plan: He'll have PICC line he will have Mucinex and flutter valve    LOS: 2 days   Yves Fodor L 06/09/2013, 8:44 AM

## 2013-06-09 NOTE — Clinical Social Work Note (Signed)
Spoke with pt's wife at bedside who also reports they would like pt to return home if possible. Agreeable to keeping [facility] updated. CSW to continue to follow.  Derenda Fennel, [place] [phone]

## 2013-06-09 NOTE — Progress Notes (Signed)
Hypoglycemic Event  CBG:64  Treatment: 4 oz orange juice  Symptoms: none  Follow-up CBG: Time:1700 CBG Result:96  Possible Reasons for Event:unknown  Comments/MD notified:Dr.Hawkins    [name] [name]  Remember to initiate Hypoglycemia Order Set & complete

## 2013-06-10 ENCOUNTER — Encounter (HOSPITAL_COMMUNITY): Payer: Medicare Other

## 2013-06-10 ENCOUNTER — Inpatient Hospital Stay (HOSPITAL_COMMUNITY): Payer: Medicare Other

## 2013-06-10 LAB — CBC WITH DIFFERENTIAL/PLATELET
Basophils Relative: 0 % (ref 0–1)
HCT: 27.9 % — ABNORMAL LOW (ref 39.0–52.0)
Hemoglobin: 8.7 g/dL — ABNORMAL LOW (ref 13.0–17.0)
Lymphocytes Relative: 11 % — ABNORMAL LOW (ref 12–46)
Lymphs Abs: 0.8 10*3/uL (ref 0.7–4.0)
MCHC: 31.2 g/dL (ref 30.0–36.0)
Monocytes Absolute: 2.2 10*3/uL — ABNORMAL HIGH (ref 0.1–1.0)
Monocytes Relative: 30 % — ABNORMAL HIGH (ref 3–12)
Neutro Abs: 4.2 10*3/uL (ref 1.7–7.7)
RBC: 3 MIL/uL — ABNORMAL LOW (ref 4.22–5.81)

## 2013-06-10 LAB — BASIC METABOLIC PANEL
BUN: 19 mg/dL (ref 6–23)
Chloride: 97 mEq/L (ref 96–112)
GFR calc Af Amer: >90 mL/min (ref >90)
Potassium: 3.7 mEq/L (ref 3.5–5.1)

## 2013-06-10 LAB — GLUCOSE, CAPILLARY
Glucose-Capillary: 92 mg/dL (ref 70–99)
Glucose-Capillary: 233 mg/dL — ABNORMAL HIGH (ref 70–99)

## 2013-06-10 MED ORDER — METHYLPREDNISOLONE SODIUM SUCC 125 MG IJ SOLR
125.0000 mg | Freq: Four times a day (QID) | INTRAMUSCULAR | Status: DC
  Administered 2013-06-10 – 2013-06-12 (×8): 125 mg via INTRAVENOUS
  Filled 2013-06-10 (×8): qty 2

## 2013-06-10 MED ORDER — DILTIAZEM HCL ER COATED BEADS 180 MG PO CP24
360.0000 mg | ORAL_CAPSULE | Freq: Every day | ORAL | Status: DC
  Administered 2013-06-10 – 2013-06-22 (×13): 360 mg via ORAL
  Filled 2013-06-10 (×13): qty 2

## 2013-06-10 MED ORDER — SODIUM CHLORIDE 0.9 % IJ SOLN
10.0000 mL | Freq: Two times a day (BID) | INTRAMUSCULAR | Status: DC
  Administered 2013-06-10 – 2013-06-15 (×9): 10 mL
  Administered 2013-06-17: 13 mL
  Administered 2013-06-17 – 2013-06-18 (×3): 10 mL

## 2013-06-10 MED ORDER — SODIUM CHLORIDE 0.9 % IJ SOLN
10.0000 mL | INTRAMUSCULAR | Status: DC | PRN
  Administered 2013-06-16: 10 mL

## 2013-06-10 MED ORDER — VANCOMYCIN HCL 10 G IV SOLR
1250.0000 mg | INTRAVENOUS | Status: DC
  Administered 2013-06-10 – 2013-06-13 (×4): 1250 mg via INTRAVENOUS
  Filled 2013-06-10 (×6): qty 1250

## 2013-06-10 NOTE — Clinical Social Work Note (Signed)
CSW met with [name] [name] and [name] [name]'s wife at bedside following [name] [name] treatment today. [name] [name] is improving and reports he is feeling better. [name] [name] and wife continue to want [name] [name] to return home at d/c. CSW updated [facility] and will sign off. CM aware of home health and possible oxygen needs. [name] [name] was wearing oxygen at night only prior to admission.  Derenda Fennel, [place] [phone]

## 2013-06-10 NOTE — Progress Notes (Signed)
ANTIBIOTIC CONSULT NOTE   Pharmacy Consult for Vancomycin and Cefepime Indication: pneumonia  No Known Allergies  Patient Measurements: Height: 5\' 6"  (167.6 cm) Weight: 165 lb 8 oz (75.07 kg) IBW/kg (Calculated) : 63.8  Vital Signs: Temp: 97.3 F (36.3 C) (06/11 0544) BP: 129/73 mmHg (06/11 0544) Pulse Rate: 105 (06/11 0544) Intake/Output from previous day: 06/10 0701 - 06/11 0700 In: 660 [P.O.:660] Out: 1625 [Urine:1625] Intake/Output from this shift: Total I/O In: -  Out: 600 [Urine:600]  Labs:  Recent Labs  06/08/13 0429 06/09/13 0500 06/10/13 0442  WBC 7.9 8.7 7.3  HGB 8.1* 8.9* 8.7*  PLT 169 191 175  CREATININE 1.01 0.93 0.96   Estimated Creatinine Clearance: 60 ml/min (by C-G formula based on Cr of 0.96).  Recent Labs  06/10/13 0442  VANCOTROUGH 22.4*    Microbiology: Recent Results (from the past 720 hour(s))  PNEUMOCYSTIS JIROVECI SMEAR BY DFA     Status: None   Collection Time    05/12/13 12:43 PM      Result Value Range Status   Specimen Source-PJSRC BRONCHIAL WASHINGS   Final   Pneumocystis jiroveci Ag NEGATIVE   Final   Comment: Performed at [facility]  LEGIONELLA CULTURE     Status: None   Collection Time    05/12/13 12:43 PM      Result Value Range Status   Specimen Description BRONCHIAL WASHINGS   Final   Special Requests NONE   Final   Culture NO LEGIONELLA ISOLATED   Final   Report Status 05/18/2013 FINAL   Final  CULTURE, RESPIRATORY (NON-EXPECTORATED)     Status: None   Collection Time    05/12/13 12:43 PM      Result Value Range Status   Specimen Description BRONCHIAL WASHINGS   Final   Special Requests NONE   Final   Gram Stain     Final   Value: RARE WBC PRESENT,BOTH PMN AND MONONUCLEAR     NO SQUAMOUS EPITHELIAL CELLS SEEN     NO ORGANISMS SEEN   Culture NO GROWTH 2 DAYS   Final   Report Status 05/14/2013 FINAL   Final  AFB CULTURE WITH SMEAR     Status: None   Collection Time    05/12/13 12:43 PM    Result Value Range Status   Specimen Description BRONCHIAL WASHINGS   Final   Special Requests NONE   Final   ACID FAST SMEAR NO ACID FAST BACILLI SEEN   Final   Culture     Final   Value: CULTURE WILL BE EXAMINED FOR 6 WEEKS BEFORE ISSUING A FINAL REPORT   Report Status PENDING   Incomplete  FUNGUS CULTURE W SMEAR     Status: None   Collection Time    05/12/13 12:43 PM      Result Value Range Status   Specimen Description BRONCHIAL WASHINGS   Final   Special Requests NONE   Final   Fungal Smear NO YEAST OR FUNGAL ELEMENTS SEEN   Final   Culture CANDIDA ALBICANS   Final   Report Status 06/08/2013 FINAL   Final  CULTURE, BLOOD (ROUTINE X 2)     Status: None   Collection Time    05/13/13  2:15 AM      Result Value Range Status   Specimen Description BLOOD LEFT ARM   Final   Special Requests BOTTLES DRAWN AEROBIC AND ANAEROBIC 10CC EACH   Final   Culture  Setup Time  05/13/2013 08:57   Final   Culture NO GROWTH 5 DAYS   Final   Report Status 05/19/2013 FINAL   Final  CULTURE, BLOOD (ROUTINE X 2)     Status: None   Collection Time    05/13/13  2:30 AM      Result Value Range Status   Specimen Description BLOOD LEFT HAND   Final   Special Requests BOTTLES DRAWN AEROBIC AND ANAEROBIC 10CC EACH   Final   Culture  Setup Time 05/13/2013 08:56   Final   Culture NO GROWTH 5 DAYS   Final   Report Status 05/19/2013 FINAL   Final  URINE CULTURE     Status: None   Collection Time    05/13/13  5:14 AM      Result Value Range Status   Specimen Description URINE, CATHETERIZED   Final   Special Requests NONE   Final   Culture  Setup Time 05/13/2013 05:36   Final   Colony Count NO GROWTH   Final   Culture NO GROWTH   Final   Report Status 05/14/2013 FINAL   Final  CULTURE, RESPIRATORY (NON-EXPECTORATED)     Status: None   Collection Time    05/13/13  8:05 AM      Result Value Range Status   Specimen Description TRACHEAL ASPIRATE   Final   Special Requests NONE   Final   Gram Stain      Final   Value: MODERATE WBC PRESENT,BOTH PMN AND MONONUCLEAR     NO SQUAMOUS EPITHELIAL CELLS SEEN     NO ORGANISMS SEEN   Culture NO GROWTH 2 DAYS   Final   Report Status 05/15/2013 FINAL   Final  BODY FLUID CULTURE     Status: None   Collection Time    05/22/13 11:38 AM      Result Value Range Status   Specimen Description PLEURAL LEFT FLUID   Final   Special Requests 7.0ML FLUID   Final   Gram Stain     Final   Value: NO WBC SEEN     NO ORGANISMS SEEN   Culture NO GROWTH 3 DAYS   Final   Report Status 05/25/2013 FINAL   Final  CULTURE, BLOOD (ROUTINE X 2)     Status: None   Collection Time    06/07/13  2:55 PM      Result Value Range Status   Specimen Description BLOOD LEFT ARM   Final   Special Requests BOTTLES DRAWN AEROBIC ONLY 6CC   Final   Culture NO GROWTH 2 DAYS   Final   Report Status PENDING   Incomplete  CULTURE, BLOOD (ROUTINE X 2)     Status: None   Collection Time    06/07/13  3:20 PM      Result Value Range Status   Specimen Description BLOOD LEFT ARM   Final   Special Requests BOTTLES DRAWN AEROBIC AND ANAEROBIC 6CC   Final   Culture NO GROWTH 2 DAYS   Final   Report Status PENDING   Incomplete  MRSA PCR SCREENING     Status: None   Collection Time    06/07/13  8:01 PM      Result Value Range Status   MRSA by PCR NEGATIVE  NEGATIVE Final   Comment:            The GeneXpert MRSA Assay (FDA     approved for NASAL specimens     only), is  one component of a     comprehensive MRSA colonization     surveillance program. It is not     intended to diagnose MRSA     infection nor to guide or     monitor treatment for     MRSA infections.   Medical History: Past Medical History  Diagnosis Date  . COPD (chronic obstructive pulmonary disease)   . AF (atrial fibrillation)   . Hypertension   . Gout   . Arthritis   . Small bowel obstruction   . Renal insufficiency   . Aortic stenosis   . Hydropneumothorax   . Diabetes mellitus   . CHF (congestive heart  failure)   . On home O2     qhs prn  . Pneumonia 05/04/2013    history of pneumonia  . Pneumonia 10/12  . Hydropneumothorax 2012   Medications:  Scheduled:  . antiseptic oral rinse  15 mL Mouth Rinse BID  . aspirin  81 mg Oral Daily  . ceFEPime (MAXIPIME) IV  1 g Intravenous Q8H  . diltiazem  360 mg Oral Daily  . enoxaparin (LOVENOX) injection  40 mg Subcutaneous Q24H  . feeding supplement  30 mL Oral QHS  . fluticasone  1 puff Inhalation BID  . furosemide  40 mg Intravenous Q12H  . guaiFENesin  1,200 mg Oral BID  . insulin aspart  0-9 Units Subcutaneous TID WC  . insulin glargine  10 Units Subcutaneous Q24H  . levalbuterol  0.63 mg Nebulization QID  . polyethylene glycol  17 g Oral Daily  . sodium chloride  3 mL Intravenous Q12H  . vancomycin  750 mg Intravenous Q12H   Assessment: Trough level is above target.  Pt has good renal fxn for age.  Estimated Creatinine Clearance: 60 ml/min (by C-G formula based on Cr of 0.96).  Vancomycin clearance is worse than predicted.  Goal of Therapy:  Vancomycin trough level 15-20 mcg/ml  Plan:  Cefepime 1gm IV every 8 hours. Change Vancomycin to 1250mg  IV q24hrs. Measure antibiotic drug levels at steady state Follow up culture results  Valrie Hart A 06/10/2013,8:08 AM

## 2013-06-10 NOTE — Progress Notes (Signed)
Physical Therapy Treatment Patient Details Name: [name] [name] MRN: [id number] DOB: 07-19-1936 Today's Date: 06/10/2013 Time: 4098-1191 PT Time Calculation (min): 43 min  PT Assessment / Plan / Recommendation Comments on Treatment Session  Pt reports feeling a bit better this afternoon, although he is still dyspneic at rest.  O2 sats were monitored throughout tx and we were able to maintain sats around 90% throughout tx.  He was able to tolerate ther ex in the bed and was then able to ambulate 12' with a walker to a chair.  He is very cooperative and was instructed in strategies for slowly improving overall mobility.    Follow Up Recommendations        Does the patient have the potential to tolerate intense rehabilitation     Barriers to Discharge        Equipment Recommendations       Recommendations for Other Services    Frequency     Plan Discharge plan remains appropriate;Frequency remains appropriate    Precautions / Restrictions     Pertinent Vitals/Pain     Mobility  Bed Mobility Supine to Sit: 6: Modified independent (Device/Increase time);HOB elevated Sitting - Scoot to Edge of Bed: 6: Modified independent (Device/Increase time) Transfers Sit to Stand: 6: Modified independent (Device/Increase time);With upper extremity assist Stand to Sit: 6: Modified independent (Device/Increase time);To chair/3-in-1;With upper extremity assist Ambulation/Gait Ambulation/Gait Assistance: 5: Supervision Ambulation Distance (Feet): 12 Feet Assistive device: Rolling walker Ambulation/Gait Assistance Details: no instability with gait Gait Pattern: Trunk flexed Gait velocity: WNL General Gait Details: gait endurance limited by respiratory status Stairs: No Wheelchair Mobility Wheelchair Mobility: No    Exercises General Exercises - Lower Extremity Ankle Circles/Pumps: AROM;Both;10 reps;Supine Quad Sets: AROM;Both;10 reps;Supine Gluteal Sets: AROM;Both;10 reps;Supine Short  Arc Quad: AROM;Both;10 reps;Supine Heel Slides: AROM;Both;10 reps;Supine Hip ABduction/ADduction: AROM;Both;10 reps;Supine   PT Diagnosis:    PT Problem List:   PT Treatment Interventions:     PT Goals Acute Rehab PT Goals PT Goal: Ambulate - Progress: Progressing toward goal PT Goal: Perform Home Exercise Program - Progress: Progressing toward goal  Visit Information  Last PT Received On: 06/10/13    Subjective Data  Subjective: I think I'm feeling a little better   Cognition       Balance     End of Session PT - End of Session Equipment Utilized During Treatment: Gait belt;Oxygen Activity Tolerance: Patient tolerated treatment well Patient left: in chair;with call bell/phone within reach;with family/visitor present   GP     Konrad Penta 06/10/2013, 3:43 PM

## 2013-06-10 NOTE — Progress Notes (Signed)
Subjective: He's short of breath this morning but said he had a pretty good night last night. He has not had his breathing treatment yet. He was unable to work with physical therapy yesterday because of shortness of breath but then yesterday evening with better.  Objective: Vital signs in last 24 hours: Temp:  [97.3 F (36.3 C)-98.3 F (36.8 C)] 97.3 F (36.3 C) (06/11 0544) Pulse Rate:  [104-109] 105 (06/11 0544) Resp:  [18-20] 20 (06/11 0544) BP: (118-129)/(70-78) 129/73 mmHg (06/11 0544) SpO2:  [91 %-99 %] 94 % (06/11 0544) Weight:  [75.07 kg (165 lb 8 oz)] 75.07 kg (165 lb 8 oz) (06/11 0544) Weight change: -1.13 kg (-2 lb 7.9 oz) Last BM Date: 06/06/13  Intake/Output from previous day: 06/10 0701 - 06/11 0700 In: 660 [P.O.:660] Out: 1625 [Urine:1625]  PHYSICAL EXAM General appearance: alert, cooperative and moderate distress Resp: rhonchi bilaterally Cardio: irregularly irregular rhythm GI: soft, non-tender; bowel sounds normal; no masses,  no organomegaly Extremities: extremities normal, atraumatic, no cyanosis or edema  Lab Results:    Basic Metabolic Panel:  Recent Labs  46/96/29 0500 06/10/13 0442  NA 139 139  K 3.8 3.7  CL 98 97  CO2 33* 36*  GLUCOSE 78 72  BUN 19 19  CREATININE 0.93 0.96  CALCIUM 8.4 8.2*   Liver Function Tests:  Recent Labs  06/07/13 1240  AST 15  ALT 13  ALKPHOS 95  BILITOT 1.2  PROT 7.9  ALBUMIN 2.4*   No results found for this basename: LIPASE, AMYLASE,  in the last 72 hours No results found for this basename: AMMONIA,  in the last 72 hours CBC:  Recent Labs  06/09/13 0500 06/10/13 0442  WBC 8.7 7.3  NEUTROABS 4.8 4.2  HGB 8.9* 8.7*  HCT 29.4* 27.9*  MCV 94.2 93.0  PLT 191 175   Cardiac Enzymes:  Recent Labs  06/07/13 1240  TROPONINI <0.30   BNP:  Recent Labs  06/07/13 1240  PROBNP 2121.0*   D-Dimer: No results found for this basename: DDIMER,  in the last 72 hours CBG:  Recent Labs   06/08/13 2126 06/09/13 0808 06/09/13 1120 06/09/13 1619 06/09/13 1701 06/09/13 2052  GLUCAP 121* 136* 161* 64* 96 117*   Hemoglobin A1C: No results found for this basename: HGBA1C,  in the last 72 hours Fasting Lipid Panel: No results found for this basename: CHOL, HDL, LDLCALC, TRIG, CHOLHDL, LDLDIRECT,  in the last 72 hours Thyroid Function Tests: No results found for this basename: TSH, T4TOTAL, FREET4, T3FREE, THYROIDAB,  in the last 72 hours Anemia Panel: No results found for this basename: VITAMINB12, FOLATE, FERRITIN, TIBC, IRON, RETICCTPCT,  in the last 72 hours Coagulation: No results found for this basename: LABPROT, INR,  in the last 72 hours Urine Drug Screen: Drugs of Abuse  No results found for this basename: labopia, cocainscrnur, labbenz, amphetmu, thcu, labbarb    Alcohol Level: No results found for this basename: ETH,  in the last 72 hours Urinalysis: No results found for this basename: COLORURINE, APPERANCEUR, LABSPEC, PHURINE, GLUCOSEU, HGBUR, BILIRUBINUR, KETONESUR, PROTEINUR, UROBILINOGEN, NITRITE, LEUKOCYTESUR,  in the last 72 hours Misc. Labs:  ABGS  Recent Labs  06/07/13 1505  PHART 7.463*  PO2ART 61.2*  TCO2 30.9  HCO3 32.7*   CULTURES Recent Results (from the past 240 hour(s))  CULTURE, BLOOD (ROUTINE X 2)     Status: None   Collection Time    06/07/13  2:55 PM      Result Value  Range Status   Specimen Description BLOOD LEFT ARM   Final   Special Requests BOTTLES DRAWN AEROBIC ONLY 6CC   Final   Culture NO GROWTH 2 DAYS   Final   Report Status PENDING   Incomplete  CULTURE, BLOOD (ROUTINE X 2)     Status: None   Collection Time    06/07/13  3:20 PM      Result Value Range Status   Specimen Description BLOOD LEFT ARM   Final   Special Requests BOTTLES DRAWN AEROBIC AND ANAEROBIC 6CC   Final   Culture NO GROWTH 2 DAYS   Final   Report Status PENDING   Incomplete  MRSA PCR SCREENING     Status: None   Collection Time    06/07/13  8:01  PM      Result Value Range Status   MRSA by PCR NEGATIVE  NEGATIVE Final   Comment:            The GeneXpert MRSA Assay (FDA     approved for NASAL specimens     only), is one component of a     comprehensive MRSA colonization     surveillance program. It is not     intended to diagnose MRSA     infection nor to guide or     monitor treatment for     MRSA infections.   Studies/Results: No results found.  Medications:  Prior to Admission:  Prescriptions prior to admission  Medication Sig Dispense Refill  . aspirin 81 MG chewable tablet Chew 81 mg by mouth daily.      Marland Kitchen diltiazem (CARDIZEM CD) 300 MG 24 hr capsule Take 300 mg by mouth daily.      . feeding supplement (PRO-STAT SUGAR FREE 64) LIQD Take 30 mLs by mouth at bedtime.      . fluticasone (FLOVENT HFA) 110 MCG/ACT inhaler Inhale 1 puff into the lungs 2 (two) times daily.      . furosemide (LASIX) 80 MG tablet Take 80 mg by mouth daily.      Marland Kitchen glyBURIDE (DIABETA) 5 MG tablet Take 1 tablet (5 mg total) by mouth 2 (two) times daily with a meal.      . insulin aspart (NOVOLOG) 100 UNIT/ML injection Inject 3-15 Units into the skin 3 (three) times daily with meals. Sliding scale as follows: 121-150=3 units 151-200=4 units 201-250=7 units 251-300=9 units 301-350=12 units 351-400=15 units      . insulin glargine (LANTUS) 100 UNIT/ML injection Inject 0.1 mLs (10 Units total) into the skin daily.  10 mL  12  . ipratropium-albuterol (DUONEB) 0.5-2.5 (3) MG/3ML SOLN Take 3 mLs by nebulization 4 (four) times daily.      . magnesium hydroxide (MILK OF MAGNESIA) 400 MG/5ML suspension Take 30 mLs by mouth daily as needed for constipation.      . polyethylene glycol (MIRALAX / GLYCOLAX) packet Take 17 g by mouth daily.      . potassium chloride SA (K-DUR,KLOR-CON) 20 MEQ tablet Take 20 mEq by mouth daily.      Marland Kitchen acetaminophen (TYLENOL) 500 MG tablet Take 1,000 mg by mouth every 6 (six) hours as needed. Pain      . sodium chloride  (OCEAN) 0.65 % SOLN nasal spray Place 1 spray into the nose as needed for congestion.    0   Scheduled: . antiseptic oral rinse  15 mL Mouth Rinse BID  . aspirin  81 mg Oral Daily  . ceFEPime (MAXIPIME)  IV  1 g Intravenous Q8H  . diltiazem  360 mg Oral Daily  . enoxaparin (LOVENOX) injection  40 mg Subcutaneous Q24H  . feeding supplement  30 mL Oral QHS  . fluticasone  1 puff Inhalation BID  . furosemide  40 mg Intravenous Q12H  . guaiFENesin  1,200 mg Oral BID  . insulin aspart  0-9 Units Subcutaneous TID WC  . insulin glargine  10 Units Subcutaneous Q24H  . levalbuterol  0.63 mg Nebulization QID  . polyethylene glycol  17 g Oral Daily  . sodium chloride  3 mL Intravenous Q12H  . vancomycin  750 mg Intravenous Q12H   Continuous:  JXB:JYNWGN chloride, acetaminophen, levalbuterol, ondansetron (ZOFRAN) IV, sodium chloride  Assesment: He is admitted with healthcare associated pneumonia complicated by acute on chronic diastolic congestive heart failure. He has acute on chronic respiratory failure from these problems. He has atrial flutter fibrillation and his heart rate is still somewhat elevated. He is deconditioned but mostly limited by his respiratory status. He has hypertension and at the pre-well controlled. He has diabetes mellitus and that is being treated in a doing fairly well Principal Problem:   HCAP (healthcare-associated pneumonia) Active Problems:   Atrial flutter   COPD (chronic obstructive pulmonary disease)   Hypertension   Type 2 diabetes mellitus, uncontrolled   Acute-on-chronic respiratory failure   Acute on chronic diastolic CHF (congestive heart failure), NYHA class 1    Plan: Continue efforts. He will continue antibiotics. I will increase his diltiazem slightly. He has not been on steroids initially but since he is having more trouble I'm going to go ahead and add IV steroids    LOS: 3 days   Deandra Gadson L 06/10/2013, 8:07 AM

## 2013-06-11 LAB — GLUCOSE, CAPILLARY
Glucose-Capillary: 220 mg/dL — ABNORMAL HIGH (ref 70–99)
Glucose-Capillary: 321 mg/dL — ABNORMAL HIGH (ref 70–99)
Glucose-Capillary: 166 mg/dL — ABNORMAL HIGH (ref 70–99)

## 2013-06-11 MED ORDER — INSULIN ASPART 100 UNIT/ML ~~LOC~~ SOLN
0.0000 [IU] | Freq: Three times a day (TID) | SUBCUTANEOUS | Status: DC
Start: 2013-06-11 — End: 2013-06-17
  Administered 2013-06-11: 3 [IU] via SUBCUTANEOUS
  Administered 2013-06-11: 11 [IU] via SUBCUTANEOUS
  Administered 2013-06-12: 15 [IU] via SUBCUTANEOUS
  Administered 2013-06-12: 3 [IU] via SUBCUTANEOUS
  Administered 2013-06-12: 8 [IU] via SUBCUTANEOUS
  Administered 2013-06-13: 11 [IU] via SUBCUTANEOUS
  Administered 2013-06-13: 5 [IU] via SUBCUTANEOUS
  Administered 2013-06-14: 8 [IU] via SUBCUTANEOUS
  Administered 2013-06-14 (×2): 15 [IU] via SUBCUTANEOUS
  Administered 2013-06-15: 11 [IU] via SUBCUTANEOUS
  Administered 2013-06-15: 5 [IU] via SUBCUTANEOUS
  Administered 2013-06-15: 8 [IU] via SUBCUTANEOUS
  Administered 2013-06-16: 15 [IU] via SUBCUTANEOUS
  Administered 2013-06-16: 3 [IU] via SUBCUTANEOUS
  Administered 2013-06-16: 11 [IU] via SUBCUTANEOUS
  Administered 2013-06-17: 8 [IU] via SUBCUTANEOUS

## 2013-06-11 MED ORDER — INSULIN GLARGINE 100 UNIT/ML ~~LOC~~ SOLN
15.0000 [IU] | SUBCUTANEOUS | Status: DC
  Administered 2013-06-11 – 2013-06-16 (×6): 15 [IU] via SUBCUTANEOUS
  Filled 2013-06-11 (×7): qty 0.15

## 2013-06-11 MED ORDER — INSULIN ASPART 100 UNIT/ML ~~LOC~~ SOLN
0.0000 [IU] | Freq: Every day | SUBCUTANEOUS | Status: DC
  Administered 2013-06-11: 3 [IU] via SUBCUTANEOUS
  Administered 2013-06-12: 4 [IU] via SUBCUTANEOUS
  Administered 2013-06-13: 3 [IU] via SUBCUTANEOUS
  Administered 2013-06-14: 22:00:00 via SUBCUTANEOUS
  Administered 2013-06-15 – 2013-06-16 (×2): 4 [IU] via SUBCUTANEOUS

## 2013-06-11 NOTE — Progress Notes (Signed)
Physical Therapy Treatment Patient Details Name: [name] [name] MRN: [id number] DOB: 12/09/1936 Today's Date: 06/11/2013 Time: 1457-1530 PT Time Calculation (min): 33 min  PT Assessment / Plan / Recommendation Comments on Treatment Session  Pt is feeling much better today, very bright affect.  He remains on 4 L O2 with no observable dyspnea at rest.  He was able to ambulate 40' with walker, only difficulty was dyspnea and drop in O2 sat to 84%.  It rebounded to 91% within 2 minutes.  I definately think he will be able to transition to home at the time of discharge unless something unforseen happens    Follow Up Recommendations        Does the patient have the potential to tolerate intense rehabilitation     Barriers to Discharge        Equipment Recommendations       Recommendations for Other Services    Frequency     Plan Discharge plan remains appropriate;Frequency remains appropriate    Precautions / Restrictions     Pertinent Vitals/Pain     Mobility  Transfers Sit to Stand: 6: Modified independent (Device/Increase time);From chair/3-in-1 Stand to Sit: 6: Modified independent (Device/Increase time);To chair/3-in-1 Ambulation/Gait Ambulation/Gait Assistance: 5: Supervision Ambulation Distance (Feet): 40 Feet Assistive device: Rolling walker Ambulation/Gait Assistance Details: gait is stable with a walker...O2 sats decreased from 93% at rest to 84% after gait (on 4 L O2) Gait Pattern: Trunk flexed Stairs: No Wheelchair Mobility Wheelchair Mobility: No    Exercises General Exercises - Lower Extremity Long Arc Quad: AROM;Both;20 reps;Seated;Limitations Hip Flexion/Marching: AROM;20 reps;Seated   PT Diagnosis:    PT Problem List:   PT Treatment Interventions:     PT Goals    Visit Information  Last PT Received On: 06/11/13    Subjective Data  Subjective: feels MUCH better   Cognition       Balance     End of Session PT - End of Session Equipment  Utilized During Treatment: Oxygen Activity Tolerance: Patient tolerated treatment well Patient left: in chair   GP     Konrad Penta 06/11/2013, 3:41 PM

## 2013-06-11 NOTE — Progress Notes (Signed)
Subjective: He says he feels much better. He has no new complaints. His breathing is better. He's doing better with physical therapy. His blood sugar has been up probably because of steroids  Objective: Vital signs in last 24 hours: Temp:  [97.7 F (36.5 C)-98.1 F (36.7 C)] 97.9 F (36.6 C) (06/12 0630) Pulse Rate:  [90-96] 90 (06/12 0828) Resp:  [19-20] 19 (06/12 0828) BP: (120-131)/(71-79) 125/79 mmHg (06/12 0630) SpO2:  [86 %-99 %] 99 % (06/12 0828) Weight:  [74.844 kg (165 lb)] 74.844 kg (165 lb) (06/12 0500) Weight change: -0.227 kg (-8 oz) Last BM Date: 06/06/13  Intake/Output from previous day: 06/11 0701 - 06/12 0700 In: 930 [P.O.:480; IV Piggyback:450] Out: 1400 [Urine:1400]  PHYSICAL EXAM General appearance: alert, cooperative and mild distress Resp: rhonchi bilaterally Cardio: irregularly irregular rhythm GI: soft, non-tender; bowel sounds normal; no masses,  no organomegaly Extremities: extremities normal, atraumatic, no cyanosis or edema  Lab Results:    Basic Metabolic Panel:  Recent Labs  11/91/47 0500 06/10/13 0442  NA 139 139  K 3.8 3.7  CL 98 97  CO2 33* 36*  GLUCOSE 78 72  BUN 19 19  CREATININE 0.93 0.96  CALCIUM 8.4 8.2*   Liver Function Tests: No results found for this basename: AST, ALT, ALKPHOS, BILITOT, PROT, ALBUMIN,  in the last 72 hours No results found for this basename: LIPASE, AMYLASE,  in the last 72 hours No results found for this basename: AMMONIA,  in the last 72 hours CBC:  Recent Labs  06/09/13 0500 06/10/13 0442  WBC 8.7 7.3  NEUTROABS 4.8 4.2  HGB 8.9* 8.7*  HCT 29.4* 27.9*  MCV 94.2 93.0  PLT 191 175   Cardiac Enzymes: No results found for this basename: CKTOTAL, CKMB, CKMBINDEX, TROPONINI,  in the last 72 hours BNP: No results found for this basename: PROBNP,  in the last 72 hours D-Dimer: No results found for this basename: DDIMER,  in the last 72 hours CBG:  Recent Labs  06/09/13 2052 06/10/13 0736  06/10/13 1142 06/10/13 1640 06/10/13 2235 06/11/13 0731  GLUCAP 117* 92 233* 358* 344* 220*   Hemoglobin A1C: No results found for this basename: HGBA1C,  in the last 72 hours Fasting Lipid Panel: No results found for this basename: CHOL, HDL, LDLCALC, TRIG, CHOLHDL, LDLDIRECT,  in the last 72 hours Thyroid Function Tests: No results found for this basename: TSH, T4TOTAL, FREET4, T3FREE, THYROIDAB,  in the last 72 hours Anemia Panel: No results found for this basename: VITAMINB12, FOLATE, FERRITIN, TIBC, IRON, RETICCTPCT,  in the last 72 hours Coagulation: No results found for this basename: LABPROT, INR,  in the last 72 hours Urine Drug Screen: Drugs of Abuse  No results found for this basename: labopia, cocainscrnur, labbenz, amphetmu, thcu, labbarb    Alcohol Level: No results found for this basename: ETH,  in the last 72 hours Urinalysis: No results found for this basename: COLORURINE, APPERANCEUR, LABSPEC, PHURINE, GLUCOSEU, HGBUR, BILIRUBINUR, KETONESUR, PROTEINUR, UROBILINOGEN, NITRITE, LEUKOCYTESUR,  in the last 72 hours Misc. Labs:  ABGS No results found for this basename: PHART, PCO2, PO2ART, TCO2, HCO3,  in the last 72 hours CULTURES Recent Results (from the past 240 hour(s))  CULTURE, BLOOD (ROUTINE X 2)     Status: None   Collection Time    06/07/13  2:55 PM      Result Value Range Status   Specimen Description BLOOD LEFT ARM   Final   Special Requests BOTTLES DRAWN AEROBIC ONLY 6CC  Final   Culture NO GROWTH 3 DAYS   Final   Report Status PENDING   Incomplete  CULTURE, BLOOD (ROUTINE X 2)     Status: None   Collection Time    06/07/13  3:20 PM      Result Value Range Status   Specimen Description BLOOD LEFT ARM   Final   Special Requests BOTTLES DRAWN AEROBIC AND ANAEROBIC 6CC   Final   Culture NO GROWTH 3 DAYS   Final   Report Status PENDING   Incomplete  MRSA PCR SCREENING     Status: None   Collection Time    06/07/13  8:01 PM      Result Value  Range Status   MRSA by PCR NEGATIVE  NEGATIVE Final   Comment:            The GeneXpert MRSA Assay (FDA     approved for NASAL specimens     only), is one component of a     comprehensive MRSA colonization     surveillance program. It is not     intended to diagnose MRSA     infection nor to guide or     monitor treatment for     MRSA infections.   Studies/Results: Dg Chest Port 1 View  06/10/2013   *RADIOLOGY REPORT*  Clinical Data: PICC placement  PORTABLE CHEST - 1 VIEW  Comparison: 06/07/2013 and CT chest 05/21/2013.  Findings: Right PICC tip is directed superiorly in the region of the SVC, suggesting a possible azygos location.  Heart size is grossly stable.  Large left pleural effusion and bibasilar dependent air space disease persist.  No definite right pleural fluid.  Minimal biapical pleural parenchymal scarring.  IMPRESSION:  1.  Right PICC is directed superiorly in the region of the SVC, and may be within the azygos vein.  Retracting approximately 3.5 cm should better position the tip in the region of the SVC. These results will be called to the ordering clinician or representative by the Radiologist Assistant, and communication documented in the PACS Dashboard.  2.  Large left pleural effusion with bilateral air space disease, left greater than right, stable.   Original Report Authenticated By: Leanna Battles, M.D.   Dg Chest Port 1v Same Day  06/10/2013   *RADIOLOGY REPORT*  Clinical Data: PICC line placement  PORTABLE CHEST - 1 VIEW SAME DAY  Comparison: 06/10/2013 at 10:23 a.m.  Findings: 1159 hours.  PICC line has been repositioned.  Its tip is difficult to visualize, but likely terminates over the mid SVC. No pneumothorax.  Prior median sternotomy. Cardiomegaly accentuated by AP portable technique.  Moderate left-sided pleural effusion.  Moderate interstitial edema.  Left greater than right lower lobe predominant airspace disease.  IMPRESSION: Right-sided PICC line terminating at  mid SVC.  Otherwise, similar appearance of congestive heart failure with left pleural effusion and lower lobe predominant airspace disease.   Original Report Authenticated By: Jeronimo Greaves, M.D.    Medications:  Prior to Admission:  Prescriptions prior to admission  Medication Sig Dispense Refill  . aspirin 81 MG chewable tablet Chew 81 mg by mouth daily.      Marland Kitchen diltiazem (CARDIZEM CD) 300 MG 24 hr capsule Take 300 mg by mouth daily.      . feeding supplement (PRO-STAT SUGAR FREE 64) LIQD Take 30 mLs by mouth at bedtime.      . fluticasone (FLOVENT HFA) 110 MCG/ACT inhaler Inhale 1 puff into the lungs 2 (two)  times daily.      . furosemide (LASIX) 80 MG tablet Take 80 mg by mouth daily.      Marland Kitchen glyBURIDE (DIABETA) 5 MG tablet Take 1 tablet (5 mg total) by mouth 2 (two) times daily with a meal.      . insulin aspart (NOVOLOG) 100 UNIT/ML injection Inject 3-15 Units into the skin 3 (three) times daily with meals. Sliding scale as follows: 121-150=3 units 151-200=4 units 201-250=7 units 251-300=9 units 301-350=12 units 351-400=15 units      . insulin glargine (LANTUS) 100 UNIT/ML injection Inject 0.1 mLs (10 Units total) into the skin daily.  10 mL  12  . ipratropium-albuterol (DUONEB) 0.5-2.5 (3) MG/3ML SOLN Take 3 mLs by nebulization 4 (four) times daily.      . magnesium hydroxide (MILK OF MAGNESIA) 400 MG/5ML suspension Take 30 mLs by mouth daily as needed for constipation.      . polyethylene glycol (MIRALAX / GLYCOLAX) packet Take 17 g by mouth daily.      . potassium chloride SA (K-DUR,KLOR-CON) 20 MEQ tablet Take 20 mEq by mouth daily.      Marland Kitchen acetaminophen (TYLENOL) 500 MG tablet Take 1,000 mg by mouth every 6 (six) hours as needed. Pain      . sodium chloride (OCEAN) 0.65 % SOLN nasal spray Place 1 spray into the nose as needed for congestion.    0   Scheduled: . antiseptic oral rinse  15 mL Mouth Rinse BID  . aspirin  81 mg Oral Daily  . ceFEPime (MAXIPIME) IV  1 g Intravenous Q8H   . diltiazem  360 mg Oral Daily  . enoxaparin (LOVENOX) injection  40 mg Subcutaneous Q24H  . feeding supplement  30 mL Oral QHS  . fluticasone  1 puff Inhalation BID  . furosemide  40 mg Intravenous Q12H  . guaiFENesin  1,200 mg Oral BID  . insulin aspart  0-15 Units Subcutaneous TID WC  . insulin aspart  0-5 Units Subcutaneous QHS  . insulin glargine  15 Units Subcutaneous Q24H  . levalbuterol  0.63 mg Nebulization QID  . methylPREDNISolone (SOLU-MEDROL) injection  125 mg Intravenous Q6H  . polyethylene glycol  17 g Oral Daily  . sodium chloride  10-40 mL Intracatheter Q12H  . sodium chloride  3 mL Intravenous Q12H  . vancomycin  1,250 mg Intravenous Q24H   Continuous:  ZOX:WRUEAV chloride, acetaminophen, levalbuterol, ondansetron (ZOFRAN) IV, sodium chloride, sodium chloride  Assesment: He was admitted with healthcare associated pneumonia, acute on chronic diastolic congestive heart failure, atrial flutter with rapid ventricular response and he has COPD. He is better with the addition of steroids. His blood sugars not as well controlled. Principal Problem:   HCAP (healthcare-associated pneumonia) Active Problems:   Atrial flutter   COPD (chronic obstructive pulmonary disease)   Hypertension   Type 2 diabetes mellitus, uncontrolled   Acute-on-chronic respiratory failure   Acute on chronic diastolic CHF (congestive heart failure), NYHA class 1    Plan: Continue current treatments. I will probably reduce his steroids tomorrow. Continue PT etc.    LOS: 4 days   Jyren Cerasoli L 06/11/2013, 9:02 AM

## 2013-06-11 NOTE — Progress Notes (Signed)
Inpatient Diabetes Program Recommendations  AACE/ADA: New Consensus Statement on Inpatient Glycemic Control (2013)  Target Ranges:  Prepandial:   less than 140 mg/dL      Peak postprandial:   less than 180 mg/dL (1-2 hours)      Critically ill patients:  140 - 180 mg/dL   Results for [name] [name], [name] [name] (MRN [id number]) as of 06/11/2013 08:06  Ref. Range 06/10/2013 07:36 06/10/2013 11:42 06/10/2013 16:40 06/10/2013 22:35 06/11/2013 07:31  Glucose-Capillary Latest Range: 70-99 mg/dL 92 657 (H) 846 (H) 962 (H) 220 (H)   Inpatient Diabetes Program Recommendations Correction (SSI): Please consider increasing Novolog correction to moderate scale and add Novolog bedtime correction.   Note: Patient was started on Solumedrol 125mg  Q6H on 06/10/13 which is likely the cause of hyperglycemia over the last 24 hours.  Blood glucose has ranged from 233-358 mg/dl since Solumedrol was started.  While patient is receiving steroids, please consider increasing Novolog correction to moderate scale and add bedtime correction.  Will continue to follow.'  Thanks, Orlando Penner, RN, MSN, CCRN Diabetes Coordinator Inpatient Diabetes Program [phone]

## 2013-06-11 NOTE — Progress Notes (Signed)
UR Chart Review Completed  

## 2013-06-12 LAB — BASIC METABOLIC PANEL
Calcium: 8.6 mg/dL (ref 8.4–10.5)
GFR calc Af Amer: 57 mL/min — ABNORMAL LOW (ref >90)
GFR calc non Af Amer: 49 mL/min — ABNORMAL LOW (ref >90)
Glucose, Bld: 228 mg/dL — ABNORMAL HIGH (ref 70–99)
Potassium: 4.1 mEq/L (ref 3.5–5.1)
Sodium: 136 mEq/L (ref 135–145)

## 2013-06-12 LAB — GLUCOSE, CAPILLARY

## 2013-06-12 LAB — CBC WITH DIFFERENTIAL/PLATELET
Basophils Relative: 0 % (ref 0–1)
Eosinophils Absolute: 0 10*3/uL (ref 0.0–0.7)
Hemoglobin: 7.6 g/dL — ABNORMAL LOW (ref 13.0–17.0)
Lymphs Abs: 0.2 10*3/uL — ABNORMAL LOW (ref 0.7–4.0)
MCH: 29.9 pg (ref 26.0–34.0)
Neutro Abs: 4.4 10*3/uL (ref 1.7–7.7)
Neutrophils Relative %: 88 % — ABNORMAL HIGH (ref 43–77)
Platelets: 183 10*3/uL (ref 150–400)
RBC: 2.54 MIL/uL — ABNORMAL LOW (ref 4.22–5.81)
WBC: 5 10*3/uL (ref 4.0–10.5)

## 2013-06-12 MED ORDER — METHYLPREDNISOLONE SODIUM SUCC 40 MG IJ SOLR
40.0000 mg | Freq: Four times a day (QID) | INTRAMUSCULAR | Status: DC
  Administered 2013-06-12 – 2013-06-14 (×8): 40 mg via INTRAVENOUS
  Filled 2013-06-12: qty 2
  Filled 2013-06-12 (×6): qty 1

## 2013-06-12 MED ORDER — CEFEPIME HCL 2 G IJ SOLR
2.0000 g | INTRAMUSCULAR | Status: DC
  Administered 2013-06-12 – 2013-06-18 (×7): 2 g via INTRAVENOUS
  Filled 2013-06-12 (×10): qty 2

## 2013-06-12 MED ORDER — FUROSEMIDE 10 MG/ML IJ SOLN
40.0000 mg | Freq: Every day | INTRAMUSCULAR | Status: DC
  Administered 2013-06-13 – 2013-06-14 (×2): 40 mg via INTRAVENOUS
  Filled 2013-06-12 (×2): qty 4

## 2013-06-12 NOTE — Progress Notes (Signed)
Central telemetry notified that pt had what appeared to be extra p waves on monitor.  Pt asymptomatic.  No complaints at this time.  Dr. Ouida Sills notified.  Received orders for stat EKG 12 lead.

## 2013-06-12 NOTE — Progress Notes (Signed)
Inpatient Diabetes Program Recommendations  AACE/ADA: New Consensus Statement on Inpatient Glycemic Control (2013)  Target Ranges:  Prepandial:   less than 140 mg/dL      Peak postprandial:   less than 180 mg/dL (1-2 hours)      Critically ill patients:  140 - 180 mg/dL   Results for REFUGIO, VANDEVOORDE (MRN [id number]) as of 06/12/2013 13:56  Ref. Range 06/11/2013 07:31 06/11/2013 11:32 06/11/2013 16:47 06/11/2013 21:21 06/12/2013 07:34 06/12/2013 12:01  Glucose-Capillary Latest Range: 70-99 mg/dL 782 (H) 956 (H) 213 (H) 284 (H) 252 (H) 387 (H)   Inpatient Diabetes Program Recommendations Correction (SSI): May want to consider increasing Novolog correction scale to resistant scale if patient is continued on steroids. Insulin - Meal Coverage: May want to consider ordering meal coverage if steroids are continued.  Note: Blood glucose over the past 30 hours has ranged from 166-387 mg/dl and fasting blood glucose this morning was 252 mg/dl.  Noted that steroids are being tapered and were decreased to Solumedrol 40 mg Q6H on 06/12/13.  If patient continues on steroids, may want to consider adding Novolog meal coverage and/or increasing Novolog correction scale to resistant scale.  Will continue to follow.  Thanks, Orlando Penner, RN, MSN, CCRN Diabetes Coordinator Inpatient Diabetes Program [phone]

## 2013-06-12 NOTE — Progress Notes (Signed)
Physical Therapy Treatment Patient Details Name: [name] [name] MRN: [id number] DOB: 21-Nov-1936 Today's Date: 06/12/2013 Time: 7829-5621 PT Time Calculation (min): 42 min  PT Assessment / Plan / Recommendation Comments on Treatment Session  Pt continues to feel better.  O2 sats still drop with gait (down to 83% after walking 40' with walker).  However this rebounds after sitting for a couple of min. up to 93%.  He is doing well with ther ex but not quite ready for closed chair strengthening ex.  Family will bring small (1-2#) weights and theraband in from home for increased strengthening while open chain/    Follow Up Recommendations        Does the patient have the potential to tolerate intense rehabilitation     Barriers to Discharge        Equipment Recommendations       Recommendations for Other Services    Frequency     Plan Discharge plan remains appropriate;Frequency remains appropriate    Precautions / Restrictions     Pertinent Vitals/Pain     Mobility  Ambulation/Gait Ambulation/Gait Assistance: 5: Supervision Ambulation Distance (Feet): 40 Feet Assistive device: Rolling walker General Gait Details: cues to keep pace slow and breathe through the nose Stairs: No Wheelchair Mobility Wheelchair Mobility: No    Exercises General Exercises - Lower Extremity Long Arc Quad: AROM;Both;20 reps;Seated Hip ABduction/ADduction: AROM;Strengthening;Both;10 reps;Seated Hip Flexion/Marching: AROM;Both;Other (comment) (x 1 minute)   PT Diagnosis:    PT Problem List:   PT Treatment Interventions:     PT Goals Acute Rehab PT Goals PT Goal: Ambulate - Progress: Progressing toward goal PT Goal: Up/Down Stairs - Progress: Progressing toward goal PT Goal: Perform Home Exercise Program - Progress: Progressing toward goal  Visit Information  Last PT Received On: 06/12/13    Subjective Data  Subjective: continues to improve   Cognition       Balance     End of  Session PT - End of Session Equipment Utilized During Treatment: Gait belt;Oxygen Activity Tolerance: Patient tolerated treatment well Patient left: in chair;with call bell/phone within reach;with family/visitor present   GP     Myrlene Broker L 06/12/2013, 4:10 PM

## 2013-06-12 NOTE — Progress Notes (Signed)
ANTIBIOTIC CONSULT NOTE   Pharmacy Consult for Vancomycin and Cefepime Indication: pneumonia  No Known Allergies  Patient Measurements: Height: 5\' 6"  (167.6 cm) Weight: 165 lb 9.1 oz (75.1 kg) IBW/kg (Calculated) : 63.8  Vital Signs: Temp: 98.2 F (36.8 C) (06/13 0453) Temp src: Oral (06/13 0453) BP: 110/67 mmHg (06/13 0453) Pulse Rate: 98 (06/13 0453) Intake/Output from previous day: 06/12 0701 - 06/13 0700 In: 1360 [P.O.:960; IV Piggyback:400] Out: 1600 [Urine:1600] Intake/Output from this shift: Total I/O In: 240 [P.O.:240] Out: -   Labs:  Recent Labs  06/10/13 0442 06/12/13 0434  WBC 7.3 5.0  HGB 8.7* 7.6*  PLT 175 183  CREATININE 0.96 1.37*   Estimated Creatinine Clearance: 42 ml/min (by C-G formula based on Cr of 1.37).  Recent Labs  06/10/13 0442  VANCOTROUGH 22.4*    Microbiology: Recent Results (from the past 720 hour(s))  BODY FLUID CULTURE     Status: None   Collection Time    05/22/13 11:38 AM      Result Value Range Status   Specimen Description PLEURAL LEFT FLUID   Final   Special Requests 7.0ML FLUID   Final   Gram Stain     Final   Value: NO WBC SEEN     NO ORGANISMS SEEN   Culture NO GROWTH 3 DAYS   Final   Report Status 05/25/2013 FINAL   Final  CULTURE, BLOOD (ROUTINE X 2)     Status: None   Collection Time    06/07/13  2:55 PM      Result Value Range Status   Specimen Description BLOOD LEFT ARM   Final   Special Requests BOTTLES DRAWN AEROBIC ONLY 6CC   Final   Culture NO GROWTH 4 DAYS   Final   Report Status PENDING   Incomplete  CULTURE, BLOOD (ROUTINE X 2)     Status: None   Collection Time    06/07/13  3:20 PM      Result Value Range Status   Specimen Description BLOOD LEFT ARM   Final   Special Requests BOTTLES DRAWN AEROBIC AND ANAEROBIC 6CC   Final   Culture NO GROWTH 4 DAYS   Final   Report Status PENDING   Incomplete  MRSA PCR SCREENING     Status: None   Collection Time    06/07/13  8:01 PM      Result Value  Range Status   MRSA by PCR NEGATIVE  NEGATIVE Final   Comment:            The GeneXpert MRSA Assay (FDA     approved for NASAL specimens     only), is one component of a     comprehensive MRSA colonization     surveillance program. It is not     intended to diagnose MRSA     infection nor to guide or     monitor treatment for     MRSA infections.   Medical History: Past Medical History  Diagnosis Date  . COPD (chronic obstructive pulmonary disease)   . AF (atrial fibrillation)   . Hypertension   . Gout   . Arthritis   . Small bowel obstruction   . Renal insufficiency   . Aortic stenosis   . Hydropneumothorax   . Diabetes mellitus   . CHF (congestive heart failure)   . On home O2     qhs prn  . Pneumonia 05/04/2013    history of pneumonia  . Pneumonia  10/12  . Hydropneumothorax 2012   Medications:  Scheduled:  . antiseptic oral rinse  15 mL Mouth Rinse BID  . aspirin  81 mg Oral Daily  . ceFEPime (MAXIPIME) IV  2 g Intravenous Q24H  . diltiazem  360 mg Oral Daily  . enoxaparin (LOVENOX) injection  40 mg Subcutaneous Q24H  . feeding supplement  30 mL Oral QHS  . fluticasone  1 puff Inhalation BID  . [START ON 06/13/2013] furosemide  40 mg Intravenous Daily  . guaiFENesin  1,200 mg Oral BID  . insulin aspart  0-15 Units Subcutaneous TID WC  . insulin aspart  0-5 Units Subcutaneous QHS  . insulin glargine  15 Units Subcutaneous Q24H  . levalbuterol  0.63 mg Nebulization QID  . methylPREDNISolone (SOLU-MEDROL) injection  40 mg Intravenous Q6H  . polyethylene glycol  17 g Oral Daily  . sodium chloride  10-40 mL Intracatheter Q12H  . sodium chloride  3 mL Intravenous Q12H  . vancomycin  1,250 mg Intravenous Q24H   Assessment: 77 yo M on day#5 Vancomycin & Cefepime for PNA.  Renal function continues to decline.  Vancomycin was decreased on 6/11 for elevated trough level. Patient slow to improve clinically per MD note.   Goal of Therapy:  Vancomycin trough level  15-20 mcg/ml  Plan:  Decrease Cefepime 2gm IV every 24 hours. Continue Vancomycin to 1250mg  IV q24hrs. Recheck Vancomycin trough level at steady state if continues Monitor renal function and cx data  Duration of therapy per MD  Elson Clan 06/12/2013,10:30 AM

## 2013-06-12 NOTE — Progress Notes (Signed)
Subjective: He continues to slowly improve. He is still short of breath but it is better. He had some loose stool yesterday. He is doing better with physical therapy.  Objective: Vital signs in last 24 hours: Temp:  [98 F (36.7 C)-98.2 F (36.8 C)] 98.2 F (36.8 C) (06/13 0453) Pulse Rate:  [91-102] 98 (06/13 0453) Resp:  [18-20] 20 (06/13 0453) BP: (103-118)/(51-69) 110/67 mmHg (06/13 0453) SpO2:  [86 %-98 %] 93 % (06/13 0753) Weight:  [75.1 kg (165 lb 9.1 oz)] 75.1 kg (165 lb 9.1 oz) (06/13 0453) Weight change: 0.257 kg (9.1 oz) Last BM Date: 06/10/13  Intake/Output from previous day: 06/12 0701 - 06/13 0700 In: 1360 [P.O.:960; IV Piggyback:400] Out: 1600 [Urine:1600]  PHYSICAL EXAM General appearance: alert, cooperative and mild distress Resp: rhonchi bilaterally Cardio: irregularly irregular rhythm GI: soft, non-tender; bowel sounds normal; no masses,  no organomegaly Extremities: extremities normal, atraumatic, no cyanosis or edema  Lab Results:    Basic Metabolic Panel:  Recent Labs  16/10/96 0442 06/12/13 0434  NA 139 136  K 3.7 4.1  CL 97 94*  CO2 36* 35*  GLUCOSE 72 228*  BUN 19 49*  CREATININE 0.96 1.37*  CALCIUM 8.2* 8.6   Liver Function Tests: No results found for this basename: AST, ALT, ALKPHOS, BILITOT, PROT, ALBUMIN,  in the last 72 hours No results found for this basename: LIPASE, AMYLASE,  in the last 72 hours No results found for this basename: AMMONIA,  in the last 72 hours CBC:  Recent Labs  06/10/13 0442 06/12/13 0434  WBC 7.3 5.0  NEUTROABS 4.2 4.4  HGB 8.7* 7.6*  HCT 27.9* 23.5*  MCV 93.0 92.5  PLT 175 183   Cardiac Enzymes: No results found for this basename: CKTOTAL, CKMB, CKMBINDEX, TROPONINI,  in the last 72 hours BNP: No results found for this basename: PROBNP,  in the last 72 hours D-Dimer: No results found for this basename: DDIMER,  in the last 72 hours CBG:  Recent Labs  06/10/13 2235 06/11/13 0731  06/11/13 1132 06/11/13 1647 06/11/13 2121 06/12/13 0734  GLUCAP 344* 220* 321* 166* 284* 252*   Hemoglobin A1C: No results found for this basename: HGBA1C,  in the last 72 hours Fasting Lipid Panel: No results found for this basename: CHOL, HDL, LDLCALC, TRIG, CHOLHDL, LDLDIRECT,  in the last 72 hours Thyroid Function Tests: No results found for this basename: TSH, T4TOTAL, FREET4, T3FREE, THYROIDAB,  in the last 72 hours Anemia Panel: No results found for this basename: VITAMINB12, FOLATE, FERRITIN, TIBC, IRON, RETICCTPCT,  in the last 72 hours Coagulation: No results found for this basename: LABPROT, INR,  in the last 72 hours Urine Drug Screen: Drugs of Abuse  No results found for this basename: labopia, cocainscrnur, labbenz, amphetmu, thcu, labbarb    Alcohol Level: No results found for this basename: ETH,  in the last 72 hours Urinalysis: No results found for this basename: COLORURINE, APPERANCEUR, LABSPEC, PHURINE, GLUCOSEU, HGBUR, BILIRUBINUR, KETONESUR, PROTEINUR, UROBILINOGEN, NITRITE, LEUKOCYTESUR,  in the last 72 hours Misc. Labs:  ABGS No results found for this basename: PHART, PCO2, PO2ART, TCO2, HCO3,  in the last 72 hours CULTURES Recent Results (from the past 240 hour(s))  CULTURE, BLOOD (ROUTINE X 2)     Status: None   Collection Time    06/07/13  2:55 PM      Result Value Range Status   Specimen Description BLOOD LEFT ARM   Final   Special Requests BOTTLES DRAWN AEROBIC ONLY  6CC   Final   Culture NO GROWTH 4 DAYS   Final   Report Status PENDING   Incomplete  CULTURE, BLOOD (ROUTINE X 2)     Status: None   Collection Time    06/07/13  3:20 PM      Result Value Range Status   Specimen Description BLOOD LEFT ARM   Final   Special Requests BOTTLES DRAWN AEROBIC AND ANAEROBIC 6CC   Final   Culture NO GROWTH 4 DAYS   Final   Report Status PENDING   Incomplete  MRSA PCR SCREENING     Status: None   Collection Time    06/07/13  8:01 PM      Result Value  Range Status   MRSA by PCR NEGATIVE  NEGATIVE Final   Comment:            The GeneXpert MRSA Assay (FDA     approved for NASAL specimens     only), is one component of a     comprehensive MRSA colonization     surveillance program. It is not     intended to diagnose MRSA     infection nor to guide or     monitor treatment for     MRSA infections.   Studies/Results: Dg Chest Port 1 View  06/10/2013   *RADIOLOGY REPORT*  Clinical Data: PICC placement  PORTABLE CHEST - 1 VIEW  Comparison: 06/07/2013 and CT chest 05/21/2013.  Findings: Right PICC tip is directed superiorly in the region of the SVC, suggesting a possible azygos location.  Heart size is grossly stable.  Large left pleural effusion and bibasilar dependent air space disease persist.  No definite right pleural fluid.  Minimal biapical pleural parenchymal scarring.  IMPRESSION:  1.  Right PICC is directed superiorly in the region of the SVC, and may be within the azygos vein.  Retracting approximately 3.5 cm should better position the tip in the region of the SVC. These results will be called to the ordering clinician or representative by the Radiologist Assistant, and communication documented in the PACS Dashboard.  2.  Large left pleural effusion with bilateral air space disease, left greater than right, stable.   Original Report Authenticated By: Leanna Battles, M.D.   Dg Chest Port 1v Same Day  06/10/2013   *RADIOLOGY REPORT*  Clinical Data: PICC line placement  PORTABLE CHEST - 1 VIEW SAME DAY  Comparison: 06/10/2013 at 10:23 a.m.  Findings: 1159 hours.  PICC line has been repositioned.  Its tip is difficult to visualize, but likely terminates over the mid SVC. No pneumothorax.  Prior median sternotomy. Cardiomegaly accentuated by AP portable technique.  Moderate left-sided pleural effusion.  Moderate interstitial edema.  Left greater than right lower lobe predominant airspace disease.  IMPRESSION: Right-sided PICC line terminating at  mid SVC.  Otherwise, similar appearance of congestive heart failure with left pleural effusion and lower lobe predominant airspace disease.   Original Report Authenticated By: Jeronimo Greaves, M.D.    Medications:  Prior to Admission:  Prescriptions prior to admission  Medication Sig Dispense Refill  . aspirin 81 MG chewable tablet Chew 81 mg by mouth daily.      Marland Kitchen diltiazem (CARDIZEM CD) 300 MG 24 hr capsule Take 300 mg by mouth daily.      . feeding supplement (PRO-STAT SUGAR FREE 64) LIQD Take 30 mLs by mouth at bedtime.      . fluticasone (FLOVENT HFA) 110 MCG/ACT inhaler Inhale 1 puff into the  lungs 2 (two) times daily.      . furosemide (LASIX) 80 MG tablet Take 80 mg by mouth daily.      Marland Kitchen glyBURIDE (DIABETA) 5 MG tablet Take 1 tablet (5 mg total) by mouth 2 (two) times daily with a meal.      . insulin aspart (NOVOLOG) 100 UNIT/ML injection Inject 3-15 Units into the skin 3 (three) times daily with meals. Sliding scale as follows: 121-150=3 units 151-200=4 units 201-250=7 units 251-300=9 units 301-350=12 units 351-400=15 units      . insulin glargine (LANTUS) 100 UNIT/ML injection Inject 0.1 mLs (10 Units total) into the skin daily.  10 mL  12  . ipratropium-albuterol (DUONEB) 0.5-2.5 (3) MG/3ML SOLN Take 3 mLs by nebulization 4 (four) times daily.      . magnesium hydroxide (MILK OF MAGNESIA) 400 MG/5ML suspension Take 30 mLs by mouth daily as needed for constipation.      . polyethylene glycol (MIRALAX / GLYCOLAX) packet Take 17 g by mouth daily.      . potassium chloride SA (K-DUR,KLOR-CON) 20 MEQ tablet Take 20 mEq by mouth daily.      Marland Kitchen acetaminophen (TYLENOL) 500 MG tablet Take 1,000 mg by mouth every 6 (six) hours as needed. Pain      . sodium chloride (OCEAN) 0.65 % SOLN nasal spray Place 1 spray into the nose as needed for congestion.    0   Scheduled: . antiseptic oral rinse  15 mL Mouth Rinse BID  . aspirin  81 mg Oral Daily  . ceFEPime (MAXIPIME) IV  1 g Intravenous Q8H   . diltiazem  360 mg Oral Daily  . enoxaparin (LOVENOX) injection  40 mg Subcutaneous Q24H  . feeding supplement  30 mL Oral QHS  . fluticasone  1 puff Inhalation BID  . [START ON 06/13/2013] furosemide  40 mg Intravenous Daily  . guaiFENesin  1,200 mg Oral BID  . insulin aspart  0-15 Units Subcutaneous TID WC  . insulin aspart  0-5 Units Subcutaneous QHS  . insulin glargine  15 Units Subcutaneous Q24H  . levalbuterol  0.63 mg Nebulization QID  . methylPREDNISolone (SOLU-MEDROL) injection  40 mg Intravenous Q6H  . polyethylene glycol  17 g Oral Daily  . sodium chloride  10-40 mL Intracatheter Q12H  . sodium chloride  3 mL Intravenous Q12H  . vancomycin  1,250 mg Intravenous Q24H   Continuous:  ZOX:WRUEAV chloride, acetaminophen, levalbuterol, ondansetron (ZOFRAN) IV, sodium chloride, sodium chloride  Assesment: He has acute on chronic respiratory failure. He has healthcare associated pneumonia and that is being treated and improving. This was complicated by acute on chronic diastolic congestive heart failure and that is also improving. His renal function is not quite as good with diuresis so I'm going to decrease his diuretics. His hemoglobin level is lower today but it has been fluctuating. He has not noticed any blood or dark stools. Principal Problem:   HCAP (healthcare-associated pneumonia) Active Problems:   Atrial flutter   COPD (chronic obstructive pulmonary disease)   Hypertension   Type 2 diabetes mellitus, uncontrolled   Acute-on-chronic respiratory failure   Acute on chronic diastolic CHF (congestive heart failure), NYHA class 1    Plan: I am going to reduce his steroids. He will have stools for blood. Repeat laboratory work tomorrow. Reduce his Lasix to 40 mg daily. Continue his other treatments. I would anticipate reducing his steroids again tomorrow if he continues to improve    LOS: 5 days  Anedra Penafiel L 06/12/2013, 8:37 AM

## 2013-06-13 LAB — GLUCOSE, CAPILLARY
Glucose-Capillary: 446 mg/dL — ABNORMAL HIGH (ref 70–99)
Glucose-Capillary: 292 mg/dL — ABNORMAL HIGH (ref 70–99)

## 2013-06-13 LAB — CBC WITH DIFFERENTIAL/PLATELET
Basophils Absolute: 0 10*3/uL (ref 0.0–0.1)
Eosinophils Relative: 0 % (ref 0–5)
HCT: 24.1 % — ABNORMAL LOW (ref 39.0–52.0)
Hemoglobin: 7.6 g/dL — ABNORMAL LOW (ref 13.0–17.0)
Lymphocytes Relative: 6 % — ABNORMAL LOW (ref 12–46)
Lymphs Abs: 0.3 10*3/uL — ABNORMAL LOW (ref 0.7–4.0)
MCV: 91.6 fL (ref 78.0–100.0)
Monocytes Absolute: 0.5 10*3/uL (ref 0.1–1.0)
Monocytes Relative: 10 % (ref 3–12)
Neutro Abs: 4.3 10*3/uL (ref 1.7–7.7)
RBC: 2.63 MIL/uL — ABNORMAL LOW (ref 4.22–5.81)
WBC: 5 10*3/uL (ref 4.0–10.5)

## 2013-06-13 LAB — BASIC METABOLIC PANEL
CO2: 35 mEq/L — ABNORMAL HIGH (ref 19–32)
Chloride: 94 mEq/L — ABNORMAL LOW (ref 96–112)
Creatinine, Ser: 1.47 mg/dL — ABNORMAL HIGH (ref 0.50–1.35)
GFR calc Af Amer: 52 mL/min — ABNORMAL LOW (ref >90)
Potassium: 4.1 mEq/L (ref 3.5–5.1)

## 2013-06-13 LAB — OCCULT BLOOD X 1 CARD TO LAB, STOOL: Fecal Occult Bld: NEGATIVE

## 2013-06-13 MED ORDER — INSULIN ASPART 100 UNIT/ML ~~LOC~~ SOLN
15.0000 [IU] | Freq: Once | SUBCUTANEOUS | Status: AC
  Administered 2013-06-13: 15 [IU] via SUBCUTANEOUS

## 2013-06-13 MED ORDER — ONDANSETRON HCL 4 MG/2ML IJ SOLN
4.0000 mg | Freq: Two times a day (BID) | INTRAMUSCULAR | Status: DC
  Administered 2013-06-13 – 2013-06-20 (×11): 4 mg via INTRAVENOUS
  Filled 2013-06-13 (×11): qty 2

## 2013-06-13 NOTE — Progress Notes (Signed)
Subjective: Patient is resting he feels better, No fever or chills.  Objective: Vital signs in last 24 hours: Temp:  [97.5 F (36.4 C)-97.9 F (36.6 C)] 97.9 F (36.6 C) (06/14 0531) Pulse Rate:  [78-103] 78 (06/14 0531) Resp:  [20] 20 (06/14 0531) BP: (114-118)/(58-68) 114/60 mmHg (06/14 0531) SpO2:  [92 %-97 %] 94 % (06/14 0649) Weight:  [76.522 kg (168 lb 11.2 oz)] 76.522 kg (168 lb 11.2 oz) (06/14 0531) Weight change: 1.422 kg (3 lb 2.2 oz) Last BM Date: 06/10/13  Intake/Output from previous day: 06/13 0701 - 06/14 0700 In: 1290 [P.O.:960; I.V.:30; IV Piggyback:300] Out: 1075 [Urine:1075]  PHYSICAL EXAM General appearance: alert and no distress Resp: diminished breath sounds bilaterally and rhonchi bilaterally Cardio: S1, S2 normal GI: soft, non-tender; bowel sounds normal; no masses,  no organomegaly Extremities: extremities normal, atraumatic, no cyanosis or edema  Lab Results:    @labtest @ [facility] No results found for this basename: PHART, PCO2, PO2ART, TCO2, HCO3,  in the last 72 hours CULTURES Recent Results (from the past 240 hour(s))  CULTURE, BLOOD (ROUTINE X 2)     Status: None   Collection Time    06/07/13  2:55 PM      Result Value Range Status   Specimen Description BLOOD LEFT ARM   Final   Special Requests BOTTLES DRAWN AEROBIC ONLY 6CC   Final   Culture NO GROWTH 4 DAYS   Final   Report Status PENDING   Incomplete  CULTURE, BLOOD (ROUTINE X 2)     Status: None   Collection Time    06/07/13  3:20 PM      Result Value Range Status   Specimen Description BLOOD LEFT ARM   Final   Special Requests BOTTLES DRAWN AEROBIC AND ANAEROBIC 6CC   Final   Culture NO GROWTH 4 DAYS   Final   Report Status PENDING   Incomplete  MRSA PCR SCREENING     Status: None   Collection Time    06/07/13  8:01 PM      Result Value Range Status   MRSA by PCR NEGATIVE  NEGATIVE Final   Comment:            The GeneXpert MRSA Assay (FDA     approved for NASAL specimens   only), is one component of a     comprehensive MRSA colonization     surveillance program. It is not     intended to diagnose MRSA     infection nor to guide or     monitor treatment for     MRSA infections.   Studies/Results: No results found.  Medications:  I have reviewed the patient's current medications. Prior to Admission:  Prescriptions prior to admission  Medication Sig Dispense Refill  . aspirin 81 MG chewable tablet Chew 81 mg by mouth daily.      Marland Kitchen diltiazem (CARDIZEM CD) 300 MG 24 hr capsule Take 300 mg by mouth daily.      . feeding supplement (PRO-STAT SUGAR FREE 64) LIQD Take 30 mLs by mouth at bedtime.      . fluticasone (FLOVENT HFA) 110 MCG/ACT inhaler Inhale 1 puff into the lungs 2 (two) times daily.      . furosemide (LASIX) 80 MG tablet Take 80 mg by mouth daily.      Marland Kitchen glyBURIDE (DIABETA) 5 MG tablet Take 1 tablet (5 mg total) by mouth 2 (two) times daily with a meal.      .  insulin aspart (NOVOLOG) 100 UNIT/ML injection Inject 3-15 Units into the skin 3 (three) times daily with meals. Sliding scale as follows: 121-150=3 units 151-200=4 units 201-250=7 units 251-300=9 units 301-350=12 units 351-400=15 units      . insulin glargine (LANTUS) 100 UNIT/ML injection Inject 0.1 mLs (10 Units total) into the skin daily.  10 mL  12  . ipratropium-albuterol (DUONEB) 0.5-2.5 (3) MG/3ML SOLN Take 3 mLs by nebulization 4 (four) times daily.      . magnesium hydroxide (MILK OF MAGNESIA) 400 MG/5ML suspension Take 30 mLs by mouth daily as needed for constipation.      . polyethylene glycol (MIRALAX / GLYCOLAX) packet Take 17 g by mouth daily.      . potassium chloride SA (K-DUR,KLOR-CON) 20 MEQ tablet Take 20 mEq by mouth daily.      Marland Kitchen acetaminophen (TYLENOL) 500 MG tablet Take 1,000 mg by mouth every 6 (six) hours as needed. Pain      . sodium chloride (OCEAN) 0.65 % SOLN nasal spray Place 1 spray into the nose as needed for congestion.    0    Assesment:   Principal  Problem:   HCAP (healthcare-associated pneumonia) Active Problems:   Atrial flutter   COPD (chronic obstructive pulmonary disease)   Hypertension   Type 2 diabetes mellitus, uncontrolled   Acute-on-chronic respiratory failure   Acute on chronic diastolic CHF (congestive heart failure), NYHA class 1    Plan: Continue Iv antibiotics We will continue to taper steroid    LOS: 6 days   [name] [name] 06/13/2013, 8:15 AM

## 2013-06-14 LAB — CULTURE, BLOOD (ROUTINE X 2)
Culture: NO GROWTH
Culture: NO GROWTH

## 2013-06-14 LAB — GLUCOSE, CAPILLARY
Glucose-Capillary: 264 mg/dL — ABNORMAL HIGH (ref 70–99)
Glucose-Capillary: 334 mg/dL — ABNORMAL HIGH (ref 70–99)

## 2013-06-14 LAB — CREATININE, SERUM: GFR calc Af Amer: 42 mL/min — ABNORMAL LOW (ref >90)

## 2013-06-14 MED ORDER — PREDNISONE 20 MG PO TABS
40.0000 mg | ORAL_TABLET | Freq: Every day | ORAL | Status: DC
  Administered 2013-06-14 – 2013-06-16 (×3): 40 mg via ORAL
  Filled 2013-06-14: qty 1
  Filled 2013-06-14 (×2): qty 2
  Filled 2013-06-14: qty 1

## 2013-06-14 MED ORDER — VANCOMYCIN HCL IN DEXTROSE 1-5 GM/200ML-% IV SOLN
1000.0000 mg | INTRAVENOUS | Status: DC
  Administered 2013-06-14: 1000 mg via INTRAVENOUS
  Filled 2013-06-14: qty 200

## 2013-06-14 MED ORDER — PREDNISOLONE 5 MG PO TABS: 40.0000 mg | ORAL_TABLET | Freq: Every day | ORAL | Status: DC

## 2013-06-14 NOTE — Progress Notes (Signed)
ANTIBIOTIC CONSULT NOTE   Pharmacy Consult for Vancomycin and Cefepime Indication: pneumonia  No Known Allergies  Patient Measurements: Height: 5\' 6"  (167.6 cm) Weight: 171 lb (77.565 kg) IBW/kg (Calculated) : 63.8  Vital Signs: Temp: 98.4 F (36.9 C) (06/15 0522) Temp src: Oral (06/15 0522) BP: 131/69 mmHg (06/15 0522) Pulse Rate: 80 (06/15 0522) Intake/Output from previous day: 06/14 0701 - 06/15 0700 In: 1450 [P.O.:1120; I.V.:30; IV Piggyback:300] Out: 751 [Urine:750; Stool:1] Intake/Output from this shift:    Labs:  Recent Labs  06/12/13 0434 06/13/13 0519 06/14/13 0518  WBC 5.0 5.0  --   HGB 7.6* 7.6*  --   PLT 183 176  --   CREATININE 1.37* 1.47* 1.75*   Estimated Creatinine Clearance: 35.8 ml/min (by C-G formula based on Cr of 1.75).  Recent Labs  06/14/13 1030  VANCOTROUGH 25.9*    Microbiology: Recent Results (from the past 720 hour(s))  BODY FLUID CULTURE     Status: None   Collection Time    05/22/13 11:38 AM      Result Value Range Status   Specimen Description PLEURAL LEFT FLUID   Final   Special Requests 7.0ML FLUID   Final   Gram Stain     Final   Value: NO WBC SEEN     NO ORGANISMS SEEN   Culture NO GROWTH 3 DAYS   Final   Report Status 05/25/2013 FINAL   Final  CULTURE, BLOOD (ROUTINE X 2)     Status: None   Collection Time    06/07/13  2:55 PM      Result Value Range Status   Specimen Description BLOOD LEFT ARM   Final   Special Requests BOTTLES DRAWN AEROBIC ONLY 6CC   Final   Culture NO GROWTH 7 DAYS   Final   Report Status 06/14/2013 FINAL   Final  CULTURE, BLOOD (ROUTINE X 2)     Status: None   Collection Time    06/07/13  3:20 PM      Result Value Range Status   Specimen Description BLOOD LEFT ARM   Final   Special Requests BOTTLES DRAWN AEROBIC AND ANAEROBIC 6CC   Final   Culture NO GROWTH 7 DAYS   Final   Report Status 06/14/2013 FINAL   Final  MRSA PCR SCREENING     Status: None   Collection Time    06/07/13  8:01 PM       Result Value Range Status   MRSA by PCR NEGATIVE  NEGATIVE Final   Comment:            The GeneXpert MRSA Assay (FDA     approved for NASAL specimens     only), is one component of a     comprehensive MRSA colonization     surveillance program. It is not     intended to diagnose MRSA     infection nor to guide or     monitor treatment for     MRSA infections.   Medical History: Past Medical History  Diagnosis Date  . COPD (chronic obstructive pulmonary disease)   . AF (atrial fibrillation)   . Hypertension   . Gout   . Arthritis   . Small bowel obstruction   . Renal insufficiency   . Aortic stenosis   . Hydropneumothorax   . Diabetes mellitus   . CHF (congestive heart failure)   . On home O2     qhs prn  . Pneumonia 05/04/2013  history of pneumonia  . Pneumonia 10/12  . Hydropneumothorax 2012   Medications:  Scheduled:  . antiseptic oral rinse  15 mL Mouth Rinse BID  . aspirin  81 mg Oral Daily  . ceFEPime (MAXIPIME) IV  2 g Intravenous Q24H  . diltiazem  360 mg Oral Daily  . enoxaparin (LOVENOX) injection  40 mg Subcutaneous Q24H  . feeding supplement  30 mL Oral QHS  . fluticasone  1 puff Inhalation BID  . furosemide  40 mg Intravenous Daily  . guaiFENesin  1,200 mg Oral BID  . insulin aspart  0-15 Units Subcutaneous TID WC  . insulin aspart  0-5 Units Subcutaneous QHS  . insulin glargine  15 Units Subcutaneous Q24H  . levalbuterol  0.63 mg Nebulization QID  . ondansetron (ZOFRAN) IV  4 mg Intravenous q12n4p  . polyethylene glycol  17 g Oral Daily  . predniSONE  40 mg Oral Q breakfast  . sodium chloride  10-40 mL Intracatheter Q12H  . sodium chloride  3 mL Intravenous Q12H   Assessment: 77 yo M on day#5 Vancomycin & Cefepime for PNA.  Renal function continues to decline.  Vancomycin was decreased on 6/11 for elevated trough level. Patient improving clinically per MD note.  Vancomycin trough today elevated  Goal of Therapy:  Vancomycin trough  level 15-20 mcg/ml  Plan:  Continue Cefepime 2gm IV every 24 hours. Decrease  Vancomycin to 1000 mg IV every 24 hours, next dose 10 PM tonight to allow level to decrease Recheck Vancomycin trough level at steady state if continues Monitor renal function and cx data  Duration of therapy per MD  Raquel James, Kunal Levario Bennett 06/14/2013,11:47 AM

## 2013-06-14 NOTE — Progress Notes (Signed)
Subjective: Patient is progressively improving. His po intake is better. No new complaint. Objective: Vital signs in last 24 hours: Temp:  [97.7 F (36.5 C)-98.4 F (36.9 C)] 98.4 F (36.9 C) (06/15 0522) Pulse Rate:  [80-94] 80 (06/15 0522) Resp:  [18-20] 20 (06/15 0522) BP: (124-133)/(66-69) 131/69 mmHg (06/15 0522) SpO2:  [92 %-98 %] 94 % (06/15 0715) Weight:  [77.565 kg (171 lb)] 77.565 kg (171 lb) (06/15 0522) Weight change: 1.043 kg (2 lb 4.8 oz) Last BM Date: 06/13/13 (per pt.)  Intake/Output from previous day: 06/14 0701 - 06/15 0700 In: 1450 [P.O.:1120; I.V.:30; IV Piggyback:300] Out: 751 [Urine:750; Stool:1]  PHYSICAL EXAM General appearance: alert and no distress Resp: diminished breath sounds bilaterally and rhonchi bilaterally Cardio: S1, S2 normal GI: soft, non-tender; bowel sounds normal; no masses,  no organomegaly Extremities: extremities normal, atraumatic, no cyanosis or edema  Lab Results:    @labtest @ [facility] No results found for this basename: PHART, PCO2, PO2ART, TCO2, HCO3,  in the last 72 hours CULTURES Recent Results (from the past 240 hour(s))  CULTURE, BLOOD (ROUTINE X 2)     Status: None   Collection Time    06/07/13  2:55 PM      Result Value Range Status   Specimen Description BLOOD LEFT ARM   Final   Special Requests BOTTLES DRAWN AEROBIC ONLY 6CC   Final   Culture NO GROWTH 4 DAYS   Final   Report Status PENDING   Incomplete  CULTURE, BLOOD (ROUTINE X 2)     Status: None   Collection Time    06/07/13  3:20 PM      Result Value Range Status   Specimen Description BLOOD LEFT ARM   Final   Special Requests BOTTLES DRAWN AEROBIC AND ANAEROBIC 6CC   Final   Culture NO GROWTH 4 DAYS   Final   Report Status PENDING   Incomplete  MRSA PCR SCREENING     Status: None   Collection Time    06/07/13  8:01 PM      Result Value Range Status   MRSA by PCR NEGATIVE  NEGATIVE Final   Comment:            The GeneXpert MRSA Assay (FDA     approved  for NASAL specimens     only), is one component of a     comprehensive MRSA colonization     surveillance program. It is not     intended to diagnose MRSA     infection nor to guide or     monitor treatment for     MRSA infections.   Studies/Results: No results found.  Medications:  I have reviewed the patient's current medications. Prior to Admission:  Prescriptions prior to admission  Medication Sig Dispense Refill  . aspirin 81 MG chewable tablet Chew 81 mg by mouth daily.      Marland Kitchen diltiazem (CARDIZEM CD) 300 MG 24 hr capsule Take 300 mg by mouth daily.      . feeding supplement (PRO-STAT SUGAR FREE 64) LIQD Take 30 mLs by mouth at bedtime.      . fluticasone (FLOVENT HFA) 110 MCG/ACT inhaler Inhale 1 puff into the lungs 2 (two) times daily.      . furosemide (LASIX) 80 MG tablet Take 80 mg by mouth daily.      Marland Kitchen glyBURIDE (DIABETA) 5 MG tablet Take 1 tablet (5 mg total) by mouth 2 (two) times daily with a meal.      .  insulin aspart (NOVOLOG) 100 UNIT/ML injection Inject 3-15 Units into the skin 3 (three) times daily with meals. Sliding scale as follows: 121-150=3 units 151-200=4 units 201-250=7 units 251-300=9 units 301-350=12 units 351-400=15 units      . insulin glargine (LANTUS) 100 UNIT/ML injection Inject 0.1 mLs (10 Units total) into the skin daily.  10 mL  12  . ipratropium-albuterol (DUONEB) 0.5-2.5 (3) MG/3ML SOLN Take 3 mLs by nebulization 4 (four) times daily.      . magnesium hydroxide (MILK OF MAGNESIA) 400 MG/5ML suspension Take 30 mLs by mouth daily as needed for constipation.      . polyethylene glycol (MIRALAX / GLYCOLAX) packet Take 17 g by mouth daily.      . potassium chloride SA (K-DUR,KLOR-CON) 20 MEQ tablet Take 20 mEq by mouth daily.      Marland Kitchen acetaminophen (TYLENOL) 500 MG tablet Take 1,000 mg by mouth every 6 (six) hours as needed. Pain      . sodium chloride (OCEAN) 0.65 % SOLN nasal spray Place 1 spray into the nose as needed for congestion.    0     Assesment:   Principal Problem:   HCAP (healthcare-associated pneumonia) Active Problems:   Atrial flutter   COPD (chronic obstructive pulmonary disease)   Hypertension   Type 2 diabetes mellitus, uncontrolled   Acute-on-chronic respiratory failure   Acute on chronic diastolic CHF (congestive heart failure), NYHA class 1    Plan: Continue Iv antibiotics Will change steroid to po    LOS: 7 days   [name] [name] 06/14/2013, 8:23 AM

## 2013-06-15 DIAGNOSIS — N179 Acute kidney failure, unspecified: Secondary | ICD-10-CM

## 2013-06-15 DIAGNOSIS — N189 Chronic kidney disease, unspecified: Secondary | ICD-10-CM | POA: Diagnosis not present

## 2013-06-15 DIAGNOSIS — D649 Anemia, unspecified: Secondary | ICD-10-CM

## 2013-06-15 LAB — IRON AND TIBC: UIBC: 208 ug/dL (ref 125–400)

## 2013-06-15 LAB — BASIC METABOLIC PANEL
CO2: 34 mEq/L — ABNORMAL HIGH (ref 19–32)
Chloride: 92 mEq/L — ABNORMAL LOW (ref 96–112)
GFR calc Af Amer: 40 mL/min — ABNORMAL LOW (ref >90)
Potassium: 4.7 mEq/L (ref 3.5–5.1)
Sodium: 132 mEq/L — ABNORMAL LOW (ref 135–145)

## 2013-06-15 LAB — GLUCOSE, CAPILLARY
Glucose-Capillary: 247 mg/dL — ABNORMAL HIGH (ref 70–99)
Glucose-Capillary: 324 mg/dL — ABNORMAL HIGH (ref 70–99)

## 2013-06-15 LAB — OCCULT BLOOD X 1 CARD TO LAB, STOOL: Fecal Occult Bld: POSITIVE — AB

## 2013-06-15 LAB — RETICULOCYTES
Retic Count, Absolute: 191.8 10*3/uL — ABNORMAL HIGH (ref 19.0–186.0)
Retic Ct Pct: 7 % — ABNORMAL HIGH (ref 0.4–3.1)

## 2013-06-15 LAB — FERRITIN: Ferritin: 365 ng/mL — ABNORMAL HIGH (ref 22–322)

## 2013-06-15 MED ORDER — FUROSEMIDE 10 MG/ML IJ SOLN
40.0000 mg | Freq: Every day | INTRAMUSCULAR | Status: DC
  Administered 2013-06-15: 40 mg via INTRAVENOUS
  Filled 2013-06-15: qty 4

## 2013-06-15 MED ORDER — BD GETTING STARTED TAKE HOME KIT: 3/10ML X 30G SYRINGES
1.0000 | Freq: Once | Status: AC
  Administered 2013-06-15: 1
  Filled 2013-06-15: qty 1

## 2013-06-15 MED ORDER — DIGOXIN 250 MCG PO TABS
0.2500 mg | ORAL_TABLET | Freq: Once | ORAL | Status: AC
  Administered 2013-06-15: 0.25 mg via ORAL
  Filled 2013-06-15: qty 1

## 2013-06-15 NOTE — Progress Notes (Signed)
Nutrition Brief Note  Patient identified on the Malnutrition Screening Tool (MST) Report  Body mass index is 27.74 kg/(m^2). Patient meets criteria for overweight based on current BMI.   Current diet order is Heart Healthy/CHO Modified (1600-2000 kcal), patient is consuming approximately 75-100% of meals at this time. Labs and medications reviewed. Possibly ready for discharge home tomorrow.  No nutrition interventions warranted at this time. If nutrition issues arise, please consult RD.   Royann Shivers MS,RD,LDN,CSG Office: [phone] Pager: [phone]

## 2013-06-15 NOTE — Progress Notes (Signed)
Dr. [name] was notified of the patients positive hemoccult stool.

## 2013-06-15 NOTE — Progress Notes (Signed)
Inpatient Diabetes Program Recommendations  AACE/ADA: New Consensus Statement on Inpatient Glycemic Control (2013)  Target Ranges:  Prepandial:   less than 140 mg/dL      Peak postprandial:   less than 180 mg/dL (1-2 hours)      Critically ill patients:  140 - 180 mg/dL  Results for ZAC, TORTI (MRN [id number]) as of 06/15/2013 13:50  Ref. Range 06/14/2013 11:14 06/14/2013 16:08 06/14/2013 20:54 06/15/2013 07:06 06/15/2013 11:42  Glucose-Capillary Latest Range: 70-99 mg/dL 130 (H) 865 (H) 784 (H) 247 (H) 324 (H)    Inpatient Diabetes Program Recommendations Insulin - Basal: . Correction (SSI): Increase to RESISTANT during steroid therapy Insulin - Meal Coverage: consider adding Novolog 4-5 units TID with meals  Oral Agents: Oral agents are not recommended during hospitalization  Thank you  Piedad Climes BSN, RN,CDE Inpatient Diabetes Coordinator [phone] (team pager)

## 2013-06-15 NOTE — Consult Note (Signed)
Patient Name: [name] [name]  MRN: [id number]  HPI: [name] [name] is an 77 y.o. male referred for consultation by Dr.Edward Patrice Paradise, MD for atrial arrhythmias and chronic congestive heart failure.  Patient recently had prolonged hospitalization at [facility] for pneumonia, atrial arrhythmias and CHF. He was readmitted approximately one week ago with dyspnea. Atrial fibrillation has been present, most recently with a controlled ventricular rate. Patient discontinued cigarette smoking 10 years ago.  Past Medical History  Diagnosis Date  . COPD (chronic obstructive pulmonary disease)   . AF (atrial fibrillation)   . Hypertension   . Gout   . Arthritis   . Small bowel obstruction   . Renal insufficiency   . Aortic stenosis   . Hydropneumothorax   . Diabetes mellitus   . CHF (congestive heart failure)   . On home O2     qhs prn  . Pneumonia 05/04/2013    history of pneumonia  . Pneumonia 10/12  . Hydropneumothorax 2012    Past Surgical History  Procedure Laterality Date  . Rotator cuff repair      left and right  . Cardiac valve replacement      aortic valve with a tissue prosthesis and left sided maze proc  . S/p rewiring of sternum for dehiscence    . Gallbladder surgery    . US echocardiography  01-03-11    EF 55-60%  . Cardiovascular stress test  01-26-09    EF 67%  . Coronary angioplasty    . Cholecystectomy    . Sternal incision reclosure  08/29/2010    sternal rewiring  . Cataract extraction w/phaco  10/02/2012    Procedure: CATARACT EXTRACTION PHACO AND INTRAOCULAR LENS PLACEMENT (IOC);  Surgeon: Gemma Payor, MD;  Location: [facility];  Service: Ophthalmology;  Laterality: Right;  CDE 16.68  . Cataract extraction w/phaco  10/27/2012    Procedure: CATARACT EXTRACTION PHACO AND INTRAOCULAR LENS PLACEMENT (IOC);  Surgeon: Gemma Payor, MD;  Location: [facility];  Service: Ophthalmology;  Laterality: Left;  CDE:16.89  . Artificial aortic valve      Family History  Problem  Relation Age of Onset  . Hypertension Mother   . Stroke Mother   . Heart disease Sister   . Kidney disease Sister    Social History:  reports that he quit smoking about 8 years ago. His smoking use included Cigarettes. He smoked 0.00 packs per day. He has never used smokeless tobacco. He reports that he does not drink alcohol or use illicit drugs.  Allergies: No Known Allergies  Medications:  I have reviewed the patient's current medications. Scheduled: . antiseptic oral rinse  15 mL Mouth Rinse BID  . aspirin  81 mg Oral Daily  . ceFEPime (MAXIPIME) IV  2 g Intravenous Q24H  . diltiazem  360 mg Oral Daily  . enoxaparin (LOVENOX) injection  40 mg Subcutaneous Q24H  . feeding supplement  30 mL Oral QHS  . fluticasone  1 puff Inhalation BID  . guaiFENesin  1,200 mg Oral BID  . insulin aspart  0-15 Units Subcutaneous TID WC  . insulin aspart  0-5 Units Subcutaneous QHS  . insulin glargine  15 Units Subcutaneous Q24H  . levalbuterol  0.63 mg Nebulization QID  . ondansetron (ZOFRAN) IV  4 mg Intravenous q12n4p  . polyethylene glycol  17 g Oral Daily  . predniSONE  40 mg Oral Q breakfast  . sodium chloride  10-40 mL Intracatheter Q12H  . sodium chloride  3  mL Intravenous Q12H  . vancomycin  1,000 mg Intravenous Q24H     06/15/13  4:57 AM      Result Value Range   Sodium 132 (*) 135 - 145 mEq/L   Potassium 4.7  3.5 - 5.1 mEq/L   Chloride 92 (*) 96 - 112 mEq/L   CO2 34 (*) 19 - 32 mEq/L   Glucose, Bld 264 (*) 70 - 99 mg/dL   BUN 77 (*) 6 - 23 mg/dL   Creatinine, Ser 4.78 (*) 0.50 - 1.35 mg/dL   Calcium 9.1  8.4 - 29.5 mg/dL   GFR calc non Af Amer 35 (*) >90 mL/min   GFR calc Af Amer 40 (*) >90 mL/min   ProBNP-2121; no recent lower values recorded H&H-7.6/24.1 on 06/13/2013 Echocardiogram: Performed 04/20/13. Moderate LAE, mild LVH, nl EF, mild to moderate pulmonary hypertension, no significant valvular abnormalities CXR: 04/10/2013-moderate left pleural effusion, pulmonary edema;  left >right basilar infiltrate/atelectasis EKG: 06/12/2013-atrial flutter with controlled ventricular response, delayed R wave progression-cannot exclude previous septal MI, no previous tracing for comparison.  Review of Systems: General: no anorexia, weight gain or weight loss Cardiac: no chest pain, dyspnea, orthopnea, PND,  or syncope Respiratory: no cough, sputum production or hemoptysis GI: no nausea, abdominal pain, emesis, diarrhea or constipation Integument: no significant lesions Neurologic: No muscle weakness or paralysis; no speech disturbance; no headache All other systems reviewed and are negative.  Physical Exam: Blood pressure 140/62, pulse 74, temperature 97.5 F (36.4 C), temperature source Oral, resp. rate 20, height 5\' 6"  (1.676 m), weight 77.928 kg (171 lb 12.8 oz), SpO2 96.00%.;  Body mass index is 27.74 kg/(m^2). General-Well-developed; no acute distress; mildly overweight HEENT-/AT; PERRL; EOM intact; conjunctiva and lids nl Neck-mild to moderate JVD; no carotid bruits Endocrine-No thyromegaly Lungs-markedly decreased breath sounds at the bases; moderately to markedly prolonged expiratory phase with expiratory wheezing; resonant percussion; Cardiovascular- normal PMI; normal S1 and S2; irregular rhythm; adequate control of heart rate Abdomen-mildly distended; BS normal; soft and non-tender without masses or organomegaly Musculoskeletal-No deformities, cyanosis or clubbing Neurologic-Nl cranial nerves; symmetric strength and tone Skin- Warm, no significant lesions Extremities-1+ pretibial edema  Weight: 160 pounds when discharged from [facility]; now 172 pounds  Assessment/Plan:  Atrial fibrillation: Sinus rhythm present in 08/2012; only atrial flutter and atrial fibrillation documented since then. Heart rate control is adequate with current medication, which will be continued. Treatment with beta blockers has been appropriately avoided. Patient was previously  maintained on amiodarone, but this was stopped, apparently in 2012. I doubt that an attempt to maintain sinus rhythm will be appropriate.  He is at substantial risk for thromboembolism and should be maintained on anticoagulation, if feasible.  Digoxin may prove necessary, but would be inclined to avoid at present in the face of abnormal and variable renal function.  Acute renal failure: Onset was 3 days ago. Possibly related to treatment with vancomycin; the advisability of continued use should be reassessed.  Prerenal azotemia is also a consideration, but increased weights suggest fluid overload. Diuretics have been held in the face of increasing creatinine.  Congestive heart failure: Neck vein distention, peripheral edema and elevated BNP level all suggest a degree of congestive heart failure.  CXR obtained 6/11 also indicative of congestive heart failure.  Anemia: Now severe;  minimal until 04/2013/prolonged hospital admission to [facility].  Basic studies will be obtained.  Hypertension: No elevated blood pressures recorded for quite some time.  COPD: Active wheezing may be more related to congestive heart  failure than chronic lung disease, but consideration should be given to intensification of therapy, perhaps with the addition of Atrovent +/- Singulair.  [place] Bing, MD 06/15/2013, 10:17 AM

## 2013-06-15 NOTE — Progress Notes (Signed)
Physical Therapy Treatment Patient Details Name: [name] [name] MRN: [id number] DOB: January 16, 1936 Today's Date: 06/15/2013 Time: 4098-1191 PT Time Calculation (min): 54 min  PT Assessment / Plan / Recommendation Comments on Treatment Session  Pt is maintaining the level of function from last week, but we have been unable to improve upon this.  He should, however, have good enough mobility to be able to return home at the time of d/c.    Follow Up Recommendations        Does the patient have the potential to tolerate intense rehabilitation     Barriers to Discharge        Equipment Recommendations       Recommendations for Other Services    Frequency     Plan Discharge plan remains appropriate;Frequency remains appropriate    Precautions / Restrictions     Pertinent Vitals/Pain     Mobility  Bed Mobility Details for Bed Mobility Assistance: he continues to be independent with bed mobility Transfers Details for Transfer Assistance: continues with mod independence with transfers Ambulation/Gait Ambulation/Gait Assistance: 5: Supervision Ambulation Distance (Feet): 40 Feet Assistive device: Rolling walker Ambulation/Gait Assistance Details: pt now on 3L O2...O2 sat decreased to 72% after gait (had been 93%)...because of this, we are unable to improve his endurance Gait Pattern: Within Functional Limits;Trunk flexed General Gait Details: not yet ready to try steps Stairs: No Wheelchair Mobility Wheelchair Mobility: No    Exercises General Exercises - Lower Extremity Quad Sets: AROM;Both;10 reps;Supine Gluteal Sets: AROM;Both;10 reps;Supine Short Arc Quad: AROM;Both;20 reps;Supine (with 1# weight) Heel Slides: AROM;10 reps;Supine;Both Hip ABduction/ADduction: AROM;Both;10 reps;Supine (with yellow therabanc)   PT Diagnosis:    PT Problem List:   PT Treatment Interventions:     PT Goals Acute Rehab PT Goals Time For Goal Achievement: 06/29/13 Potential to Achieve  Goals: Fair Pt will Ambulate: with supervision;with rolling walker;51 - 150 feet Pt will Go Up / Down Stairs: 1-2 stairs;with rail(s);with mod assist PT Goal: Up/Down Stairs - Progress: Revised due to lack of progress PT Goal: Perform Home Exercise Program - Progress: Progressing toward goal  Visit Information  Last PT Received On: 06/15/13    Subjective Data  Subjective: I haven't been out of bed today...haven't felt as good   Cognition       Balance     End of Session PT - End of Session Equipment Utilized During Treatment: Gait belt;Oxygen Activity Tolerance: Patient tolerated treatment well Patient left: in chair;with call bell/phone within reach   GP     Myrlene Broker L 06/15/2013, 3:15 PM

## 2013-06-15 NOTE — Progress Notes (Signed)
Subjective: He feels better. He still gets hypoxic when he walks and he gets tachycardic when he walks. He has some swelling of his feet  Objective: Vital signs in last 24 hours: Temp:  [97.4 F (36.3 C)-98.1 F (36.7 C)] 97.5 F (36.4 C) (06/16 0459) Pulse Rate:  [74-102] 74 (06/16 0459) Resp:  [20] 20 (06/16 0459) BP: (113-140)/(51-68) 140/62 mmHg (06/16 0459) SpO2:  [93 %-97 %] 96 % (06/16 0729) Weight:  [77.928 kg (171 lb 12.8 oz)] 77.928 kg (171 lb 12.8 oz) (06/16 0459) Weight change: 0.363 kg (12.8 oz) Last BM Date: 06/13/13  Intake/Output from previous day: 06/15 0701 - 06/16 0700 In: 1490 [P.O.:1120; I.V.:120; IV Piggyback:250] Out: 900 [Urine:900]  PHYSICAL EXAM General appearance: alert, cooperative and mild distress Resp: rhonchi bilaterally Cardio: irregularly irregular rhythm GI: soft, non-tender; bowel sounds normal; no masses,  no organomegaly Extremities: extremities normal, atraumatic, no cyanosis or edema  Lab Results:    Basic Metabolic Panel:  Recent Labs  78/29/56 0519 06/14/13 0518 06/15/13 0457  NA 136  --  132*  K 4.1  --  4.7  CL 94*  --  92*  CO2 35*  --  34*  GLUCOSE 225*  --  264*  BUN 61*  --  77*  CREATININE 1.47* 1.75* 1.81*  CALCIUM 8.9  --  9.1   Liver Function Tests: No results found for this basename: AST, ALT, ALKPHOS, BILITOT, PROT, ALBUMIN,  in the last 72 hours No results found for this basename: LIPASE, AMYLASE,  in the last 72 hours No results found for this basename: AMMONIA,  in the last 72 hours CBC:  Recent Labs  06/13/13 0519  WBC 5.0  NEUTROABS 4.3  HGB 7.6*  HCT 24.1*  MCV 91.6  PLT 176   Cardiac Enzymes: No results found for this basename: CKTOTAL, CKMB, CKMBINDEX, TROPONINI,  in the last 72 hours BNP: No results found for this basename: PROBNP,  in the last 72 hours D-Dimer: No results found for this basename: DDIMER,  in the last 72 hours CBG:  Recent Labs  06/13/13 2150 06/14/13 0737  06/14/13 1114 06/14/13 1608 06/14/13 2054 06/15/13 0706  GLUCAP 292* 264* 448* 352* 334* 247*   Hemoglobin A1C: No results found for this basename: HGBA1C,  in the last 72 hours Fasting Lipid Panel: No results found for this basename: CHOL, HDL, LDLCALC, TRIG, CHOLHDL, LDLDIRECT,  in the last 72 hours Thyroid Function Tests: No results found for this basename: TSH, T4TOTAL, FREET4, T3FREE, THYROIDAB,  in the last 72 hours Anemia Panel: No results found for this basename: VITAMINB12, FOLATE, FERRITIN, TIBC, IRON, RETICCTPCT,  in the last 72 hours Coagulation: No results found for this basename: LABPROT, INR,  in the last 72 hours Urine Drug Screen: Drugs of Abuse  No results found for this basename: labopia, cocainscrnur, labbenz, amphetmu, thcu, labbarb    Alcohol Level: No results found for this basename: ETH,  in the last 72 hours Urinalysis: No results found for this basename: COLORURINE, APPERANCEUR, LABSPEC, PHURINE, GLUCOSEU, HGBUR, BILIRUBINUR, KETONESUR, PROTEINUR, UROBILINOGEN, NITRITE, LEUKOCYTESUR,  in the last 72 hours Misc. Labs:  ABGS No results found for this basename: PHART, PCO2, PO2ART, TCO2, HCO3,  in the last 72 hours CULTURES Recent Results (from the past 240 hour(s))  CULTURE, BLOOD (ROUTINE X 2)     Status: None   Collection Time    06/07/13  2:55 PM      Result Value Range Status   Specimen Description BLOOD  LEFT ARM   Final   Special Requests BOTTLES DRAWN AEROBIC ONLY 6CC   Final   Culture NO GROWTH 7 DAYS   Final   Report Status 06/14/2013 FINAL   Final  CULTURE, BLOOD (ROUTINE X 2)     Status: None   Collection Time    06/07/13  3:20 PM      Result Value Range Status   Specimen Description BLOOD LEFT ARM   Final   Special Requests BOTTLES DRAWN AEROBIC AND ANAEROBIC 6CC   Final   Culture NO GROWTH 7 DAYS   Final   Report Status 06/14/2013 FINAL   Final  MRSA PCR SCREENING     Status: None   Collection Time    06/07/13  8:01 PM      Result  Value Range Status   MRSA by PCR NEGATIVE  NEGATIVE Final   Comment:            The GeneXpert MRSA Assay (FDA     approved for NASAL specimens     only), is one component of a     comprehensive MRSA colonization     surveillance program. It is not     intended to diagnose MRSA     infection nor to guide or     monitor treatment for     MRSA infections.   Studies/Results: No results found.  Medications:  Prior to Admission:  Prescriptions prior to admission  Medication Sig Dispense Refill  . aspirin 81 MG chewable tablet Chew 81 mg by mouth daily.      Marland Kitchen diltiazem (CARDIZEM CD) 300 MG 24 hr capsule Take 300 mg by mouth daily.      . feeding supplement (PRO-STAT SUGAR FREE 64) LIQD Take 30 mLs by mouth at bedtime.      . fluticasone (FLOVENT HFA) 110 MCG/ACT inhaler Inhale 1 puff into the lungs 2 (two) times daily.      . furosemide (LASIX) 80 MG tablet Take 80 mg by mouth daily.      Marland Kitchen glyBURIDE (DIABETA) 5 MG tablet Take 1 tablet (5 mg total) by mouth 2 (two) times daily with a meal.      . insulin aspart (NOVOLOG) 100 UNIT/ML injection Inject 3-15 Units into the skin 3 (three) times daily with meals. Sliding scale as follows: 121-150=3 units 151-200=4 units 201-250=7 units 251-300=9 units 301-350=12 units 351-400=15 units      . insulin glargine (LANTUS) 100 UNIT/ML injection Inject 0.1 mLs (10 Units total) into the skin daily.  10 mL  12  . ipratropium-albuterol (DUONEB) 0.5-2.5 (3) MG/3ML SOLN Take 3 mLs by nebulization 4 (four) times daily.      . magnesium hydroxide (MILK OF MAGNESIA) 400 MG/5ML suspension Take 30 mLs by mouth daily as needed for constipation.      . polyethylene glycol (MIRALAX / GLYCOLAX) packet Take 17 g by mouth daily.      . potassium chloride SA (K-DUR,KLOR-CON) 20 MEQ tablet Take 20 mEq by mouth daily.      Marland Kitchen acetaminophen (TYLENOL) 500 MG tablet Take 1,000 mg by mouth every 6 (six) hours as needed. Pain      . sodium chloride (OCEAN) 0.65 % SOLN  nasal spray Place 1 spray into the nose as needed for congestion.    0   Scheduled: . antiseptic oral rinse  15 mL Mouth Rinse BID  . aspirin  81 mg Oral Daily  . ceFEPime (MAXIPIME) IV  2 g Intravenous  Q24H  . digoxin  0.25 mg Oral Once  . diltiazem  360 mg Oral Daily  . enoxaparin (LOVENOX) injection  40 mg Subcutaneous Q24H  . feeding supplement  30 mL Oral QHS  . fluticasone  1 puff Inhalation BID  . guaiFENesin  1,200 mg Oral BID  . insulin aspart  0-15 Units Subcutaneous TID WC  . insulin aspart  0-5 Units Subcutaneous QHS  . insulin glargine  15 Units Subcutaneous Q24H  . levalbuterol  0.63 mg Nebulization QID  . ondansetron (ZOFRAN) IV  4 mg Intravenous q12n4p  . polyethylene glycol  17 g Oral Daily  . predniSONE  40 mg Oral Q breakfast  . sodium chloride  10-40 mL Intracatheter Q12H  . sodium chloride  3 mL Intravenous Q12H  . vancomycin  1,000 mg Intravenous Q24H   Continuous:  [id number] chloride, acetaminophen, levalbuterol, sodium chloride, sodium chloride  Assesment: He was admitted with healthcare associated pneumonia. He has had atrial flutter with rapid ventricular response. He had acute on chronic respiratory failure and all this was complicated by acute on chronic diastolic congestive heart failure. He is anemic and that looks stable. This is probably related to his medical illness. He has some  renal dysfunction which I think is related to his diuresis. He has diabetes which is better controlled Principal Problem:   HCAP (healthcare-associated pneumonia) Active Problems:   Atrial flutter   COPD (chronic obstructive pulmonary disease)   Hypertension   Type 2 diabetes mellitus, uncontrolled   Acute-on-chronic respiratory failure   Acute on chronic diastolic CHF (congestive heart failure), NYHA class 1    Plan: He is approaching discharge. I'm going to see if we can teach him how to give himself insulin and his wife says that they have a family member who may  be able to do that also. I will add digoxin to see if it will help control his heart rate. He will need maximum home health services. I think he will be ready for discharge tomorrow    LOS: 8 days   [name] [name] L 06/15/2013, 8:41 AM

## 2013-06-15 NOTE — Progress Notes (Signed)
Discussed with the patient/family use of the sliding scale insulin.  I showed them an example of the sliding scale and explained how to use it.  The wife and children were able to tell me with examples of CBG how much insulin should be given.  I also demonstrated how to read insulin on a insulin syringe and how to appropriately pull up the medication.  They returned demonstration.  The daughter was able to administer the patient his insulin, but his wife was not ready to actually administer the insulin.

## 2013-06-16 ENCOUNTER — Inpatient Hospital Stay (HOSPITAL_COMMUNITY): Payer: Medicare Other

## 2013-06-16 DIAGNOSIS — R195 Other fecal abnormalities: Secondary | ICD-10-CM

## 2013-06-16 DIAGNOSIS — I10 Essential (primary) hypertension: Secondary | ICD-10-CM

## 2013-06-16 DIAGNOSIS — D649 Anemia, unspecified: Secondary | ICD-10-CM

## 2013-06-16 LAB — CBC WITH DIFFERENTIAL/PLATELET
Basophils Absolute: 0.1 10*3/uL (ref 0.0–0.1)
Eosinophils Relative: 0 % (ref 0–5)
HCT: 26.4 % — ABNORMAL LOW (ref 39.0–52.0)
Hemoglobin: 8 g/dL — ABNORMAL LOW (ref 13.0–17.0)
Lymphocytes Relative: 5 % — ABNORMAL LOW (ref 12–46)
MCV: 93.3 fL (ref 78.0–100.0)
Monocytes Absolute: 2.8 10*3/uL — ABNORMAL HIGH (ref 0.1–1.0)
Monocytes Relative: 27 % — ABNORMAL HIGH (ref 3–12)
Neutro Abs: 6.9 10*3/uL (ref 1.7–7.7)
RDW: 18.5 % — ABNORMAL HIGH (ref 11.5–15.5)
WBC: 10.3 10*3/uL (ref 4.0–10.5)

## 2013-06-16 LAB — BASIC METABOLIC PANEL
BUN: 82 mg/dL — ABNORMAL HIGH (ref 6–23)
CO2: 36 mEq/L — ABNORMAL HIGH (ref 19–32)
Chloride: 94 mEq/L — ABNORMAL LOW (ref 96–112)
Creatinine, Ser: 1.76 mg/dL — ABNORMAL HIGH (ref 0.50–1.35)
GFR calc Af Amer: 42 mL/min — ABNORMAL LOW (ref >90)
Potassium: 5.1 mEq/L (ref 3.5–5.1)

## 2013-06-16 LAB — GLUCOSE, CAPILLARY
Glucose-Capillary: 197 mg/dL — ABNORMAL HIGH (ref 70–99)
Glucose-Capillary: 352 mg/dL — ABNORMAL HIGH (ref 70–99)
Glucose-Capillary: 345 mg/dL — ABNORMAL HIGH (ref 70–99)

## 2013-06-16 MED ORDER — FERROUS FUMARATE 325 (106 FE) MG PO TABS
1.0000 | ORAL_TABLET | Freq: Two times a day (BID) | ORAL | Status: DC
  Administered 2013-06-16 – 2013-06-18 (×5): 106 mg via ORAL
  Filled 2013-06-16 (×5): qty 1

## 2013-06-16 MED ORDER — FUROSEMIDE 10 MG/ML IJ SOLN
40.0000 mg | Freq: Every day | INTRAMUSCULAR | Status: DC
  Administered 2013-06-16 – 2013-06-22 (×7): 40 mg via INTRAVENOUS
  Filled 2013-06-16 (×9): qty 4

## 2013-06-16 MED ORDER — METHYLPREDNISOLONE SODIUM SUCC 40 MG IJ SOLR
40.0000 mg | Freq: Two times a day (BID) | INTRAMUSCULAR | Status: DC
  Administered 2013-06-16 – 2013-06-17 (×4): 40 mg via INTRAVENOUS
  Filled 2013-06-16 (×4): qty 1

## 2013-06-16 NOTE — Plan of Care (Signed)
Problem: Phase III Progression Outcomes Goal: O2 sats > or equal to 93% on room air Outcome: Adequate for Discharge Pt on chronic O2 at home

## 2013-06-16 NOTE — Progress Notes (Signed)
Subjective: When I came to see him yesterday evening he was much more short of breath. He was having increasing congestion and was hypoxic.. he says he feels better this morning  Objective: Vital signs in last 24 hours: Temp:  [97.4 F (36.3 C)-97.9 F (36.6 C)] 97.9 F (36.6 C) (06/17 0443) Pulse Rate:  [72-92] 73 (06/17 0443) Resp:  [18-20] 18 (06/17 0443) BP: (129-138)/(58-73) 138/73 mmHg (06/17 0443) SpO2:  [91 %-96 %] 95 % (06/17 0708) Weight:  [78.3 kg (172 lb 9.9 oz)] 78.3 kg (172 lb 9.9 oz) (06/17 0443) Weight change: 0.372 kg (13.1 oz) Last BM Date: 06/15/13  Intake/Output from previous day: 06/16 0701 - 06/17 0700 In: 910 [P.O.:600; I.V.:260; IV Piggyback:50] Out: 1500 [Urine:1500]  PHYSICAL EXAM General appearance: alert, cooperative and mild distress Resp: rhonchi bilaterally Cardio: regular rate and rhythm GI: soft, non-tender; bowel sounds normal; no masses,  no organomegaly Extremities: 1+ edema of his pretibial area and his feet  Lab Results:    Basic Metabolic Panel:  Recent Labs  16/10/96 0457 06/16/13 0439  NA 132* 134*  K 4.7 5.1  CL 92* 94*  CO2 34* 36*  GLUCOSE 264* 232*  BUN 77* 82*  CREATININE 1.81* 1.76*  CALCIUM 9.1 8.9   Liver Function Tests: No results found for this basename: AST, ALT, ALKPHOS, BILITOT, PROT, ALBUMIN,  in the last 72 hours No results found for this basename: LIPASE, AMYLASE,  in the last 72 hours No results found for this basename: AMMONIA,  in the last 72 hours CBC:  Recent Labs  06/16/13 0439  WBC 10.3  NEUTROABS 6.9  HGB 8.0*  HCT 26.4*  MCV 93.3  PLT 147*   Cardiac Enzymes: No results found for this basename: CKTOTAL, CKMB, CKMBINDEX, TROPONINI,  in the last 72 hours BNP: No results found for this basename: PROBNP,  in the last 72 hours D-Dimer: No results found for this basename: DDIMER,  in the last 72 hours CBG:  Recent Labs  06/14/13 2054 06/15/13 0706 06/15/13 1142 06/15/13 1645  06/15/13 2039 06/16/13 0706  GLUCAP 334* 247* 324* 252* 304* 197*   Hemoglobin A1C: No results found for this basename: HGBA1C,  in the last 72 hours Fasting Lipid Panel: No results found for this basename: CHOL, HDL, LDLCALC, TRIG, CHOLHDL, LDLDIRECT,  in the last 72 hours Thyroid Function Tests: No results found for this basename: TSH, T4TOTAL, FREET4, T3FREE, THYROIDAB,  in the last 72 hours Anemia Panel:  Recent Labs  06/15/13 1053  VITAMINB12 405  FOLATE 7.3  FERRITIN 365*  TIBC 265  IRON 57  RETICCTPCT 7.0*   Coagulation: No results found for this basename: LABPROT, INR,  in the last 72 hours Urine Drug Screen: Drugs of Abuse  No results found for this basename: labopia, cocainscrnur, labbenz, amphetmu, thcu, labbarb    Alcohol Level: No results found for this basename: ETH,  in the last 72 hours Urinalysis: No results found for this basename: COLORURINE, APPERANCEUR, LABSPEC, PHURINE, GLUCOSEU, HGBUR, BILIRUBINUR, KETONESUR, PROTEINUR, UROBILINOGEN, NITRITE, LEUKOCYTESUR,  in the last 72 hours Misc. Labs:  ABGS No results found for this basename: PHART, PCO2, PO2ART, TCO2, HCO3,  in the last 72 hours CULTURES Recent Results (from the past 240 hour(s))  CULTURE, BLOOD (ROUTINE X 2)     Status: None   Collection Time    06/07/13  2:55 PM      Result Value Range Status   Specimen Description BLOOD LEFT ARM   Final  Special Requests BOTTLES DRAWN AEROBIC ONLY 6CC   Final   Culture NO GROWTH 7 DAYS   Final   Report Status 06/14/2013 FINAL   Final  CULTURE, BLOOD (ROUTINE X 2)     Status: None   Collection Time    06/07/13  3:20 PM      Result Value Range Status   Specimen Description BLOOD LEFT ARM   Final   Special Requests BOTTLES DRAWN AEROBIC AND ANAEROBIC 6CC   Final   Culture NO GROWTH 7 DAYS   Final   Report Status 06/14/2013 FINAL   Final  MRSA PCR SCREENING     Status: None   Collection Time    06/07/13  8:01 PM      Result Value Range Status    MRSA by PCR NEGATIVE  NEGATIVE Final   Comment:            The GeneXpert MRSA Assay (FDA     approved for NASAL specimens     only), is one component of a     comprehensive MRSA colonization     surveillance program. It is not     intended to diagnose MRSA     infection nor to guide or     monitor treatment for     MRSA infections.   Studies/Results: No results found.  Medications:  Prior to Admission:  Prescriptions prior to admission  Medication Sig Dispense Refill  . aspirin 81 MG chewable tablet Chew 81 mg by mouth daily.      Marland Kitchen diltiazem (CARDIZEM CD) 300 MG 24 hr capsule Take 300 mg by mouth daily.      . feeding supplement (PRO-STAT SUGAR FREE 64) LIQD Take 30 mLs by mouth at bedtime.      . fluticasone (FLOVENT HFA) 110 MCG/ACT inhaler Inhale 1 puff into the lungs 2 (two) times daily.      . furosemide (LASIX) 80 MG tablet Take 80 mg by mouth daily.      Marland Kitchen glyBURIDE (DIABETA) 5 MG tablet Take 1 tablet (5 mg total) by mouth 2 (two) times daily with a meal.      . insulin aspart (NOVOLOG) 100 UNIT/ML injection Inject 3-15 Units into the skin 3 (three) times daily with meals. Sliding scale as follows: 121-150=3 units 151-200=4 units 201-250=7 units 251-300=9 units 301-350=12 units 351-400=15 units      . insulin glargine (LANTUS) 100 UNIT/ML injection Inject 0.1 mLs (10 Units total) into the skin daily.  10 mL  12  . ipratropium-albuterol (DUONEB) 0.5-2.5 (3) MG/3ML SOLN Take 3 mLs by nebulization 4 (four) times daily.      . magnesium hydroxide (MILK OF MAGNESIA) 400 MG/5ML suspension Take 30 mLs by mouth daily as needed for constipation.      . polyethylene glycol (MIRALAX / GLYCOLAX) packet Take 17 g by mouth daily.      . potassium chloride SA (K-DUR,KLOR-CON) 20 MEQ tablet Take 20 mEq by mouth daily.      Marland Kitchen acetaminophen (TYLENOL) 500 MG tablet Take 1,000 mg by mouth every 6 (six) hours as needed. Pain      . sodium chloride (OCEAN) 0.65 % SOLN nasal spray Place 1  spray into the nose as needed for congestion.    0   Scheduled: . antiseptic oral rinse  15 mL Mouth Rinse BID  . aspirin  81 mg Oral Daily  . ceFEPime (MAXIPIME) IV  2 g Intravenous Q24H  . diltiazem  360 mg  Oral Daily  . enoxaparin (LOVENOX) injection  40 mg Subcutaneous Q24H  . feeding supplement  30 mL Oral QHS  . ferrous fumarate  1 tablet Oral BID  . fluticasone  1 puff Inhalation BID  . furosemide  40 mg Intravenous Daily  . guaiFENesin  1,200 mg Oral BID  . insulin aspart  0-15 Units Subcutaneous TID WC  . insulin aspart  0-5 Units Subcutaneous QHS  . insulin glargine  15 Units Subcutaneous Q24H  . levalbuterol  0.63 mg Nebulization QID  . methylPREDNISolone (SOLU-MEDROL) injection  40 mg Intravenous Q12H  . ondansetron (ZOFRAN) IV  4 mg Intravenous q12n4p  . polyethylene glycol  17 g Oral Daily  . sodium chloride  10-40 mL Intracatheter Q12H  . sodium chloride  3 mL Intravenous Q12H   Continuous:  WUJ:WJXBJY chloride, acetaminophen, levalbuterol, sodium chloride, sodium chloride  Assesment: He was admitted with healthcare associated pneumonia. He has been having trouble with atrial flutter/fibrillation. He has acute on chronic diastolic congestive heart failure and I had taken him off of Lasix because of his increasing creatinine. He has acute on chronic renal failure. His creatinine level is somewhat better today. He looks better than last night. His blood sugar is doing fairly well. He is anemic which is somewhat improved today. He had a heme positive stool. Principal Problem:   HCAP (healthcare-associated pneumonia) Active Problems:   Atrial flutter   COPD (chronic obstructive pulmonary disease)   Hypertension   Type 2 diabetes mellitus, uncontrolled   Acute-on-chronic respiratory failure   Acute on chronic diastolic CHF (congestive heart failure), NYHA class 1   Anemia, normocytic normochromic   Acute on chronic renal failure    Plan: GI consult will be obtained.  I restarted IV Lasix. Watch his creatinine level closely. I put him back on IV steroids. Cardiology consult noted and appreciated    LOS: 9 days   Yanelis Osika L 06/16/2013, 8:32 AM

## 2013-06-16 NOTE — Progress Notes (Signed)
UR Chart Review Completed  

## 2013-06-16 NOTE — Progress Notes (Signed)
Inpatient Diabetes Program Recommendations  AACE/ADA: New Consensus Statement on Inpatient Glycemic Control (2013)  Target Ranges:  Prepandial:   less than 140 mg/dL      Peak postprandial:   less than 180 mg/dL (1-2 hours)      Critically ill patients:  140 - 180 mg/dL   Reason for Assessment: Sub-optimal Glycemic Control  Inpatient Diabetes Program Recommendations Insulin - Basal: . Correction (SSI): Increase to RESISTANT during steroid therapy Insulin - Meal Coverage: consider adding Novolog 4-5 units TID with meals  Oral Agents: Oral agents are not recommended during hospitalization   Note:  Results for DARIYON, URQUILLA (MRN [id number]) as of 06/16/2013 16:27  Ref. Range 06/16/2013 07:06 06/16/2013 11:45  Glucose-Capillary Latest Range: 70-99 mg/dL 409 (H) 811 (H)   Glucose pattern confirms expected hyperglycemia related to steroid therapy and increased CHO resistance.  See above recommendations.  Thank you.  Marketia Stallsmith S. Elsie Lincoln, RN, CNS, CDE Inpatient Diabetes Program, team pager [phone]

## 2013-06-16 NOTE — Progress Notes (Signed)
Attempted to call patients spouse to let her know of patients room changing to third floor.  Number in contact list will not accept my phone call, though

## 2013-06-16 NOTE — Progress Notes (Signed)
Physical Therapy Treatment Patient Details Name: [name] [name] MRN: [id number] DOB: [birth year] Today's Date: 06/16/2013 Time: 1191-4782 PT Time Calculation (min): 38 min  PT Assessment / Plan / Recommendation Comments on Treatment Session  Pt able to tolerate ther ex with no significant decrease in O2 sat.  Major limitation today is frequent urination that was so often that he became exhausted from the process of being cleaned from that.  He had to return to bed to rest and was unable to ambulate.    Follow Up Recommendations        Does the patient have the potential to tolerate intense rehabilitation     Barriers to Discharge        Equipment Recommendations       Recommendations for Other Services    Frequency     Plan      Precautions / Restrictions     Pertinent Vitals/Pain     Mobility       Exercises General Exercises - Lower Extremity Ankle Circles/Pumps: AROM;Both;20 reps;Supine Quad Sets: AROM;Both;10 reps;Supine Gluteal Sets: AROM;Both;10 reps;Supine Short Arc Quad: AROM;Both;10 reps;Supine Heel Slides: AROM;Both;10 reps;Supine Hip ABduction/ADduction: AROM;Both;10 reps;Supine   PT Diagnosis:    PT Problem List:   PT Treatment Interventions:     PT Goals    Visit Information  Last PT Received On: 06/16/13    Subjective Data  Subjective: I guess I feel OK   Cognition       Balance     End of Session PT - End of Session Equipment Utilized During Treatment: Gait belt;Oxygen Activity Tolerance: Patient tolerated treatment well Patient left: in bed   GP     Myrlene Broker L 06/16/2013, 1:11 PM

## 2013-06-16 NOTE — Progress Notes (Signed)
Patient is currently active with long-term disease management services with [facility]. Patient will receive a post discharge transition of care call and continued monthly home visits for assessments and for education.      Raiford Noble, MSN-Ed, RN,BSN, [facility], [phone]

## 2013-06-16 NOTE — Consult Note (Addendum)
Referring Provider: Fredirick Maudlin, MD Primary Care Physician:  Fredirick Maudlin, MD Primary Gastroenterologist:  Roetta Sessions, MD   Reason for Consultation:  Heme positive stool  HPI: [name] [name] is a 77 y.o. male admitted with healthcare associated pneumonia. We were consulted for heme positive stool. Patient has documented stable anemia since 04/2013 (received two units of prbcs at that time) but prior to prolonged hospitalization in 04/2013, he had hemoglobin in the 11 range in 03/2013. Looking back through the records, he has had only mild anemia in 2012/2013. Current anemia panel unremarkable. He is on ASA. He was placed on Xarelto after hospitalization in 03/2013 (Afib/Aflutter) but this was discontinued after he presented to [facility] in 04/2013 with hemoptysis.  He had prolonged hospitalization at [facility] in 04/2013. He had hemoptysis complicated by severe COPD, respiratory failure requiring short-term ventilator support, pleural effusion, a fib and CHF. He was discharged to [facility] for rehabilitation. Presented to ED with SOB and associated tachycardia. BNP over 2000. CXR noted to have worsening multifocal interstitial and airspace disease throughout mid and lower lungs bilaterally especially left base there was enlarging moderate to large left pleural effusion.   03/2013: SBO treated conservatively.   Patient denies anorexia, vomiting, heartburn, dysphagia, constipation, diarrhea, melena, brbpr, abdominal pain. He thinks he had colonoscopy in remote past.   Prior to Admission medications   Medication Sig Start Date End Date Taking? Authorizing Provider  aspirin 81 MG chewable tablet Chew 81 mg by mouth daily.   Yes Historical Provider, MD  diltiazem (CARDIZEM CD) 300 MG 24 hr capsule Take 300 mg by mouth daily.   Yes Historical Provider, MD  feeding supplement (PRO-STAT SUGAR FREE 64) LIQD Take 30 mLs by mouth at bedtime.   Yes Historical Provider, MD  fluticasone (FLOVENT HFA)  110 MCG/ACT inhaler Inhale 1 puff into the lungs 2 (two) times daily.   Yes Historical Provider, MD  furosemide (LASIX) 80 MG tablet Take 80 mg by mouth daily.   Yes Historical Provider, MD  glyBURIDE (DIABETA) 5 MG tablet Take 1 tablet (5 mg total) by mouth 2 (two) times daily with a meal. 05/26/13  Yes Storm Frisk, MD  insulin aspart (NOVOLOG) 100 UNIT/ML injection Inject 3-15 Units into the skin 3 (three) times daily with meals. Sliding scale as follows: 121-150=3 units 151-200=4 units 201-250=7 units 251-300=9 units 301-350=12 units 351-400=15 units   Yes Historical Provider, MD  insulin glargine (LANTUS) 100 UNIT/ML injection Inject 0.1 mLs (10 Units total) into the skin daily. 05/26/13  Yes Storm Frisk, MD  ipratropium-albuterol (DUONEB) 0.5-2.5 (3) MG/3ML SOLN Take 3 mLs by nebulization 4 (four) times daily.   Yes Historical Provider, MD  magnesium hydroxide (MILK OF MAGNESIA) 400 MG/5ML suspension Take 30 mLs by mouth daily as needed for constipation.   Yes Historical Provider, MD  polyethylene glycol (MIRALAX / GLYCOLAX) packet Take 17 g by mouth daily.   Yes Historical Provider, MD  potassium chloride SA (K-DUR,KLOR-CON) 20 MEQ tablet Take 20 mEq by mouth daily.   Yes Historical Provider, MD  acetaminophen (TYLENOL) 500 MG tablet Take 1,000 mg by mouth every 6 (six) hours as needed. Pain    Historical Provider, MD  sodium chloride (OCEAN) 0.65 % SOLN nasal spray Place 1 spray into the nose as needed for congestion. 05/26/13   Storm Frisk, MD    Current Facility-Administered Medications  Medication Dose Route Frequency Provider Last Rate Last Dose  . 0.9 %  sodium  chloride infusion  250 mL Intravenous PRN Hollice Espy, MD 10 mL/hr at 06/16/13 1048 250 mL at 06/16/13 1048  . acetaminophen (TYLENOL) tablet 650 mg  650 mg Oral Q4H PRN Hollice Espy, MD   650 mg at 06/13/13 1127  . antiseptic oral rinse (BIOTENE) solution 15 mL  15 mL Mouth Rinse BID Hollice Espy, MD   15 mL at 06/16/13 0800  . aspirin chewable tablet 81 mg  81 mg Oral Daily Hollice Espy, MD   81 mg at 06/16/13 1050  . ceFEPIme (MAXIPIME) 2 g in dextrose 5 % 50 mL IVPB  2 g Intravenous Q24H Fredirick Maudlin, MD   2 g at 06/15/13 2230  . diltiazem (CARDIZEM CD) 24 hr capsule 360 mg  360 mg Oral Daily Fredirick Maudlin, MD   360 mg at 06/16/13 1050  . enoxaparin (LOVENOX) injection 40 mg  40 mg Subcutaneous Q24H Hollice Espy, MD   40 mg at 06/15/13 1725  . feeding supplement (PRO-STAT SUGAR FREE 64) liquid 30 mL  30 mL Oral QHS Hollice Espy, MD   30 mL at 06/11/13 2151  . ferrous fumarate (HEMOCYTE - 106 mg FE) tablet 106 mg of iron  1 tablet Oral BID Fredirick Maudlin, MD   106 mg of iron at 06/16/13 1050  . fluticasone (FLOVENT HFA) 110 MCG/ACT inhaler 1 puff  1 puff Inhalation BID Hollice Espy, MD   1 puff at 06/16/13 0706  . furosemide (LASIX) injection 40 mg  40 mg Intravenous Daily Fredirick Maudlin, MD   40 mg at 06/16/13 1049  . guaiFENesin (MUCINEX) 12 hr tablet 1,200 mg  1,200 mg Oral BID Fredirick Maudlin, MD   1,200 mg at 06/16/13 1050  . insulin aspart (novoLOG) injection 0-15 Units  0-15 Units Subcutaneous TID WC Fredirick Maudlin, MD   3 Units at 06/16/13 0801  . insulin aspart (novoLOG) injection 0-5 Units  0-5 Units Subcutaneous QHS Fredirick Maudlin, MD   4 Units at 06/15/13 2230  . insulin glargine (LANTUS) injection 15 Units  15 Units Subcutaneous Q24H Fredirick Maudlin, MD   15 Units at 06/16/13 1049  . levalbuterol (XOPENEX) nebulizer solution 0.63 mg  0.63 mg Nebulization Q4H PRN Hollice Espy, MD   0.63 mg at 06/15/13 2254  . levalbuterol (XOPENEX) nebulizer solution 0.63 mg  0.63 mg Nebulization QID Fredirick Maudlin, MD   0.63 mg at 06/16/13 1126  . methylPREDNISolone sodium succinate (SOLU-MEDROL) 40 mg/mL injection 40 mg  40 mg Intravenous Q12H Fredirick Maudlin, MD   40 mg at 06/16/13 1053  . ondansetron (ZOFRAN) injection 4 mg  4 mg  Intravenous q12n4p Avon Gully, MD   4 mg at 06/16/13 1049  . polyethylene glycol (MIRALAX / GLYCOLAX) packet 17 g  17 g Oral Daily Hollice Espy, MD   17 g at 06/15/13 1211  . sodium chloride 0.9 % injection 10-40 mL  10-40 mL Intracatheter Q12H Fredirick Maudlin, MD   10 mL at 06/15/13 2231  . sodium chloride 0.9 % injection 10-40 mL  10-40 mL Intracatheter PRN Fredirick Maudlin, MD   10 mL at 06/16/13 1050  . sodium chloride 0.9 % injection 3 mL  3 mL Intravenous Q12H Hollice Espy, MD   3 mL at 06/11/13 2151  . sodium chloride 0.9 % injection 3 mL  3 mL Intravenous PRN Hollice Espy, MD  Facility-Administered Medications Ordered in Other Encounters  Medication Dose Route Frequency Provider Last Rate Last Dose  . neomycin-polymyxin-dexameth (MAXITROL) 0.1 % ophth ointment    PRN Gemma Payor, MD   1 application at 10/27/12 0981    Allergies as of 06/07/2013  . (No Known Allergies)    Past Medical History  Diagnosis Date  . COPD (chronic obstructive pulmonary disease)   . AF (atrial fibrillation)   . Hypertension   . Gout   . Arthritis   . Small bowel obstruction   . Renal insufficiency   . Aortic stenosis   . Hydropneumothorax   . Diabetes mellitus   . CHF (congestive heart failure)   . On home O2     qhs prn  . Pneumonia 05/04/2013    history of pneumonia  . Pneumonia 10/12  . Hydropneumothorax 2012    Past Surgical History  Procedure Laterality Date  . Rotator cuff repair      left and right  . Cardiac valve replacement      aortic valve with a tissue prosthesis and left sided maze proc  . S/p rewiring of sternum for dehiscence    . Gallbladder surgery    . US echocardiography  01-03-11    EF 55-60%  . Cardiovascular stress test  01-26-09    EF 67%  . Coronary angioplasty    . Cholecystectomy    . Sternal incision reclosure  08/29/2010    sternal rewiring  . Cataract extraction w/phaco  10/02/2012    Procedure: CATARACT EXTRACTION PHACO AND  INTRAOCULAR LENS PLACEMENT (IOC);  Surgeon: Gemma Payor, MD;  Location: [facility];  Service: Ophthalmology;  Laterality: Right;  CDE 16.68  . Cataract extraction w/phaco  10/27/2012    Procedure: CATARACT EXTRACTION PHACO AND INTRAOCULAR LENS PLACEMENT (IOC);  Surgeon: Gemma Payor, MD;  Location: [facility];  Service: Ophthalmology;  Laterality: Left;  CDE:16.89  . Artificial aortic valve      Family History  Problem Relation Age of Onset  . Hypertension Mother   . Stroke Mother   . Heart disease Sister   . Kidney disease Sister     History   Social History  . Marital Status: Married    Spouse Name: N/A    Number of Children: N/A  . Years of Education: N/A   Occupational History  . Not on file.   Social History Main Topics  . Smoking status: Former Smoker    Types: Cigarettes    Quit date: 01/18/2005  . Smokeless tobacco: Never Used  . Alcohol Use: No  . Drug Use: No  . Sexually Active: Yes    Birth Control/ Protection: None   Other Topics Concern  . Not on file   Social History Narrative   [place]ives[place] in [place]   As a younger man at [employer]     ROS:  General: Negative for anorexia, weight loss, fever, chills. + fatigue, weakness. Eyes: Negative for vision changes.  ENT: Negative for hoarseness, difficulty swallowing , nasal congestion. CV: Negative for chest pain, angina, palpitations. +dyspnea on exertion, peripheral edema.  Respiratory: Positive for dyspnea at rest, dyspnea on exertion, cough, sputum, wheezing.  GI: See history of present illness. GU:  Negative for dysuria, hematuria, urinary incontinence, urinary frequency, nocturnal urination.  MS: Negative for joint pain, low back pain.  Derm: Negative for rash or itching.  Neuro: Negative for weakness, abnormal sensation, seizure, frequent headaches, memory loss, confusion.  Psych: Negative for anxiety, depression, suicidal  ideation, hallucinations.  Endo: Negative for unusual weight change.  Heme:  Negative for bruising or bleeding. Allergy: Negative for rash or hives.       Physical Examination: Vital signs in last 24 hours: Temp:  [97.4 F (36.3 C)-97.9 F (36.6 C)] 97.9 F (36.6 C) (06/17 0443) Pulse Rate:  [72-92] 73 (06/17 0443) Resp:  [18-20] 18 (06/17 0443) BP: (129-138)/(58-73) 138/73 mmHg (06/17 0443) SpO2:  [91 %-96 %] 95 % (06/17 0708) Weight:  [172 lb 9.9 oz (78.3 kg)] 172 lb 9.9 oz (78.3 kg) (06/17 0443) Last BM Date: 06/15/13  General: chronically ill-appearing WM, visibly SOB with very little activity (standing up from bed). No acute distress.  Head: Normocephalic, atraumatic.   Eyes: Conjunctiva pale, no icterus. Mouth: Oropharyngeal mucosa moist and pink , no lesions erythema or exudate. Neck: Supple without thyromegaly, masses, or lymphadenopathy.  Lungs: diminished breath sounds more of left Heart: tachycardia, irregular rhythm, + murmurs.  Abdomen: Bowel sounds are normal, nontender, nondistended, no hepatosplenomegaly or masses, no abdominal bruits or    hernia , no rebound or guarding.  Examined sitting up as patient was sitting forward to breath more comfortably. Will examine abdomen in lying position later today. Rectal: not performed Extremities: 1-2+ lower extremity edema. No clubbing, deformity.  Neuro: Alert and oriented x 4 , grossly normal neurologically.  Skin: Warm and dry, no rash or jaundice.   Psych: Alert and cooperative, normal mood and affect.        Intake/Output from previous day: 06/16 0701 - 06/17 0700 In: 910 [P.O.:600; I.V.:260; IV Piggyback:50] Out: 1500 [Urine:1500] Intake/Output this shift: Total I/O In: 120 [P.O.:120] Out: 625 [Urine:625]  Lab Results: CBC  Recent Labs  06/16/13 0439  WBC 10.3  HGB 8.0*  HCT 26.4*  MCV 93.3  PLT 147*   Lab Results  Component Value Date   IRON 57 06/15/2013   TIBC 265 06/15/2013   FERRITIN 365* 06/15/2013   Lab Results  Component Value Date   FOLATE 7.3 06/15/2013   Lab  Results  Component Value Date   VITAMINB12 405 06/15/2013    BMET  Recent Labs  06/14/13 0518 06/15/13 0457 06/16/13 0439  NA  --  132* 134*  K  --  4.7 5.1  CL  --  92* 94*  CO2  --  34* 36*  GLUCOSE  --  264* 232*  BUN  --  77* 82*  CREATININE 1.75* 1.81* 1.76*  CALCIUM  --  9.1 8.9   Lab Results  Component Value Date   ALT 13 06/07/2013   AST 15 06/07/2013   ALKPHOS 95 06/07/2013   BILITOT 1.2 06/07/2013   Lab Results  Component Value Date   INR 1.31 05/16/2013   INR 2.3 03/23/2011   INR 1.59* 09/28/2010    Imaging Studies:    Dg Chest Port 1v Same Day  06/10/2013   *RADIOLOGY REPORT*  Clinical Data: PICC line placement  PORTABLE CHEST - 1 VIEW SAME DAY  Comparison: 06/10/2013 at 10:23 a.m.  Findings: 1159 hours.  PICC line has been repositioned.  Its tip is difficult to visualize, but likely terminates over the mid SVC. No pneumothorax.  Prior median sternotomy. Cardiomegaly accentuated by [facility] portable technique.  Moderate left-sided pleural effusion.  Moderate interstitial edema.  Left greater than right lower lobe predominant airspace disease.  IMPRESSION: Right-sided PICC line terminating at mid SVC.  Otherwise, similar appearance of congestive heart failure with left pleural effusion and lower lobe predominant airspace  disease.   Original Report Authenticated By: Jeronimo Greaves, M.D.  Pierre.[place] week]   Impression: 77 y/o male with complicated history including two hospitalizations since 03/2013 for Afib/Aflutter initially and then for respiratory failure as outlined above. He was on Xarelto after 03/2013 but anticoagulation was discontinued after prolonged hospitalization in 04/2013. Now he is back on Lovenox. Cardiology as recommended anticoagulation if feasible given substantial risk for thromboembolism in setting of chronic a.fib/flutter. Heme positive X 1 this admission. No overt GI bleeding. He was transfused back in 04/2013. H/H as been stable this admission. He is otherwise void of any  GI symptoms.   Plan: 1. Would consider colonoscopy +/- EGD for anemia/heme positive stool once patient is able to undergo evaluation. Currently his respiratory status is not appropriate for conscious sedation. 2. Consider empiric PPI.  3. Will continue to follow with you.  I would like to thank Dr. [name] for allowing [place] to take part in the care of this nice patient.     LOS: 9 days   Tana Coast  06/16/2013, 11:40 AM  Attending note:  Agree with above Impression and plan; Pt not fit for any endoscopic evaluation at this time.  Will continue to re-asess.

## 2013-06-16 NOTE — Progress Notes (Signed)
  [name] [name]  77 y.o.  male  Subjective: Continued dyspnea and weakness; requiring a face mask to maintain adequate oxygenation.  Allergy: Review of patient's allergies indicates no known allergies.  Objective: Vital signs in last 24 hours: Temp:  [97.4 F (36.3 C)-97.9 F (36.6 C)] 97.9 F (36.6 C) (06/17 0443) Pulse Rate:  [72-92] 73 (06/17 0443) Resp:  [18-20] 18 (06/17 0443) BP: (129-138)/(58-73) 138/73 mmHg (06/17 0443) SpO2:  [91 %-96 %] 91 % (06/17 1126) Weight:  [78.3 kg (172 lb 9.9 oz)] 78.3 kg (172 lb 9.9 oz) (06/17 0443)  78.3 kg (172 lb 9.9 oz) Body mass index is 27.87 kg/(m^2).  Weight change: 0.372 kg (13.1 oz) Last BM Date: 06/15/13  Intake/Output from previous day: 06/16 0701 - 06/17 0700 In: 910 [P.O.:600; I.V.:260; IV Piggyback:50] Out: 1500 [Urine:1500] Total I/O since admission:  -1.9 L; -1.2 L over the past 24 hours  General- Well developed; no acute distress  Neck- mild JVD, no carotid bruits Lungs- decreased breath sounds, particularly at the left base; prolonged expiratory phase with expiratory wheezing Cardiovascular- normal PMI; normal S1 and S2; regular rhythm; grade 2/6 basilar systolic ejection murmur Abdomen- normal bowel sounds; abdomen firm and non-tender without masses or organomegaly Skin- Warm, no significant lesions Extremities- Nl distal pulses; 1/2+ edema    Recent Labs  06/16/13 0439  WBC 10.3  HGB 8.0*  HCT 26.4*  PLT 147*   BMET:  Recent Labs  06/15/13 0457 06/16/13 0439  NA 132* 134*  K 4.7 5.1  CL 92* 94*  CO2 34* 36*  GLUCOSE 264* 232*  BUN 77* 82*  CREATININE 1.81* 1.76*  CALCIUM 9.1 8.9   Telemetry: Atrial flutter with variable AV conduction; ventricular rate increased to 150 bpm transiently earlier today; subsequently has been consistently 75 bpm. Rhythm was clearly atrial flutter until 2 PM at which time there was a change and lead configuration without such apparent flutter waves, but it appears likely that  flutter has persisted.  Medications:  I have reviewed the patient's current medications. Scheduled: . aspirin  81 mg Oral Daily  . ceFEPime (MAXIPIME) IV  2 g Intravenous Q24H  . diltiazem  360 mg Oral Daily  . enoxaparin (LOVENOX) injection  40 mg Subcutaneous Q24H  . fluticasone  1 puff Inhalation BID  . furosemide  40 mg Intravenous Daily  . guaiFENesin  1,200 mg Oral BID  . insulin glargine  15 Units Subcutaneous Q24H  . levalbuterol  0.63 mg Nebulization QID  . methylPREDNISolone (SOLU-MEDROL) injection  40 mg Intravenous Q12H  Assessment/Plan: Atrial flutter:  Adequate control of ventricular rate for the most part. No need to increase AV nodal blocking agents.  CHF: Modest diuresis without improvement in symptoms, but also without deterioration in renal function. Current dose of furosemide will be continued.  Hypertension: Blood pressure has been normal since admission.  COPD exacerbation: Continues to be his main problem. Responds to appropriate therapy has been limited.  LOS: 9 days   [name] [name] 06/16/2013, 12:06 PM

## 2013-06-16 NOTE — Progress Notes (Signed)
Pt was moved to 3A and RT checked his O2 SATS and SATS were 81%, increased O2 to 5l [place] and SATS went up to 87%. Checked the Pt's SAT again at 1450 and his SATS were 84% to 86%. RT placed Pt on 50% VM and asked nurse to call Dr. [name]. RT suggested that the pt get another XRAT and the Pt may need to be placed in the ICU with a BIPAP.

## 2013-06-17 ENCOUNTER — Inpatient Hospital Stay (HOSPITAL_COMMUNITY): Payer: Medicare Other

## 2013-06-17 DIAGNOSIS — I5032 Chronic diastolic (congestive) heart failure: Secondary | ICD-10-CM

## 2013-06-17 LAB — GLUCOSE, SEROUS FLUID

## 2013-06-17 LAB — BASIC METABOLIC PANEL
BUN: 80 mg/dL — ABNORMAL HIGH (ref 6–23)
Creatinine, Ser: 1.56 mg/dL — ABNORMAL HIGH (ref 0.50–1.35)
GFR calc Af Amer: 48 mL/min — ABNORMAL LOW (ref >90)
GFR calc non Af Amer: 42 mL/min — ABNORMAL LOW (ref >90)
Glucose, Bld: 366 mg/dL — ABNORMAL HIGH (ref 70–99)

## 2013-06-17 LAB — GLUCOSE, CAPILLARY
Glucose-Capillary: 339 mg/dL — ABNORMAL HIGH (ref 70–99)
Glucose-Capillary: 357 mg/dL — ABNORMAL HIGH (ref 70–99)
Glucose-Capillary: 300 mg/dL — ABNORMAL HIGH (ref 70–99)

## 2013-06-17 LAB — PH, BODY FLUID: pH, Fluid: 8.5

## 2013-06-17 LAB — PROTEIN, BODY FLUID: Total protein, fluid: 3.7 g/dL

## 2013-06-17 LAB — BODY FLUID CELL COUNT WITH DIFFERENTIAL
Monocyte-Macrophage-Serous Fluid: 51 % (ref 50–90)
Total Nucleated Cell Count, Fluid: 346 cu mm (ref 0–1000)

## 2013-06-17 MED ORDER — INSULIN ASPART 100 UNIT/ML ~~LOC~~ SOLN
0.0000 [IU] | Freq: Every day | SUBCUTANEOUS | Status: DC
  Administered 2013-06-17: 3 [IU] via SUBCUTANEOUS
  Administered 2013-06-19 – 2013-06-20 (×2): 4 [IU] via SUBCUTANEOUS
  Administered 2013-06-21: 3 [IU] via SUBCUTANEOUS

## 2013-06-17 MED ORDER — INSULIN GLARGINE 100 UNIT/ML ~~LOC~~ SOLN
18.0000 [IU] | SUBCUTANEOUS | Status: DC
  Administered 2013-06-17 – 2013-06-22 (×5): 18 [IU] via SUBCUTANEOUS
  Filled 2013-06-17 (×7): qty 0.18

## 2013-06-17 MED ORDER — INSULIN ASPART 100 UNIT/ML ~~LOC~~ SOLN
0.0000 [IU] | Freq: Three times a day (TID) | SUBCUTANEOUS | Status: DC
  Administered 2013-06-17: 20 [IU] via SUBCUTANEOUS
  Administered 2013-06-18: 4 [IU] via SUBCUTANEOUS
  Administered 2013-06-18 (×2): 11 [IU] via SUBCUTANEOUS
  Administered 2013-06-19: 3 [IU] via SUBCUTANEOUS
  Administered 2013-06-20: 11 [IU] via SUBCUTANEOUS
  Administered 2013-06-20: 4 [IU] via SUBCUTANEOUS
  Administered 2013-06-20: 15 [IU] via SUBCUTANEOUS
  Administered 2013-06-21: 4 [IU] via SUBCUTANEOUS
  Administered 2013-06-21: 20 [IU] via SUBCUTANEOUS
  Administered 2013-06-21: 11 [IU] via SUBCUTANEOUS
  Administered 2013-06-22: 3 [IU] via SUBCUTANEOUS
  Administered 2013-06-22: 4 [IU] via SUBCUTANEOUS

## 2013-06-17 MED ORDER — INSULIN ASPART 100 UNIT/ML ~~LOC~~ SOLN
20.0000 [IU] | Freq: Once | SUBCUTANEOUS | Status: AC
  Administered 2013-06-17: 20 [IU] via SUBCUTANEOUS

## 2013-06-17 MED ORDER — SODIUM POLYSTYRENE SULFONATE 15 GM/60ML PO SUSP
30.0000 g | Freq: Once | ORAL | Status: AC
  Administered 2013-06-17: 30 g via ORAL
  Filled 2013-06-17: qty 120

## 2013-06-17 NOTE — Progress Notes (Signed)
Subjective:  Patient feels better. Eating breakfast. No abdominal pain. No melena, brbpr.   Objective: Vital signs in last 24 hours: Temp:  [97.4 F (36.3 C)-98 F (36.7 C)] 97.4 F (36.3 C) (06/18 0552) Pulse Rate:  [73-75] 75 (06/18 0552) Resp:  [20] 20 (06/18 0552) BP: (134-141)/(55-65) 136/65 mmHg (06/18 0552) SpO2:  [89 %-100 %] 98 % (06/18 0649) FiO2 (%):  [50 %] 50 % (06/18 0649) Weight:  [173 lb 3.2 oz (78.563 kg)] 173 lb 3.2 oz (78.563 kg) (06/18 0552) Last BM Date: 06/15/13 General:   Alert,  Well-developed, well-nourished, pleasant and cooperative in NAD. Looks much more comfortable right now. Eating breakfast. Wife at bedside.  Head:  Normocephalic and atraumatic. Eyes:  Sclera clear, no icterus.  Chest: Rhonchi bilaterally.    Heart:  Irregular rhythm. Abdomen:  Soft, nontender and nondistended.   Normal bowel sounds, without guarding, and without rebound.   Extremities:  1-2+ PE lower extremities. Without clubbing, deformity. Neurologic:  Alert and  oriented x4;  grossly normal neurologically. Skin:  Intact without significant lesions or rashes. Psych:  Alert and cooperative. Normal mood and affect.  Intake/Output from previous day: 06/17 0701 - 06/18 0700 In: 557.8 [P.O.:320; I.V.:187.8; IV Piggyback:50] Out: 1725 [Urine:1725] Intake/Output this shift: Total I/O In: 240 [P.O.:240] Out: -   Lab Results: CBC  Recent Labs  06/16/13 0439  WBC 10.3  HGB 8.0*  HCT 26.4*  MCV 93.3  PLT 147*   BMET  Recent Labs  06/15/13 0457 06/16/13 0439 06/17/13 0503  NA 132* 134* 135  K 4.7 5.1 5.6*  CL 92* 94* 94*  CO2 34* 36* 35*  GLUCOSE 264* 232* 366*  BUN 77* 82* 80*  CREATININE 1.81* 1.76* 1.56*  CALCIUM 9.1 8.9 8.9     Imaging Studies:  Dg Chest Port 1 View  06/16/2013   *RADIOLOGY REPORT*  Clinical Data: Hypoxia.  PORTABLE CHEST - 1 VIEW  Comparison: 06/10/2013.  Findings: Large left pleural effusion with a significant increase in size.  Increased  airspace opacity in the remaining aerated left upper lobe.  No significant change in right lung airspace opacity and prominence of the interstitial markings.  Stable median sternotomy wires and prosthetic heart valve.  Thoracic spine degenerative changes.  IMPRESSION:  1.  Significant increase in the size of a large left pleural effusion. 2.  Increased left lung atelectasis or pneumonia. 3.  Stable right lung airspace opacity suspicious for pneumonia and chronic interstitial lung disease.   Original Report Authenticated By: Beckie Salts, M.D.   Dg Chest Port 1 View  06/10/2013   *RADIOLOGY REPORT*  Clinical Data: PICC placement  PORTABLE CHEST - 1 VIEW  Comparison: 06/07/2013 and CT chest 05/21/2013.  Findings: Right PICC tip is directed superiorly in the region of the SVC, suggesting a possible azygos location.  Heart size is grossly stable.  Large left pleural effusion and bibasilar dependent air space disease persist.  No definite right pleural fluid.  Minimal biapical pleural parenchymal scarring.  IMPRESSION:  1.  Right PICC is directed superiorly in the region of the SVC, and may be within the azygos vein.  Retracting approximately 3.5 cm should better position the tip in the region of the SVC. These results will be called to the ordering clinician or representative by the Radiologist Assistant, and communication documented in the PACS Dashboard.  2.  Large left pleural effusion with bilateral air space disease, left greater than right, stable.   Original Report Authenticated By: Juliette Alcide  Fredirick Lathe, M.D.   Dg Chest Port 1 View  06/07/2013   *RADIOLOGY REPORT*  Clinical Data: Tachycardia and shortness of breath.  PORTABLE CHEST - 1 VIEW  Comparison: Chest x-ray 05/25/2013.  Findings: Emphysematous changes again noted.  Markedly worsened opacity at the base of the left hemithorax compatible with increasing atelectasis and/or consolidation and moderate to large left-sided pleural effusion.  Patchy multifocal  interstitial and airspace opacities are also noted throughout the mid to lower lungs bilaterally, and have also increased slightly compared to the prior examination.  No evidence of pulmonary edema.  Heart size is upper limits of normal. The patient is rotated to the left on today's exam, resulting in distortion of the mediastinal contours and reduced diagnostic sensitivity and specificity for mediastinal pathology.  Atherosclerosis in the thoracic aorta.  Status post median sternotomy for aortic valve replacement (a stented bioprosthesis is noted).  IMPRESSION: 1.  Worsening multifocal interstitial and airspace disease throughout the mid and lower lungs bilaterally, particularly at the left base where there is also an enlarging moderate to large left pleural effusion.  Findings are concerning for old worsening multilobar pneumonia.   Original Report Authenticated By: Trudie Reed, M.D.          Assessment: 77 y/o male with complicated history including two hospitalizations since 03/2013 for Afib/Aflutter initially and then for respiratory failure as outlined above. He was on Xarelto after 03/2013 but anticoagulation was discontinued after prolonged hospitalization in 04/2013. Now he is back on Lovenox. Cardiology as recommended anticoagulation if feasible given substantial risk for thromboembolism in setting of chronic a.fib/flutter. Heme positive X 1 this admission. No overt GI bleeding. He was transfused back in 04/2013. H/H as been stable this admission. He is otherwise void of any GI symptoms.  CXR yesterday afternoon showed significant increase in size of a large left pleural effusion and bilateral PNA.  Plan: 1. Patient is having thoracentesis today. 2. Consider colonoscopy +/- EGD for anemia/heme positive stool once patient is stable from respiratory standpoint. Patient's wife is requesting evaluation prior to his discharge rather than as outpatient, which I agree would be in best interest for  this patient.  3. Will continue to follow with you.    LOS: 10 days   Tana Coast  06/17/2013, 9:08 AM  Attending note:  As above. He looks better today. He just had nearly 1500 cc of fluid removed via thoracentesis. Breathing easier. We'll reassess tomorrow morning. We may be able move towards a colon prep later tomorrow -  for colonoscopy on the 20th

## 2013-06-17 NOTE — Progress Notes (Signed)
PT Cancellation Note  Patient Details Name: BRAYLIN XU MRN: [id number] DOB: [birth year]   Cancelled Treatment:    Reason Eval/Treat Not Completed: Other (comment) Pt down for thoracentesis recently.  Will defer PT until tomorrow.  Konrad Penta 06/17/2013, 2:25 PM

## 2013-06-17 NOTE — Progress Notes (Signed)
UR Chart Review Completed  

## 2013-06-17 NOTE — Progress Notes (Signed)
Patient ID: [name] [name], male   DOB: 06/04/1936, 77 y.o.   MRN: [id number]    Subjective:  Less dyspnic on mask  Objective:  Filed Vitals:   06/16/13 1926 06/16/13 2120 06/17/13 0552 06/17/13 0649  BP:  141/61 136/65   Pulse:  73 75   Temp:  97.7 F (36.5 C) 97.4 F (36.3 C)   TempSrc:  Oral Oral   Resp:  20 20   Height:      Weight:   173 lb 3.2 oz (78.563 kg)   SpO2: 91% 99% 100% 98%    Intake/Output from previous day:  Intake/Output Summary (Last 24 hours) at 06/17/13 0909 Last data filed at 06/17/13 0854  Gross per 24 hour  Intake 677.83 ml  Output   1525 ml  Net -847.17 ml    Physical Exam: Affect appropriate Chronically ill male with mask on HEENT: normal Neck supple with no adenopathy JVP normal no bruits no thyromegaly Lungs decreased BS both bases no  wheezing and good diaphragmatic motion Heart:  S1/S2 no murmur, no rub, gallop or click PMI normal Abdomen: benighn, BS positve, no tenderness, no AAA no bruit.  No HSM or HJR Distal pulses intact with no bruits Trace bilateral edema Neuro non-focal Skin warm and dry No muscular weakness   Lab Results: Basic Metabolic Panel:  Recent Labs  14/78/29 0439 06/17/13 0503  NA 134* 135  K 5.1 5.6*  CL 94* 94*  CO2 36* 35*  GLUCOSE 232* 366*  BUN 82* 80*  CREATININE 1.76* 1.56*  CALCIUM 8.9 8.9   CBC:  Recent Labs  06/16/13 0439  WBC 10.3  NEUTROABS 6.9  HGB 8.0*  HCT 26.4*  MCV 93.3  PLT 147*   Anemia Panel:  Recent Labs  06/15/13 1053  VITAMINB12 405  FOLATE 7.3  FERRITIN 365*  TIBC 265  IRON 57  RETICCTPCT 7.0*    Imaging: Dg Chest Port 1 View  06/16/2013   *RADIOLOGY REPORT*  Clinical Data: Hypoxia.  PORTABLE CHEST - 1 VIEW  Comparison: 06/10/2013.  Findings: Large left pleural effusion with a significant increase in size.  Increased airspace opacity in the remaining aerated left upper lobe.  No significant change in right lung airspace opacity and prominence of the  interstitial markings.  Stable median sternotomy wires and prosthetic heart valve.  Thoracic spine degenerative changes.  IMPRESSION:  1.  Significant increase in the size of a large left pleural effusion. 2.  Increased left lung atelectasis or pneumonia. 3.  Stable right lung airspace opacity suspicious for pneumonia and chronic interstitial lung disease.   Original Report Authenticated By: Beckie Salts, M.D.    Cardiac Studies:  ECG:    Telemetry:  afib flutter reasonable rate control  Echo: 4/21 EF 55-60% moderate LAE  Signs of cor pulmonale  Medications:   . antiseptic oral rinse  15 mL Mouth Rinse BID  . aspirin  81 mg Oral Daily  . ceFEPime (MAXIPIME) IV  2 g Intravenous Q24H  . diltiazem  360 mg Oral Daily  . enoxaparin (LOVENOX) injection  40 mg Subcutaneous Q24H  . feeding supplement  30 mL Oral QHS  . ferrous fumarate  1 tablet Oral BID  . fluticasone  1 puff Inhalation BID  . furosemide  40 mg Intravenous Daily  . guaiFENesin  1,200 mg Oral BID  . insulin aspart  0-20 Units Subcutaneous TID WC  . insulin aspart  0-5 Units Subcutaneous QHS  . insulin glargine  18  Units Subcutaneous Q24H  . levalbuterol  0.63 mg Nebulization QID  . methylPREDNISolone (SOLU-MEDROL) injection  40 mg Intravenous Q12H  . ondansetron (ZOFRAN) IV  4 mg Intravenous q12n4p  . polyethylene glycol  17 g Oral Daily  . sodium chloride  10-40 mL Intracatheter Q12H  . sodium chloride  3 mL Intravenous Q12H  . sodium polystyrene  30 g Oral Once       Assessment/Plan:  Afib:  Continue cardizem  Previous failure of amiodarone. No beta blockers due to lung disease. Anticoagulation Per primary after throracentesis Edema:  From cor pulmonale more than diastolic issues. Continue daily lasix Pulm;  Major issue Continue antibiotics, oxgyen and steroids Thoracentesis today  Charlton Haws 06/17/2013, 9:09 AM

## 2013-06-17 NOTE — Procedures (Signed)
PreOperative Dx: LEFT pleural effusion Postoperative Dx: LEFT pleural effusion Procedure:   US guided LEFT thoracentesis Radiologist:  Tyron Russell Anesthesia:  6 ml of 1% lidocaine Specimen:  1480 ml of orange colored fluid EBL:   < 1 ml Complications: None

## 2013-06-17 NOTE — Progress Notes (Signed)
Inpatient Diabetes Program Recommendations  AACE/ADA: New Consensus Statement on Inpatient Glycemic Control (2013)  Target Ranges:  Prepandial:   less than 140 mg/dL      Peak postprandial:   less than 180 mg/dL (1-2 hours)      Critically ill patients:  140 - 180 mg/dL   Hyperglycemia into 846'N today with high dose steroid therapy.  Inpatient Diabetes Program Recommendations Insulin - Basal: . Correction (SSI): xxxx Insulin - Meal Coverage: While on high dose of steroid therapy, please add novolog meal coverage of 4-6 units tidwc. Oral Agents: Oral agents are not recommended during hospitalization  Diet: xxx  Thank you, Lenor Coffin, RN, CNS, Diabetes Coordinator [phone])

## 2013-06-17 NOTE — Progress Notes (Signed)
Lidocaine 1%          6mL injected                        pleural fluid removed

## 2013-06-17 NOTE — Progress Notes (Signed)
ANTIBIOTIC CONSULT NOTE   Pharmacy Consult for Cefepime Indication: pneumonia  No Known Allergies  Patient Measurements: Height: 5\' 6"  (167.6 cm) Weight: 173 lb 3.2 oz (78.563 kg) IBW/kg (Calculated) : 63.8  Vital Signs: Temp: 97.4 F (36.3 C) (06/18 0552) Temp src: Oral (06/18 0552) BP: 136/65 mmHg (06/18 0552) Pulse Rate: 75 (06/18 0552) Intake/Output from previous day: 06/17 0701 - 06/18 0700 In: 557.8 [P.O.:320; I.V.:187.8; IV Piggyback:50] Out: 1725 [Urine:1725] Intake/Output from this shift: Total I/O In: 240 [P.O.:240] Out: -   Labs:  Recent Labs  06/15/13 0457 06/16/13 0439 06/17/13 0503  WBC  --  10.3  --   HGB  --  8.0*  --   PLT  --  147*  --   CREATININE 1.81* 1.76* 1.56*   Estimated Creatinine Clearance: 40.3 ml/min (by C-G formula based on Cr of 1.56). No results found for this basename: VANCOTROUGH, Leodis Binet, VANCORANDOM, GENTTROUGH, GENTPEAK, GENTRANDOM, TOBRATROUGH, TOBRAPEAK, TOBRARND, AMIKACINPEAK, AMIKACINTROU, AMIKACIN,  in the last 72 hours  Microbiology: Recent Results (from the past 720 hour(s))  BODY FLUID CULTURE     Status: None   Collection Time    05/22/13 11:38 AM      Result Value Range Status   Specimen Description PLEURAL LEFT FLUID   Final   Special Requests 7.0ML FLUID   Final   Gram Stain     Final   Value: NO WBC SEEN     NO ORGANISMS SEEN   Culture NO GROWTH 3 DAYS   Final   Report Status 05/25/2013 FINAL   Final  CULTURE, BLOOD (ROUTINE X 2)     Status: None   Collection Time    06/07/13  2:55 PM      Result Value Range Status   Specimen Description BLOOD LEFT ARM   Final   Special Requests BOTTLES DRAWN AEROBIC ONLY 6CC   Final   Culture NO GROWTH 7 DAYS   Final   Report Status 06/14/2013 FINAL   Final  CULTURE, BLOOD (ROUTINE X 2)     Status: None   Collection Time    06/07/13  3:20 PM      Result Value Range Status   Specimen Description BLOOD LEFT ARM   Final   Special Requests BOTTLES DRAWN AEROBIC AND  ANAEROBIC 6CC   Final   Culture NO GROWTH 7 DAYS   Final   Report Status 06/14/2013 FINAL   Final  MRSA PCR SCREENING     Status: None   Collection Time    06/07/13  8:01 PM      Result Value Range Status   MRSA by PCR NEGATIVE  NEGATIVE Final   Comment:            The GeneXpert MRSA Assay (FDA     approved for NASAL specimens     only), is one component of a     comprehensive MRSA colonization     surveillance program. It is not     intended to diagnose MRSA     infection nor to guide or     monitor treatment for     MRSA infections.   Medical History: Past Medical History  Diagnosis Date  . COPD (chronic obstructive pulmonary disease)   . AF (atrial fibrillation)   . Hypertension   . Gout   . Arthritis   . Small bowel obstruction   . Renal insufficiency   . Aortic stenosis   . Hydropneumothorax   . Diabetes mellitus   .  CHF (congestive heart failure)   . On home O2     qhs prn  . Pneumonia 05/04/2013    history of pneumonia  . Pneumonia 10/12  . Hydropneumothorax 2012   Medications:  Scheduled:  . antiseptic oral rinse  15 mL Mouth Rinse BID  . aspirin  81 mg Oral Daily  . ceFEPime (MAXIPIME) IV  2 g Intravenous Q24H  . diltiazem  360 mg Oral Daily  . enoxaparin (LOVENOX) injection  40 mg Subcutaneous Q24H  . feeding supplement  30 mL Oral QHS  . ferrous fumarate  1 tablet Oral BID  . fluticasone  1 puff Inhalation BID  . furosemide  40 mg Intravenous Daily  . guaiFENesin  1,200 mg Oral BID  . insulin aspart  0-20 Units Subcutaneous TID WC  . insulin aspart  0-5 Units Subcutaneous QHS  . insulin glargine  18 Units Subcutaneous Q24H  . levalbuterol  0.63 mg Nebulization QID  . methylPREDNISolone (SOLU-MEDROL) injection  40 mg Intravenous Q12H  . ondansetron (ZOFRAN) IV  4 mg Intravenous q12n4p  . polyethylene glycol  17 g Oral Daily  . sodium chloride  10-40 mL Intracatheter Q12H  . sodium chloride  3 mL Intravenous Q12H   Assessment: 77 yo M on day#11  Cefepime for PNA. Plans for thoracentesis today.  Renal function improved after discontinuation of Vancomycin.   Goal of Therapy:  Eradicate infection.  Plan:  Continue Cefepime 2gm IV every 24 hours. Monitor renal function and cx data  Duration of therapy per MD  Elson Clan 06/17/2013,11:02 AM

## 2013-06-17 NOTE — Progress Notes (Signed)
Subjective: He says he feels a little better. He has no new complaints. He is still short of breath but not as badly. His potassium is up this morning.  Objective: Vital signs in last 24 hours: Temp:  [97.4 F (36.3 C)-98 F (36.7 C)] 97.4 F (36.3 C) (06/18 0552) Pulse Rate:  [73-75] 75 (06/18 0552) Resp:  [20] 20 (06/18 0552) BP: (134-141)/(55-65) 136/65 mmHg (06/18 0552) SpO2:  [89 %-100 %] 98 % (06/18 0649) FiO2 (%):  [50 %] 50 % (06/18 0649) Weight:  [78.563 kg (173 lb 3.2 oz)] 78.563 kg (173 lb 3.2 oz) (06/18 0552) Weight change: 0.263 kg (9.3 oz) Last BM Date: 06/15/13  Intake/Output from previous day: 06/17 0701 - 06/18 0700 In: 557.8 [P.O.:320; I.V.:187.8; IV Piggyback:50] Out: 1725 [Urine:1725]  PHYSICAL EXAM General appearance: alert, cooperative and mild distress Resp: rhonchi bilaterally Cardio: irregularly irregular rhythm GI: soft, non-tender; bowel sounds normal; no masses,  no organomegaly Extremities: 1-2+ of his feet  Lab Results:    Basic Metabolic Panel:  Recent Labs  16/10/96 0439 06/17/13 0503  NA 134* 135  K 5.1 5.6*  CL 94* 94*  CO2 36* 35*  GLUCOSE 232* 366*  BUN 82* 80*  CREATININE 1.76* 1.56*  CALCIUM 8.9 8.9   Liver Function Tests: No results found for this basename: AST, ALT, ALKPHOS, BILITOT, PROT, ALBUMIN,  in the last 72 hours No results found for this basename: LIPASE, AMYLASE,  in the last 72 hours No results found for this basename: AMMONIA,  in the last 72 hours CBC:  Recent Labs  06/16/13 0439  WBC 10.3  NEUTROABS 6.9  HGB 8.0*  HCT 26.4*  MCV 93.3  PLT 147*   Cardiac Enzymes: No results found for this basename: CKTOTAL, CKMB, CKMBINDEX, TROPONINI,  in the last 72 hours BNP: No results found for this basename: PROBNP,  in the last 72 hours D-Dimer: No results found for this basename: DDIMER,  in the last 72 hours CBG:  Recent Labs  06/15/13 1142 06/15/13 1645 06/15/13 2039 06/16/13 0706 06/16/13 1145  06/16/13 1658  GLUCAP 324* 252* 304* 197* 352* 345*   Hemoglobin A1C: No results found for this basename: HGBA1C,  in the last 72 hours Fasting Lipid Panel: No results found for this basename: CHOL, HDL, LDLCALC, TRIG, CHOLHDL, LDLDIRECT,  in the last 72 hours Thyroid Function Tests: No results found for this basename: TSH, T4TOTAL, FREET4, T3FREE, THYROIDAB,  in the last 72 hours Anemia Panel:  Recent Labs  06/15/13 1053  VITAMINB12 405  FOLATE 7.3  FERRITIN 365*  TIBC 265  IRON 57  RETICCTPCT 7.0*   Coagulation: No results found for this basename: LABPROT, INR,  in the last 72 hours Urine Drug Screen: Drugs of Abuse  No results found for this basename: labopia, cocainscrnur, labbenz, amphetmu, thcu, labbarb    Alcohol Level: No results found for this basename: ETH,  in the last 72 hours Urinalysis: No results found for this basename: COLORURINE, APPERANCEUR, LABSPEC, PHURINE, GLUCOSEU, HGBUR, BILIRUBINUR, KETONESUR, PROTEINUR, UROBILINOGEN, NITRITE, LEUKOCYTESUR,  in the last 72 hours Misc. Labs:  ABGS No results found for this basename: PHART, PCO2, PO2ART, TCO2, HCO3,  in the last 72 hours CULTURES Recent Results (from the past 240 hour(s))  CULTURE, BLOOD (ROUTINE X 2)     Status: None   Collection Time    06/07/13  2:55 PM      Result Value Range Status   Specimen Description BLOOD LEFT ARM   Final  Special Requests BOTTLES DRAWN AEROBIC ONLY 6CC   Final   Culture NO GROWTH 7 DAYS   Final   Report Status 06/14/2013 FINAL   Final  CULTURE, BLOOD (ROUTINE X 2)     Status: None   Collection Time    06/07/13  3:20 PM      Result Value Range Status   Specimen Description BLOOD LEFT ARM   Final   Special Requests BOTTLES DRAWN AEROBIC AND ANAEROBIC 6CC   Final   Culture NO GROWTH 7 DAYS   Final   Report Status 06/14/2013 FINAL   Final  MRSA PCR SCREENING     Status: None   Collection Time    06/07/13  8:01 PM      Result Value Range Status   MRSA by PCR  NEGATIVE  NEGATIVE Final   Comment:            The GeneXpert MRSA Assay (FDA     approved for NASAL specimens     only), is one component of a     comprehensive MRSA colonization     surveillance program. It is not     intended to diagnose MRSA     infection nor to guide or     monitor treatment for     MRSA infections.   Studies/Results: Dg Chest Port 1 View  06/16/2013   *RADIOLOGY REPORT*  Clinical Data: Hypoxia.  PORTABLE CHEST - 1 VIEW  Comparison: 06/10/2013.  Findings: Large left pleural effusion with a significant increase in size.  Increased airspace opacity in the remaining aerated left upper lobe.  No significant change in right lung airspace opacity and prominence of the interstitial markings.  Stable median sternotomy wires and prosthetic heart valve.  Thoracic spine degenerative changes.  IMPRESSION:  1.  Significant increase in the size of a large left pleural effusion. 2.  Increased left lung atelectasis or pneumonia. 3.  Stable right lung airspace opacity suspicious for pneumonia and chronic interstitial lung disease.   Original Report Authenticated By: Beckie Salts, M.D.    Medications:  Scheduled: . antiseptic oral rinse  15 mL Mouth Rinse BID  . aspirin  81 mg Oral Daily  . ceFEPime (MAXIPIME) IV  2 g Intravenous Q24H  . diltiazem  360 mg Oral Daily  . enoxaparin (LOVENOX) injection  40 mg Subcutaneous Q24H  . feeding supplement  30 mL Oral QHS  . ferrous fumarate  1 tablet Oral BID  . fluticasone  1 puff Inhalation BID  . furosemide  40 mg Intravenous Daily  . guaiFENesin  1,200 mg Oral BID  . insulin aspart  0-15 Units Subcutaneous TID WC  . insulin aspart  0-5 Units Subcutaneous QHS  . insulin glargine  15 Units Subcutaneous Q24H  . levalbuterol  0.63 mg Nebulization QID  . methylPREDNISolone (SOLU-MEDROL) injection  40 mg Intravenous Q12H  . ondansetron (ZOFRAN) IV  4 mg Intravenous q12n4p  . polyethylene glycol  17 g Oral Daily  . sodium chloride  10-40 mL  Intracatheter Q12H  . sodium chloride  3 mL Intravenous Q12H  . sodium polystyrene  30 g Oral Once   Continuous:  QMV:HQIONG chloride, acetaminophen, levalbuterol, sodium chloride, sodium chloride  Assesment: He was admitted with healthcare associated pneumonia. He has multiple other medical problems including COPD which is severe. He had acute on chronic respiratory failure. He had acute on chronic diastolic congestive heart failure and that seems to have improved although he still has some edema.  He had acute on chronic renal failure and although his creatinine is pretty stable his BUN has gone up somewhat and his potassium is elevated this morning. He is not on any potassium replacement. He has a large pleural effusion and thoracentesis is planned for later today. He has atrial flutter with good control of his heart rate. His blood sugars not totally controlled Principal Problem:   HCAP (healthcare-associated pneumonia) Active Problems:   Atrial flutter   COPD (chronic obstructive pulmonary disease)   Hypertension   Type 2 diabetes mellitus, uncontrolled   Acute-on-chronic respiratory failure   Acute on chronic diastolic CHF (congestive heart failure), NYHA class 1   Anemia, normocytic normochromic   Acute on chronic renal failure    Plan: For thoracentesis today. He will have Kayexalate. I'm going to attempt to see if TED hose will help his pedal edema    LOS: 10 days   Anouk Critzer L 06/17/2013, 8:13 AM

## 2013-06-18 DIAGNOSIS — J90 Pleural effusion, not elsewhere classified: Secondary | ICD-10-CM | POA: Diagnosis present

## 2013-06-18 LAB — BASIC METABOLIC PANEL
Calcium: 8.9 mg/dL (ref 8.4–10.5)
GFR calc Af Amer: 61 mL/min — ABNORMAL LOW (ref >90)
GFR calc non Af Amer: 53 mL/min — ABNORMAL LOW (ref >90)
Potassium: 4.6 mEq/L (ref 3.5–5.1)
Sodium: 138 mEq/L (ref 135–145)

## 2013-06-18 LAB — GLUCOSE, CAPILLARY
Glucose-Capillary: 189 mg/dL — ABNORMAL HIGH (ref 70–99)
Glucose-Capillary: 284 mg/dL — ABNORMAL HIGH (ref 70–99)
Glucose-Capillary: 190 mg/dL — ABNORMAL HIGH (ref 70–99)

## 2013-06-18 MED ORDER — PREDNISONE 20 MG PO TABS
40.0000 mg | ORAL_TABLET | Freq: Every day | ORAL | Status: DC
  Administered 2013-06-18 – 2013-06-21 (×4): 40 mg via ORAL
  Filled 2013-06-18 (×5): qty 2

## 2013-06-18 MED ORDER — PEG 3350-KCL-NA BICARB-NACL 420 G PO SOLR
4000.0000 mL | Freq: Once | ORAL | Status: AC
  Administered 2013-06-18: 4000 mL via ORAL
  Filled 2013-06-18: qty 4000

## 2013-06-18 MED ORDER — BISACODYL 5 MG PO TBEC
10.0000 mg | DELAYED_RELEASE_TABLET | Freq: Once | ORAL | Status: AC
  Administered 2013-06-18: 10 mg via ORAL
  Filled 2013-06-18: qty 2

## 2013-06-18 MED ORDER — ENOXAPARIN SODIUM 40 MG/0.4ML ~~LOC~~ SOLN
40.0000 mg | SUBCUTANEOUS | Status: AC
  Administered 2013-06-18: 40 mg via SUBCUTANEOUS
  Filled 2013-06-18: qty 0.4

## 2013-06-18 MED ORDER — SODIUM CHLORIDE 0.9 % IV SOLN: INTRAVENOUS | Status: DC

## 2013-06-18 NOTE — Progress Notes (Signed)
Physical Therapy Treatment Patient Details Name: [name] [name] MRN: [id number] DOB: Oct 20, 1936 Today's Date: 06/18/2013 Time: 4098-1191 PT Time Calculation (min): 32 min  PT Assessment / Plan / Recommendation Comments on Treatment Session  Pt receiving prep for colonoscopy and is concerned about diarrhea...doesn't want to do any gait today.Marland Kitchen  He tolerated ther ex very well...on 5 L O2 with sat =93%.  After exercise, O2 sat=95%.  Minimal dyspnea.    Follow Up Recommendations        Does the patient have the potential to tolerate intense rehabilitation     Barriers to Discharge        Equipment Recommendations       Recommendations for Other Services    Frequency     Plan Discharge plan remains appropriate;Frequency remains appropriate    Precautions / Restrictions     Pertinent Vitals/Pain     Mobility  Bed Mobility Bed Mobility: Not assessed    Exercises General Exercises - Lower Extremity Ankle Circles/Pumps: Strengthening;Both;15 reps;Supine Quad Sets: AROM;Both;15 reps;Supine Gluteal Sets: AROM;Both;15 reps;Supine Short Arc Quad: AROM;Both;20 reps;Supine Heel Slides: Strengthening;Both;15 reps;Supine Hip ABduction/ADduction: Strengthening;Both;15 reps;Supine Straight Leg Raises: AROM;Both;10 reps;Supine   PT Diagnosis:    PT Problem List:   PT Treatment Interventions:     PT Goals    Visit Information  Last PT Received On: 06/18/13    Subjective Data  Subjective: I have to have a colonoscopy tomorrow.Marland KitchenMarland KitchenI don't think I can do much with you today because of the prep   Cognition       Balance     End of Session PT - End of Session Equipment Utilized During Treatment: Oxygen Activity Tolerance: Patient tolerated treatment well Patient left: in bed;with call bell/phone within reach;with family/visitor present   GP     Konrad Penta 06/18/2013, 3:44 PM

## 2013-06-18 NOTE — Progress Notes (Signed)
Subjective: He is doing much better after thoracentesis. He is back on nasal cannula. He has no new complaints.  Objective: Vital signs in last 24 hours: Temp:  [97.3 F (36.3 C)-98.2 F (36.8 C)] 97.3 F (36.3 C) (06/19 0431) Pulse Rate:  [76-114] 99 (06/19 0431) Resp:  [18] 18 (06/19 0431) BP: (120-151)/(65-79) 151/65 mmHg (06/19 0431) SpO2:  [92 %-98 %] 97 % (06/19 0644) FiO2 (%):  [5 %] 5 % (06/18 1300) Weight:  [76.386 kg (168 lb 6.4 oz)] 76.386 kg (168 lb 6.4 oz) (06/19 0431) Weight change: -2.177 kg (-4 lb 12.8 oz) Last BM Date: 06/17/13  Intake/Output from previous day: 06/18 0701 - 06/19 0700 In: 960 [P.O.:960] Out: 1900 [Urine:1900]  PHYSICAL EXAM General appearance: alert, cooperative and mild distress Resp: rhonchi bilaterally Cardio: irregularly irregular rhythm GI: soft, non-tender; bowel sounds normal; no masses,  no organomegaly Extremities: extremities normal, atraumatic, no cyanosis or edema  Lab Results:    Basic Metabolic Panel:  Recent Labs  16/10/96 0439 06/17/13 0503 06/17/13 1220  NA 134* 135  --   K 5.1 5.6*  --   CL 94* 94*  --   CO2 36* 35*  --   GLUCOSE 232* 366* 544*  BUN 82* 80*  --   CREATININE 1.76* 1.56*  --   CALCIUM 8.9 8.9  --    Liver Function Tests: No results found for this basename: AST, ALT, ALKPHOS, BILITOT, PROT, ALBUMIN,  in the last 72 hours No results found for this basename: LIPASE, AMYLASE,  in the last 72 hours No results found for this basename: AMMONIA,  in the last 72 hours CBC:  Recent Labs  06/16/13 0439  WBC 10.3  NEUTROABS 6.9  HGB 8.0*  HCT 26.4*  MCV 93.3  PLT 147*   Cardiac Enzymes: No results found for this basename: CKTOTAL, CKMB, CKMBINDEX, TROPONINI,  in the last 72 hours BNP: No results found for this basename: PROBNP,  in the last 72 hours D-Dimer: No results found for this basename: DDIMER,  in the last 72 hours CBG:  Recent Labs  06/16/13 2119 06/17/13 0735 06/17/13 1201  06/17/13 1629 06/17/13 2041 06/18/13 0738  GLUCAP 339* 292* 501* 357* 300* 189*   Hemoglobin A1C: No results found for this basename: HGBA1C,  in the last 72 hours Fasting Lipid Panel: No results found for this basename: CHOL, HDL, LDLCALC, TRIG, CHOLHDL, LDLDIRECT,  in the last 72 hours Thyroid Function Tests: No results found for this basename: TSH, T4TOTAL, FREET4, T3FREE, THYROIDAB,  in the last 72 hours Anemia Panel:  Recent Labs  06/15/13 1053  VITAMINB12 405  FOLATE 7.3  FERRITIN 365*  TIBC 265  IRON 57  RETICCTPCT 7.0*   Coagulation: No results found for this basename: LABPROT, INR,  in the last 72 hours Urine Drug Screen: Drugs of Abuse  No results found for this basename: labopia, cocainscrnur, labbenz, amphetmu, thcu, labbarb    Alcohol Level: No results found for this basename: ETH,  in the last 72 hours Urinalysis: No results found for this basename: COLORURINE, APPERANCEUR, LABSPEC, PHURINE, GLUCOSEU, HGBUR, BILIRUBINUR, KETONESUR, PROTEINUR, UROBILINOGEN, NITRITE, LEUKOCYTESUR,  in the last 72 hours Misc. Labs:  ABGS No results found for this basename: PHART, PCO2, PO2ART, TCO2, HCO3,  in the last 72 hours CULTURES Recent Results (from the past 240 hour(s))  BODY FLUID CULTURE     Status: None   Collection Time    06/17/13 11:20 AM      Result Value  Range Status   Specimen Description FLUID LEFT PLEURAL COLLECTED BY DOCTOR BOLES   Final   Special Requests IMMUNE:COMPROMISED   Final   Gram Stain     Final   Value: RARE WBC PRESENT, PREDOMINANTLY MONONUCLEAR     NO ORGANISMS SEEN   Culture NO GROWTH 1 DAY   Final   Report Status PENDING   Incomplete   Studies/Results: Dg Chest 1 View  06/17/2013   *RADIOLOGY REPORT*  Clinical Data: Left pleural effusion post thoracentesis  CHEST - 1 VIEW  Comparison: Expiratory AP chest radiograph compared to 06/16/2013  Findings: Marked decrease in left pleural effusion and left lung atelectasis post thoracentesis.  Residual pleural effusion and atelectasis at left base. No pneumothorax. Enlargement of cardiac silhouette post median sternotomy and AVR. Right arm PICC line tip projects over SVC. Atherosclerotic calcification aorta. Persistent right basilar infiltrate. Diffuse osseous demineralization.  IMPRESSION: Marked decrease in left pleural effusion and basilar atelectasis post thoracentesis. No pneumothorax. Enlargement of cardiac silhouette post AVR. Persistent right basilar infiltrate.   Original Report Authenticated By: Ulyses Southward, M.D.   Dg Chest Port 1 View  06/16/2013   *RADIOLOGY REPORT*  Clinical Data: Hypoxia.  PORTABLE CHEST - 1 VIEW  Comparison: 06/10/2013.  Findings: Large left pleural effusion with a significant increase in size.  Increased airspace opacity in the remaining aerated left upper lobe.  No significant change in right lung airspace opacity and prominence of the interstitial markings.  Stable median sternotomy wires and prosthetic heart valve.  Thoracic spine degenerative changes.  IMPRESSION:  1.  Significant increase in the size of a large left pleural effusion. 2.  Increased left lung atelectasis or pneumonia. 3.  Stable right lung airspace opacity suspicious for pneumonia and chronic interstitial lung disease.   Original Report Authenticated By: Beckie Salts, M.D.   US Thoracentesis Asp Pleural Space W/img Guide  06/17/2013   *RADIOLOGY REPORT*  CLINICAL DATA: Left pleural effusion  ULTRASOUND GUIDED LEFT THORACENTESIS:  Technique: After explanation of procedure and risks, written informed consent for thoracentesis obtained. Time out protocol followed. Left pleural effusion localized by ultrasound. Site at posterior leftchest prepped and draped in usual sterile fashion. Skin and chest wall anesthetized with 6 ml of 1% lidocaine. Standard 8-French thoracentesis catheter placed into left pleural space. 1480 ml of orange colored fluid aspirated by syringe pump. Procedure tolerated well by  patient without immediate complication.  IMPRESSION: Ultrasound-guided left thoracentesis with removal of 1480 ml of orange colored left pleural fluid. Fluid sent to laboratory for requested analysis.   Original Report Authenticated By: Ulyses Southward, M.D.    Medications:  Prior to Admission:  Prescriptions prior to admission  Medication Sig Dispense Refill  . aspirin 81 MG chewable tablet Chew 81 mg by mouth daily.      Marland Kitchen diltiazem (CARDIZEM CD) 300 MG 24 hr capsule Take 300 mg by mouth daily.      . feeding supplement (PRO-STAT SUGAR FREE 64) LIQD Take 30 mLs by mouth at bedtime.      . fluticasone (FLOVENT HFA) 110 MCG/ACT inhaler Inhale 1 puff into the lungs 2 (two) times daily.      . furosemide (LASIX) 80 MG tablet Take 80 mg by mouth daily.      Marland Kitchen glyBURIDE (DIABETA) 5 MG tablet Take 1 tablet (5 mg total) by mouth 2 (two) times daily with a meal.      . insulin aspart (NOVOLOG) 100 UNIT/ML injection Inject 3-15 Units into the skin 3 (  three) times daily with meals. Sliding scale as follows: 121-150=3 units 151-200=4 units 201-250=7 units 251-300=9 units 301-350=12 units 351-400=15 units      . insulin glargine (LANTUS) 100 UNIT/ML injection Inject 0.1 mLs (10 Units total) into the skin daily.  10 mL  12  . ipratropium-albuterol (DUONEB) 0.5-2.5 (3) MG/3ML SOLN Take 3 mLs by nebulization 4 (four) times daily.      . magnesium hydroxide (MILK OF MAGNESIA) 400 MG/5ML suspension Take 30 mLs by mouth daily as needed for constipation.      . polyethylene glycol (MIRALAX / GLYCOLAX) packet Take 17 g by mouth daily.      . potassium chloride SA (K-DUR,KLOR-CON) 20 MEQ tablet Take 20 mEq by mouth daily.      Marland Kitchen acetaminophen (TYLENOL) 500 MG tablet Take 1,000 mg by mouth every 6 (six) hours as needed. Pain      . sodium chloride (OCEAN) 0.65 % SOLN nasal spray Place 1 spray into the nose as needed for congestion.    0   Scheduled: . antiseptic oral rinse  15 mL Mouth Rinse BID  . aspirin  81  mg Oral Daily  . ceFEPime (MAXIPIME) IV  2 g Intravenous Q24H  . diltiazem  360 mg Oral Daily  . enoxaparin (LOVENOX) injection  40 mg Subcutaneous Q24H  . feeding supplement  30 mL Oral QHS  . ferrous fumarate  1 tablet Oral BID  . fluticasone  1 puff Inhalation BID  . furosemide  40 mg Intravenous Daily  . guaiFENesin  1,200 mg Oral BID  . insulin aspart  0-20 Units Subcutaneous TID WC  . insulin aspart  0-5 Units Subcutaneous QHS  . insulin glargine  18 Units Subcutaneous Q24H  . levalbuterol  0.63 mg Nebulization QID  . ondansetron (ZOFRAN) IV  4 mg Intravenous q12n4p  . polyethylene glycol  17 g Oral Daily  . predniSONE  40 mg Oral Q breakfast  . sodium chloride  10-40 mL Intracatheter Q12H  . sodium chloride  3 mL Intravenous Q12H   Continuous:  OZH:YQMVHQ chloride, acetaminophen, levalbuterol, sodium chloride, sodium chloride  Assesment: He has healthcare associated pneumonia. He had a large pleural effusion and had thoracentesis for this. He has had his course complicated by acute on chronic diastolic congestive heart failure and acute on chronic renal failure normocytic anemia with heme positive stools. He is improving. He will have a very long recovery period. He has been on IV antibiotics now for 11 days and I would like him to be on for 2 weeks. That could be finished as an outpatient if he gets well enough to go home before he finishes his antibiotics. I don't think that's likely. Principal Problem:   HCAP (healthcare-associated pneumonia) Active Problems:   Atrial flutter   COPD (chronic obstructive pulmonary disease)   Hypertension   Type 2 diabetes mellitus, uncontrolled   Acute-on-chronic respiratory failure   Acute on chronic diastolic CHF (congestive heart failure), NYHA class 1   Anemia, normocytic normochromic   Acute on chronic renal failure    Plan: Intensify his physical therapy. He should be okay for any procedures needed from a GI point of view at this  time. Continue diuresis. Check his laboratory work.    LOS: 11 days   [name] [name] L 06/18/2013, 8:42 AM

## 2013-06-18 NOTE — Progress Notes (Signed)
Spoke with Dr. Jena Gauss. We will give today's (1700) dose of Lovenox NOW and then hold until after procedures tomorrow. I spoke with patient's nurse, Soledad Gerlach, and changed order.

## 2013-06-18 NOTE — Progress Notes (Signed)
Subjective:  Feels better. No melena, brbpr, vomiting, abd pain. Breathing improved.  Objective: Vital signs in last 24 hours: Temp:  [97.3 F (36.3 C)-98.2 F (36.8 C)] 97.3 F (36.3 C) (06/19 0431) Pulse Rate:  [76-114] 99 (06/19 0431) Resp:  [18] 18 (06/19 0431) BP: (120-151)/(65-79) 151/65 mmHg (06/19 0431) SpO2:  [92 %-98 %] 97 % (06/19 0644) FiO2 (%):  [5 %] 5 % (06/18 1300) Weight:  [168 lb 6.4 oz (76.386 kg)] 168 lb 6.4 oz (76.386 kg) (06/19 0431) Last BM Date: 06/17/13 General:   Alert,  Well-developed, well-nourished, pleasant and cooperative in NAD. Daughter at bedside. Head:  Normocephalic and atraumatic. Eyes:  Sclera clear, no icterus.  Chest: bilateral rhonchi.    Heart:  Regular rate, + murmurs. Abdomen:  Soft, nontender and nondistended. Normal bowel sounds, without guarding, and without rebound.  Extremities:  Without clubbing, deformity or edema. Neurologic:  Alert and  oriented x4;  grossly normal neurologically. Skin:  Intact without significant lesions or rashes. Psych:  Alert and cooperative. Normal mood and affect.  Intake/Output from previous day: 06/18 0701 - 06/19 0700 In: 960 [P.O.:960] Out: 1900 [Urine:1900] Intake/Output this shift:    Lab Results: CBC  Recent Labs  06/16/13 0439  WBC 10.3  HGB 8.0*  HCT 26.4*  MCV 93.3  PLT 147*   BMET  Recent Labs  06/16/13 0439 06/17/13 0503 06/17/13 1220 06/18/13 0831  NA 134* 135  --  138  K 5.1 5.6*  --  4.6  CL 94* 94*  --  95*  CO2 36* 35*  --  41*  GLUCOSE 232* 366* 544* 217*  BUN 82* 80*  --  73*  CREATININE 1.76* 1.56*  --  1.29  CALCIUM 8.9 8.9  --  8.9   LFTs No results found for this basename: BILITOT, BILIDIR, IBILI, ALKPHOS, AST, ALT, PROT, ALBUMIN,  in the last 72 hours No results found for this basename: LIPASE,  in the last 72 hours PT/INR No results found for this basename: LABPROT, INR,  in the last 72 hours    Imaging Studies: Dg Chest 1 View  06/17/2013    *RADIOLOGY REPORT*  Clinical Data: Left pleural effusion post thoracentesis  CHEST - 1 VIEW  Comparison: Expiratory AP chest radiograph compared to 06/16/2013  Findings: Marked decrease in left pleural effusion and left lung atelectasis post thoracentesis. Residual pleural effusion and atelectasis at left base. No pneumothorax. Enlargement of cardiac silhouette post median sternotomy and AVR. Right arm PICC line tip projects over SVC. Atherosclerotic calcification aorta. Persistent right basilar infiltrate. Diffuse osseous demineralization.  IMPRESSION: Marked decrease in left pleural effusion and basilar atelectasis post thoracentesis. No pneumothorax. Enlargement of cardiac silhouette post AVR. Persistent right basilar infiltrate.   Original Report Authenticated By: Ulyses Southward, M.D.       US Thoracentesis Asp Pleural Space W/img Guide  06/17/2013   *RADIOLOGY REPORT*  CLINICAL DATA: Left pleural effusion  ULTRASOUND GUIDED LEFT THORACENTESIS:  Technique: After explanation of procedure and risks, written informed consent for thoracentesis obtained. Time out protocol followed. Left pleural effusion localized by ultrasound. Site at posterior leftchest prepped and draped in usual sterile fashion. Skin and chest wall anesthetized with 6 ml of 1% lidocaine. Standard 8-French thoracentesis catheter placed into left pleural space. 1480 ml of orange colored fluid aspirated by syringe pump. Procedure tolerated well by patient without immediate complication.  IMPRESSION: Ultrasound-guided left thoracentesis with removal of 1480 ml of orange colored left pleural fluid. Fluid sent to  laboratory for requested analysis.   Original Report Authenticated By: Ulyses Southward, M.D.  [2 weeks]   Assessment: 77 y/o male with complicated history including two hospitalizations since 03/2013 for Afib/Aflutter initially and then for respiratory failure as outlined above. He was on Xarelto after 03/2013 but anticoagulation was  discontinued after prolonged hospitalization in 04/2013. Now he is back on Lovenox. Cardiology as recommended anticoagulation if feasible given substantial risk for thromboembolism in setting of chronic a.fib/flutter. Heme positive X 1 this admission. No overt GI bleeding. He was transfused back in 04/2013. H/H as been stable this admission. He is otherwise void of any GI symptoms. His breathing status is much better s/p thoracentesis yesterday. He has been given ok to proceed with procedures as necessary.  Plan: 1. Colonoscopy +/- EGD tomorrow for anemia/heme positive stool. Discussed with patient and daughter who are both agreeable. 2. Hold iron, Miralax. 3. May hold Lovenox depending on timing of procedure. To determine and give appropriate order as needed.   LOS: 11 days   Tana Coast  06/18/2013, 9:42 AM  Attending note:  Agree with above; pt appears to be fit for endoscopic evaluation tomorrow.  Lovenox held - pt understands and is agreeable to Dr. Darrick Penna performing procedures in my absence.

## 2013-06-19 ENCOUNTER — Encounter (HOSPITAL_COMMUNITY)
Admission: EM | Disposition: A | Discharge status: Home Health | Payer: Self-pay | Source: Home / Self Care | Attending: Pulmonary Disease

## 2013-06-19 DIAGNOSIS — D649 Anemia, unspecified: Secondary | ICD-10-CM

## 2013-06-19 DIAGNOSIS — R195 Other fecal abnormalities: Secondary | ICD-10-CM

## 2013-06-19 DIAGNOSIS — K259 Gastric ulcer, unspecified as acute or chronic, without hemorrhage or perforation: Secondary | ICD-10-CM

## 2013-06-19 DIAGNOSIS — K648 Other hemorrhoids: Secondary | ICD-10-CM

## 2013-06-19 DIAGNOSIS — K2960 Other gastritis without bleeding: Secondary | ICD-10-CM

## 2013-06-19 DIAGNOSIS — K261 Acute duodenal ulcer with perforation: Secondary | ICD-10-CM

## 2013-06-19 HISTORY — PX: ESOPHAGOGASTRODUODENOSCOPY: SHX5428

## 2013-06-19 HISTORY — PX: COLONOSCOPY: SHX5424

## 2013-06-19 LAB — CBC WITH DIFFERENTIAL/PLATELET
Eosinophils Absolute: 0 10*3/uL (ref 0.0–0.7)
Eosinophils Relative: 0 % (ref 0–5)
Monocytes Absolute: 3.1 10*3/uL — ABNORMAL HIGH (ref 0.1–1.0)
Monocytes Relative: 19 % — ABNORMAL HIGH (ref 3–12)
Neutro Abs: 12.4 10*3/uL — ABNORMAL HIGH (ref 1.7–7.7)
Neutrophils Relative %: 76 % (ref 43–77)
Platelets: 153 10*3/uL (ref 150–400)
RBC: 3.19 MIL/uL — ABNORMAL LOW (ref 4.22–5.81)
WBC: 16.3 10*3/uL — ABNORMAL HIGH (ref 4.0–10.5)
nRBC: 0 /100 WBC

## 2013-06-19 LAB — BASIC METABOLIC PANEL
Calcium: 8.6 mg/dL (ref 8.4–10.5)
GFR calc Af Amer: 73 mL/min — ABNORMAL LOW (ref >90)
GFR calc non Af Amer: 63 mL/min — ABNORMAL LOW (ref >90)
Sodium: 137 mEq/L (ref 135–145)

## 2013-06-19 LAB — GLUCOSE, CAPILLARY
Glucose-Capillary: 109 mg/dL — ABNORMAL HIGH (ref 70–99)
Glucose-Capillary: 86 mg/dL (ref 70–99)

## 2013-06-19 LAB — PATHOLOGIST SMEAR REVIEW

## 2013-06-19 SURGERY — COLONOSCOPY
Anesthesia: Moderate Sedation

## 2013-06-19 MED ORDER — MIDAZOLAM HCL 5 MG/5ML IJ SOLN
INTRAMUSCULAR | Status: AC
  Filled 2013-06-19: qty 5

## 2013-06-19 MED ORDER — STERILE WATER FOR IRRIGATION IR SOLN
Status: DC | PRN
  Administered 2013-06-19: 15:00:00

## 2013-06-19 MED ORDER — MEPERIDINE HCL 100 MG/ML IJ SOLN
INTRAMUSCULAR | Status: AC
  Filled 2013-06-19: qty 1

## 2013-06-19 MED ORDER — DEXTROSE 50 % IV SOLN: 25.0000 mL | Freq: Once | INTRAVENOUS | Status: AC | PRN

## 2013-06-19 MED ORDER — MEPERIDINE HCL 100 MG/ML IJ SOLN
INTRAMUSCULAR | Status: DC | PRN
  Administered 2013-06-19: 25 mg via INTRAVENOUS

## 2013-06-19 MED ORDER — PANTOPRAZOLE SODIUM 40 MG PO TBEC
40.0000 mg | DELAYED_RELEASE_TABLET | Freq: Two times a day (BID) | ORAL | Status: DC
  Administered 2013-06-19 – 2013-06-22 (×6): 40 mg via ORAL
  Filled 2013-06-19 (×6): qty 1

## 2013-06-19 MED ORDER — DEXTROSE 50 % IV SOLN
INTRAVENOUS | Status: AC
  Filled 2013-06-19: qty 50

## 2013-06-19 MED ORDER — MIDAZOLAM HCL 5 MG/5ML IJ SOLN
INTRAMUSCULAR | Status: DC | PRN
  Administered 2013-06-19 (×2): 1 mg via INTRAVENOUS
  Administered 2013-06-19: 2 mg via INTRAVENOUS

## 2013-06-19 MED ORDER — DEXTROSE 5 % IV SOLN
2.0000 g | Freq: Two times a day (BID) | INTRAVENOUS | Status: AC
  Administered 2013-06-19 – 2013-06-20 (×4): 2 g via INTRAVENOUS
  Filled 2013-06-19 (×5): qty 2

## 2013-06-19 NOTE — Progress Notes (Signed)
Subjective: He says he feels fairly well. He had somewhat of a difficult night because of being prepared for colonoscopy. Colonoscopy will be done later today. He says otherwise he feels pretty well. He is not as short of breath.  Objective: Vital signs in last 24 hours: Temp:  [97.2 F (36.2 C)-98.4 F (36.9 C)] 97.2 F (36.2 C) (06/20 0437) Pulse Rate:  [77-85] 85 (06/20 0437) Resp:  [18] 18 (06/20 0437) BP: (115-133)/(61-72) 125/64 mmHg (06/20 0437) SpO2:  [92 %-100 %] 98 % (06/20 0656) Weight:  [79.6 kg (175 lb 7.8 oz)] 79.6 kg (175 lb 7.8 oz) (06/20 0437) Weight change: 3.214 kg (7 lb 1.4 oz) Last BM Date: 06/18/13  Intake/Output from previous day: 06/19 0701 - 06/20 0700 In: 700 [P.O.:600; IV Piggyback:100] Out: 2100 [Urine:2100]  PHYSICAL EXAM General appearance: alert, cooperative and no distress Resp: clear to auscultation bilaterally Cardio: irregularly irregular rhythm GI: soft, non-tender; bowel sounds normal; no masses,  no organomegaly Extremities: extremities normal, atraumatic, no cyanosis or edema  Lab Results:    Basic Metabolic Panel:  Recent Labs  78/29/56 0831 06/19/13 0507  NA 138 137  K 4.6 3.6  CL 95* 92*  CO2 41* 40*  GLUCOSE 217* 67*  BUN 73* 62*  CREATININE 1.29 1.11  CALCIUM 8.9 8.6   Liver Function Tests: No results found for this basename: AST, ALT, ALKPHOS, BILITOT, PROT, ALBUMIN,  in the last 72 hours No results found for this basename: LIPASE, AMYLASE,  in the last 72 hours No results found for this basename: AMMONIA,  in the last 72 hours CBC:  Recent Labs  06/19/13 0507  WBC 16.3*  NEUTROABS 12.4*  HGB 9.5*  HCT 30.1*  MCV 94.4  PLT 153   Cardiac Enzymes: No results found for this basename: CKTOTAL, CKMB, CKMBINDEX, TROPONINI,  in the last 72 hours BNP: No results found for this basename: PROBNP,  in the last 72 hours D-Dimer: No results found for this basename: DDIMER,  in the last 72 hours CBG:  Recent Labs   06/18/13 0738 06/18/13 1128 06/18/13 1622 06/18/13 2105 06/19/13 0724 06/19/13 0829  GLUCAP 189* 284* 282* 190* 59* 109*   Hemoglobin A1C: No results found for this basename: HGBA1C,  in the last 72 hours Fasting Lipid Panel: No results found for this basename: CHOL, HDL, LDLCALC, TRIG, CHOLHDL, LDLDIRECT,  in the last 72 hours Thyroid Function Tests: No results found for this basename: TSH, T4TOTAL, FREET4, T3FREE, THYROIDAB,  in the last 72 hours Anemia Panel: No results found for this basename: VITAMINB12, FOLATE, FERRITIN, TIBC, IRON, RETICCTPCT,  in the last 72 hours Coagulation: No results found for this basename: LABPROT, INR,  in the last 72 hours Urine Drug Screen: Drugs of Abuse  No results found for this basename: labopia, cocainscrnur, labbenz, amphetmu, thcu, labbarb    Alcohol Level: No results found for this basename: ETH,  in the last 72 hours Urinalysis: No results found for this basename: COLORURINE, APPERANCEUR, LABSPEC, PHURINE, GLUCOSEU, HGBUR, BILIRUBINUR, KETONESUR, PROTEINUR, UROBILINOGEN, NITRITE, LEUKOCYTESUR,  in the last 72 hours Misc. Labs:  ABGS No results found for this basename: PHART, PCO2, PO2ART, TCO2, HCO3,  in the last 72 hours CULTURES Recent Results (from the past 240 hour(s))  AFB CULTURE WITH SMEAR     Status: None   Collection Time    06/17/13 11:20 AM      Result Value Range Status   Specimen Description FLUID LEFT PLEURAL COLLECTED BY DOCTOR BOLES  Final   Special Requests IMMUNE:COMPROMISED   Final   ACID FAST SMEAR NO ACID FAST BACILLI SEEN   Final   Culture     Final   Value: CULTURE WILL BE EXAMINED FOR 6 WEEKS BEFORE ISSUING A FINAL REPORT   Report Status PENDING   Incomplete  BODY FLUID CULTURE     Status: None   Collection Time    06/17/13 11:20 AM      Result Value Range Status   Specimen Description FLUID LEFT PLEURAL COLLECTED BY DOCTOR BOLES   Final   Special Requests IMMUNE:COMPROMISED   Final   Gram Stain      Final   Value: RARE WBC PRESENT, PREDOMINANTLY MONONUCLEAR     NO ORGANISMS SEEN   Culture NO GROWTH 1 DAY   Final   Report Status PENDING   Incomplete   Studies/Results: Dg Chest 1 View  06/17/2013   *RADIOLOGY REPORT*  Clinical Data: Left pleural effusion post thoracentesis  CHEST - 1 VIEW  Comparison: Expiratory AP chest radiograph compared to 06/16/2013  Findings: Marked decrease in left pleural effusion and left lung atelectasis post thoracentesis. Residual pleural effusion and atelectasis at left base. No pneumothorax. Enlargement of cardiac silhouette post median sternotomy and AVR. Right arm PICC line tip projects over SVC. Atherosclerotic calcification aorta. Persistent right basilar infiltrate. Diffuse osseous demineralization.  IMPRESSION: Marked decrease in left pleural effusion and basilar atelectasis post thoracentesis. No pneumothorax. Enlargement of cardiac silhouette post AVR. Persistent right basilar infiltrate.   Original Report Authenticated By: Ulyses Southward, M.D.   US Thoracentesis Asp Pleural Space W/img Guide  06/17/2013   *RADIOLOGY REPORT*  CLINICAL DATA: Left pleural effusion  ULTRASOUND GUIDED LEFT THORACENTESIS:  Technique: After explanation of procedure and risks, written informed consent for thoracentesis obtained. Time out protocol followed. Left pleural effusion localized by ultrasound. Site at posterior leftchest prepped and draped in usual sterile fashion. Skin and chest wall anesthetized with 6 ml of 1% lidocaine. Standard 8-French thoracentesis catheter placed into left pleural space. 1480 ml of orange colored fluid aspirated by syringe pump. Procedure tolerated well by patient without immediate complication.  IMPRESSION: Ultrasound-guided left thoracentesis with removal of 1480 ml of orange colored left pleural fluid. Fluid sent to laboratory for requested analysis.   Original Report Authenticated By: Ulyses Southward, M.D.    Medications:  Prior to Admission:   Prescriptions prior to admission  Medication Sig Dispense Refill  . aspirin 81 MG chewable tablet Chew 81 mg by mouth daily.      Marland Kitchen diltiazem (CARDIZEM CD) 300 MG 24 hr capsule Take 300 mg by mouth daily.      . feeding supplement (PRO-STAT SUGAR FREE 64) LIQD Take 30 mLs by mouth at bedtime.      . fluticasone (FLOVENT HFA) 110 MCG/ACT inhaler Inhale 1 puff into the lungs 2 (two) times daily.      . furosemide (LASIX) 80 MG tablet Take 80 mg by mouth daily.      Marland Kitchen glyBURIDE (DIABETA) 5 MG tablet Take 1 tablet (5 mg total) by mouth 2 (two) times daily with a meal.      . insulin aspart (NOVOLOG) 100 UNIT/ML injection Inject 3-15 Units into the skin 3 (three) times daily with meals. Sliding scale as follows: 121-150=3 units 151-200=4 units 201-250=7 units 251-300=9 units 301-350=12 units 351-400=15 units      . insulin glargine (LANTUS) 100 UNIT/ML injection Inject 0.1 mLs (10 Units total) into the skin daily.  10 mL  12  . ipratropium-albuterol (DUONEB) 0.5-2.5 (3) MG/3ML SOLN Take 3 mLs by nebulization 4 (four) times daily.      . magnesium hydroxide (MILK OF MAGNESIA) 400 MG/5ML suspension Take 30 mLs by mouth daily as needed for constipation.      . polyethylene glycol (MIRALAX / GLYCOLAX) packet Take 17 g by mouth daily.      . potassium chloride SA (K-DUR,KLOR-CON) 20 MEQ tablet Take 20 mEq by mouth daily.      Marland Kitchen acetaminophen (TYLENOL) 500 MG tablet Take 1,000 mg by mouth every 6 (six) hours as needed. Pain      . sodium chloride (OCEAN) 0.65 % SOLN nasal spray Place 1 spray into the nose as needed for congestion.    0   Scheduled: . antiseptic oral rinse  15 mL Mouth Rinse BID  . aspirin  81 mg Oral Daily  . ceFEPime (MAXIPIME) IV  2 g Intravenous Q24H  . dextrose      . diltiazem  360 mg Oral Daily  . feeding supplement  30 mL Oral QHS  . fluticasone  1 puff Inhalation BID  . furosemide  40 mg Intravenous Daily  . guaiFENesin  1,200 mg Oral BID  . insulin aspart  0-20 Units  Subcutaneous TID WC  . insulin aspart  0-5 Units Subcutaneous QHS  . insulin glargine  18 Units Subcutaneous Q24H  . levalbuterol  0.63 mg Nebulization QID  . ondansetron (ZOFRAN) IV  4 mg Intravenous q12n4p  . predniSONE  40 mg Oral Q breakfast  . sodium chloride  10-40 mL Intracatheter Q12H  . sodium chloride  3 mL Intravenous Q12H   Continuous: . sodium chloride     AVW:UJWJXB chloride, acetaminophen, dextrose, levalbuterol, sodium chloride, sodium chloride  Assesment: He looks much better. He was admitted with healthcare associated pneumonia complicated by acute on chronic diastolic heart failure. His heart failure is clearly better. He is improving as far as the pneumonia is concerned based on his post thoracentesis chest x-ray. He had acute on chronic renal failure and his renal function is improving. He had pleural effusion and had thoracentesis which has helped. He has diabetes and we discussed the fact that he is probably going to have to be on some insulin at home at least short-term Principal Problem:   HCAP (healthcare-associated pneumonia) Active Problems:   Atrial flutter   COPD (chronic obstructive pulmonary disease)   Hypertension   Type 2 diabetes mellitus, uncontrolled   Acute-on-chronic respiratory failure   Acute on chronic diastolic CHF (congestive heart failure), NYHA class 1   Anemia, normocytic normochromic   Acute on chronic renal failure   Pleural effusion    Plan: For colonoscopy today. It looks like he may finish his IV antibiotics as an inpatient. He will switch to oral Lasix tomorrow. He may be able to be discharged home on the 22nd. He would have finished antibiotics at that time.    LOS: 12 days   [name] [name] 06/19/2013, 8:43 AM

## 2013-06-19 NOTE — Progress Notes (Signed)
Patient ID: [name] [name], male   DOB: [birth year], 77 y.o.   MRN: [id number] Patient more comfortable after having thoracentesis. For colonoscopy today. BUN and creatinine improved. Still diuresing on 40 mg of IV Lasix daily. Rate well controlled. Exam is unremarkable with minimal JVD and no edema. Discussed with Dr. [name] and he will change to by mouth Lasix tomorrow. No further cardiac input at this point. Restart anticoagulation once cleared by GI.

## 2013-06-19 NOTE — Progress Notes (Signed)
CRITICAL VALUE ALERT  Critical value received:  CO2- 40  Date of notification:  06/19/13  Time of notification:  0645  Critical value read back:yes  Nurse who received alert:  Linward Natal, RN  MD notified (1st page):  N/A  Time of first page:  N/A  MD notified (2nd page):  Time of second page:  Responding MD:  N/A  Time MD responded:  N/A  Patient's CO2 yesterday was 41. Patient alert and oriented and stable. Day nurse was notified of the value and will make the doctor aware.

## 2013-06-19 NOTE — Op Note (Signed)
[facility] [address] [place], [address]   ENDOSCOPY PROCEDURE REPORT  PATIENT: [name], [name]  MR#: [id number] BIRTHDATE: April 22, 1936 , 77  yrs. old GENDER: Male  ENDOSCOPIST: Jonette Eva, MD REFERRED QI:ONGEXBM, Alita Chyle  PROCEDURE DATE: 06/19/2013 PROCEDURE:   EGD w/ biopsy for H.pylori  INDICATIONS:coffee ground emesis, heme pos stools/anemia,  hemoptysis on anticoagulation. MEDICATIONS: TCS+ Versed 1mg  IV TOPICAL ANESTHETIC:   Cetacaine Spray  DESCRIPTION OF PROCEDURE:     Physical exam was performed.  Informed consent was obtained from the patient after explaining the benefits, risks, and alternatives to the procedure.  The patient was connected to the monitor and placed in the left lateral position.  Continuous oxygen was provided by nasal cannula and IV medicine administered through an indwelling cannula.  After administration of sedation, the patients esophagus was intubated and the [id number] ([id number])  endoscope was advanced under direct visualization to the second portion of the duodenum.  The scope was removed slowly by carefully examining the color, texture, anatomy, and integrity of the mucosa on the way out.  The patient was recovered in endoscopy and discharged home in satisfactory condition.   ESOPHAGUS: A stricture was found at the gastroesophageal junction. The stenosis was traversable with the endoscope.  STOMACH: Multiple small irregular shaped, deep(4-5), shallow and clean-based ulcers, ranging between 3-5 mm in size, with surrounding edema and heaped up edges were found in the gastric body. OLD BLOOD SEEN IN STOMACH. Biopsies were taken around the ulcers.   Moderate erosive gastritis (inflammation) was found in the gastric antrum.  Multiple biopsies(6) were performed using cold forceps.   DUODENUM: The duodenal mucosa showed no abnormalities in the bulb and second portion of the duodenum.    NO OLD BLOOD OR FRESH  BLOOD IN DUODENUM.  COMPLICATIONS:   None  ENDOSCOPIC IMPRESSION: 1.   Stricture was found at the gastroesophageal junction 2.   ANEMIA/HEME POS STOOL/ COFFEE GROUND EMESIS DUE TO GASTRIC ulcers/GASTRITIS  RECOMMENDATIONS: BID PPI SOFT MECHANICAL/LOW FAT DIET NO ASA/NSIDSS/ANTI-COAGULATION FOR ONE MONTH OPV W/ DR. Jena Gauss IN 2 MOS REPEAT EGD IN 3 MOS TO ASSESS HEALING.   REPEAT EXAM:   _______________________________ Rosalie DoctorJonette Eva, MD 06/19/2013 4:24 PM       PATIENT NAME:  [name], [name] MR#: [id number]

## 2013-06-19 NOTE — Progress Notes (Signed)
ANTIBIOTIC CONSULT NOTE   Pharmacy Consult for Cefepime Indication: pneumonia  No Known Allergies  Patient Measurements: Height: 5\' 6"  (167.6 cm) Weight: 175 lb 7.8 oz (79.6 kg) IBW/kg (Calculated) : 63.8  Vital Signs: Temp: 97.2 F (36.2 C) (06/20 0437) Temp src: Oral (06/20 0437) BP: 138/73 mmHg (06/20 1014) Pulse Rate: 81 (06/20 1014) Intake/Output from previous day: 06/19 0701 - 06/20 0700 In: 700 [P.O.:600; IV Piggyback:100] Out: 2100 [Urine:2100] Intake/Output from this shift: Total I/O In: 0  Out: 500 [Urine:500]  Labs:  Recent Labs  06/17/13 0503 06/18/13 0831 06/19/13 0507  WBC  --   --  16.3*  HGB  --   --  9.5*  PLT  --   --  153  CREATININE 1.56* 1.29 1.11   Estimated Creatinine Clearance: 57 ml/min (by C-G formula based on Cr of 1.11). No results found for this basename: VANCOTROUGH, Leodis Binet, VANCORANDOM, GENTTROUGH, GENTPEAK, GENTRANDOM, TOBRATROUGH, TOBRAPEAK, TOBRARND, AMIKACINPEAK, AMIKACINTROU, AMIKACIN,  in the last 72 hours  Microbiology: Recent Results (from the past 720 hour(s))  BODY FLUID CULTURE     Status: None   Collection Time    05/22/13 11:38 AM      Result Value Range Status   Specimen Description PLEURAL LEFT FLUID   Final   Special Requests 7.0ML FLUID   Final   Gram Stain     Final   Value: NO WBC SEEN     NO ORGANISMS SEEN   Culture NO GROWTH 3 DAYS   Final   Report Status 05/25/2013 FINAL   Final  CULTURE, BLOOD (ROUTINE X 2)     Status: None   Collection Time    06/07/13  2:55 PM      Result Value Range Status   Specimen Description BLOOD LEFT ARM   Final   Special Requests BOTTLES DRAWN AEROBIC ONLY 6CC   Final   Culture NO GROWTH 7 DAYS   Final   Report Status 06/14/2013 FINAL   Final  CULTURE, BLOOD (ROUTINE X 2)     Status: None   Collection Time    06/07/13  3:20 PM      Result Value Range Status   Specimen Description BLOOD LEFT ARM   Final   Special Requests BOTTLES DRAWN AEROBIC AND ANAEROBIC 6CC   Final    Culture NO GROWTH 7 DAYS   Final   Report Status 06/14/2013 FINAL   Final  MRSA PCR SCREENING     Status: None   Collection Time    06/07/13  8:01 PM      Result Value Range Status   MRSA by PCR NEGATIVE  NEGATIVE Final   Comment:            The GeneXpert MRSA Assay (FDA     approved for NASAL specimens     only), is one component of a     comprehensive MRSA colonization     surveillance program. It is not     intended to diagnose MRSA     infection nor to guide or     monitor treatment for     MRSA infections.  AFB CULTURE WITH SMEAR     Status: None   Collection Time    06/17/13 11:20 AM      Result Value Range Status   Specimen Description FLUID LEFT PLEURAL COLLECTED BY DOCTOR BOLES   Final   Special Requests IMMUNE:COMPROMISED   Final   ACID FAST SMEAR NO ACID FAST BACILLI  SEEN   Final   Culture     Final   Value: CULTURE WILL BE EXAMINED FOR 6 WEEKS BEFORE ISSUING A FINAL REPORT   Report Status PENDING   Incomplete  BODY FLUID CULTURE     Status: None   Collection Time    06/17/13 11:20 AM      Result Value Range Status   Specimen Description FLUID LEFT PLEURAL COLLECTED BY DOCTOR BOLES   Final   Special Requests IMMUNE:COMPROMISED   Final   Gram Stain     Final   Value: RARE WBC PRESENT, PREDOMINANTLY MONONUCLEAR     NO ORGANISMS SEEN   Culture NO GROWTH 2 DAYS   Final   Report Status PENDING   Incomplete   Medical History: Past Medical History  Diagnosis Date  . COPD (chronic obstructive pulmonary disease)   . AF (atrial fibrillation)   . Hypertension   . Gout   . Arthritis   . Small bowel obstruction   . Renal insufficiency   . Aortic stenosis   . Hydropneumothorax   . Diabetes mellitus   . CHF (congestive heart failure)   . On home O2     qhs prn  . Pneumonia 05/04/2013    history of pneumonia  . Pneumonia 10/12  . Hydropneumothorax 2012   Medications:  Scheduled:  . antiseptic oral rinse  15 mL Mouth Rinse BID  . aspirin  81 mg Oral Daily   . ceFEPime (MAXIPIME) IV  2 g Intravenous Q24H  . dextrose      . diltiazem  360 mg Oral Daily  . feeding supplement  30 mL Oral QHS  . fluticasone  1 puff Inhalation BID  . furosemide  40 mg Intravenous Daily  . guaiFENesin  1,200 mg Oral BID  . insulin aspart  0-20 Units Subcutaneous TID WC  . insulin aspart  0-5 Units Subcutaneous QHS  . insulin glargine  18 Units Subcutaneous Q24H  . levalbuterol  0.63 mg Nebulization QID  . ondansetron (ZOFRAN) IV  4 mg Intravenous q12n4p  . predniSONE  40 mg Oral Q breakfast  . sodium chloride  10-40 mL Intracatheter Q12H  . sodium chloride  3 mL Intravenous Q12H   Assessment: 77 yo M on day#13/14 Cefepime for PNA. Renal function has improved to baseline since discontinuation of Vancomycin.   Goal of Therapy:  Eradicate infection.  Plan:  Increase Cefepime 2gm IV every 12 hours for total 14 days. Monitor renal function and cx data   Elson Clan 06/19/2013,12:13 PM

## 2013-06-19 NOTE — Progress Notes (Signed)
PT Cancellation Note  Patient Details Name: DAEJON LICH MRN: [id number] DOB: 12/23/1936   Cancelled Treatment:    Reason Eval/Treat Not Completed: Fatigue/lethargy limiting ability to participate Pt declines PT.  He states he was up all last night going back and forth to the [facility].  We discussed the fact that he needs to be up OOB tomorrow.  He understands.  Myrlene Broker L 06/19/2013, 10:20 AM

## 2013-06-19 NOTE — Progress Notes (Signed)
Inpatient Diabetes Program Recommendations  AACE/ADA: New Consensus Statement on Inpatient Glycemic Control (2013)  Target Ranges:  Prepandial:   less than 140 mg/dL      Peak postprandial:   less than 180 mg/dL (1-2 hours)      Critically ill patients:  140 - 180 mg/dL     Results for GARRIN, KIRWAN (MRN [id number]) as of 06/19/2013 11:30  Ref. Range 06/18/2013 07:38 06/18/2013 11:28 06/18/2013 16:22 06/18/2013 21:05  Glucose-Capillary Latest Range: 70-99 mg/dL 409 (H) 811 (H) 914 (H) 190 (H)    Results for DEMONTAE, ANTUNES (MRN [id number]) as of 06/19/2013 11:30  Ref. Range 06/19/2013 07:24 06/19/2013 08:29  Glucose-Capillary Latest Range: 70-99 mg/dL 59 (L) 086 (H)    **Noted Solumedrol d/c'd.  Last dose was on 06/18.  Patient now on PO Prednisone.  Having some issues with postprandial CBGs likely due to Prednisone.  **Hypoglycemic this morning- Corrected with D50%  Recommend the following insulin adjustments: 1. Decrease Levemir to 15 units Q24 hours 2. Add Novolog meal coverage- Novolog 3 units tid with meals   Will follow. Ambrose Finland RN, MSN, CDE Diabetes Coordinator Inpatient Diabetes Program [phone]

## 2013-06-19 NOTE — Progress Notes (Signed)
Patients CBG this morning was 59. Started hypoglycemia protocol, gave pt 25 mg of dextrose IV since he is NPO. CBG was 109, did not have to retreat patient.

## 2013-06-19 NOTE — Progress Notes (Signed)
First tap water enema was done this morning at 0600.

## 2013-06-19 NOTE — Op Note (Addendum)
[facility] [address] [place], [address]   COLONOSCOPY PROCEDURE REPORT  PATIENT: [name], [name]  MR#: [id number] BIRTHDATE: 03-Jan-1936 , 77  yrs. old GENDER: Male ENDOSCOPIST: Jonette Eva, MD REFERRED [id number] [name], M.D.  Roetta Sessions, M.D. PROCEDURE DATE:  06/19/2013 PROCEDURE:   Colonoscopy, diagnostic INDICATIONS:anemia, heme pos stools. MEDICATIONS: Demerol 25 mg IV and Versed 3 mg IV  DESCRIPTION OF PROCEDURE:    Physical exam was performed.  Informed consent was obtained from the patient after explaining the benefits, risks, and alternatives to procedure.  The patient was connected to monitor and placed in left lateral position. Continuous oxygen was provided by nasal cannula and IV medicine administered through an indwelling cannula.  After administration of sedation and rectal exam, the patients rectum was intubated and the [id number] ([id number])  colonoscope was advanced under direct visualization to the ileum.  The scope was removed slowly by carefully examining the color, texture, anatomy, and integrity mucosa on the way out.  The patient was recovered in endoscopy and discharged home in satisfactory condition.     COLON FINDINGS: The mucosa appeared normal in the terminal ileum.  10 CM VISUALIZED. Mild diverticulosis was noted in the ascending colon.  , There was severe diverticulosis noted in the descending colon and sigmoid colon with associated muscular hypertrophy and luminal narrowing.  , and Small internal hemorrhoids were found. NO OLD BLOOD OR FRESH BLOOD SEEN IN COLON OR ILEUM.  PREP QUALITY: good.     CECAL W/D TIME: 26 minutes  COMPLICATIONS: None  ENDOSCOPIC IMPRESSION: 1.   Normal mucosa in the terminal ileum 2.   Mild diverticulosis in the ascending colon 3.   Severe diverticulosis in the descending colon and sigmoid colon  4.   Small internal hemorrhoids 5. NO OBVIOUS SOURCE FOR ANEMIA/HEMR POS STOOLS  IDENTIFIED.  RECOMMENDATIONS: PROCEED TO EGD HIGH FIBER DIET NO NEED FOR SCREENING TCS AFTER AGE 65       _______________________________ eSignedJonette Eva, MD 06/19/2013 4:04 PM

## 2013-06-19 NOTE — Progress Notes (Signed)
Patient is NPO, noon CBG was 86, paged Dr [name], was instructed to continue holding Lantus, recheck patients CBG in an hour, if it is below 70 give Dextrose IV. Will continue to monitor this patient.

## 2013-06-19 NOTE — H&P (Signed)
Primary Care Physician:  Fredirick Maudlin, MD Primary Gastroenterologist:  Dr. Darrick Penna  Pre-Procedure History & Physical: HPI:  [name] [name] is a 77 y.o. male here for  ANEMIA/HEME POS STOOLS.   Past Medical History  Diagnosis Date  . COPD (chronic obstructive pulmonary disease)   . AF (atrial fibrillation)   . Hypertension   . Gout   . Arthritis   . Small bowel obstruction   . Renal insufficiency   . Aortic stenosis   . Hydropneumothorax   . Diabetes mellitus   . CHF (congestive heart failure)   . On home O2     qhs prn  . Pneumonia 05/04/2013    history of pneumonia  . Pneumonia 10/12  . Hydropneumothorax 2012    Past Surgical History  Procedure Laterality Date  . Rotator cuff repair      left and right  . Cardiac valve replacement      aortic valve with a tissue prosthesis and left sided maze proc  . S/p rewiring of sternum for dehiscence    . Gallbladder surgery    . US echocardiography  01-03-11    EF 55-60%  . Cardiovascular stress test  01-26-09    EF 67%  . Coronary angioplasty    . Cholecystectomy    . Sternal incision reclosure  08/29/2010    sternal rewiring  . Cataract extraction w/phaco  10/02/2012    Procedure: CATARACT EXTRACTION PHACO AND INTRAOCULAR LENS PLACEMENT (IOC);  Surgeon: Gemma Payor, MD;  Location: [facility];  Service: Ophthalmology;  Laterality: Right;  CDE 16.68  . Cataract extraction w/phaco  10/27/2012    Procedure: CATARACT EXTRACTION PHACO AND INTRAOCULAR LENS PLACEMENT (IOC);  Surgeon: Gemma Payor, MD;  Location: [facility];  Service: Ophthalmology;  Laterality: Left;  CDE:16.89  . Artificial aortic valve      Prior to Admission medications   Medication Sig Start Date End Date Taking? Authorizing Provider  aspirin 81 MG chewable tablet Chew 81 mg by mouth daily.   Yes Historical Provider, MD  diltiazem (CARDIZEM CD) 300 MG 24 hr capsule Take 300 mg by mouth daily.   Yes Historical Provider, MD  feeding supplement (PRO-STAT SUGAR FREE 64)  LIQD Take 30 mLs by mouth at bedtime.   Yes Historical Provider, MD  fluticasone (FLOVENT HFA) 110 MCG/ACT inhaler Inhale 1 puff into the lungs 2 (two) times daily.   Yes Historical Provider, MD  furosemide (LASIX) 80 MG tablet Take 80 mg by mouth daily.   Yes Historical Provider, MD  glyBURIDE (DIABETA) 5 MG tablet Take 1 tablet (5 mg total) by mouth 2 (two) times daily with a meal. 05/26/13  Yes Storm Frisk, MD  insulin aspart (NOVOLOG) 100 UNIT/ML injection Inject 3-15 Units into the skin 3 (three) times daily with meals. Sliding scale as follows: 121-150=3 units 151-200=4 units 201-250=7 units 251-300=9 units 301-350=12 units 351-400=15 units   Yes Historical Provider, MD  insulin glargine (LANTUS) 100 UNIT/ML injection Inject 0.1 mLs (10 Units total) into the skin daily. 05/26/13  Yes Storm Frisk, MD  ipratropium-albuterol (DUONEB) 0.5-2.5 (3) MG/3ML SOLN Take 3 mLs by nebulization 4 (four) times daily.   Yes Historical Provider, MD  magnesium hydroxide (MILK OF MAGNESIA) 400 MG/5ML suspension Take 30 mLs by mouth daily as needed for constipation.   Yes Historical Provider, MD  polyethylene glycol (MIRALAX / GLYCOLAX) packet Take 17 g by mouth daily.   Yes Historical Provider, MD  potassium chloride SA (K-DUR,KLOR-CON) 20 MEQ  tablet Take 20 mEq by mouth daily.   Yes Historical Provider, MD  acetaminophen (TYLENOL) 500 MG tablet Take 1,000 mg by mouth every 6 (six) hours as needed. Pain    Historical Provider, MD  sodium chloride (OCEAN) 0.65 % SOLN nasal spray Place 1 spray into the nose as needed for congestion. 05/26/13   Storm Frisk, MD    Allergies as of 06/07/2013  . (No Known Allergies)    Family History  Problem Relation Age of Onset  . Hypertension Mother   . Stroke Mother   . Heart disease Sister   . Kidney disease Sister     History   Social History  . Marital Status: Married    Spouse Name: N/A    Number of Children: N/A  . Years of Education: N/A    Occupational History  . Not on file.   Social History Main Topics  . Smoking status: Former Smoker    Types: Cigarettes    Quit date: 01/18/2005  . Smokeless tobacco: Never Used  . Alcohol Use: No  . Drug Use: No  . Sexually Active: Yes    Birth Control/ Protection: None   Other Topics Concern  . Not on file   Social History Narrative   [employer] wife in [place]   As a younger man at [employer]    Review of Systems: See HPI, otherwise negative ROS   Physical Exam: BP 129/71  Pulse 84  Temp(Src) 97.9 F (36.6 C) (Oral)  Resp 18  Ht 5\' 6"  (1.676 m)  Wt 175 lb 7.8 oz (79.6 kg)  BMI 28.34 kg/m2  SpO2 93% General:   Alert,  pleasant and cooperative in NAD Head:  Normocephalic and atraumatic. Neck:  Supple; Lungs:  Clear throughout to auscultation.    Heart:  Regular rate and rhythm. Abdomen:  Soft, nontender and nondistended. Normal bowel sounds, without guarding, and without rebound.   Neurologic:  Alert and  oriented x4;  grossly normal neurologically.  Impression/Plan:     HEME POS STOOLS/Anemia  PLAN:  1.TCS/?EGD

## 2013-06-20 DIAGNOSIS — D649 Anemia, unspecified: Secondary | ICD-10-CM

## 2013-06-20 LAB — BODY FLUID CULTURE: Culture: NO GROWTH

## 2013-06-20 LAB — GLUCOSE, CAPILLARY
Glucose-Capillary: 345 mg/dL — ABNORMAL HIGH (ref 70–99)
Glucose-Capillary: 349 mg/dL — ABNORMAL HIGH (ref 70–99)
Glucose-Capillary: 175 mg/dL — ABNORMAL HIGH (ref 70–99)
Glucose-Capillary: 307 mg/dL — ABNORMAL HIGH (ref 70–99)

## 2013-06-20 NOTE — Progress Notes (Signed)
Subjective: He says he feels fairly well. He is still short of breath with exertion but is moving around. He had EGD and colonoscopy yesterday and was found to have peptic ulcer disease. His colon was okay.  Objective: Vital signs in last 24 hours: Temp:  [97.6 F (36.4 C)-98 F (36.7 C)] 97.8 F (36.6 C) (06/21 0534) Pulse Rate:  [79-87] 82 (06/21 0534) Resp:  [7-26] 20 (06/21 0534) BP: (105-137)/(58-81) 136/69 mmHg (06/21 0534) SpO2:  [90 %-97 %] 95 % (06/21 0656) Weight:  [83 kg (182 lb 15.7 oz)] 83 kg (182 lb 15.7 oz) (06/21 0454) Weight change: 3.4 kg (7 lb 7.9 oz) Last BM Date: 06/19/13  Intake/Output from previous day: 06/20 0701 - 06/21 0700 In: 1051.7 [P.O.:240; I.V.:711.7; IV Piggyback:100] Out: 2200 [Urine:2200]  PHYSICAL EXAM General appearance: alert, cooperative and mild distress Resp: rhonchi bilaterally Cardio: irregularly irregular rhythm GI: soft, non-tender; bowel sounds normal; no masses,  no organomegaly Extremities: extremities normal, atraumatic, no cyanosis or edema  Lab Results:    Basic Metabolic Panel:  Recent Labs  16/10/96 0831 06/19/13 0507  NA 138 137  K 4.6 3.6  CL 95* 92*  CO2 41* 40*  GLUCOSE 217* 67*  BUN 73* 62*  CREATININE 1.29 1.11  CALCIUM 8.9 8.6   Liver Function Tests: No results found for this basename: AST, ALT, ALKPHOS, BILITOT, PROT, ALBUMIN,  in the last 72 hours No results found for this basename: LIPASE, AMYLASE,  in the last 72 hours No results found for this basename: AMMONIA,  in the last 72 hours CBC:  Recent Labs  06/19/13 0507  WBC 16.3*  NEUTROABS 12.4*  HGB 9.5*  HCT 30.1*  MCV 94.4  PLT 153   Cardiac Enzymes: No results found for this basename: CKTOTAL, CKMB, CKMBINDEX, TROPONINI,  in the last 72 hours BNP: No results found for this basename: PROBNP,  in the last 72 hours D-Dimer: No results found for this basename: DDIMER,  in the last 72 hours CBG:  Recent Labs  06/19/13 1122  06/19/13 1318 06/19/13 1425 06/19/13 1805 06/19/13 2154 06/20/13 0711  GLUCAP 86 110* 121* 123* 345* 263*   Hemoglobin A1C: No results found for this basename: HGBA1C,  in the last 72 hours Fasting Lipid Panel: No results found for this basename: CHOL, HDL, LDLCALC, TRIG, CHOLHDL, LDLDIRECT,  in the last 72 hours Thyroid Function Tests: No results found for this basename: TSH, T4TOTAL, FREET4, T3FREE, THYROIDAB,  in the last 72 hours Anemia Panel: No results found for this basename: VITAMINB12, FOLATE, FERRITIN, TIBC, IRON, RETICCTPCT,  in the last 72 hours Coagulation: No results found for this basename: LABPROT, INR,  in the last 72 hours Urine Drug Screen: Drugs of Abuse  No results found for this basename: labopia, cocainscrnur, labbenz, amphetmu, thcu, labbarb    Alcohol Level: No results found for this basename: ETH,  in the last 72 hours Urinalysis: No results found for this basename: COLORURINE, APPERANCEUR, LABSPEC, PHURINE, GLUCOSEU, HGBUR, BILIRUBINUR, KETONESUR, PROTEINUR, UROBILINOGEN, NITRITE, LEUKOCYTESUR,  in the last 72 hours Misc. Labs:  ABGS No results found for this basename: PHART, PCO2, PO2ART, TCO2, HCO3,  in the last 72 hours CULTURES Recent Results (from the past 240 hour(s))  AFB CULTURE WITH SMEAR     Status: None   Collection Time    06/17/13 11:20 AM      Result Value Range Status   Specimen Description FLUID LEFT PLEURAL COLLECTED BY DOCTOR BOLES   Final   Special  Requests IMMUNE:COMPROMISED   Final   ACID FAST SMEAR NO ACID FAST BACILLI SEEN   Final   Culture     Final   Value: CULTURE WILL BE EXAMINED FOR 6 WEEKS BEFORE ISSUING A FINAL REPORT   Report Status PENDING   Incomplete  BODY FLUID CULTURE     Status: None   Collection Time    06/17/13 11:20 AM      Result Value Range Status   Specimen Description FLUID LEFT PLEURAL COLLECTED BY DOCTOR BOLES   Final   Special Requests IMMUNE:COMPROMISED   Final   Gram Stain     Final   Value:  RARE WBC PRESENT, PREDOMINANTLY MONONUCLEAR     NO ORGANISMS SEEN   Culture NO GROWTH 2 DAYS   Final   Report Status PENDING   Incomplete   Studies/Results: No results found.  Medications:  Prior to Admission:  Prescriptions prior to admission  Medication Sig Dispense Refill  . aspirin 81 MG chewable tablet Chew 81 mg by mouth daily.      Marland Kitchen diltiazem (CARDIZEM CD) 300 MG 24 hr capsule Take 300 mg by mouth daily.      . feeding supplement (PRO-STAT SUGAR FREE 64) LIQD Take 30 mLs by mouth at bedtime.      . fluticasone (FLOVENT HFA) 110 MCG/ACT inhaler Inhale 1 puff into the lungs 2 (two) times daily.      . furosemide (LASIX) 80 MG tablet Take 80 mg by mouth daily.      Marland Kitchen glyBURIDE (DIABETA) 5 MG tablet Take 1 tablet (5 mg total) by mouth 2 (two) times daily with a meal.      . insulin aspart (NOVOLOG) 100 UNIT/ML injection Inject 3-15 Units into the skin 3 (three) times daily with meals. Sliding scale as follows: 121-150=3 units 151-200=4 units 201-250=7 units 251-300=9 units 301-350=12 units 351-400=15 units      . insulin glargine (LANTUS) 100 UNIT/ML injection Inject 0.1 mLs (10 Units total) into the skin daily.  10 mL  12  . ipratropium-albuterol (DUONEB) 0.5-2.5 (3) MG/3ML SOLN Take 3 mLs by nebulization 4 (four) times daily.      . magnesium hydroxide (MILK OF MAGNESIA) 400 MG/5ML suspension Take 30 mLs by mouth daily as needed for constipation.      . polyethylene glycol (MIRALAX / GLYCOLAX) packet Take 17 g by mouth daily.      . potassium chloride SA (K-DUR,KLOR-CON) 20 MEQ tablet Take 20 mEq by mouth daily.      Marland Kitchen acetaminophen (TYLENOL) 500 MG tablet Take 1,000 mg by mouth every 6 (six) hours as needed. Pain      . sodium chloride (OCEAN) 0.65 % SOLN nasal spray Place 1 spray into the nose as needed for congestion.    0   Scheduled: . antiseptic oral rinse  15 mL Mouth Rinse BID  . ceFEPime (MAXIPIME) IV  2 g Intravenous Q12H  . diltiazem  360 mg Oral Daily  . feeding  supplement  30 mL Oral QHS  . fluticasone  1 puff Inhalation BID  . furosemide  40 mg Intravenous Daily  . guaiFENesin  1,200 mg Oral BID  . insulin aspart  0-20 Units Subcutaneous TID WC  . insulin aspart  0-5 Units Subcutaneous QHS  . insulin glargine  18 Units Subcutaneous Q24H  . levalbuterol  0.63 mg Nebulization QID  . ondansetron (ZOFRAN) IV  4 mg Intravenous q12n4p  . pantoprazole  40 mg Oral BID AC  .  predniSONE  40 mg Oral Q breakfast  . sodium chloride  10-40 mL Intracatheter Q12H  . sodium chloride  3 mL Intravenous Q12H   Continuous:  ZOX:WRUEAV chloride, acetaminophen, levalbuterol, sodium chloride, sodium chloride  Assesment: He has peptic ulcer disease which is being treated. He has chronic blood loss anemia. He has healthcare associated pneumonia and tomorrow will be his last day of treatment for that. He has atrial flutter fibrillation with well-controlled ventricular response. He has acute on chronic renal failure which had improved. He has acute on chronic diastolic congestive heart failure which has improved. Principal Problem:   HCAP (healthcare-associated pneumonia) Active Problems:   Atrial flutter   COPD (chronic obstructive pulmonary disease)   Hypertension   Type 2 diabetes mellitus, uncontrolled   Acute-on-chronic respiratory failure   Acute on chronic diastolic CHF (congestive heart failure), NYHA class 1   Anemia, normocytic normochromic   Acute on chronic renal failure   Pleural effusion    Plan: Continue current treatments. He will have repeat laboratory work tomorrow    LOS: 13 days   Samya Siciliano L 06/20/2013, 10:37 AM

## 2013-06-20 NOTE — Progress Notes (Addendum)
Subjective: Since I last evaluated the patient HE IS TOLERATING POS. NO QUESTIONS OR CONCERNS. PASSING GAS. NO BM.  Objective: Vital signs in last 24 hours: Temp:  [97.6 F (36.4 C)-98 F (36.7 C)] 97.8 F (36.6 C) (06/21 0534) Pulse Rate:  [79-87] 82 (06/21 0534) Resp:  [7-26] 20 (06/21 0534) BP: (105-137)/(58-81) 136/69 mmHg (06/21 0534) SpO2:  [90 %-97 %] 95 % (06/21 0656) Weight:  [182 lb 15.7 oz (83 kg)] 182 lb 15.7 oz (83 kg) (06/21 0454) Last BM Date: 06/19/13  Intake/Output from previous day: 06/20 0701 - 06/21 0700 In: 1051.7 [P.O.:240; I.V.:711.7; IV Piggyback:100] Out: 2200 [Urine:2200] Intake/Output this shift: Total I/O In: 50 [IV Piggyback:50] Out: 600 [Urine:600]  General appearance: alert, cooperative and no distress LUNGS: FAIR AIR MOVEMENT BILATERALLY HEART: IRREGULAR RHYTHM ABDOMEN: BS PRESENT, NON-TENDER, MILDLY DISTENDED  Lab Results:  Recent Labs  06/19/13 0507  WBC 16.3*  HGB 9.5*  HCT 30.1*  PLT 153   BMET  Recent Labs  06/17/13 1220 06/18/13 0831 06/19/13 0507  NA  --  138 137  K  --  4.6 3.6  CL  --  95* 92*  CO2  --  41* 40*  GLUCOSE 544* 217* 67*  BUN  --  73* 62*  CREATININE  --  1.29 1.11  CALCIUM  --  8.9 8.6   LFT No results found for this basename: PROT, ALBUMIN, AST, ALT, ALKPHOS, BILITOT, BILIDIR, IBILI,  in the last 72 hours PT/INR No results found for this basename: LABPROT, INR,  in the last 72 hours Hepatitis Panel No results found for this basename: HEPBSAG, HCVAB, HEPAIGM, HEPBIGM,  in the last 72 hours C-Diff No results found for this basename: CDIFFTOX,  in the last 72 hours Fecal Lactopherrin No results found for this basename: FECLLACTOFRN,  in the last 72 hours  Studies/Results: No results found.  Medications: I have reviewed the patient's current medications.  Assessment/Plan: ADMITTED WITH HEM POS STOOL, ANEMIA, & COFFEE GROUND EMESIS DUE TO PUD/GASTRITIS. EGD YESTERDAY ALSO SHOWED PATENT  ESOPHAGEAL STRICTURE.  PLAN: 1. BID PPI FOR 3 MOS THEN ONCE DAILY 2. AWAIT BIOPSY 3. LOW FAT SOFT MECHANICAL DIET 4. OPV W/ RMR IN 2 MOS 5. WILL SIGN OFF. CALL WITH QUESTIONS.  LOS: 13 days   Mieshia Pepitone 06/20/2013, 11:54 AM

## 2013-06-21 LAB — CBC WITH DIFFERENTIAL/PLATELET
Blasts: 0 %
MCH: 29.2 pg (ref 26.0–34.0)
MCV: 94.7 fL (ref 78.0–100.0)
Metamyelocytes Relative: 0 %
Myelocytes: 0 %
Platelets: 144 10*3/uL — ABNORMAL LOW (ref 150–400)
Promyelocytes Absolute: 0 %
RDW: 19.5 % — ABNORMAL HIGH (ref 11.5–15.5)
nRBC: 0 /100 WBC

## 2013-06-21 LAB — BASIC METABOLIC PANEL
CO2: 39 mEq/L — ABNORMAL HIGH (ref 19–32)
Calcium: 8.3 mg/dL — ABNORMAL LOW (ref 8.4–10.5)
Creatinine, Ser: 1.34 mg/dL (ref 0.50–1.35)
Glucose, Bld: 219 mg/dL — ABNORMAL HIGH (ref 70–99)

## 2013-06-21 LAB — GLUCOSE, CAPILLARY: Glucose-Capillary: 271 mg/dL — ABNORMAL HIGH (ref 70–99)

## 2013-06-21 MED ORDER — PREDNISONE 20 MG PO TABS
30.0000 mg | ORAL_TABLET | Freq: Every day | ORAL | Status: DC
  Administered 2013-06-22: 30 mg via ORAL
  Filled 2013-06-21: qty 1

## 2013-06-21 NOTE — Progress Notes (Signed)
Discussed insulin administration and demonstrated to patient and his wife at lunchtime.Patient administered insulin to himself at dinner, using correct technique.  Also gave pt's wife education handouts on diabetic diet.

## 2013-06-21 NOTE — Progress Notes (Signed)
Subjective: He is generally better. He is moving around some in the room. He will finish his IV antibiotics today.  Objective: Vital signs in last 24 hours: Temp:  [97.4 F (36.3 C)-97.9 F (36.6 C)] 97.4 F (36.3 C) (06/22 0500) Pulse Rate:  [78-98] 78 (06/22 0500) Resp:  [18-20] 20 (06/22 0500) BP: (134)/(55-73) 134/56 mmHg (06/22 0500) SpO2:  [94 %-100 %] 95 % (06/22 0758) Weight:  [80.2 kg (176 lb 12.9 oz)] 80.2 kg (176 lb 12.9 oz) (06/22 0500) Weight change: -2.8 kg (-6 lb 2.8 oz) Last BM Date: 06/20/13  Intake/Output from previous day: 06/21 0701 - 06/22 0700 In: 1496.7 [P.O.:1200; I.V.:246.7; IV Piggyback:50] Out: 1750 [Urine:1750]  PHYSICAL EXAM General appearance: alert, cooperative and mild distress Resp: clear to auscultation bilaterally Cardio: irregularly irregular rhythm GI: soft, non-tender; bowel sounds normal; no masses,  no organomegaly Extremities: extremities normal, atraumatic, no cyanosis or edema  Lab Results:    Basic Metabolic Panel:  Recent Labs  16/10/96 0507 06/21/13 0519  NA 137 136  K 3.6 3.7  CL 92* 93*  CO2 40* 39*  GLUCOSE 67* 219*  BUN 62* 61*  CREATININE 1.11 1.34  CALCIUM 8.6 8.3*   Liver Function Tests: No results found for this basename: AST, ALT, ALKPHOS, BILITOT, PROT, ALBUMIN,  in the last 72 hours No results found for this basename: LIPASE, AMYLASE,  in the last 72 hours No results found for this basename: AMMONIA,  in the last 72 hours CBC:  Recent Labs  06/19/13 0507 06/21/13 0519  WBC 16.3* 16.2*  NEUTROABS 12.4* 12.8*  HGB 9.5* 8.3*  HCT 30.1* 26.9*  MCV 94.4 94.7  PLT 153 144*   Cardiac Enzymes: No results found for this basename: CKTOTAL, CKMB, CKMBINDEX, TROPONINI,  in the last 72 hours BNP: No results found for this basename: PROBNP,  in the last 72 hours D-Dimer: No results found for this basename: DDIMER,  in the last 72 hours CBG:  Recent Labs  06/19/13 1805 06/19/13 2154 06/20/13 0711  06/20/13 1112 06/20/13 1626 06/20/13 2106  GLUCAP 123* 345* 263* 349* 175* 307*   Hemoglobin A1C: No results found for this basename: HGBA1C,  in the last 72 hours Fasting Lipid Panel: No results found for this basename: CHOL, HDL, LDLCALC, TRIG, CHOLHDL, LDLDIRECT,  in the last 72 hours Thyroid Function Tests: No results found for this basename: TSH, T4TOTAL, FREET4, T3FREE, THYROIDAB,  in the last 72 hours Anemia Panel: No results found for this basename: VITAMINB12, FOLATE, FERRITIN, TIBC, IRON, RETICCTPCT,  in the last 72 hours Coagulation: No results found for this basename: LABPROT, INR,  in the last 72 hours Urine Drug Screen: Drugs of Abuse  No results found for this basename: labopia, cocainscrnur, labbenz, amphetmu, thcu, labbarb    Alcohol Level: No results found for this basename: ETH,  in the last 72 hours Urinalysis: No results found for this basename: COLORURINE, APPERANCEUR, LABSPEC, PHURINE, GLUCOSEU, HGBUR, BILIRUBINUR, KETONESUR, PROTEINUR, UROBILINOGEN, NITRITE, LEUKOCYTESUR,  in the last 72 hours Misc. Labs:  ABGS No results found for this basename: PHART, PCO2, PO2ART, TCO2, HCO3,  in the last 72 hours CULTURES Recent Results (from the past 240 hour(s))  AFB CULTURE WITH SMEAR     Status: None   Collection Time    06/17/13 11:20 AM      Result Value Range Status   Specimen Description FLUID LEFT PLEURAL COLLECTED BY DOCTOR BOLES   Final   Special Requests IMMUNE:COMPROMISED   Final  ACID FAST SMEAR NO ACID FAST BACILLI SEEN   Final   Culture     Final   Value: CULTURE WILL BE EXAMINED FOR 6 WEEKS BEFORE ISSUING A FINAL REPORT   Report Status PENDING   Incomplete  BODY FLUID CULTURE     Status: None   Collection Time    06/17/13 11:20 AM      Result Value Range Status   Specimen Description FLUID LEFT PLEURAL COLLECTED BY DOCTOR BOLES   Final   Special Requests IMMUNE:COMPROMISED   Final   Gram Stain     Final   Value: RARE WBC PRESENT,  PREDOMINANTLY MONONUCLEAR     NO ORGANISMS SEEN   Culture NO GROWTH 3 DAYS   Final   Report Status 06/20/2013 FINAL   Final   Studies/Results: No results found.  Medications:  Prior to Admission:  Prescriptions prior to admission  Medication Sig Dispense Refill  . aspirin 81 MG chewable tablet Chew 81 mg by mouth daily.      Marland Kitchen diltiazem (CARDIZEM CD) 300 MG 24 hr capsule Take 300 mg by mouth daily.      . feeding supplement (PRO-STAT SUGAR FREE 64) LIQD Take 30 mLs by mouth at bedtime.      . fluticasone (FLOVENT HFA) 110 MCG/ACT inhaler Inhale 1 puff into the lungs 2 (two) times daily.      . furosemide (LASIX) 80 MG tablet Take 80 mg by mouth daily.      Marland Kitchen glyBURIDE (DIABETA) 5 MG tablet Take 1 tablet (5 mg total) by mouth 2 (two) times daily with a meal.      . insulin aspart (NOVOLOG) 100 UNIT/ML injection Inject 3-15 Units into the skin 3 (three) times daily with meals. Sliding scale as follows: 121-150=3 units 151-200=4 units 201-250=7 units 251-300=9 units 301-350=12 units 351-400=15 units      . insulin glargine (LANTUS) 100 UNIT/ML injection Inject 0.1 mLs (10 Units total) into the skin daily.  10 mL  12  . ipratropium-albuterol (DUONEB) 0.5-2.5 (3) MG/3ML SOLN Take 3 mLs by nebulization 4 (four) times daily.      . magnesium hydroxide (MILK OF MAGNESIA) 400 MG/5ML suspension Take 30 mLs by mouth daily as needed for constipation.      . polyethylene glycol (MIRALAX / GLYCOLAX) packet Take 17 g by mouth daily.      . potassium chloride SA (K-DUR,KLOR-CON) 20 MEQ tablet Take 20 mEq by mouth daily.      Marland Kitchen acetaminophen (TYLENOL) 500 MG tablet Take 1,000 mg by mouth every 6 (six) hours as needed. Pain      . sodium chloride (OCEAN) 0.65 % SOLN nasal spray Place 1 spray into the nose as needed for congestion.    0   Scheduled: . antiseptic oral rinse  15 mL Mouth Rinse BID  . diltiazem  360 mg Oral Daily  . feeding supplement  30 mL Oral QHS  . fluticasone  1 puff Inhalation  BID  . furosemide  40 mg Intravenous Daily  . guaiFENesin  1,200 mg Oral BID  . insulin aspart  0-20 Units Subcutaneous TID WC  . insulin aspart  0-5 Units Subcutaneous QHS  . insulin glargine  18 Units Subcutaneous Q24H  . levalbuterol  0.63 mg Nebulization QID  . ondansetron (ZOFRAN) IV  4 mg Intravenous q12n4p  . pantoprazole  40 mg Oral BID AC  . predniSONE  40 mg Oral Q breakfast  . sodium chloride  10-40 mL Intracatheter  Q12H  . sodium chloride  3 mL Intravenous Q12H   Continuous:  FAO:ZHYQMV chloride, acetaminophen, levalbuterol, sodium chloride, sodium chloride  Assesment: He was admitted with healthcare pneumonia. This has been complicated by multiple other medical problems including acute on chronic diastolic congestive heart failure. He's had acute on chronic respiratory failure. He had a large pleural effusion and this was tapped. He has atrial flutter and has had rapid ventricular response but that is better. He has type 2 diabetes mostly from his medications and acute illness. He had acute on chronic renal failure. He was anemic which appears to be from chronic blood loss and has peptic ulcer disease. Principal Problem:   HCAP (healthcare-associated pneumonia) Active Problems:   Atrial flutter   COPD (chronic obstructive pulmonary disease)   Hypertension   Type 2 diabetes mellitus, uncontrolled   Acute-on-chronic respiratory failure   Acute on chronic diastolic CHF (congestive heart failure), NYHA class 1   Anemia, normocytic normochromic   Acute on chronic renal failure   Pleural effusion    Plan: He should finish his IV antibiotics today. I am trying to get him set for discharge tomorrow. I want him to be more active and up and around today. I'm going to take him off his current prednisone dose and reduce the dose    LOS: 14 days   Vianey Caniglia L 06/21/2013, 10:02 AM

## 2013-06-22 ENCOUNTER — Encounter (HOSPITAL_COMMUNITY): Payer: Self-pay | Admitting: Gastroenterology

## 2013-06-22 LAB — BASIC METABOLIC PANEL
CO2: 41 mEq/L (ref 19–32)
Calcium: 8.6 mg/dL (ref 8.4–10.5)
Glucose, Bld: 192 mg/dL — ABNORMAL HIGH (ref 70–99)
Sodium: 139 mEq/L (ref 135–145)

## 2013-06-22 LAB — GLUCOSE, CAPILLARY: Glucose-Capillary: 149 mg/dL — ABNORMAL HIGH (ref 70–99)

## 2013-06-22 MED ORDER — FUROSEMIDE 40 MG PO TABS: 40.0000 mg | ORAL_TABLET | Freq: Every day | ORAL | Status: DC

## 2013-06-22 MED ORDER — PREDNISONE 10 MG PO TABS: 10.0000 mg | ORAL_TABLET | Freq: Every day | ORAL | Status: DC

## 2013-06-22 MED ORDER — DILTIAZEM HCL ER COATED BEADS 360 MG PO CP24: 360.0000 mg | ORAL_CAPSULE | Freq: Every day | ORAL | Status: DC

## 2013-06-22 MED ORDER — PANTOPRAZOLE SODIUM 40 MG PO TBEC: 40.0000 mg | DELAYED_RELEASE_TABLET | Freq: Two times a day (BID) | ORAL | Status: AC

## 2013-06-22 NOTE — Progress Notes (Signed)
Ambulated patient 50 feet with front wheel walker, two person assist for safety, and oxygen in place per MD order.  Patient tolerated well but did feel "winded" at the end of the walk.  Placed patient on side of bed.  Oxygen saturation checked and found to be 82% on 4 lpm.  Encouraged patient to take slow, deep breathes.  Patient rebounded to 94% on 4 lpm.  Patient stated he felt "better now" that he was resting. Will continue to monitor.    Schonewitz, Candelaria Stagers  06/22/2013

## 2013-06-22 NOTE — Progress Notes (Signed)
Patient's resting oxygen saturation was 85% on room air after two minutes without oxygen. Reapplied oxygen at 4lpm as currently ordered.  Patient rebounded after three minutes to 96%.  Patient resting well in bed at this time.  Will continue to monitor. Schonewitz, Candelaria Stagers 06/22/2013

## 2013-06-22 NOTE — Progress Notes (Signed)
D/c instructions reviewed with patient, wife, and daughter.  Verbalized understanding after asking appropriate questions.  Pt dc'd to home with family. Schonewitz, Candelaria Stagers 06/22/2013

## 2013-06-22 NOTE — Progress Notes (Signed)
Subjective: He feels better. He was able to ambulate short distances. He was able to give himself an insulin shot.  Objective: Vital signs in last 24 hours: Temp:  [97.4 F (36.3 C)-98.3 F (36.8 C)] 97.5 F (36.4 C) (06/23 0429) Pulse Rate:  [78-92] 78 (06/23 0429) Resp:  [20] 20 (06/23 0429) BP: (132-135)/(57-66) 135/66 mmHg (06/23 0429) SpO2:  [87 %-100 %] 93 % (06/23 0720) Weight:  [82.918 kg (182 lb 12.8 oz)] 82.918 kg (182 lb 12.8 oz) (06/23 0429) Weight change: 2.718 kg (5 lb 15.9 oz) Last BM Date: 06/20/13  Intake/Output from previous day: 06/22 0701 - 06/23 0700 In: 1439.8 [P.O.:1200; I.V.:239.8] Out: 2325 [Urine:2325]  PHYSICAL EXAM General appearance: alert, cooperative and mild distress Resp: rhonchi bilaterally Cardio: irregularly irregular rhythm GI: soft, non-tender; bowel sounds normal; no masses,  no organomegaly Extremities: extremities normal, atraumatic, no cyanosis or edema  Lab Results:    Basic Metabolic Panel:  Recent Labs  16/10/96 0519 06/22/13 0601  NA 136 139  K 3.7 4.0  CL 93* 97  CO2 39* 41*  GLUCOSE 219* 192*  BUN 61* 58*  CREATININE 1.34 1.37*  CALCIUM 8.3* 8.6   Liver Function Tests: No results found for this basename: AST, ALT, ALKPHOS, BILITOT, PROT, ALBUMIN,  in the last 72 hours No results found for this basename: LIPASE, AMYLASE,  in the last 72 hours No results found for this basename: AMMONIA,  in the last 72 hours CBC:  Recent Labs  06/21/13 0519  WBC 16.2*  NEUTROABS 12.8*  HGB 8.3*  HCT 26.9*  MCV 94.7  PLT 144*   Cardiac Enzymes: No results found for this basename: CKTOTAL, CKMB, CKMBINDEX, TROPONINI,  in the last 72 hours BNP: No results found for this basename: PROBNP,  in the last 72 hours D-Dimer: No results found for this basename: DDIMER,  in the last 72 hours CBG:  Recent Labs  06/20/13 1112 06/20/13 1626 06/20/13 2106 06/21/13 1617 06/21/13 2022 06/22/13 0729  GLUCAP 349* 175* 307* 271*  271* 149*   Hemoglobin A1C: No results found for this basename: HGBA1C,  in the last 72 hours Fasting Lipid Panel: No results found for this basename: CHOL, HDL, LDLCALC, TRIG, CHOLHDL, LDLDIRECT,  in the last 72 hours Thyroid Function Tests: No results found for this basename: TSH, T4TOTAL, FREET4, T3FREE, THYROIDAB,  in the last 72 hours Anemia Panel: No results found for this basename: VITAMINB12, FOLATE, FERRITIN, TIBC, IRON, RETICCTPCT,  in the last 72 hours Coagulation: No results found for this basename: LABPROT, INR,  in the last 72 hours Urine Drug Screen: Drugs of Abuse  No results found for this basename: labopia, cocainscrnur, labbenz, amphetmu, thcu, labbarb    Alcohol Level: No results found for this basename: ETH,  in the last 72 hours Urinalysis: No results found for this basename: COLORURINE, APPERANCEUR, LABSPEC, PHURINE, GLUCOSEU, HGBUR, BILIRUBINUR, KETONESUR, PROTEINUR, UROBILINOGEN, NITRITE, LEUKOCYTESUR,  in the last 72 hours Misc. Labs:  ABGS No results found for this basename: PHART, PCO2, PO2ART, TCO2, HCO3,  in the last 72 hours CULTURES Recent Results (from the past 240 hour(s))  AFB CULTURE WITH SMEAR     Status: None   Collection Time    06/17/13 11:20 AM      Result Value Range Status   Specimen Description FLUID LEFT PLEURAL COLLECTED BY DOCTOR BOLES   Final   Special Requests IMMUNE:COMPROMISED   Final   ACID FAST SMEAR NO ACID FAST BACILLI SEEN   Final  Culture     Final   Value: CULTURE WILL BE EXAMINED FOR 6 WEEKS BEFORE ISSUING A FINAL REPORT   Report Status PENDING   Incomplete  BODY FLUID CULTURE     Status: None   Collection Time    06/17/13 11:20 AM      Result Value Range Status   Specimen Description FLUID LEFT PLEURAL COLLECTED BY DOCTOR BOLES   Final   Special Requests IMMUNE:COMPROMISED   Final   Gram Stain     Final   Value: RARE WBC PRESENT, PREDOMINANTLY MONONUCLEAR     NO ORGANISMS SEEN   Culture NO GROWTH 3 DAYS   Final    Report Status 06/20/2013 FINAL   Final   Studies/Results: No results found.  Medications:  Prior to Admission:  Prescriptions prior to admission  Medication Sig Dispense Refill  . aspirin 81 MG chewable tablet Chew 81 mg by mouth daily.      Marland Kitchen diltiazem (CARDIZEM CD) 300 MG 24 hr capsule Take 300 mg by mouth daily.      . feeding supplement (PRO-STAT SUGAR FREE 64) LIQD Take 30 mLs by mouth at bedtime.      . fluticasone (FLOVENT HFA) 110 MCG/ACT inhaler Inhale 1 puff into the lungs 2 (two) times daily.      . furosemide (LASIX) 80 MG tablet Take 80 mg by mouth daily.      Marland Kitchen glyBURIDE (DIABETA) 5 MG tablet Take 1 tablet (5 mg total) by mouth 2 (two) times daily with a meal.      . insulin aspart (NOVOLOG) 100 UNIT/ML injection Inject 3-15 Units into the skin 3 (three) times daily with meals. Sliding scale as follows: 121-150=3 units 151-200=4 units 201-250=7 units 251-300=9 units 301-350=12 units 351-400=15 units      . insulin glargine (LANTUS) 100 UNIT/ML injection Inject 0.1 mLs (10 Units total) into the skin daily.  10 mL  12  . ipratropium-albuterol (DUONEB) 0.5-2.5 (3) MG/3ML SOLN Take 3 mLs by nebulization 4 (four) times daily.      . magnesium hydroxide (MILK OF MAGNESIA) 400 MG/5ML suspension Take 30 mLs by mouth daily as needed for constipation.      . polyethylene glycol (MIRALAX / GLYCOLAX) packet Take 17 g by mouth daily.      . potassium chloride SA (K-DUR,KLOR-CON) 20 MEQ tablet Take 20 mEq by mouth daily.      Marland Kitchen acetaminophen (TYLENOL) 500 MG tablet Take 1,000 mg by mouth every 6 (six) hours as needed. Pain      . sodium chloride (OCEAN) 0.65 % SOLN nasal spray Place 1 spray into the nose as needed for congestion.    0   Scheduled: . antiseptic oral rinse  15 mL Mouth Rinse BID  . diltiazem  360 mg Oral Daily  . feeding supplement  30 mL Oral QHS  . fluticasone  1 puff Inhalation BID  . furosemide  40 mg Intravenous Daily  . guaiFENesin  1,200 mg Oral BID  .  insulin aspart  0-20 Units Subcutaneous TID WC  . insulin aspart  0-5 Units Subcutaneous QHS  . insulin glargine  18 Units Subcutaneous Q24H  . levalbuterol  0.63 mg Nebulization QID  . ondansetron (ZOFRAN) IV  4 mg Intravenous q12n4p  . pantoprazole  40 mg Oral BID AC  . predniSONE  30 mg Oral Q breakfast  . sodium chloride  10-40 mL Intracatheter Q12H  . sodium chloride  3 mL Intravenous Q12H   Continuous:  YNW:GNFAOZ chloride, acetaminophen, levalbuterol, sodium chloride, sodium chloride  Assesment: He was admitted with healthcare associated pneumonia. He had acute on chronic respiratory failure with this. This was complicated by acute on chronic diastolic congestive heart failure. He had acute on chronic renal failure which is stable. He had a pleural effusion and had thoracentesis. He has had a prolonged hospitalization previously been was in a nursing home and had prolonged hospitalization here but I think he is ready for discharge with maximum home health care he Principal Problem:   HCAP (healthcare-associated pneumonia) Active Problems:   Atrial flutter   COPD (chronic obstructive pulmonary disease)   Hypertension   Type 2 diabetes mellitus, uncontrolled   Acute-on-chronic respiratory failure   Acute on chronic diastolic CHF (congestive heart failure), NYHA class 1   Anemia, normocytic normochromic   Acute on chronic renal failure   Pleural effusion    Plan: Discharge home today.    LOS: 15 days   Garik Diamant L 06/22/2013, 8:44 AM

## 2013-06-22 NOTE — Progress Notes (Signed)
  RD consulted for nutrition education regarding elevated glucose due to steriods.   Lab Results  Component Value Date   HGBA1C 5.8* 04/20/2013    RD provided "Carbohydrate Counting for People with Diabetes" handout from the Academy of [employer] and Dietetics. Discussed different food groups and their effects on blood sugar, emphasizing carbohydrate-containing foods. Provided list of carbohydrates and recommended serving sizes of common foods.  Discussed importance of controlled and consistent carbohydrate intake throughout the day. Provided examples of ways to balance meals/snacks and encouraged intake of high-fiber, whole grain complex carbohydrates. Teach back method used.  Expect very good compliance.  Body mass index is 29.52 kg/(m^2). Pt meets criteria for overweight based on current BMI.  Current diet order is CHO Modified/Mechanical Soft (1600-2000 kcal), patient is consuming approximately 100% of meals at this time. Labs and medications reviewed. No further nutrition interventions warranted at this time. RD contact information provided. If additional nutrition issues arise, please re-consult RD.  Royann Shivers MS,RD,LDN,CSG Office: [phone] Pager: [phone].

## 2013-06-23 LAB — GLUCOSE, CAPILLARY: Glucose-Capillary: 177 mg/dL — ABNORMAL HIGH (ref 70–99)

## 2013-06-25 ENCOUNTER — Encounter (HOSPITAL_COMMUNITY): Payer: Self-pay | Admitting: *Deleted

## 2013-06-25 ENCOUNTER — Inpatient Hospital Stay (HOSPITAL_COMMUNITY)
Admission: EM | Admit: 2013-06-25 | Discharge: 2013-07-02 | DRG: 291 | Disposition: A | Discharge status: Home Health | Payer: Medicare Other | Attending: Pulmonary Disease | Admitting: Pulmonary Disease

## 2013-06-25 ENCOUNTER — Emergency Department (HOSPITAL_COMMUNITY): Payer: Medicare Other

## 2013-06-25 DIAGNOSIS — I5033 Acute on chronic diastolic (congestive) heart failure: Principal | ICD-10-CM

## 2013-06-25 DIAGNOSIS — M129 Arthropathy, unspecified: Secondary | ICD-10-CM | POA: Diagnosis present

## 2013-06-25 DIAGNOSIS — J4489 Other specified chronic obstructive pulmonary disease: Secondary | ICD-10-CM | POA: Diagnosis present

## 2013-06-25 DIAGNOSIS — R627 Adult failure to thrive: Secondary | ICD-10-CM | POA: Diagnosis present

## 2013-06-25 DIAGNOSIS — IMO0002 Reserved for concepts with insufficient information to code with codable children: Secondary | ICD-10-CM

## 2013-06-25 DIAGNOSIS — Z87891 Personal history of nicotine dependence: Secondary | ICD-10-CM

## 2013-06-25 DIAGNOSIS — D649 Anemia, unspecified: Secondary | ICD-10-CM | POA: Diagnosis present

## 2013-06-25 DIAGNOSIS — I129 Hypertensive chronic kidney disease with stage 1 through stage 4 chronic kidney disease, or unspecified chronic kidney disease: Secondary | ICD-10-CM | POA: Diagnosis present

## 2013-06-25 DIAGNOSIS — Z9981 Dependence on supplemental oxygen: Secondary | ICD-10-CM

## 2013-06-25 DIAGNOSIS — K922 Gastrointestinal hemorrhage, unspecified: Secondary | ICD-10-CM

## 2013-06-25 DIAGNOSIS — K921 Melena: Secondary | ICD-10-CM

## 2013-06-25 DIAGNOSIS — I4892 Unspecified atrial flutter: Secondary | ICD-10-CM | POA: Diagnosis present

## 2013-06-25 DIAGNOSIS — I509 Heart failure, unspecified: Secondary | ICD-10-CM | POA: Diagnosis present

## 2013-06-25 DIAGNOSIS — M109 Gout, unspecified: Secondary | ICD-10-CM | POA: Diagnosis present

## 2013-06-25 DIAGNOSIS — R0902 Hypoxemia: Secondary | ICD-10-CM | POA: Diagnosis present

## 2013-06-25 DIAGNOSIS — Z8701 Personal history of pneumonia (recurrent): Secondary | ICD-10-CM

## 2013-06-25 DIAGNOSIS — I4891 Unspecified atrial fibrillation: Secondary | ICD-10-CM | POA: Diagnosis present

## 2013-06-25 DIAGNOSIS — E1165 Type 2 diabetes mellitus with hyperglycemia: Secondary | ICD-10-CM | POA: Diagnosis present

## 2013-06-25 DIAGNOSIS — K279 Peptic ulcer, site unspecified, unspecified as acute or chronic, without hemorrhage or perforation: Secondary | ICD-10-CM

## 2013-06-25 DIAGNOSIS — I359 Nonrheumatic aortic valve disorder, unspecified: Secondary | ICD-10-CM | POA: Diagnosis present

## 2013-06-25 DIAGNOSIS — J90 Pleural effusion, not elsewhere classified: Secondary | ICD-10-CM | POA: Diagnosis present

## 2013-06-25 DIAGNOSIS — Z66 Do not resuscitate: Secondary | ICD-10-CM | POA: Diagnosis present

## 2013-06-25 DIAGNOSIS — E8809 Other disorders of plasma-protein metabolism, not elsewhere classified: Secondary | ICD-10-CM | POA: Diagnosis present

## 2013-06-25 DIAGNOSIS — E876 Hypokalemia: Secondary | ICD-10-CM | POA: Diagnosis not present

## 2013-06-25 DIAGNOSIS — E875 Hyperkalemia: Secondary | ICD-10-CM | POA: Diagnosis present

## 2013-06-25 DIAGNOSIS — Z9861 Coronary angioplasty status: Secondary | ICD-10-CM

## 2013-06-25 DIAGNOSIS — J9620 Acute and chronic respiratory failure, unspecified whether with hypoxia or hypercapnia: Secondary | ICD-10-CM

## 2013-06-25 DIAGNOSIS — K429 Umbilical hernia without obstruction or gangrene: Secondary | ICD-10-CM | POA: Diagnosis present

## 2013-06-25 DIAGNOSIS — N179 Acute kidney failure, unspecified: Secondary | ICD-10-CM | POA: Diagnosis present

## 2013-06-25 DIAGNOSIS — N189 Chronic kidney disease, unspecified: Secondary | ICD-10-CM | POA: Diagnosis present

## 2013-06-25 DIAGNOSIS — R195 Other fecal abnormalities: Secondary | ICD-10-CM

## 2013-06-25 DIAGNOSIS — Z794 Long term (current) use of insulin: Secondary | ICD-10-CM

## 2013-06-25 DIAGNOSIS — IMO0001 Reserved for inherently not codable concepts without codable children: Secondary | ICD-10-CM | POA: Diagnosis present

## 2013-06-25 DIAGNOSIS — J449 Chronic obstructive pulmonary disease, unspecified: Secondary | ICD-10-CM

## 2013-06-25 DIAGNOSIS — Z954 Presence of other heart-valve replacement: Secondary | ICD-10-CM

## 2013-06-25 LAB — BLOOD GAS, ARTERIAL
Acid-Base Excess: 15.5 mmol/L — ABNORMAL HIGH (ref 0.0–2.0)
Drawn by: 28701
O2 Content: 4 L/min
O2 Saturation: 98.6 %
Patient temperature: 37
pO2, Arterial: 105 mmHg — ABNORMAL HIGH (ref 80.0–100.0)

## 2013-06-25 LAB — APTT: aPTT: 27 seconds (ref 24–37)

## 2013-06-25 LAB — CBC WITH DIFFERENTIAL/PLATELET
Basophils Absolute: 0 10*3/uL (ref 0.0–0.1)
Basophils Relative: 0 % (ref 0–1)
Eosinophils Absolute: 0 10*3/uL (ref 0.0–0.7)
Eosinophils Relative: 0 % (ref 0–5)
Hemoglobin: 9 g/dL — ABNORMAL LOW (ref 13.0–17.0)
MCH: 28.9 pg (ref 26.0–34.0)
MCHC: 29.6 g/dL — ABNORMAL LOW (ref 30.0–36.0)
MCV: 97.7 fL (ref 78.0–100.0)
Metamyelocytes Relative: 0 %
Myelocytes: 0 %
Neutro Abs: 17.3 10*3/uL — ABNORMAL HIGH (ref 1.7–7.7)
Neutrophils Relative %: 86 % — ABNORMAL HIGH (ref 43–77)
Promyelocytes Absolute: 0 %
RBC: 3.11 MIL/uL — ABNORMAL LOW (ref 4.22–5.81)

## 2013-06-25 LAB — COMPREHENSIVE METABOLIC PANEL
ALT: 19 U/L (ref 0–53)
AST: 14 U/L (ref 0–37)
Albumin: 3 g/dL — ABNORMAL LOW (ref 3.5–5.2)
Alkaline Phosphatase: 85 U/L (ref 39–117)
Calcium: 9 mg/dL (ref 8.4–10.5)
GFR calc Af Amer: 58 mL/min — ABNORMAL LOW (ref >90)
Glucose, Bld: 356 mg/dL — ABNORMAL HIGH (ref 70–99)
Potassium: 5.7 mEq/L — ABNORMAL HIGH (ref 3.5–5.1)
Sodium: 136 mEq/L (ref 135–145)
Total Protein: 7.6 g/dL (ref 6.0–8.3)

## 2013-06-25 LAB — GLUCOSE, CAPILLARY
Glucose-Capillary: 174 mg/dL — ABNORMAL HIGH (ref 70–99)
Glucose-Capillary: 377 mg/dL — ABNORMAL HIGH (ref 70–99)

## 2013-06-25 LAB — PROTIME-INR: Prothrombin Time: 14.2 seconds (ref 11.6–15.2)

## 2013-06-25 LAB — TYPE AND SCREEN

## 2013-06-25 MED ORDER — ONDANSETRON HCL 4 MG/2ML IJ SOLN: 4.0000 mg | Freq: Three times a day (TID) | INTRAMUSCULAR | Status: DC | PRN

## 2013-06-25 MED ORDER — PANTOPRAZOLE SODIUM 40 MG IV SOLR
40.0000 mg | INTRAVENOUS | Status: AC
  Administered 2013-06-25: 40 mg via INTRAVENOUS
  Filled 2013-06-25: qty 40

## 2013-06-25 MED ORDER — FAMOTIDINE IN NACL 20-0.9 MG/50ML-% IV SOLN
20.0000 mg | INTRAVENOUS | Status: AC
  Administered 2013-06-25: 20 mg via INTRAVENOUS
  Filled 2013-06-25: qty 50

## 2013-06-25 MED ORDER — SODIUM CHLORIDE 0.9 % IV SOLN
INTRAVENOUS | Status: AC
  Administered 2013-06-25: 10 mL/h via INTRAVENOUS

## 2013-06-25 NOTE — ED Notes (Signed)
CRITICAL VALUE ALERT  Critical value received:  CO2 - 42  Date of notification:  06/25/2013  Time of notification:  2057  Critical value read back: yes  Nurse who received alert:  LJS  MD notified (1st page):  Hyacinth Meeker  Time of first page:  2057  MD notified (2nd page):  Time of second page:  Responding MD:  Hyacinth Meeker  Time MD responded:  2057

## 2013-06-25 NOTE — ED Notes (Signed)
CRITICAL VALUE ALERT  Critical value received:  CO2 70.3  Date of notification:  06/25/13  Time of notification:  2136  Critical value read back:yes  Nurse who received alert:  B. Garner Nash, RN  MD notified (1st page):  Eber Hong  Time of first page:  2136  MD notified (2nd page):  Time of second page:  Responding MD:  Hyacinth Meeker  Time MD responded:  2136

## 2013-06-25 NOTE — ED Notes (Signed)
Pt co rectal bleeding today, Dr Darrick Penna performed colonoscopy 1 week ago, instructed pt to come to ER and he would come to the ER to see the patient. Pt also increased SOB, has HX of COPD.

## 2013-06-25 NOTE — ED Notes (Signed)
Family states home-health nurse checked patient's rectum because patient has not had a bowel movement in 5 days. No bleeding visible to family; however, they stated home-health nurse said patient had blood in his rectum. States they called Dr Darrick Penna and he advised them to go to the ED and he would meet them here.

## 2013-06-25 NOTE — ED Provider Notes (Signed)
History    CSN: [id number] Arrival date & time 06/25/13  4132  First MD Initiated Contact with Patient 06/25/13 1906     Chief Complaint  Patient presents with  . Rectal Bleeding  . Shortness of Breath   (Consider location/radiation/quality/duration/timing/severity/associated sxs/prior Treatment) HPI Comments: 77 year old male with a history of peptic ulcer disease, recent endoscopy showing peptic ulcer disease requiring twice a day PPI therapy. He also has a history of an esophageal stricture which was patent last week and a history of a recent thoracentesis for recurrent pleural effusion. He presents to the hospital today because of recurrent GI bleeding. The spouse stated that she saw blood in his stools today, though is not able to describe very well what they look like but thinks maybe they were read, maybe they were black. A home health aide that was there to take care of the patient today noted blood in the stools, called gastroenterology who recommended being seen in the emergency department.  The patient is a very poor historian, he is on oxygen therapy and is chronically dyspneic due to his COPD. Most of the information and history comes from his wife.  Patient is a 77 y.o. male presenting with hematochezia and shortness of breath. The history is provided by the patient, the spouse and medical records.  Rectal Bleeding Shortness of Breath  Past Medical History  Diagnosis Date  . COPD (chronic obstructive pulmonary disease)   . AF (atrial fibrillation)   . Hypertension   . Gout   . Arthritis   . Small bowel obstruction   . Renal insufficiency   . Aortic stenosis   . Hydropneumothorax   . Diabetes mellitus   . CHF (congestive heart failure)   . On home O2     qhs prn  . Pneumonia 05/04/2013    history of pneumonia  . Pneumonia 10/12  . Hydropneumothorax 2012   Past Surgical History  Procedure Laterality Date  . Rotator cuff repair      left and right  . Cardiac  valve replacement      aortic valve with a tissue prosthesis and left sided maze proc  . S/p rewiring of sternum for dehiscence    . Gallbladder surgery    . US echocardiography  01-03-11    EF 55-60%  . Cardiovascular stress test  01-26-09    EF 67%  . Coronary angioplasty    . Cholecystectomy    . Sternal incision reclosure  08/29/2010    sternal rewiring  . Cataract extraction w/phaco  10/02/2012    Procedure: CATARACT EXTRACTION PHACO AND INTRAOCULAR LENS PLACEMENT (IOC);  Surgeon: Gemma Payor, MD;  Location: [facility];  Service: Ophthalmology;  Laterality: Right;  CDE 16.68  . Cataract extraction w/phaco  10/27/2012    Procedure: CATARACT EXTRACTION PHACO AND INTRAOCULAR LENS PLACEMENT (IOC);  Surgeon: Gemma Payor, MD;  Location: [facility];  Service: Ophthalmology;  Laterality: Left;  CDE:16.89  . Artificial aortic valve    . Colonoscopy N/A 06/19/2013    Procedure: COLONOSCOPY;  Surgeon: West Bali, MD;  Location: [facility];  Service: Endoscopy;  Laterality: N/A;  . Esophagogastroduodenoscopy N/A 06/19/2013    Procedure: ESOPHAGOGASTRODUODENOSCOPY (EGD);  Surgeon: West Bali, MD;  Location: [facility];  Service: Endoscopy;  Laterality: N/A;   Family History  Problem Relation Age of Onset  . Hypertension Mother   . Stroke Mother   . Heart disease Sister   . Kidney disease Sister  History  Substance Use Topics  . Smoking status: Former Smoker    Types: Cigarettes    Quit date: 01/18/2005  . Smokeless tobacco: Never Used  . Alcohol Use: No    Review of Systems  Respiratory: Positive for shortness of breath.   Gastrointestinal: Positive for hematochezia.  All other systems reviewed and are negative.    Allergies  Review of patient's allergies indicates no known allergies.  Home Medications   Current Outpatient Rx  Name  Route  Sig  Dispense  Refill  . aspirin 81 MG chewable tablet   Oral   Chew 81 mg by mouth daily.         Marland Kitchen diltiazem (CARDIZEM CD) 360  MG 24 hr capsule   Oral   Take 1 capsule (360 mg total) by mouth daily.   30 capsule   12   . fluticasone (FLOVENT HFA) 110 MCG/ACT inhaler   Inhalation   Inhale 1 puff into the lungs 2 (two) times daily.         . furosemide (LASIX) 40 MG tablet   Oral   Take 1 tablet (40 mg total) by mouth daily.   30 tablet   12   . glyBURIDE (DIABETA) 5 MG tablet   Oral   Take 1 tablet (5 mg total) by mouth 2 (two) times daily with a meal.         . insulin aspart (NOVOLOG) 100 UNIT/ML injection   Subcutaneous   Inject 3-15 Units into the skin 3 (three) times daily with meals. Sliding scale as follows: 121-150=3 units 151-200=4 units 201-250=7 units 251-300=9 units 301-350=12 units 351-400=15 units         . insulin glargine (LANTUS) 100 UNIT/ML injection   Subcutaneous   Inject 0.1 mLs (10 Units total) into the skin daily.   10 mL   12   . ipratropium-albuterol (DUONEB) 0.5-2.5 (3) MG/3ML SOLN   Nebulization   Take 3 mLs by nebulization 4 (four) times daily.         . pantoprazole (PROTONIX) 40 MG tablet   Oral   Take 1 tablet (40 mg total) by mouth 2 (two) times daily before a meal.   60 tablet   12   . polyethylene glycol (MIRALAX / GLYCOLAX) packet   Oral   Take 17 g by mouth daily.         . potassium chloride SA (K-DUR,KLOR-CON) 20 MEQ tablet   Oral   Take 20 mEq by mouth daily.         . predniSONE (DELTASONE) 10 MG tablet   Oral   Take 1 tablet (10 mg total) by mouth daily with breakfast.   42 tablet   0     Take 3 tablets daily for 1 week then 2 tablets dai ...   . acetaminophen (TYLENOL) 500 MG tablet   Oral   Take 1,000 mg by mouth every 6 (six) hours as needed. Pain         . magnesium hydroxide (MILK OF MAGNESIA) 400 MG/5ML suspension   Oral   Take 30 mLs by mouth daily as needed for constipation.         . sodium chloride (OCEAN) 0.65 % SOLN nasal spray   Nasal   Place 1 spray into the nose as needed for congestion.      0     BP 146/75  Pulse 82  Temp(Src) 97.7 F (36.5 C) (Oral)  Resp 21  SpO2  98% Physical Exam  Nursing note and vitals reviewed. Constitutional: He appears well-developed and well-nourished.  Uncomfortable appearing  HENT:  Head: Normocephalic and atraumatic.  Mouth/Throat: Oropharynx is clear and moist. No oropharyngeal exudate.  Eyes: Conjunctivae and EOM are normal. Pupils are equal, round, and reactive to light. Right eye exhibits no discharge. Left eye exhibits no discharge. No scleral icterus.  Neck: Normal range of motion. Neck supple. No JVD present. No thyromegaly present.  Cardiovascular: Normal heart sounds and intact distal pulses.  Exam reveals no gallop and no friction rub.   No murmur heard. Borderline tachycardia, atrial fibrillation  Pulmonary/Chest: No respiratory distress. He has wheezes. He has no rales.  Mild diffuse wheezing, speaks in short sentences  Abdominal: Soft. Bowel sounds are normal. He exhibits distension. He exhibits no mass. There is no tenderness.  Mildly distended nontender abdomen  Genitourinary:  Rectal exam shows no fissures, no hemorrhoids, melanotic stool in the rectal vault is soft and black  Musculoskeletal: Normal range of motion. He exhibits no edema and no tenderness.  Lymphadenopathy:    He has no cervical adenopathy.  Neurological: He is alert. Coordination normal.  Skin: Skin is warm and dry. No rash noted. No erythema.  Psychiatric: He has a normal mood and affect. His behavior is normal.    ED Course  Procedures (including critical care time) Labs Reviewed  CBC WITH DIFFERENTIAL - Abnormal; Notable for the following:    WBC 20.1 (*)    RBC 3.11 (*)    Hemoglobin 9.0 (*)    HCT 30.4 (*)    MCHC 29.6 (*)    RDW 20.7 (*)    Neutrophils Relative % 86 (*)    Lymphocytes Relative 7 (*)    Neutro Abs 17.3 (*)    Monocytes Absolute 1.4 (*)    All other components within normal limits  COMPREHENSIVE METABOLIC PANEL - Abnormal;  Notable for the following:    Potassium 5.7 (*)    Chloride 89 (*)    CO2 42 (*)    Glucose, Bld 356 (*)    BUN 47 (*)    Albumin 3.0 (*)    Total Bilirubin 1.9 (*)    GFR calc non Af Amer 50 (*)    GFR calc Af Amer 58 (*)    All other components within normal limits  BLOOD GAS, ARTERIAL - Abnormal; Notable for the following:    pCO2 arterial 70.3 (*)    pO2, Arterial 105.0 (*)    Bicarbonate 41.3 (*)    Acid-Base Excess 15.5 (*)    Allens test (pass/fail) NOT INDICATED (*)    All other components within normal limits  APTT  PROTIME-INR  TYPE AND SCREEN   Dg Chest Port 1 View  06/25/2013   *RADIOLOGY REPORT*  Clinical Data: Short of breath  PORTABLE CHEST - 1 VIEW  Comparison: 06/17/2013  Findings: Worsening congestive heart failure and edema.  Diffuse bilateral edema.  Increasing left effusion and left lower lobe atelectasis.  IMPRESSION: Worsening congestive heart failure and edema.  Worsening left pleural effusion   Original Report Authenticated By: Janeece Riggers, M.D.   1. Upper GI bleed   2. COPD (chronic obstructive pulmonary disease)     MDM  The patient is a recurrent gastrointestinal bleed, he does not appear to be in distress though he has a borderline blood pressure of 105 systolic. We'll obtain laboratory workup, Hemoccult was positive for blood, discussed with Dr. Darrick Penna of gastroenterology who will see the  patient in consultation as needed but at this time an emergent procedure is not necessary.  Discussed case with Dr. Orvan Falconer of the hospitalist service who will admit.  The chest x-ray reveals an ongoing left-sided pleural effusion, the blood work shows that he has a leukocytosis however he is on prednisone at this time. His hemoglobin has been stable over the last couple of months and has no lower today.  Vida Roller, MD 06/25/13 2153

## 2013-06-25 NOTE — ED Notes (Signed)
Pt on chronic O2 3-4L

## 2013-06-25 NOTE — ED Notes (Addendum)
Patient's O2 saturation decreases to 85% upon transferring from wheelchair to bed. Patient is on 3L nasal canula at all times and upon movement to bed. Upon resting in bed, patient's O2 saturation increases to 96%. Family states this happens when patient moves or exerts himself at all.

## 2013-06-25 NOTE — H&P (Signed)
[name] History and Physical  [name] [name]  WUJ:[id number]  DOB: [birth year]   DOA: 06/25/2013   PCP:   Fredirick Maudlin, MD   Chief Complaint:  Black stool noted today  HPI: [name] [name] is a 77 y.o. male.   Caucasian gentleman with multiple medical problems noted below; discharge from hospital 3 days ago, after treatment for healthcare associated pneumonia; had EGD for heme positive stool during that hospitalization, and was noted to have evidence of old bleeding, with multiple small gastric ulcers. Patient was discharged home health care and home health nurse noted her what appeared to be melena stool today; his gastroenterologist was contacted and she recommended going immediately to the emergency room. In the emergency room after a history and laboratory evaluation hospitalist service was called for admission.  The patient's wife reports he has formed dark green stool for the first time in a while today, and he has a tendency to constipation. She reports she saw streaks of blood with stool.  He has otherwise had no dizziness no diaphoresis   Rewiew of Systems:  No dizziness diaphoresis or syncope no fever nor chills No change from his recent discharge    Past Medical History  Diagnosis Date  . COPD (chronic obstructive pulmonary disease)   . AF (atrial fibrillation)   . Hypertension   . Gout   . Arthritis   . Small bowel obstruction   . Renal insufficiency   . Aortic stenosis   . Hydropneumothorax   . Diabetes mellitus   . CHF (congestive heart failure)   . On home O2     qhs prn  . Pneumonia 05/04/2013    history of pneumonia  . Pneumonia 10/12  . Hydropneumothorax 2012    Past Surgical History  Procedure Laterality Date  . Rotator cuff repair      left and right  . Cardiac valve replacement      aortic valve with a tissue prosthesis and left sided maze proc  . S/p rewiring of sternum for dehiscence    . Gallbladder surgery    . US  echocardiography  01-03-11    EF 55-60%  . Cardiovascular stress test  01-26-09    EF 67%  . Coronary angioplasty    . Cholecystectomy    . Sternal incision reclosure  08/29/2010    sternal rewiring  . Cataract extraction w/phaco  10/02/2012    Procedure: CATARACT EXTRACTION PHACO AND INTRAOCULAR LENS PLACEMENT (IOC);  Surgeon: Gemma Payor, MD;  Location: [facility];  Service: Ophthalmology;  Laterality: Right;  CDE 16.68  . Cataract extraction w/phaco  10/27/2012    Procedure: CATARACT EXTRACTION PHACO AND INTRAOCULAR LENS PLACEMENT (IOC);  Surgeon: Gemma Payor, MD;  Location: [facility];  Service: Ophthalmology;  Laterality: Left;  CDE:16.89  . Artificial aortic valve    . Colonoscopy N/A 06/19/2013    Procedure: COLONOSCOPY;  Surgeon: West Bali, MD;  Location: [facility];  Service: Endoscopy;  Laterality: N/A;  . Esophagogastroduodenoscopy N/A 06/19/2013    Procedure: ESOPHAGOGASTRODUODENOSCOPY (EGD);  Surgeon: West Bali, MD;  Location: [facility];  Service: Endoscopy;  Laterality: N/A;    Medications:  HOME MEDS: Prior to Admission medications   Medication Sig Start Date End Date Taking? Authorizing Provider  aspirin 81 MG chewable tablet Chew 81 mg by mouth daily.   Yes Historical Provider, MD  diltiazem (CARDIZEM CD) 360 MG 24 hr capsule Take 1 capsule (360 mg total) by mouth daily. 06/22/13  Yes Fredirick Maudlin, MD  fluticasone (FLOVENT HFA) 110 MCG/ACT inhaler Inhale 1 puff into the lungs 2 (two) times daily.   Yes Historical Provider, MD  furosemide (LASIX) 40 MG tablet Take 1 tablet (40 mg total) by mouth daily. 06/22/13  Yes Fredirick Maudlin, MD  glyBURIDE (DIABETA) 5 MG tablet Take 1 tablet (5 mg total) by mouth 2 (two) times daily with a meal. 05/26/13  Yes Storm Frisk, MD  insulin aspart (NOVOLOG) 100 UNIT/ML injection Inject 3-15 Units into the skin 3 (three) times daily with meals. Sliding scale as follows: 121-150=3 units 151-200=4 units 201-250=7 units 251-300=9  units 301-350=12 units 351-400=15 units   Yes Historical Provider, MD  insulin glargine (LANTUS) 100 UNIT/ML injection Inject 0.1 mLs (10 Units total) into the skin daily. 05/26/13  Yes Storm Frisk, MD  ipratropium-albuterol (DUONEB) 0.5-2.5 (3) MG/3ML SOLN Take 3 mLs by nebulization 4 (four) times daily.   Yes Historical Provider, MD  pantoprazole (PROTONIX) 40 MG tablet Take 1 tablet (40 mg total) by mouth 2 (two) times daily before a meal. 06/22/13  Yes Fredirick Maudlin, MD  polyethylene glycol (MIRALAX / GLYCOLAX) packet Take 17 g by mouth daily.   Yes Historical Provider, MD  potassium chloride SA (K-DUR,KLOR-CON) 20 MEQ tablet Take 20 mEq by mouth daily.   Yes Historical Provider, MD  predniSONE (DELTASONE) 10 MG tablet Take 1 tablet (10 mg total) by mouth daily with breakfast. 06/22/13  Yes Fredirick Maudlin, MD  acetaminophen (TYLENOL) 500 MG tablet Take 1,000 mg by mouth every 6 (six) hours as needed. Pain    Historical Provider, MD  magnesium hydroxide (MILK OF MAGNESIA) 400 MG/5ML suspension Take 30 mLs by mouth daily as needed for constipation.    Historical Provider, MD  sodium chloride (OCEAN) 0.65 % SOLN nasal spray Place 1 spray into the nose as needed for congestion. 05/26/13   Storm Frisk, MD     Allergies:  No Known Allergies  Social History:   reports that he quit smoking about 8 years ago. His smoking use included Cigarettes. He smoked 0.00 packs per day. He has never used smokeless tobacco. He reports that he does not drink alcohol or use illicit drugs.  Family History: Family History  Problem Relation Age of Onset  . Hypertension Mother   . Stroke Mother   . Heart disease Sister   . Kidney disease Sister      Physical Exam: Filed Vitals:   06/25/13 1859 06/25/13 2000 06/25/13 2011 06/25/13 2100  BP: 105/59 135/67 136/57 146/75  Pulse: 49 84 49 82  Temp: 97.7 F (36.5 C)     TempSrc: Oral     Resp: 16 21 24 21   SpO2: 95%  98%    Blood pressure  146/75, pulse 82, temperature 97.7 F (36.5 C), temperature source Oral, resp. rate 21, SpO2 98.00%. There is no weight on file to calculate BMI.   GEN:  Pleasant elderly Caucasian gentleman sitting up bed in no acute distress; cooperative with exam PSYCH:  alert and oriented ;  neither anxious nor depressed; affect is appropriate. HEENT: Mucous membranes pink, dry and anicteric; PERRLA; EOM intact;  Breasts:: Not examined CHEST WALL: No tenderness CHEST: Bilateral rhonchi,  HEART: Irregular rhythm BACK: No kyphosis no scoliosis; no CVA tenderness; left lower chest Band-Aid noticed over recent thoracocentesis and ABDOMEN: Obese, soft non-tender; no masses, no organomegaly,   Rectal Exam: Not done EXTREMITIES:  age-appropriate arthropathy of the hands and  knees;  no ulcerations. Genitalia: not examined PULSES: 2+ and symmetric SKIN: Normal hydration; extensive actinic keratosis CNS: Cranial nerves 2-12 grossly intact no focal lateralizing neurologic deficit   Labs on Admission:  Basic Metabolic Panel:  Recent Labs Lab 06/19/13 0507 06/21/13 0519 06/22/13 0601 06/25/13 1925  NA 137 136 139 136  K 3.6 3.7 4.0 5.7*  CL 92* 93* 97 89*  CO2 40* 39* 41* 42*  GLUCOSE 67* 219* 192* 356*  BUN 62* 61* 58* 47*  CREATININE 1.11 1.34 1.37* 1.35  CALCIUM 8.6 8.3* 8.6 9.0   Liver Function Tests:  Recent Labs Lab 06/25/13 1925  AST 14  ALT 19  ALKPHOS 85  BILITOT 1.9*  PROT 7.6  ALBUMIN 3.0*   No results found for this basename: LIPASE, AMYLASE,  in the last 168 hours No results found for this basename: AMMONIA,  in the last 168 hours CBC:  Recent Labs Lab 06/19/13 0507 06/21/13 0519 06/25/13 1925  WBC 16.3* 16.2* 20.1*  NEUTROABS 12.4* 12.8* 17.3*  HGB 9.5* 8.3* 9.0*  HCT 30.1* 26.9* 30.4*  MCV 94.4 94.7 97.7  PLT 153 144* 237   Cardiac Enzymes: No results found for this basename: CKTOTAL, CKMB, CKMBINDEX, TROPONINI,  in the last 168 hours BNP: No components found  with this basename: POCBNP,  D-dimer: No components found with this basename: D-DIMER,  CBG:  Recent Labs Lab 06/21/13 1120 06/21/13 1617 06/21/13 2022 06/22/13 0729 06/22/13 1115  GLUCAP 377* 271* 271* 149* 177*    Radiological Exams on Admission: Dg Chest Port 1 View  06/25/2013   *RADIOLOGY REPORT*  Clinical Data: Short of breath  PORTABLE CHEST - 1 VIEW  Comparison: 06/17/2013  Findings: Worsening congestive heart failure and edema.  Diffuse bilateral edema.  Increasing left effusion and left lower lobe atelectasis.  IMPRESSION: Worsening congestive heart failure and edema.  Worsening left pleural effusion   Original Report Authenticated By: Janeece Riggers, M.D.      Assessment/Plan   Active Problems:   COPD (chronic obstructive pulmonary disease)   Type 2 diabetes mellitus, uncontrolled   Anemia, normocytic normochromic   Pleural effusion   Black stool   PUD (peptic ulcer disease)  PLAN: Will Hemoccult stool to assess whether this is actually melena; the description by his wife does not sound like melena. We'll continue to monitor his hemoglobin which is currently at baseline. Continue serial nebulizations for COPD Will withhold Lasix since he appears to be dehydrated but will not hydrate, his chest x-ray show worsening congestion; Defer further management of chest problems to his primary physician/pulmonologist Continue Lantus and sliding scale insulin for diabetes; hold glyburide His potassium is borderline elevated, we'll not treat at this time reevaluate in the morning; discontinue potassium supplements for the time being  His globulin and albumin ratio is elevated, and consideration can be given to protein electrophoresis to screen for myeloma, in the absence of other disease such as hepatitis C  Other plans as per orders. To be followed up by his primary care physician and gastroenterologist in the morning  Code Status: Full Family Communication: Plans discuss  with patient and family at bedside Disposition Plan: Depending on response to initial therapy   [name] [name] Nocturnist [name] Pager [phone]   06/25/2013, 9:41 PM

## 2013-06-25 NOTE — Discharge Summary (Signed)
Physician Discharge Summary  Patient ID: [name] [name] MRN: [id number] DOB/AGE: 1936-09-06 77 y.o. Primary Care Physician:Dayyan Krist L, MD Admit date: 06/07/2013 Discharge date: 06/25/2013    Discharge Diagnoses:   Principal Problem:   HCAP (healthcare-associated pneumonia) Active Problems:   Atrial flutter   COPD (chronic obstructive pulmonary disease)   Hypertension   Type 2 diabetes mellitus, uncontrolled   Acute-on-chronic respiratory failure   Acute on chronic diastolic CHF (congestive heart failure), NYHA class 1   Anemia, normocytic normochromic   Acute on chronic renal failure   Pleural effusion  physical deconditioning    Medication List    STOP taking these medications       feeding supplement Liqd      TAKE these medications       acetaminophen 500 MG tablet  Commonly known as:  TYLENOL  Take 1,000 mg by mouth every 6 (six) hours as needed. Pain     aspirin 81 MG chewable tablet  Chew 81 mg by mouth daily.     diltiazem 360 MG 24 hr capsule  Commonly known as:  CARDIZEM CD  Take 1 capsule (360 mg total) by mouth daily.     fluticasone 110 MCG/ACT inhaler  Commonly known as:  FLOVENT HFA  Inhale 1 puff into the lungs 2 (two) times daily.     furosemide 40 MG tablet  Commonly known as:  LASIX  Take 1 tablet (40 mg total) by mouth daily.     glyBURIDE 5 MG tablet  Commonly known as:  DIABETA  Take 1 tablet (5 mg total) by mouth 2 (two) times daily with a meal.     insulin aspart 100 UNIT/ML injection  Commonly known as:  novoLOG  Inject 3-15 Units into the skin 3 (three) times daily with meals. Sliding scale as follows:  121-150=3 units  151-200=4 units  201-250=7 units  251-300=9 units  301-350=12 units  351-400=15 units     insulin glargine 100 UNIT/ML injection  Commonly known as:  LANTUS  Inject 0.1 mLs (10 Units total) into the skin daily.     ipratropium-albuterol 0.5-2.5 (3) MG/3ML Soln  Commonly known as:  DUONEB  Take  3 mLs by nebulization 4 (four) times daily.     magnesium hydroxide 400 MG/5ML suspension  Commonly known as:  MILK OF MAGNESIA  Take 30 mLs by mouth daily as needed for constipation.     pantoprazole 40 MG tablet  Commonly known as:  PROTONIX  Take 1 tablet (40 mg total) by mouth 2 (two) times daily before a meal.     polyethylene glycol packet  Commonly known as:  MIRALAX / GLYCOLAX  Take 17 g by mouth daily.     potassium chloride SA 20 MEQ tablet  Commonly known as:  K-DUR,KLOR-CON  Take 20 mEq by mouth daily.     predniSONE 10 MG tablet  Commonly known as:  DELTASONE  Take 1 tablet (10 mg total) by mouth daily with breakfast.     sodium chloride 0.65 % Soln nasal spray  Commonly known as:  OCEAN  Place 1 spray into the nose as needed for congestion.        Discharged Condition: Improved    Consults: Cardiology/radiology  Significant Diagnostic Studies: Dg Chest 1 View  06/17/2013   *RADIOLOGY REPORT*  Clinical Data: Left pleural effusion post thoracentesis  CHEST - 1 VIEW  Comparison: Expiratory [facility] chest radiograph compared to 06/16/2013  Findings: Marked decrease in left pleural effusion and  left lung atelectasis post thoracentesis. Residual pleural effusion and atelectasis at left base. No pneumothorax. Enlargement of cardiac silhouette post median sternotomy and AVR. Right arm PICC line tip projects over SVC. Atherosclerotic calcification aorta. Persistent right basilar infiltrate. Diffuse osseous demineralization.  IMPRESSION: Marked decrease in left pleural effusion and basilar atelectasis post thoracentesis. No pneumothorax. Enlargement of cardiac silhouette post AVR. Persistent right basilar infiltrate.   Original Report Authenticated By: Ulyses Southward, M.D.   Dg Chest Port 1 View  06/16/2013   *RADIOLOGY REPORT*  Clinical Data: Hypoxia.  PORTABLE CHEST - 1 VIEW  Comparison: 06/10/2013.  Findings: Large left pleural effusion with a significant increase in size.   Increased airspace opacity in the remaining aerated left upper lobe.  No significant change in right lung airspace opacity and prominence of the interstitial markings.  Stable median sternotomy wires and prosthetic heart valve.  Thoracic spine degenerative changes.  IMPRESSION:  1.  Significant increase in the size of a large left pleural effusion. 2.  Increased left lung atelectasis or pneumonia. 3.  Stable right lung airspace opacity suspicious for pneumonia and chronic interstitial lung disease.   Original Report Authenticated By: Beckie Salts, M.D.   Dg Chest Port 1 View  06/10/2013   *RADIOLOGY REPORT*  Clinical Data: PICC placement  PORTABLE CHEST - 1 VIEW  Comparison: 06/07/2013 and CT chest 05/21/2013.  Findings: Right PICC tip is directed superiorly in the region of the SVC, suggesting a possible azygos location.  Heart size is grossly stable.  Large left pleural effusion and bibasilar dependent air space disease persist.  No definite right pleural fluid.  Minimal biapical pleural parenchymal scarring.  IMPRESSION:  1.  Right PICC is directed superiorly in the region of the SVC, and may be within the azygos vein.  Retracting approximately 3.5 cm should better position the tip in the region of the SVC. These results will be called to the ordering clinician or representative by the Radiologist Assistant, and communication documented in the PACS Dashboard.  2.  Large left pleural effusion with bilateral air space disease, left greater than right, stable.   Original Report Authenticated By: Leanna Battles, M.D.   Dg Chest Port 1 View  06/07/2013   *RADIOLOGY REPORT*  Clinical Data: Tachycardia and shortness of breath.  PORTABLE CHEST - 1 VIEW  Comparison: Chest x-ray 05/25/2013.  Findings: Emphysematous changes again noted.  Markedly worsened opacity at the base of the left hemithorax compatible with increasing atelectasis and/or consolidation and moderate to large left-sided pleural effusion.  Patchy  multifocal interstitial and airspace opacities are also noted throughout the mid to lower lungs bilaterally, and have also increased slightly compared to the prior examination.  No evidence of pulmonary edema.  Heart size is upper limits of normal. The patient is rotated to the left on today's exam, resulting in distortion of the mediastinal contours and reduced diagnostic sensitivity and specificity for mediastinal pathology.  Atherosclerosis in the thoracic aorta.  Status post median sternotomy for aortic valve replacement (a stented bioprosthesis is noted).  IMPRESSION: 1.  Worsening multifocal interstitial and airspace disease throughout the mid and lower lungs bilaterally, particularly at the left base where there is also an enlarging moderate to large left pleural effusion.  Findings are concerning for old worsening multilobar pneumonia.   Original Report Authenticated By: Trudie Reed, M.D.   Dg Chest Port 1v Same Day  06/10/2013   *RADIOLOGY REPORT*  Clinical Data: PICC line placement  PORTABLE CHEST - 1 VIEW SAME DAY  Comparison: 06/10/2013 at 10:23 a.m.  Findings: 1159 hours.  PICC line has been repositioned.  Its tip is difficult to visualize, but likely terminates over the mid SVC. No pneumothorax.  Prior median sternotomy. Cardiomegaly accentuated by [facility] portable technique.  Moderate left-sided pleural effusion.  Moderate interstitial edema.  Left greater than right lower lobe predominant airspace disease.  IMPRESSION: Right-sided PICC line terminating at mid SVC.  Otherwise, similar appearance of congestive heart failure with left pleural effusion and lower lobe predominant airspace disease.   Original Report Authenticated By: Jeronimo Greaves, M.D.   US Thoracentesis Asp Pleural Space W/img Guide  06/17/2013   *RADIOLOGY REPORT*  CLINICAL DATA: Left pleural effusion  ULTRASOUND GUIDED LEFT THORACENTESIS:  Technique: After explanation of procedure and risks, written informed consent for  thoracentesis obtained. Time out protocol followed. Left pleural effusion localized by ultrasound. Site at posterior leftchest prepped and draped in usual sterile fashion. Skin and chest wall anesthetized with 6 ml of 1% lidocaine. Standard 8-French thoracentesis catheter placed into left pleural space. 1480 ml of orange colored fluid aspirated by syringe pump. Procedure tolerated well by patient without immediate complication.  IMPRESSION: Ultrasound-guided left thoracentesis with removal of 1480 ml of orange colored left pleural fluid. Fluid sent to laboratory for requested analysis.   Original Report Authenticated By: Ulyses Southward, M.D.    Lab Results: Basic Metabolic Panel: No results found for this basename: NA, K, CL, CO2, GLUCOSE, BUN, CREATININE, CALCIUM, MG, PHOS,  in the last 72 hours Liver Function Tests: No results found for this basename: AST, ALT, ALKPHOS, BILITOT, PROT, ALBUMIN,  in the last 72 hours   CBC: No results found for this basename: WBC, NEUTROABS, HGB, HCT, MCV, PLT,  in the last 72 hours  Recent Results (from the past 240 hour(s))  AFB CULTURE WITH SMEAR     Status: None   Collection Time    06/17/13 11:20 AM      Result Value Range Status   Specimen Description FLUID LEFT PLEURAL COLLECTED BY DOCTOR BOLES   Final   Special Requests IMMUNE:COMPROMISED   Final   ACID FAST SMEAR NO ACID FAST BACILLI SEEN   Final   Culture     Final   Value: CULTURE WILL BE EXAMINED FOR 6 WEEKS BEFORE ISSUING A FINAL REPORT   Report Status PENDING   Incomplete  BODY FLUID CULTURE     Status: None   Collection Time    06/17/13 11:20 AM      Result Value Range Status   Specimen Description FLUID LEFT PLEURAL COLLECTED BY DOCTOR BOLES   Final   Special Requests IMMUNE:COMPROMISED   Final   Gram Stain     Final   Value: RARE WBC PRESENT, PREDOMINANTLY MONONUCLEAR     NO ORGANISMS SEEN   Culture NO GROWTH 3 DAYS   Final   Report Status 06/20/2013 FINAL   Final     Hospital  Course: This is a 77 year old who was admitted from a skilled care facility. He had been at [facility] for approximately a month with respiratory failure then was transferred to the skilled care facility for rehabilitation. He had been doing fairly well at the skilled care facility when he developed increasing shortness of breath cough congestion and tachycardia. He was brought to the emergency department where he was noted to have healthcare associated pneumonia by chest x-ray. He was also having atrial flutter/fibrillation with rapid ventricular response. He had signs and symptoms of congestive heart  failure with peripheral edema and an elevated pro BNP. He was treated with intravenous antibiotics and intravenous diuretics. Diltiazem was increased and he had better heart rate control. He was noted to have a large pleural effusion and he had a thoracentesis with removal of approximately 1.5 L of fluid. This improved his situation. He had a variable hospital course with episodes of increasing shortness of breath but generally continued to improve. He finished IV antibiotics. He was able to ambulate. He was on nasal oxygen. His heart rate was controlled.  Discharge Exam: Blood pressure 135/66, pulse 78, temperature 97.5 F (36.4 C), temperature source Oral, resp. rate 20, height 5\' 6"  (1.676 m), weight 82.918 kg (182 lb 12.8 oz), SpO2 92.00%. He is less dyspneic. He still short of breath with exertion. His chest is much clearer. He still has an irregular heartbeat. His edema has resolved.  Disposition: Home. He will need extensive home health services and this has been arranged      Discharge Orders   Future Appointments Provider Department Dept Phone   08/05/2013 8:30 AM Corbin Ade, MD [facility] [phone]   Future Orders Complete By Expires     Discharge patient  As directed     Face-to-face encounter (required for Medicare/Medicaid patients)  As directed      Comments:      I Moritz Lever L certify that this patient is under my care and that I, or a nurse practitioner or physician's assistant working with me, had a face-to-face encounter that meets the physician face-to-face encounter requirements with this patient on 06/22/2013. The encounter with the patient was in whole, or in part for the following medical condition(s) which is the primary reason for home health care (List medical condition): Healthcare associated pneumonia/CHF    Questions:      The encounter with the patient was in whole, or in part, for the following medical condition, which is the primary reason for home health care:  Healthcare associated pneumonia/CHF    I certify that, based on my findings, the following services are medically necessary home health services:  Nursing    Physical therapy    My clinical findings support the need for the above services:  Shortness of breath with activity    Further, I certify that my clinical findings support that this patient is homebound due to:  Shortness of Breath with activity    Reason for Medically Necessary Home Health Services:  Skilled Nursing- Change/Decline in Patient Status    For home use only DME oxygen  As directed     Questions:      Mode or (Route):  Nasal cannula    Liters per Minute:  4    Frequency:  Continuous    Oxygen conserving device:  No    Home Health  As directed     Scheduling Instructions:      Please do CBC and basic metabolic profile on 06/24/2013    Questions:      To provide the following care/treatments:  PT    RN    Home Health Aide       Follow-up Information   Follow up with [facility] In 2 days. (they will call you)    Contact information:   [address] [place] [address] [phone]      Signed: Fredirick Maudlin Pager [phone]  06/25/2013, 8:38 AM

## 2013-06-26 ENCOUNTER — Encounter (HOSPITAL_COMMUNITY): Payer: Self-pay | Admitting: Gastroenterology

## 2013-06-26 DIAGNOSIS — K922 Gastrointestinal hemorrhage, unspecified: Secondary | ICD-10-CM

## 2013-06-26 DIAGNOSIS — R195 Other fecal abnormalities: Secondary | ICD-10-CM

## 2013-06-26 LAB — PRO B NATRIURETIC PEPTIDE: Pro B Natriuretic peptide (BNP): 2385 pg/mL — ABNORMAL HIGH (ref 0–450)

## 2013-06-26 LAB — GLUCOSE, CAPILLARY: Glucose-Capillary: 153 mg/dL — ABNORMAL HIGH (ref 70–99)

## 2013-06-26 LAB — OCCULT BLOOD, POC DEVICE: Fecal Occult Bld: POSITIVE — AB

## 2013-06-26 LAB — CBC
HCT: 30.3 % — ABNORMAL LOW (ref 39.0–52.0)
MCHC: 30 g/dL (ref 30.0–36.0)
MCV: 97.1 fL (ref 78.0–100.0)
RDW: 20.7 % — ABNORMAL HIGH (ref 11.5–15.5)

## 2013-06-26 LAB — AFB CULTURE WITH SMEAR (NOT AT ARMC): Acid Fast Smear: NONE SEEN

## 2013-06-26 LAB — BASIC METABOLIC PANEL
BUN: 47 mg/dL — ABNORMAL HIGH (ref 6–23)
CO2: 42 mEq/L (ref 19–32)
Chloride: 95 mEq/L — ABNORMAL LOW (ref 96–112)
Creatinine, Ser: 1.55 mg/dL — ABNORMAL HIGH (ref 0.50–1.35)
Glucose, Bld: 187 mg/dL — ABNORMAL HIGH (ref 70–99)

## 2013-06-26 LAB — CLOSTRIDIUM DIFFICILE BY PCR: Toxigenic C. Difficile by PCR: NEGATIVE

## 2013-06-26 MED ORDER — PREDNISONE 10 MG PO TABS
10.0000 mg | ORAL_TABLET | Freq: Every day | ORAL | Status: DC
  Administered 2013-06-26: 10 mg via ORAL
  Filled 2013-06-26: qty 1

## 2013-06-26 MED ORDER — FLUTICASONE PROPIONATE HFA 110 MCG/ACT IN AERO
1.0000 | INHALATION_SPRAY | Freq: Two times a day (BID) | RESPIRATORY_TRACT | Status: DC
  Administered 2013-06-26 – 2013-07-02 (×14): 1 via RESPIRATORY_TRACT
  Filled 2013-06-26: qty 12

## 2013-06-26 MED ORDER — INSULIN ASPART 100 UNIT/ML ~~LOC~~ SOLN
0.0000 [IU] | Freq: Every day | SUBCUTANEOUS | Status: DC
  Administered 2013-06-26: 2 [IU] via SUBCUTANEOUS
  Administered 2013-06-26: 3 [IU] via SUBCUTANEOUS
  Administered 2013-06-27: 2 [IU] via SUBCUTANEOUS
  Administered 2013-07-01: 21:00:00 via SUBCUTANEOUS

## 2013-06-26 MED ORDER — SODIUM CHLORIDE 0.9 % IV SOLN
250.0000 mg | Freq: Four times a day (QID) | INTRAVENOUS | Status: DC
  Administered 2013-06-26 – 2013-06-29 (×12): 250 mg via INTRAVENOUS
  Filled 2013-06-26 (×13): qty 250

## 2013-06-26 MED ORDER — POTASSIUM CHLORIDE CRYS ER 20 MEQ PO TBCR: 20.0000 meq | EXTENDED_RELEASE_TABLET | Freq: Every day | ORAL | Status: DC

## 2013-06-26 MED ORDER — FUROSEMIDE 10 MG/ML IJ SOLN
80.0000 mg | Freq: Two times a day (BID) | INTRAMUSCULAR | Status: DC
Start: 2013-06-26 — End: 2013-07-01
  Administered 2013-06-26 – 2013-07-01 (×11): 80 mg via INTRAVENOUS
  Filled 2013-06-26 (×11): qty 8

## 2013-06-26 MED ORDER — PANTOPRAZOLE SODIUM 40 MG IV SOLR
40.0000 mg | Freq: Two times a day (BID) | INTRAVENOUS | Status: DC
  Administered 2013-06-26 – 2013-06-30 (×8): 40 mg via INTRAVENOUS
  Filled 2013-06-26 (×8): qty 40

## 2013-06-26 MED ORDER — BIOTENE DRY MOUTH MT LIQD
15.0000 mL | Freq: Two times a day (BID) | OROMUCOSAL | Status: DC
  Administered 2013-06-26 – 2013-07-02 (×11): 15 mL via OROMUCOSAL

## 2013-06-26 MED ORDER — POLYETHYLENE GLYCOL 3350 17 G PO PACK: 17.0000 g | PACK | Freq: Every day | ORAL | Status: DC

## 2013-06-26 MED ORDER — PANTOPRAZOLE SODIUM 40 MG IV SOLR
40.0000 mg | INTRAVENOUS | Status: DC
  Administered 2013-06-26: 40 mg via INTRAVENOUS
  Filled 2013-06-26: qty 40

## 2013-06-26 MED ORDER — ONDANSETRON HCL 4 MG/2ML IJ SOLN: 4.0000 mg | INTRAMUSCULAR | Status: DC | PRN

## 2013-06-26 MED ORDER — INSULIN ASPART 100 UNIT/ML ~~LOC~~ SOLN
0.0000 [IU] | Freq: Three times a day (TID) | SUBCUTANEOUS | Status: DC
  Administered 2013-06-26: 1 [IU] via SUBCUTANEOUS
  Administered 2013-06-26: 2 [IU] via SUBCUTANEOUS
  Administered 2013-06-27: 3 [IU] via SUBCUTANEOUS
  Administered 2013-06-27: 1 [IU] via SUBCUTANEOUS
  Administered 2013-06-28: 9 [IU] via SUBCUTANEOUS
  Administered 2013-06-28: 7 [IU] via SUBCUTANEOUS
  Administered 2013-06-29: 2 [IU] via SUBCUTANEOUS
  Administered 2013-06-29: 9 [IU] via SUBCUTANEOUS
  Administered 2013-06-29 – 2013-06-30 (×2): 3 [IU] via SUBCUTANEOUS
  Administered 2013-06-30: 2 [IU] via SUBCUTANEOUS
  Administered 2013-06-30: 7 [IU] via SUBCUTANEOUS
  Administered 2013-07-01: 5 [IU] via SUBCUTANEOUS
  Administered 2013-07-01: 3 [IU] via SUBCUTANEOUS
  Administered 2013-07-01 – 2013-07-02 (×2): 1 [IU] via SUBCUTANEOUS

## 2013-06-26 MED ORDER — IPRATROPIUM BROMIDE 0.02 % IN SOLN
0.5000 mg | Freq: Four times a day (QID) | RESPIRATORY_TRACT | Status: DC
  Administered 2013-06-26 – 2013-07-02 (×24): 0.5 mg via RESPIRATORY_TRACT
  Filled 2013-06-26 (×24): qty 2.5

## 2013-06-26 MED ORDER — FUROSEMIDE 40 MG PO TABS
40.0000 mg | ORAL_TABLET | Freq: Every day | ORAL | Status: DC
  Administered 2013-06-26: 40 mg via ORAL
  Filled 2013-06-26: qty 1

## 2013-06-26 MED ORDER — INSULIN GLARGINE 100 UNIT/ML ~~LOC~~ SOLN
15.0000 [IU] | Freq: Every day | SUBCUTANEOUS | Status: DC
  Administered 2013-06-26 – 2013-07-01 (×7): 15 [IU] via SUBCUTANEOUS
  Filled 2013-06-26 (×8): qty 0.15

## 2013-06-26 MED ORDER — DILTIAZEM HCL ER COATED BEADS 180 MG PO CP24
360.0000 mg | ORAL_CAPSULE | Freq: Every day | ORAL | Status: DC
  Administered 2013-06-26 – 2013-06-29 (×4): 360 mg via ORAL
  Filled 2013-06-26 (×4): qty 2

## 2013-06-26 MED ORDER — ALBUTEROL SULFATE (5 MG/ML) 0.5% IN NEBU
2.5000 mg | INHALATION_SOLUTION | Freq: Four times a day (QID) | RESPIRATORY_TRACT | Status: DC
  Administered 2013-06-26 – 2013-07-02 (×24): 2.5 mg via RESPIRATORY_TRACT
  Filled 2013-06-26 (×24): qty 0.5

## 2013-06-26 MED ORDER — POLYETHYLENE GLYCOL 3350 17 G PO PACK: 17.0000 g | PACK | Freq: Every day | ORAL | Status: DC | PRN

## 2013-06-26 MED ORDER — ACETAMINOPHEN 500 MG PO TABS: 1000.0000 mg | ORAL_TABLET | Freq: Four times a day (QID) | ORAL | Status: DC | PRN

## 2013-06-26 MED ORDER — PANTOPRAZOLE SODIUM 40 MG PO TBEC
40.0000 mg | DELAYED_RELEASE_TABLET | Freq: Two times a day (BID) | ORAL | Status: DC
  Administered 2013-06-26: 40 mg via ORAL
  Filled 2013-06-26: qty 1

## 2013-06-26 MED ORDER — SALINE SPRAY 0.65 % NA SOLN
NASAL | Status: AC
  Filled 2013-06-26: qty 44

## 2013-06-26 MED ORDER — SODIUM CHLORIDE 0.9 % IJ SOLN
3.0000 mL | Freq: Two times a day (BID) | INTRAMUSCULAR | Status: DC
  Administered 2013-06-26 – 2013-07-01 (×5): 3 mL via INTRAVENOUS

## 2013-06-26 MED ORDER — FLEET ENEMA 7-19 GM/118ML RE ENEM: 1.0000 | ENEMA | Freq: Once | RECTAL | Status: AC | PRN

## 2013-06-26 MED ORDER — HYDROCORTISONE SOD SUCCINATE 100 MG IJ SOLR
50.0000 mg | Freq: Two times a day (BID) | INTRAMUSCULAR | Status: DC
  Administered 2013-06-26 – 2013-07-01 (×12): 50 mg via INTRAVENOUS
  Filled 2013-06-26 (×14): qty 2

## 2013-06-26 MED ORDER — SALINE SPRAY 0.65 % NA SOLN
1.0000 | NASAL | Status: DC | PRN
  Filled 2013-06-26: qty 44

## 2013-06-26 MED ORDER — SODIUM POLYSTYRENE SULFONATE 15 GM/60ML PO SUSP
30.0000 g | Freq: Once | ORAL | Status: AC
  Administered 2013-06-26: 30 g via ORAL
  Filled 2013-06-26: qty 120

## 2013-06-26 NOTE — Progress Notes (Signed)
Called Tana Coast, PA about heme positive stool.  Patient having multiple loose brown stools.  Cdiff negative.  No new orders at this time.  Will continue to monitor patient.  Sch[place], Candelaria Stagers 06/26/2013

## 2013-06-26 NOTE — Care Management Note (Signed)
    Page 1 of 1   07/02/2013     11:54:16 AM   CARE MANAGEMENT NOTE 07/02/2013  Patient:  [name] [name], [name] [name]   Account Number:  [id number]  Date Initiated:  06/26/2013  Documentation initiated by:  Rosemary Holms  Subjective/Objective Assessment:   Pt admitted from home. Just recently dc'd with [facility]. Made DNR status. Per stepdaughter, Dr. [name] will be talking to the family at [facility] tomorrow regarding Goals of Care.     Action/Plan:   Anticipated DC Date:  07/02/2013   Anticipated DC Plan:  HOME W HOME HEALTH SERVICES      DC Planning Services  CM consult      Choice offered to / List presented to:             [facility]  [facility]   Status of service:  Completed, signed off Medicare Important Message given?  YES (If response is "NO", the following Medicare IM given date fields will be blank) Date Medicare IM given:  07/02/2013 Date Additional Medicare IM given:    Discharge Disposition:  HOME W HOME HEALTH SERVICES  Per UR Regulation:    If discussed at Long Length of Stay Meetings, dates discussed:   06/30/2013  07/02/2013    Comments:  07/02/13 Rosemary Holms RN BSN CM Pt Dc with [facility]. O2 tank obtained from [facility] for DC home. Daughter to assist with insulin compliance.  06/26/13 14:30 Xoe Hoe Leanord Hawking RN BSN CM

## 2013-06-26 NOTE — Progress Notes (Signed)
ANTIBIOTIC CONSULT NOTE - INITIAL  Pharmacy Consult for Primaxin Indication: rule out pneumonia  No Known Allergies  Patient Measurements: Height: 5\' 6"  (167.6 cm) Weight: 168 lb 3.4 oz (76.3 kg) IBW/kg (Calculated) : 63.8  Vital Signs: Temp: 97.7 F (36.5 C) (06/27 0649) Temp src: Oral (06/27 0649) BP: 145/83 mmHg (06/27 0649) Pulse Rate: 79 (06/27 0649) Intake/Output from previous day: 06/26 0701 - 06/27 0700 In: 240 [P.O.:240] Out: 300 [Urine:300] Intake/Output from this shift:    Labs:  Recent Labs  06/25/13 1925 06/26/13 0527  WBC 20.1* 14.9*  HGB 9.0* 9.1*  PLT 237 200  CREATININE 1.35 1.55*   Estimated Creatinine Clearance: 37.2 ml/min (by C-G formula based on Cr of 1.55). No results found for this basename: VANCOTROUGH, Leodis Binet, VANCORANDOM, GENTTROUGH, GENTPEAK, GENTRANDOM, TOBRATROUGH, TOBRAPEAK, TOBRARND, AMIKACINPEAK, AMIKACINTROU, AMIKACIN,  in the last 72 hours   Microbiology: Recent Results (from the past 720 hour(s))  CULTURE, BLOOD (ROUTINE X 2)     Status: None   Collection Time    06/07/13  2:55 PM      Result Value Range Status   Specimen Description BLOOD LEFT ARM   Final   Special Requests BOTTLES DRAWN AEROBIC ONLY 6CC   Final   Culture NO GROWTH 7 DAYS   Final   Report Status 06/14/2013 FINAL   Final  CULTURE, BLOOD (ROUTINE X 2)     Status: None   Collection Time    06/07/13  3:20 PM      Result Value Range Status   Specimen Description BLOOD LEFT ARM   Final   Special Requests BOTTLES DRAWN AEROBIC AND ANAEROBIC 6CC   Final   Culture NO GROWTH 7 DAYS   Final   Report Status 06/14/2013 FINAL   Final  MRSA PCR SCREENING     Status: None   Collection Time    06/07/13  8:01 PM      Result Value Range Status   MRSA by PCR NEGATIVE  NEGATIVE Final   Comment:            The GeneXpert MRSA Assay (FDA     approved for NASAL specimens     only), is one component of a     comprehensive MRSA colonization     surveillance program. It  is not     intended to diagnose MRSA     infection nor to guide or     monitor treatment for     MRSA infections.  AFB CULTURE WITH SMEAR     Status: None   Collection Time    06/17/13 11:20 AM      Result Value Range Status   Specimen Description FLUID LEFT PLEURAL COLLECTED BY DOCTOR BOLES   Final   Special Requests IMMUNE:COMPROMISED   Final   ACID FAST SMEAR NO ACID FAST BACILLI SEEN   Final   Culture     Final   Value: CULTURE WILL BE EXAMINED FOR 6 WEEKS BEFORE ISSUING A FINAL REPORT   Report Status PENDING   Incomplete  BODY FLUID CULTURE     Status: None   Collection Time    06/17/13 11:20 AM      Result Value Range Status   Specimen Description FLUID LEFT PLEURAL COLLECTED BY DOCTOR BOLES   Final   Special Requests IMMUNE:COMPROMISED   Final   Gram Stain     Final   Value: RARE WBC PRESENT, PREDOMINANTLY MONONUCLEAR     NO ORGANISMS SEEN  Culture NO GROWTH 3 DAYS   Final   Report Status 06/20/2013 FINAL   Final    Medical History: Past Medical History  Diagnosis Date  . COPD (chronic obstructive pulmonary disease)   . AF (atrial fibrillation)   . Hypertension   . Gout   . Arthritis   . Small bowel obstruction   . Renal insufficiency   . Aortic stenosis   . Hydropneumothorax   . Diabetes mellitus   . CHF (congestive heart failure)   . On home O2     qhs prn  . Pneumonia 05/04/2013    history of pneumonia  . Pneumonia 10/12  . Hydropneumothorax 2012    Medications:  Scheduled:  . sodium chloride   Intravenous STAT  . albuterol  2.5 mg Nebulization Q6H  . antiseptic oral rinse  15 mL Mouth Rinse BID  . diltiazem  360 mg Oral Daily  . fluticasone  1 puff Inhalation BID  . furosemide  80 mg Intravenous Q12H  . hydrocortisone sodium succinate  50 mg Intravenous Q12H  . imipenem-cilastatin  250 mg Intravenous Q6H  . insulin aspart  0-5 Units Subcutaneous QHS  . insulin aspart  0-9 Units Subcutaneous TID WC  . insulin glargine  15 Units Subcutaneous QHS   . ipratropium  0.5 mg Nebulization Q6H  . pantoprazole (PROTONIX) IV  40 mg Intravenous Q24H  . polyethylene glycol  17 g Oral Daily  . sodium chloride  3 mL Intravenous Q12H   Assessment: 77yo male recently completed ABX therapy for HCAP.  Pt readmitted with elevated WBC and failure to thrive.  Empirically starting broad spectrum ABX with Primaxin.  Pt has some renal insufficiency.  Estimated Creatinine Clearance: 37.2 ml/min (by C-G formula based on Cr of 1.55).  Goal of Therapy:  Eradicate infection.  Plan:  Primaxin 250mg  IV q6hrs (renally adjusted) Monitor labs, renal fxn, and cultures  Valrie Hart A 06/26/2013,9:26 AM

## 2013-06-26 NOTE — Clinical Documentation Improvement (Signed)
THIS DOCUMENT IS NOT A PERMANENT PART OF THE MEDICAL RECORD  Please update your documentation with the medical record to reflect your response to this query. If you need help knowing how to do this please call [phone].  06/26/13  Dear [facility]. [name],  In a better effort to capture your patient's severity of illness, reflect appropriate length of stay and utilization of resources, a review of the patient medical record has revealed the following indicators the diagnosis of Heart Failure.   Based on your clinical judgment, please clarify and document in a progress note and/or discharge summary the clinical condition associated with the following supporting information: In responding to this query please exercise your independent judgment.  The fact that a query is asked, does not imply that any particular answer is desired or expected.  Please clarify type and acuity of  CHF. Thank you.  Possible Clinical Conditions?  Chronic Systolic Congestive Heart Failure Chronic Diastolic Congestive Heart Failure Chronic Systolic & Diastolic Congestive Heart Failure Acute Systolic Congestive Heart Failure Acute Diastolic Congestive Heart Failure Acute Systolic & Diastolic Congestive Heart Failure Acute on Chronic Systolic Congestive Heart Failure Acute on Chronic Diastolic Congestive Heart Failure Acute on Chronic Systolic & Diastolic  Congestive Heart Failure Other Condition________________________________________ Cannot Clinically Determine  Supporting Information:  Risk Factors: "readmitted last night with failure to thrive congestive heart failure worsening renal function" History of CHF  Signs & Symptoms: rhonchi bilaterally  Diagnostics: BNP = 2385 EF on 04/20/13 = 55-60% CXR 6/26 = IMPRESSION:  Worsening congestive heart failure and edema. Worsening left  pleural effusion  Treatment: IV Lasix 80mg  q12h  Reviewed: additional documentation in the medical  record  Thank You,  Debora T Williams RN Clinical Documentation Specialist: [phone] Health Information Management [name]

## 2013-06-26 NOTE — Progress Notes (Signed)
CRITICAL VALUE ALERT  Critical value received:  Potassium 6.3  Date of notification:  06/26/2013  Time of notification:  6:10  Critical value read back:yes  Nurse who received alert:  Blair Heys  MD notified (1st page):  Dr. Orvan Falconer  Time of first page:  6:15  MD notified (2nd page): Dr. Orvan Falconer  Time of second page: 6:25   Responding MD:  Dr. Orvan Falconer  Time MD responded:  6:25  MD's pager was not working causing the first page to not be received by the MD. At 6:25 MD was called on cell phone.

## 2013-06-26 NOTE — Consult Note (Signed)
Referring Provider: Fredirick Maudlin, MD Primary Care Physician:  Fredirick Maudlin, MD Primary Gastroenterologist:  Roetta Sessions, MD  Reason for Consultation:  Melena  HPI: [name] [name] is a 77 y.o. male readmitted with concerns for melena, Hemoccult-positive stool. Patient recently had prolonged hospitalization from 06/07/2013 to 06/22/2013 for healthcare associated pneumonia, atrial flutter. He was seen by her practice during that hospitalization for worsening anemia, Hemoccult-positive stool. EGD was performed one week ago and he had an esophageal stricture easily traversed by the scope, no dilation, multiple small gastric ulcers with evidence of old blood in the stomach, gastritis (path showed Candida but no evidence of invasive disease). Colonoscopy showed small internal hemorrhoids and diverticulosis.  He presented back yesterday after home health aid noticed black stools when checking for impaction. He had not had BM in several days. In the emergency department he had black Hemoccult-positive stools on digital rectal exam. His hemoglobin was unchanged however. Other issues include increasing creatinine and potassium level. White blood cell count of 20,000 on admission, down to 14,900 today. His BUN and CO2 is high as well. Chest x-ray yesterday showed worsening congestive heart failure and edema, worsening left pleural effusion. He had 1480 cc of fluid removed from left thoracentesis on 06/17/2013.  Patient has been evaluated by both cardiology and Dr. [name] this AM. It is felt that his overall prognosis is poor. He received a dose of Kayexalate around 6:30 this morning. He has a large brown liquid stool at this time (incontinent). Patient is less alert. He denies abdominal pain. No vomiting or abdominal distention.   Prior to Admission medications   Medication Sig Start Date End Date Taking? Authorizing Provider  aspirin 81 MG chewable tablet Chew 81 mg by mouth daily.   Yes Historical  Provider, MD  diltiazem (CARDIZEM CD) 360 MG 24 hr capsule Take 1 capsule (360 mg total) by mouth daily. 06/22/13  Yes Fredirick Maudlin, MD  fluticasone (FLOVENT HFA) 110 MCG/ACT inhaler Inhale 1 puff into the lungs 2 (two) times daily.   Yes Historical Provider, MD  furosemide (LASIX) 40 MG tablet Take 1 tablet (40 mg total) by mouth daily. 06/22/13  Yes Fredirick Maudlin, MD  glyBURIDE (DIABETA) 5 MG tablet Take 1 tablet (5 mg total) by mouth 2 (two) times daily with a meal. 05/26/13  Yes Storm Frisk, MD  insulin aspart (NOVOLOG) 100 UNIT/ML injection Inject 3-15 Units into the skin 3 (three) times daily with meals. Sliding scale as follows: 121-150=3 units 151-200=4 units 201-250=7 units 251-300=9 units 301-350=12 units 351-400=15 units   Yes Historical Provider, MD  insulin glargine (LANTUS) 100 UNIT/ML injection Inject 0.1 mLs (10 Units total) into the skin daily. 05/26/13  Yes Storm Frisk, MD  ipratropium-albuterol (DUONEB) 0.5-2.5 (3) MG/3ML SOLN Take 3 mLs by nebulization 4 (four) times daily.   Yes Historical Provider, MD  pantoprazole (PROTONIX) 40 MG tablet Take 1 tablet (40 mg total) by mouth 2 (two) times daily before a meal. 06/22/13  Yes Fredirick Maudlin, MD  polyethylene glycol (MIRALAX / GLYCOLAX) packet Take 17 g by mouth daily.   Yes Historical Provider, MD  potassium chloride SA (K-DUR,KLOR-CON) 20 MEQ tablet Take 20 mEq by mouth daily.   Yes Historical Provider, MD  predniSONE (DELTASONE) 10 MG tablet Take 1 tablet (10 mg total) by mouth daily with breakfast. 06/22/13  Yes Fredirick Maudlin, MD  acetaminophen (TYLENOL) 500 MG tablet Take 1,000 mg by mouth every 6 (six) hours as needed.  Pain    Historical Provider, MD  magnesium hydroxide (MILK OF MAGNESIA) 400 MG/5ML suspension Take 30 mLs by mouth daily as needed for constipation.    Historical Provider, MD  sodium chloride (OCEAN) 0.65 % SOLN nasal spray Place 1 spray into the nose as needed for congestion. 05/26/13    Storm Frisk, MD    Current Facility-Administered Medications  Medication Dose Route Frequency Provider Last Rate Last Dose  . 0.9 %  sodium chloride infusion   Intravenous STAT Vida Roller, MD 10 mL/hr at 06/25/13 2324 10 mL/hr at 06/25/13 2324  . acetaminophen (TYLENOL) tablet 1,000 mg  1,000 mg Oral Q6H PRN Vania Rea, MD      . albuterol (PROVENTIL) (5 MG/ML) 0.5% nebulizer solution 2.5 mg  2.5 mg Nebulization Q6H Vania Rea, MD   2.5 mg at 06/26/13 0739  . antiseptic oral rinse (BIOTENE) solution 15 mL  15 mL Mouth Rinse BID Vania Rea, MD      . diltiazem (CARDIZEM CD) 24 hr capsule 360 mg  360 mg Oral Daily Vania Rea, MD      . fluticasone (FLOVENT HFA) 110 MCG/ACT inhaler 1 puff  1 puff Inhalation BID Vania Rea, MD   1 puff at 06/26/13 0739  . furosemide (LASIX) tablet 40 mg  40 mg Oral Daily Vania Rea, MD      . insulin aspart (novoLOG) injection 0-5 Units  0-5 Units Subcutaneous QHS Vania Rea, MD   3 Units at 06/26/13 0057  . insulin aspart (novoLOG) injection 0-9 Units  0-9 Units Subcutaneous TID WC Vania Rea, MD      . insulin glargine (LANTUS) injection 15 Units  15 Units Subcutaneous QHS Vania Rea, MD   15 Units at 06/26/13 0057  . ipratropium (ATROVENT) nebulizer solution 0.5 mg  0.5 mg Nebulization Q6H Vania Rea, MD   0.5 mg at 06/26/13 0739  . ondansetron (ZOFRAN) injection 4 mg  4 mg Intravenous Q4H PRN Vania Rea, MD      . pantoprazole (PROTONIX) EC tablet 40 mg  40 mg Oral BID AC Vania Rea, MD      . polyethylene glycol (MIRALAX / GLYCOLAX) packet 17 g  17 g Oral Daily Vania Rea, MD      . predniSONE (DELTASONE) tablet 10 mg  10 mg Oral Q breakfast Vania Rea, MD      . sodium chloride (OCEAN) 0.65 % nasal spray 1 spray  1 spray Nasal PRN Vania Rea, MD      . sodium chloride 0.9 % injection 3 mL  3 mL Intravenous Q12H Vania Rea, MD      . sodium phosphate  (FLEET) 7-19 GM/118ML enema 1 enema  1 enema Rectal Once PRN Vania Rea, MD       Facility-Administered Medications Ordered in Other Encounters  Medication Dose Route Frequency Provider Last Rate Last Dose  . neomycin-polymyxin-dexameth (MAXITROL) 0.1 % ophth ointment    PRN Gemma Payor, MD   1 application at 10/27/12 4540    Allergies as of 06/25/2013  . (No Known Allergies)    Past Medical History  Diagnosis Date  . COPD (chronic obstructive pulmonary disease)   . AF (atrial fibrillation)   . Hypertension   . Gout   . Arthritis   . Small bowel obstruction   . Renal insufficiency   . Aortic stenosis   . Hydropneumothorax   . Diabetes mellitus   . CHF (congestive heart failure)   . On home O2  qhs prn  . Pneumonia 05/04/2013    history of pneumonia  . Pneumonia 10/12  . Hydropneumothorax 2012    Past Surgical History  Procedure Laterality Date  . Rotator cuff repair      left and right  . Cardiac valve replacement      aortic valve with a tissue prosthesis and left sided maze proc  . S/p rewiring of sternum for dehiscence    . Gallbladder surgery    . US echocardiography  01-03-11    EF 55-60%  . Cardiovascular stress test  01-26-09    EF 67%  . Coronary angioplasty    . Cholecystectomy    . Sternal incision reclosure  08/29/2010    sternal rewiring  . Cataract extraction w/phaco  10/02/2012    Procedure: CATARACT EXTRACTION PHACO AND INTRAOCULAR LENS PLACEMENT (IOC);  Surgeon: Gemma Payor, MD;  Location: [facility];  Service: Ophthalmology;  Laterality: Right;  CDE 16.68  . Cataract extraction w/phaco  10/27/2012    Procedure: CATARACT EXTRACTION PHACO AND INTRAOCULAR LENS PLACEMENT (IOC);  Surgeon: Gemma Payor, MD;  Location: [facility];  Service: Ophthalmology;  Laterality: Left;  CDE:16.89  . Artificial aortic valve    . Colonoscopy N/A 06/19/2013    Dr. Jennell Corner internal hemorrhoids, diverticulosis  . Esophagogastroduodenoscopy N/A 06/19/2013    Dr.  Yong Channel stricture passed by scope, multiple small gastric ulcers with evidence of old blood in the stomach, gastritis. Biopsy was negative for H. pylori. There was Candida species and possible colonization of the ulcer.    Family History  Problem Relation Age of Onset  . Hypertension Mother   . Stroke Mother   . Heart disease Sister   . Kidney disease Sister     History   Social History  . Marital Status: Married    Spouse Name: N/A    Number of Children: N/A  . Years of Education: N/A   Occupational History  . Not on file.   Social History Main Topics  . Smoking status: Former Smoker    Types: Cigarettes    Quit date: 01/18/2005  . Smokeless tobacco: Former Neurosurgeon  . Alcohol Use: No  . Drug Use: No  . Sexually Active: Yes    Birth Control/ Protection: None   Other Topics Concern  . Not on file   Social History Narrative   [place] in [place]   As a younger man at [employer]     ROS: Difficult to obtain due to acute illness. SOB, lethargy.   Physical Examination: Vital signs in last 24 hours: Temp:  [97.7 F (36.5 C)-98.2 F (36.8 C)] 97.7 F (36.5 C) (06/27 0649) Pulse Rate:  [49-84] 79 (06/27 0649) Resp:  [16-24] 21 (06/27 0649) BP: (105-146)/(57-83) 145/83 mmHg (06/27 0649) SpO2:  [89 %-100 %] 94 % (06/27 0739) FiO2 (%):  [36 %] 36 % (06/26 2315) Weight:  [168 lb 3.4 oz (76.3 kg)] 168 lb 3.4 oz (76.3 kg) (06/26 2315) Last BM Date: 06/20/13  General: Chronically ill-appearing WM. Wife, daughter at bedside. Patient answers with one or two words. Appears comfortable.  Head: Normocephalic, atraumatic.   Eyes: Conjunctiva pink, no icterus. Mouth: Oropharyngeal mucosa moist and pink , no lesions erythema or exudate. Neck: Supple without thyromegaly, masses, or lymphadenopathy.   Abdomen: Bowel sounds are normal, nontender, nondistended.  Rectal: liquid light brown stool Extremities: No clubbing, deformity. +edema bilaterally Neuro: Alert  and oriented x 4 , grossly normal neurologically.  Skin: Warm and dry, no  rash or jaundice.   Psych: Drowsy.        Intake/Output from previous day: 06/26 0701 - 06/27 0700 In: 240 [P.O.:240] Out: 300 [Urine:300] Intake/Output this shift:    Lab Results: CBC  Recent Labs  06/25/13 1925 06/26/13 0527  WBC 20.1* 14.9*  HGB 9.0* 9.1*  HCT 30.4* 30.3*  MCV 97.7 97.1  PLT 237 200   BMET  Recent Labs  06/25/13 1925 06/26/13 0527  NA 136 138  K 5.7* 6.3*  CL 89* 95*  CO2 42* 42*  GLUCOSE 356* 187*  BUN 47* 47*  CREATININE 1.35 1.55*  CALCIUM 9.0 9.2   LFT  Recent Labs  06/25/13 1925  BILITOT 1.9*  ALKPHOS 85  AST 14  ALT 19  PROT 7.6  ALBUMIN 3.0*     PT/INR  Recent Labs  06/25/13 1925  LABPROT 14.2  INR 1.12      Imaging Studies: Dg Chest Port 1 View  06/25/2013   *RADIOLOGY REPORT*  Clinical Data: Short of breath  PORTABLE CHEST - 1 VIEW  Comparison: 06/17/2013  Findings: Worsening congestive heart failure and edema.  Diffuse bilateral edema.  Increasing left effusion and left lower lobe atelectasis.  IMPRESSION: Worsening congestive heart failure and edema.  Worsening left pleural effusion   Original Report Authenticated By: Janeece Riggers, M.D.         Impression: 77 year old gentleman with recent prolonged hospitalization for healthcare acquired pneumonia, left pleural effusion requiring thoracentesis, anemia/Hemoccult positive stool noted had gastric ulcers on recent endoscopy, recent anticoagulation with need for chronic therapy (A. fib/flutter) who presents back with complaints of black stool noted on rectal exam by home health aid. Overall his hemoglobin has been stable. His BUN and creatinine have been going up along with potassium more indicative of renal failure rather than upper GI bleed. Question black stool from prior bleeding given history of gastric ulcer. Currently with loose stools status post Kayexalate this morning. No melena today. Given  recent prolonged hospitalization along with antibiotic therapy, we will check C. difficile PCR.  Plan: 1. No EGD necessary at this time. Patient is not fit from cardiopulmonary standpoint. Will monitor H/H closely.  2. Continue IV PPI. 3. C.Diff PCR.  4. Change Miralax to prn.    LOS: 1 day   Tana Coast  06/26/2013, 8:01 AM

## 2013-06-26 NOTE — Progress Notes (Addendum)
Subjective: He was readmitted last night with failure to thrive congestive heart failure worsening renal function. He's had constipation and some question of melena. His wife says that his oxygen came off for a short period yesterday and he dropped his O2 saturation in the 50s and his heart rate into the 50s and became very confused and less responsive.  Objective: Vital sig ns in last 24 hours: Temp:  [97.7 F (36.5 C)-98.2 F (36.8 C)] 97.7 F (36.5 C) (06/27 0649) Pulse Rate:  [49-84] 79 (06/27 0649) Resp:  [16-24] 21 (06/27 0649) BP: (105-146)/(57-83) 145/83 mmHg (06/27 0649) SpO2:  [89 %-100 %] 94 % (06/27 0739) FiO2 (%):  [36 %] 36 % (06/26 2315) Weight:  [76.3 kg (168 lb 3.4 oz)] 76.3 kg (168 lb 3.4 oz) (06/26 2315) Weight change:  Last BM Date: 06/20/13  Intake/Output from previous day: 06/26 0701 - 06/27 0700 In: 240 [P.O.:240] Out: 300 [Urine:300]  PHYSICAL EXAM General appearance: He is sleepy but arousable. He looks chronically ill. Resp: rhonchi bilaterally Cardio: irregularly irregular rhythm GI: Mildly diffusely tender Extremities: Edema of his feet  Lab Results:    Basic Metabolic Panel:  Recent Labs  16/10/96 1925 06/26/13 0527  NA 136 138  K 5.7* 6.3*  CL 89* 95*  CO2 42* 42*  GLUCOSE 356* 187*  BUN 47* 47*  CREATININE 1.35 1.55*  CALCIUM 9.0 9.2   Liver Function Tests:  Recent Labs  06/25/13 1925  AST 14  ALT 19  ALKPHOS 85  BILITOT 1.9*  PROT 7.6  ALBUMIN 3.0*   No results found for this basename: LIPASE, AMYLASE,  in the last 72 hours No results found for this basename: AMMONIA,  in the last 72 hours CBC:  Recent Labs  06/25/13 1925 06/26/13 0527  WBC 20.1* 14.9*  NEUTROABS 17.3*  --   HGB 9.0* 9.1*  HCT 30.4* 30.3*  MCV 97.7 97.1  PLT 237 200   Cardiac Enzymes: No results found for this basename: CKTOTAL, CKMB, CKMBINDEX, TROPONINI,  in the last 72 hours BNP: No results found for this basename: PROBNP,  in the last  72 hours D-Dimer: No results found for this basename: DDIMER,  in the last 72 hours CBG:  Recent Labs  06/25/13 2350 06/26/13 0045 06/26/13 0741  GLUCAP 227* 265* 140*   Hemoglobin A1C: No results found for this basename: HGBA1C,  in the last 72 hours Fasting Lipid Panel: No results found for this basename: CHOL, HDL, LDLCALC, TRIG, CHOLHDL, LDLDIRECT,  in the last 72 hours Thyroid Function Tests: No results found for this basename: TSH, T4TOTAL, FREET4, T3FREE, THYROIDAB,  in the last 72 hours Anemia Panel: No results found for this basename: VITAMINB12, FOLATE, FERRITIN, TIBC, IRON, RETICCTPCT,  in the last 72 hours Coagulation:  Recent Labs  06/25/13 1925  LABPROT 14.2  INR 1.12   Urine Drug Screen: Drugs of Abuse  No results found for this basename: labopia, cocainscrnur, labbenz, amphetmu, thcu, labbarb    Alcohol Level: No results found for this basename: ETH,  in the last 72 hours Urinalysis: No results found for this basename: COLORURINE, APPERANCEUR, LABSPEC, PHURINE, GLUCOSEU, HGBUR, BILIRUBINUR, KETONESUR, PROTEINUR, UROBILINOGEN, NITRITE, LEUKOCYTESUR,  in the last 72 hours Misc. Labs:  ABGS  Recent Labs  06/25/13 2120  PHART 7.387  PO2ART 105.0*  TCO2 38.1  HCO3 41.3*   CULTURES Recent Results (from the past 240 hour(s))  AFB CULTURE WITH SMEAR     Status: None   Collection Time  06/17/13 11:20 AM      Result Value Range Status   Specimen Description FLUID LEFT PLEURAL COLLECTED BY DOCTOR BOLES   Final   Special Requests IMMUNE:COMPROMISED   Final   ACID FAST SMEAR NO ACID FAST BACILLI SEEN   Final   Culture     Final   Value: CULTURE WILL BE EXAMINED FOR 6 WEEKS BEFORE ISSUING A FINAL REPORT   Report Status PENDING   Incomplete  BODY FLUID CULTURE     Status: None   Collection Time    06/17/13 11:20 AM      Result Value Range Status   Specimen Description FLUID LEFT PLEURAL COLLECTED BY DOCTOR BOLES   Final   Special Requests  IMMUNE:COMPROMISED   Final   Gram Stain     Final   Value: RARE WBC PRESENT, PREDOMINANTLY MONONUCLEAR     NO ORGANISMS SEEN   Culture NO GROWTH 3 DAYS   Final   Report Status 06/20/2013 FINAL   Final   Studies/Results: Dg Chest Port 1 View  06/25/2013   *RADIOLOGY REPORT*  Clinical Data: Short of breath  PORTABLE CHEST - 1 VIEW  Comparison: 06/17/2013  Findings: Worsening congestive heart failure and edema.  Diffuse bilateral edema.  Increasing left effusion and left lower lobe atelectasis.  IMPRESSION: Worsening congestive heart failure and edema.  Worsening left pleural effusion   Original Report Authenticated By: Janeece Riggers, M.D.    Medications:  Prior to Admission:  Prescriptions prior to admission  Medication Sig Dispense Refill  . aspirin 81 MG chewable tablet Chew 81 mg by mouth daily.      Marland Kitchen diltiazem (CARDIZEM CD) 360 MG 24 hr capsule Take 1 capsule (360 mg total) by mouth daily.  30 capsule  12  . fluticasone (FLOVENT HFA) 110 MCG/ACT inhaler Inhale 1 puff into the lungs 2 (two) times daily.      . furosemide (LASIX) 40 MG tablet Take 1 tablet (40 mg total) by mouth daily.  30 tablet  12  . glyBURIDE (DIABETA) 5 MG tablet Take 1 tablet (5 mg total) by mouth 2 (two) times daily with a meal.      . insulin aspart (NOVOLOG) 100 UNIT/ML injection Inject 3-15 Units into the skin 3 (three) times daily with meals. Sliding scale as follows: 121-150=3 units 151-200=4 units 201-250=7 units 251-300=9 units 301-350=12 units 351-400=15 units      . insulin glargine (LANTUS) 100 UNIT/ML injection Inject 0.1 mLs (10 Units total) into the skin daily.  10 mL  12  . ipratropium-albuterol (DUONEB) 0.5-2.5 (3) MG/3ML SOLN Take 3 mLs by nebulization 4 (four) times daily.      . pantoprazole (PROTONIX) 40 MG tablet Take 1 tablet (40 mg total) by mouth 2 (two) times daily before a meal.  60 tablet  12  . polyethylene glycol (MIRALAX / GLYCOLAX) packet Take 17 g by mouth daily.      . potassium  chloride SA (K-DUR,KLOR-CON) 20 MEQ tablet Take 20 mEq by mouth daily.      . predniSONE (DELTASONE) 10 MG tablet Take 1 tablet (10 mg total) by mouth daily with breakfast.  42 tablet  0  . acetaminophen (TYLENOL) 500 MG tablet Take 1,000 mg by mouth every 6 (six) hours as needed. Pain      . magnesium hydroxide (MILK OF MAGNESIA) 400 MG/5ML suspension Take 30 mLs by mouth daily as needed for constipation.      . sodium chloride (OCEAN) 0.65 % SOLN  nasal spray Place 1 spray into the nose as needed for congestion.    0   Scheduled: . sodium chloride   Intravenous STAT  . albuterol  2.5 mg Nebulization Q6H  . antiseptic oral rinse  15 mL Mouth Rinse BID  . diltiazem  360 mg Oral Daily  . fluticasone  1 puff Inhalation BID  . furosemide  40 mg Oral Daily  . insulin aspart  0-5 Units Subcutaneous QHS  . insulin aspart  0-9 Units Subcutaneous TID WC  . insulin glargine  15 Units Subcutaneous QHS  . ipratropium  0.5 mg Nebulization Q6H  . pantoprazole  40 mg Oral BID AC  . polyethylene glycol  17 g Oral Daily  . predniSONE  10 mg Oral Q breakfast  . sodium chloride  3 mL Intravenous Q12H   Continuous:  [id number], ondansetron (ZOFRAN) IV, sodium chloride, sodium phosphate  Assesment: For his chest x-ray looks like congestive heart failure. His renal function is worse. His potassium is elevated. He was said to have black stool but it appears that that was more of a greenish discoloration. He does have peptic ulcer disease. He has a recurrent pleural effusion. He has failure to thrive at home with the best efforts of his family and home health. I discussed all this with his family and told them that I thought that we should treat him aggressively but that he should have no CODE BLUE status. He probably will need to have another thoracentesis but I don't think he can have that now I have asked for GI and cardiology consultation he'll need Kayexalate. He will need diuresis. I do not think that  the current findings on chest x-ray or pneumonia although he does have an elevated white blood cell count at 20,000 outpatient renal function and potassium ok two days ago Active Problems:   COPD (chronic obstructive pulmonary disease)   Type 2 diabetes mellitus, uncontrolled   Anemia, normocytic normochromic   Pleural effusion   Black stool   PUD (peptic ulcer disease)    Plan: he'll have Kayexalate. He'll have consultations as above. Because of the diagnostic uncertainty I will have him start on IV antibiotics. Affected his pleural effusion has reaccumulated so quickly is concerning that he may need something more than simple thoracentesis and may need something like a pleurodysis or semipermanent drain. I'm not sure that he would be able to have that done now because of his poor overall status    LOS: 1 day   Kortnie Stovall L 06/26/2013, 8:53 AM

## 2013-06-26 NOTE — Clinical Documentation Improvement (Signed)
THIS DOCUMENT IS NOT A PERMANENT PART OF THE MEDICAL RECORD  Please update your documentation within the medical record to reflect your response to this query. If you need help knowing how to do this please call [phone].  06/26/13  Dear Dr.  [name] Marton Redwood  In a better effort to capture your patient's severity of illness, reflect appropriate length of stay and utilization of resources, a review of the medical record has revealed the following indicators.   Based on your clinical judgment, please clarify and document in a progress note and/or discharge summary the clinical condition associated with the following supporting information: In responding to this query please exercise your independent judgment.  The fact that a query is asked, does not imply that any particular answer is desired or expected. Abnormal findings (laboratory, x-ray, pathologic, and other diagnostic results) are not coded and reported unless the physician indicates their clinical significance.   The medical record reflects the following clinical findings, please clarify the diagnostic and/or clinical significance:      Possible Clinical Conditions?                                 Supporting Information:  Hyperkalemia             Lab Test: Potassium  Other Condition___________________                 Normal Values :   3.5-5.1  Cannot Clinically Determine_________                 Patient Values: 6/26 = 5.7; 6/27 = 6.3 & 5.5_                       Treatment: Kayexalate 30g for 1 dose                      Reviewed: additional documentation in the medical record   Thank Angelene Giovanni RN  Clinical Documentation Specialist: [phone] Health Information Management [place]

## 2013-06-26 NOTE — Consult Note (Signed)
REVIEWED.  NO SIGNIFICANT DROP IN HB. HEME POS STOLLS MAY BE FROM BIOPSY. DOUBT LARGE VOLUME UGIB. PT NOT A CANDIDATE FOR CONSCIOUS SEDATION DUE TO CARDIOPULMONARY ISSUES.  No INPATIENT GI COVERAGE from 5 pm JUN 27 to 0730 JUN 30.

## 2013-06-26 NOTE — Progress Notes (Signed)
Patient ID: [name] [name], male   DOB: [birth year], 77 y.o.   MRN: [id number] I have reviewed the chart and examined Mr. Odriscoll per Dr. [name] request. He presents 4 days after discharge with failure to thrive, hypoxemia, and return if his pleural effusion. He does have thoracentesis last week.  His potassium is 6.8 and his renal function has deteriorated. Prognosis is poor and I agree with making him a DO NOT RESUSCITATE status. There is really nothing more to offer from a cardiac standpoint. I discussed with the patient's family.  He will be given Kayexalate to lower his potassium. That we made to diuresis without worsening his renal function.

## 2013-06-26 NOTE — Progress Notes (Signed)
Dr. [name] called because family stated they were to have a family meeting with him at some point today but did not know what time.  Dr. [name] and family agreed that they would all meet tomorrow morning at 9am instead of today due to one daughter that was out of town at this time. [place], Candelaria Stagers 06/26/2013

## 2013-06-26 NOTE — Progress Notes (Signed)
CRITICAL VALUE ALERT  Critical value received:  CO2 42  Date of notification:  06/26/2013  Time of notification:  6:10  Critical value read back:yes  Nurse who received alert:  Blair Heys  MD notified (1st page): Dr. Orvan Falconer  Time of first page:  6:15  MD notified (2nd page): Dr. Orvan Falconer  Time of second page: 6:25  Responding MD:  Dr. Orvan Falconer  Time MD responded:  6:25  MD's pager isn't working causing the first page to not be received by the MD. MD's cell phone was called at 6:25.

## 2013-06-26 NOTE — Progress Notes (Signed)
UR Chart Review Completed  

## 2013-06-27 LAB — CBC WITH DIFFERENTIAL/PLATELET
Basophils Relative: 0 % (ref 0–1)
Eosinophils Absolute: 0 10*3/uL (ref 0.0–0.7)
Hemoglobin: 8.2 g/dL — ABNORMAL LOW (ref 13.0–17.0)
MCH: 28.8 pg (ref 26.0–34.0)
MCHC: 29.9 g/dL — ABNORMAL LOW (ref 30.0–36.0)
Monocytes Absolute: 1.6 10*3/uL — ABNORMAL HIGH (ref 0.1–1.0)
Neutro Abs: 7.3 10*3/uL (ref 1.7–7.7)
Neutrophils Relative %: 78 % — ABNORMAL HIGH (ref 43–77)

## 2013-06-27 LAB — BASIC METABOLIC PANEL
CO2: >45 mEq/L (ref 19–32)
Calcium: 8.5 mg/dL (ref 8.4–10.5)
Creatinine, Ser: 1.38 mg/dL — ABNORMAL HIGH (ref 0.50–1.35)
Glucose, Bld: 128 mg/dL — ABNORMAL HIGH (ref 70–99)

## 2013-06-27 LAB — GLUCOSE, CAPILLARY
Glucose-Capillary: 109 mg/dL — ABNORMAL HIGH (ref 70–99)
Glucose-Capillary: 150 mg/dL — ABNORMAL HIGH (ref 70–99)
Glucose-Capillary: 249 mg/dL — ABNORMAL HIGH (ref 70–99)

## 2013-06-27 MED ORDER — MORPHINE SULFATE 2 MG/ML IJ SOLN: 2.0000 mg | INTRAMUSCULAR | Status: DC | PRN

## 2013-06-27 MED ORDER — ALBUTEROL SULFATE (5 MG/ML) 0.5% IN NEBU
2.5000 mg | INHALATION_SOLUTION | RESPIRATORY_TRACT | Status: DC | PRN
  Administered 2013-06-28 – 2013-06-29 (×2): 2.5 mg via RESPIRATORY_TRACT
  Filled 2013-06-27 (×2): qty 0.5

## 2013-06-28 ENCOUNTER — Inpatient Hospital Stay (HOSPITAL_COMMUNITY): Payer: Medicare Other

## 2013-06-28 DIAGNOSIS — E876 Hypokalemia: Secondary | ICD-10-CM | POA: Diagnosis not present

## 2013-06-28 DIAGNOSIS — E875 Hyperkalemia: Secondary | ICD-10-CM | POA: Diagnosis present

## 2013-06-28 LAB — CBC WITH DIFFERENTIAL/PLATELET
Basophils Absolute: 0 10*3/uL (ref 0.0–0.1)
Eosinophils Absolute: 0 10*3/uL (ref 0.0–0.7)
Lymphocytes Relative: 16 % (ref 12–46)
MCHC: 31.1 g/dL (ref 30.0–36.0)
Monocytes Absolute: 4.5 10*3/uL — ABNORMAL HIGH (ref 0.1–1.0)
Neutro Abs: 10.5 10*3/uL — ABNORMAL HIGH (ref 1.7–7.7)
Neutrophils Relative %: 59 % (ref 43–77)
RDW: 20.3 % — ABNORMAL HIGH (ref 11.5–15.5)

## 2013-06-28 LAB — BASIC METABOLIC PANEL
BUN: 38 mg/dL — ABNORMAL HIGH (ref 6–23)
Creatinine, Ser: 1.38 mg/dL — ABNORMAL HIGH (ref 0.50–1.35)
GFR calc Af Amer: 56 mL/min — ABNORMAL LOW (ref >90)
GFR calc non Af Amer: 48 mL/min — ABNORMAL LOW (ref >90)

## 2013-06-28 LAB — GLUCOSE, CAPILLARY
Glucose-Capillary: 49 mg/dL — ABNORMAL LOW (ref 70–99)
Glucose-Capillary: 127 mg/dL — ABNORMAL HIGH (ref 70–99)
Glucose-Capillary: 200 mg/dL — ABNORMAL HIGH (ref 70–99)

## 2013-06-28 MED ORDER — CAMPHOR-MENTHOL 0.5-0.5 % EX LOTN
TOPICAL_LOTION | CUTANEOUS | Status: DC | PRN
  Filled 2013-06-28: qty 222

## 2013-06-28 MED ORDER — POTASSIUM CHLORIDE 10 MEQ/100ML IV SOLN
10.0000 meq | INTRAVENOUS | Status: AC
  Administered 2013-06-28 (×4): 10 meq via INTRAVENOUS
  Filled 2013-06-28 (×2): qty 200

## 2013-06-28 MED ORDER — POTASSIUM CHLORIDE CRYS ER 20 MEQ PO TBCR
20.0000 meq | EXTENDED_RELEASE_TABLET | Freq: Once | ORAL | Status: AC
  Administered 2013-06-28: 20 meq via ORAL
  Filled 2013-06-28: qty 1

## 2013-06-28 NOTE — Progress Notes (Signed)
Subjective: He feels better. He has no new complaints. His breathing is okay. He has some inflammatory change in his left flank. He is coughing up some sputum. He has no chest pain.  Objective: Vital signs in last 24 hours: Temp:  [97.4 F (36.3 C)-98.4 F (36.9 C)] 97.4 F (36.3 C) (06/29 0503) Pulse Rate:  [92-107] 107 (06/29 0503) Resp:  [20] 20 (06/29 0503) BP: (105-128)/(59-79) 117/60 mmHg (06/29 0503) SpO2:  [94 %-99 %] 94 % (06/29 0719) Weight:  [73.6 kg (162 lb 4.1 oz)] 73.6 kg (162 lb 4.1 oz) (06/29 0503) Weight change: 0.6 kg (1 lb 5.2 oz) Last BM Date: 06/26/13  Intake/Output from previous day: 06/28 0701 - 06/29 0700 In: 760 [P.O.:360; IV Piggyback:400] Out: 3200 [Urine:3200]  PHYSICAL EXAM General appearance: alert, cooperative and mild distress Resp: clear to auscultation bilaterally Cardio: irregularly irregular rhythm GI: soft, non-tender; bowel sounds normal; no masses,  no organomegaly Extremities: extremities normal, atraumatic, no cyanosis or edema  Lab Results:    Basic Metabolic Panel:  Recent Labs  29/56/21 0601 06/28/13 0559  NA 142 142  K 4.1 2.4*  CL 92* 90*  CO2 >45* >45*  GLUCOSE 128* 54*  BUN 38* 38*  CREATININE 1.38* 1.38*  CALCIUM 8.5 8.5   Liver Function Tests:  Recent Labs  06/25/13 1925  AST 14  ALT 19  ALKPHOS 85  BILITOT 1.9*  PROT 7.6  ALBUMIN 3.0*   No results found for this basename: LIPASE, AMYLASE,  in the last 72 hours No results found for this basename: AMMONIA,  in the last 72 hours CBC:  Recent Labs  06/27/13 0601 06/28/13 0559  WBC 9.4 17.9*  NEUTROABS 7.3 10.5*  HGB 8.2* 9.2*  HCT 27.4* 29.6*  MCV 96.1 94.6  PLT 150 142*   Cardiac Enzymes: No results found for this basename: CKTOTAL, CKMB, CKMBINDEX, TROPONINI,  in the last 72 hours BNP:  Recent Labs  06/26/13 0527  PROBNP 2385.0*   D-Dimer: No results found for this basename: DDIMER,  in the last 72 hours CBG:  Recent Labs  06/27/13 0712 06/27/13 1156 06/27/13 1642 06/27/13 2130 06/28/13 0711 06/28/13 0751  GLUCAP 109* 150* 249* 240* 49* 127*   Hemoglobin A1C: No results found for this basename: HGBA1C,  in the last 72 hours Fasting Lipid Panel: No results found for this basename: CHOL, HDL, LDLCALC, TRIG, CHOLHDL, LDLDIRECT,  in the last 72 hours Thyroid Function Tests: No results found for this basename: TSH, T4TOTAL, FREET4, T3FREE, THYROIDAB,  in the last 72 hours Anemia Panel: No results found for this basename: VITAMINB12, FOLATE, FERRITIN, TIBC, IRON, RETICCTPCT,  in the last 72 hours Coagulation:  Recent Labs  06/25/13 1925  LABPROT 14.2  INR 1.12   Urine Drug Screen: Drugs of Abuse  No results found for this basename: labopia, cocainscrnur, labbenz, amphetmu, thcu, labbarb    Alcohol Level: No results found for this basename: ETH,  in the last 72 hours Urinalysis: No results found for this basename: COLORURINE, APPERANCEUR, LABSPEC, PHURINE, GLUCOSEU, HGBUR, BILIRUBINUR, KETONESUR, PROTEINUR, UROBILINOGEN, NITRITE, LEUKOCYTESUR,  in the last 72 hours Misc. Labs:  ABGS  Recent Labs  06/25/13 2120  PHART 7.387  PO2ART 105.0*  TCO2 38.1  HCO3 41.3*   CULTURES Recent Results (from the past 240 hour(s))  CLOSTRIDIUM DIFFICILE BY PCR     Status: None   Collection Time    06/26/13  9:28 AM      Result Value Range Status   C  difficile by pcr NEGATIVE  NEGATIVE Final   Studies/Results: No results found.  Medications:  Prior to Admission:  Prescriptions prior to admission  Medication Sig Dispense Refill  . aspirin 81 MG chewable tablet Chew 81 mg by mouth daily.      Marland Kitchen diltiazem (CARDIZEM CD) 360 MG 24 hr capsule Take 1 capsule (360 mg total) by mouth daily.  30 capsule  12  . fluticasone (FLOVENT HFA) 110 MCG/ACT inhaler Inhale 1 puff into the lungs 2 (two) times daily.      . furosemide (LASIX) 40 MG tablet Take 1 tablet (40 mg total) by mouth daily.  30 tablet  12  .  glyBURIDE (DIABETA) 5 MG tablet Take 1 tablet (5 mg total) by mouth 2 (two) times daily with a meal.      . insulin aspart (NOVOLOG) 100 UNIT/ML injection Inject 3-15 Units into the skin 3 (three) times daily with meals. Sliding scale as follows: 121-150=3 units 151-200=4 units 201-250=7 units 251-300=9 units 301-350=12 units 351-400=15 units      . insulin glargine (LANTUS) 100 UNIT/ML injection Inject 0.1 mLs (10 Units total) into the skin daily.  10 mL  12  . ipratropium-albuterol (DUONEB) 0.5-2.5 (3) MG/3ML SOLN Take 3 mLs by nebulization 4 (four) times daily.      . pantoprazole (PROTONIX) 40 MG tablet Take 1 tablet (40 mg total) by mouth 2 (two) times daily before a meal.  60 tablet  12  . polyethylene glycol (MIRALAX / GLYCOLAX) packet Take 17 g by mouth daily.      . potassium chloride SA (K-DUR,KLOR-CON) 20 MEQ tablet Take 20 mEq by mouth daily.      . predniSONE (DELTASONE) 10 MG tablet Take 1 tablet (10 mg total) by mouth daily with breakfast.  42 tablet  0  . acetaminophen (TYLENOL) 500 MG tablet Take 1,000 mg by mouth every 6 (six) hours as needed. Pain      . magnesium hydroxide (MILK OF MAGNESIA) 400 MG/5ML suspension Take 30 mLs by mouth daily as needed for constipation.      . sodium chloride (OCEAN) 0.65 % SOLN nasal spray Place 1 spray into the nose as needed for congestion.    0   Scheduled: . albuterol  2.5 mg Nebulization Q6H  . antiseptic oral rinse  15 mL Mouth Rinse BID  . diltiazem  360 mg Oral Daily  . fluticasone  1 puff Inhalation BID  . furosemide  80 mg Intravenous Q12H  . hydrocortisone sodium succinate  50 mg Intravenous Q12H  . imipenem-cilastatin  250 mg Intravenous Q6H  . insulin aspart  0-5 Units Subcutaneous QHS  . insulin aspart  0-9 Units Subcutaneous TID WC  . insulin glargine  15 Units Subcutaneous QHS  . ipratropium  0.5 mg Nebulization Q6H  . pantoprazole (PROTONIX) IV  40 mg Intravenous Q12H  . potassium chloride  10 mEq Intravenous Q1 Hr x  4  . potassium chloride  20 mEq Oral Once  . sodium chloride  3 mL Intravenous Q12H   Continuous:  [id number], albuterol, camphor-menthol, morphine injection, ondansetron (ZOFRAN) IV, polyethylene glycol, sodium chloride  Assesment: He looks much better. His hemoglobin level has stabilized. His kidney function is remaining stable despite diuresis. He is now hypokalemic. Active Problems:   COPD (chronic obstructive pulmonary disease)   Type 2 diabetes mellitus, uncontrolled   Anemia, normocytic normochromic   Pleural effusion   Black stool   PUD (peptic ulcer disease)    Plan:  He will continue IV Lasix. He will have potassium replacement. He will have a chest x-ray today and potential thoracentesis tomorrow.    LOS: 3 days   [name] [name] 06/28/2013, 10:18 AM

## 2013-06-29 ENCOUNTER — Inpatient Hospital Stay (HOSPITAL_COMMUNITY): Payer: Medicare Other

## 2013-06-29 DIAGNOSIS — K222 Esophageal obstruction: Secondary | ICD-10-CM

## 2013-06-29 DIAGNOSIS — K259 Gastric ulcer, unspecified as acute or chronic, without hemorrhage or perforation: Secondary | ICD-10-CM

## 2013-06-29 LAB — BODY FLUID CELL COUNT WITH DIFFERENTIAL
Neutrophil Count, Fluid: 5 % (ref 0–25)
Total Nucleated Cell Count, Fluid: 214 cu mm (ref 0–1000)

## 2013-06-29 LAB — GLUCOSE, CAPILLARY: Glucose-Capillary: 375 mg/dL — ABNORMAL HIGH (ref 70–99)

## 2013-06-29 LAB — BASIC METABOLIC PANEL
CO2: >45 mEq/L (ref 19–32)
Glucose, Bld: 202 mg/dL — ABNORMAL HIGH (ref 70–99)
Potassium: 3.1 mEq/L — ABNORMAL LOW (ref 3.5–5.1)
Sodium: 142 mEq/L (ref 135–145)

## 2013-06-29 LAB — PH, BODY FLUID: pH, Fluid: 8

## 2013-06-29 MED ORDER — SODIUM CHLORIDE 0.9 % IV SOLN
500.0000 mg | Freq: Three times a day (TID) | INTRAVENOUS | Status: DC
  Administered 2013-06-29 – 2013-07-02 (×9): 500 mg via INTRAVENOUS
  Filled 2013-06-29 (×13): qty 500

## 2013-06-29 MED ORDER — POTASSIUM CHLORIDE CRYS ER 20 MEQ PO TBCR
20.0000 meq | EXTENDED_RELEASE_TABLET | Freq: Two times a day (BID) | ORAL | Status: DC
  Administered 2013-06-29 – 2013-07-02 (×7): 20 meq via ORAL
  Filled 2013-06-29 (×7): qty 1

## 2013-06-29 NOTE — Progress Notes (Signed)
Once returning from the procedure the patient stated that he did not "feel well" .  His family voiced some concerns about his statement.  On assessment the patient was assisted back to bed.  His BP: 127/73 R: 20 HR 109 O2 sats at 94 % on O2.  He states he is in some pain but refused interventions offered such as pain med and a heat pack.  I assessed his site and at this time the dressing was DDI.  He stated he did not want have the dressing removed.  I voiced that the dressing had to be on at least 24 hours after the procedure.  His family was satisfied with that.  I voiced that I would continue to monitor him and if there were any further changes I would notify Dr. [name].  The patient does not appear to be in any distress at this time.

## 2013-06-29 NOTE — Progress Notes (Signed)
Start time-1108  End time-1128                        Lidocaine 1%        6mL injected                        pleural fluid removed

## 2013-06-29 NOTE — Procedures (Signed)
PreOperative Dx: Left pleural effusion Postoperative Dx: Left pleural effusion Procedure:   US guided left thoracentesis Radiologist:  Tyron Russell Anesthesia:  6 ml of 1% lidocaine Specimen:  1500 ml of amber colored fluid EBL:   < 1 ml Complications: None

## 2013-06-29 NOTE — Progress Notes (Signed)
Subjective: He says he feels better. He is still weak. He has no other new complaints. He denies any chest pain. He is not coughing much this morning.  Objective: Vital signs in last 24 hours: Temp:  [97.4 F (36.3 C)-98.1 F (36.7 C)] 97.8 F (36.6 C) (06/30 0623) Pulse Rate:  [102-109] 103 (06/30 0623) Resp:  [16-20] 19 (06/30 0623) BP: (110-119)/(57-65) 110/65 mmHg (06/30 0623) SpO2:  [92 %-99 %] 93 % (06/30 0724) Weight:  [73.8 kg (162 lb 11.2 oz)] 73.8 kg (162 lb 11.2 oz) (06/30 0500) Weight change: 0.2 kg (7.1 oz) Last BM Date: 06/26/13  Intake/Output from previous day: 06/29 0701 - 06/30 0700 In: 1040 [P.O.:240; IV Piggyback:800] Out: -   PHYSICAL EXAM General appearance: alert, cooperative and mild distress Resp: clear to auscultation bilaterally Cardio: regular rate and rhythm, S1, S2 normal, no murmur, click, rub or gallop GI: soft, non-tender; bowel sounds normal; no masses,  no organomegaly Extremities: extremities normal, atraumatic, no cyanosis or edema  Lab Results:    Basic Metabolic Panel:  Recent Labs  40/98/11 0559 06/29/13 0534  NA 142 142  K 2.4* 3.1*  CL 90* 92*  CO2 >45* >45*  GLUCOSE 54* 202*  BUN 38* 35*  CREATININE 1.38* 1.31  CALCIUM 8.5 8.4   Liver Function Tests: No results found for this basename: AST, ALT, ALKPHOS, BILITOT, PROT, ALBUMIN,  in the last 72 hours No results found for this basename: LIPASE, AMYLASE,  in the last 72 hours No results found for this basename: AMMONIA,  in the last 72 hours CBC:  Recent Labs  06/27/13 0601 06/28/13 0559  WBC 9.4 17.9*  NEUTROABS 7.3 10.5*  HGB 8.2* 9.2*  HCT 27.4* 29.6*  MCV 96.1 94.6  PLT 150 142*   Cardiac Enzymes: No results found for this basename: CKTOTAL, CKMB, CKMBINDEX, TROPONINI,  in the last 72 hours BNP: No results found for this basename: PROBNP,  in the last 72 hours D-Dimer: No results found for this basename: DDIMER,  in the last 72 hours CBG:  Recent  Labs  06/28/13 0711 06/28/13 0751 06/28/13 1143 06/28/13 1558 06/28/13 2124 06/29/13 0756  GLUCAP 49* 127* 388* 343* 200* 223*   Hemoglobin A1C: No results found for this basename: HGBA1C,  in the last 72 hours Fasting Lipid Panel: No results found for this basename: CHOL, HDL, LDLCALC, TRIG, CHOLHDL, LDLDIRECT,  in the last 72 hours Thyroid Function Tests: No results found for this basename: TSH, T4TOTAL, FREET4, T3FREE, THYROIDAB,  in the last 72 hours Anemia Panel: No results found for this basename: VITAMINB12, FOLATE, FERRITIN, TIBC, IRON, RETICCTPCT,  in the last 72 hours Coagulation: No results found for this basename: LABPROT, INR,  in the last 72 hours Urine Drug Screen: Drugs of Abuse  No results found for this basename: labopia, cocainscrnur, labbenz, amphetmu, thcu, labbarb    Alcohol Level: No results found for this basename: ETH,  in the last 72 hours Urinalysis: No results found for this basename: COLORURINE, APPERANCEUR, LABSPEC, PHURINE, GLUCOSEU, HGBUR, BILIRUBINUR, KETONESUR, PROTEINUR, UROBILINOGEN, NITRITE, LEUKOCYTESUR,  in the last 72 hours Misc. Labs:  ABGS No results found for this basename: PHART, PCO2, PO2ART, TCO2, HCO3,  in the last 72 hours CULTURES Recent Results (from the past 240 hour(s))  CLOSTRIDIUM DIFFICILE BY PCR     Status: None   Collection Time    06/26/13  9:28 AM      Result Value Range Status   C difficile by pcr NEGATIVE  NEGATIVE Final   Studies/Results: Dg Chest Port 1 View  06/28/2013   *RADIOLOGY REPORT*  Clinical Data: Congestive heart failure. Shortness of breath.  PORTABLE CHEST - 1 VIEW  Comparison: 06/25/2013  Findings: Large left pleural effusion with passive atelectasis. Interstitial accentuation bilaterally favoring interstitial edema. Left heart border obscured by mild cardiomegaly suspected.  IMPRESSION:  1.  Large left pleural effusion with passive atelectasis. 2.  Mildly improved interstitial edema.   Original  Report Authenticated By: Gaylyn Rong, M.D.    Medications:  Prior to Admission:  Prescriptions prior to admission  Medication Sig Dispense Refill  . aspirin 81 MG chewable tablet Chew 81 mg by mouth daily.      Marland Kitchen diltiazem (CARDIZEM CD) 360 MG 24 hr capsule Take 1 capsule (360 mg total) by mouth daily.  30 capsule  12  . fluticasone (FLOVENT HFA) 110 MCG/ACT inhaler Inhale 1 puff into the lungs 2 (two) times daily.      . furosemide (LASIX) 40 MG tablet Take 1 tablet (40 mg total) by mouth daily.  30 tablet  12  . glyBURIDE (DIABETA) 5 MG tablet Take 1 tablet (5 mg total) by mouth 2 (two) times daily with a meal.      . insulin aspart (NOVOLOG) 100 UNIT/ML injection Inject 3-15 Units into the skin 3 (three) times daily with meals. Sliding scale as follows: 121-150=3 units 151-200=4 units 201-250=7 units 251-300=9 units 301-350=12 units 351-400=15 units      . insulin glargine (LANTUS) 100 UNIT/ML injection Inject 0.1 mLs (10 Units total) into the skin daily.  10 mL  12  . ipratropium-albuterol (DUONEB) 0.5-2.5 (3) MG/3ML SOLN Take 3 mLs by nebulization 4 (four) times daily.      . pantoprazole (PROTONIX) 40 MG tablet Take 1 tablet (40 mg total) by mouth 2 (two) times daily before a meal.  60 tablet  12  . polyethylene glycol (MIRALAX / GLYCOLAX) packet Take 17 g by mouth daily.      . potassium chloride SA (K-DUR,KLOR-CON) 20 MEQ tablet Take 20 mEq by mouth daily.      . predniSONE (DELTASONE) 10 MG tablet Take 1 tablet (10 mg total) by mouth daily with breakfast.  42 tablet  0  . acetaminophen (TYLENOL) 500 MG tablet Take 1,000 mg by mouth every 6 (six) hours as needed. Pain      . magnesium hydroxide (MILK OF MAGNESIA) 400 MG/5ML suspension Take 30 mLs by mouth daily as needed for constipation.      . sodium chloride (OCEAN) 0.65 % SOLN nasal spray Place 1 spray into the nose as needed for congestion.    0   Scheduled: . albuterol  2.5 mg Nebulization Q6H  . antiseptic oral  rinse  15 mL Mouth Rinse BID  . diltiazem  360 mg Oral Daily  . fluticasone  1 puff Inhalation BID  . furosemide  80 mg Intravenous Q12H  . hydrocortisone sodium succinate  50 mg Intravenous Q12H  . imipenem-cilastatin  250 mg Intravenous Q6H  . insulin aspart  0-5 Units Subcutaneous QHS  . insulin aspart  0-9 Units Subcutaneous TID WC  . insulin glargine  15 Units Subcutaneous QHS  . ipratropium  0.5 mg Nebulization Q6H  . pantoprazole (PROTONIX) IV  40 mg Intravenous Q12H  . potassium chloride  20 mEq Oral BID  . sodium chloride  3 mL Intravenous Q12H   Continuous:  WUJ:[id number], albuterol, camphor-menthol, morphine injection, ondansetron (ZOFRAN) IV, polyethylene glycol, sodium chloride  Assesment: His chest x-ray yesterday showed his pleural effusion has reaccumulated. His heart failure looks better. He has COPD which is stable. He has diabetes which is stable. Active Problems:   Atrial flutter   COPD (chronic obstructive pulmonary disease)   Type 2 diabetes mellitus, uncontrolled   Acute-on-chronic respiratory failure   Acute on chronic diastolic CHF (congestive heart failure), NYHA class 1   Anemia, normocytic normochromic   Hypoalbuminemia   Acute on chronic renal failure   Pleural effusion   Black stool   PUD (peptic ulcer disease)   Hyperkalemia   Hypokalemia    Plan: Continue current treatments. Thoracentesis today. PT starting today    LOS: 4 days   [name] [name] 06/29/2013, 8:47 AM

## 2013-06-29 NOTE — Progress Notes (Signed)
Attempted to take the patient his 12 noon medications.  The daughter stated he was asleep.  They appeared to have wanted him to sleep since earlier he stated he was not feeling well.  I voiced to them to notify me once he was awake and I would bring his medication then.  He had not eaten lunch at this time.

## 2013-06-29 NOTE — Plan of Care (Signed)
Problem: Phase II Progression Outcomes Goal: Progress activity as tolerated unless otherwise ordered Outcome: Progressing Patient was out of the bed to chair today.

## 2013-06-29 NOTE — Progress Notes (Signed)
Subjective: Denies rectal bleeding, nausea, vomiting. No abdominal pain. Just returned from thoracentesis.   Objective: Vital signs in last 24 hours: Temp:  [97.4 F (36.3 C)-98.4 F (36.9 C)] 98.3 F (36.8 C) (06/30 1440) Pulse Rate:  [102-111] 111 (06/30 1440) Resp:  [16-20] 20 (06/30 1440) BP: (110-127)/(49-73) 114/49 mmHg (06/30 1440) SpO2:  [92 %-99 %] 98 % (06/30 1440) Weight:  [162 lb 11.2 oz (73.8 kg)] 162 lb 11.2 oz (73.8 kg) (06/30 0500) Last BM Date: 06/26/13 General:   Alert and oriented, pleasant Head:  Normocephalic and atraumatic. Eyes:  No icterus, sclera clear. Conjuctiva pink.  Heart:  S1, S2 present, no murmurs noted.  Lungs: clear bilaterally, diminished bases Abdomen:  Bowel sounds present, soft, non-tender, non-distended. Reducible umbilical hernia noted.  Msk:  Symmetrical without gross deformities. Normal posture. Extremities:  Without clubbing or edema.   Intake/Output from previous day: 06/29 0701 - 06/30 0700 In: 1040 [P.O.:240; IV Piggyback:800] Out: -  Intake/Output this shift: Total I/O In: 180 [P.O.:180] Out: -   Lab Results:  Recent Labs  06/27/13 0601 06/28/13 0559  WBC 9.4 17.9*  HGB 8.2* 9.2*  HCT 27.4* 29.6*  PLT 150 142*   BMET  Recent Labs  06/27/13 0601 06/28/13 0559 06/29/13 0534  NA 142 142 142  K 4.1 2.4* 3.1*  CL 92* 90* 92*  CO2 >45* >45* >45*  GLUCOSE 128* 54* 202*  BUN 38* 38* 35*  CREATININE 1.38* 1.38* 1.31  CALCIUM 8.5 8.5 8.4     Studies/Results: Dg Chest 1 View  06/29/2013   *RADIOLOGY REPORT*  Clinical Data: 77 year old male status post thoracentesis on the left.  CHEST - 1 VIEW  Comparison: 06/28/2013 and earlier.  Findings: Expiration AP view the chest at 1112 hours.  Interval decreased veiling opacity in the left lung.  No pneumothorax identified.  Patchy residual left lung base opacity.  The right lung is stable.  Stable cardiac size and mediastinal contours. Visualized tracheal air column is  within normal limits.  IMPRESSION: Decreased left pleural effusion and no pneumothorax following left side thoracentesis.   Original Report Authenticated By: Erskine Speed, M.D.   Dg Chest Port 1 View  06/28/2013   *RADIOLOGY REPORT*  Clinical Data: Congestive heart failure. Shortness of breath.  PORTABLE CHEST - 1 VIEW  Comparison: 06/25/2013  Findings: Large left pleural effusion with passive atelectasis. Interstitial accentuation bilaterally favoring interstitial edema. Left heart border obscured by mild cardiomegaly suspected.  IMPRESSION:  1.  Large left pleural effusion with passive atelectasis. 2.  Mildly improved interstitial edema.   Original Report Authenticated By: Gaylyn Rong, M.D.    Assessment: 77 year old male with multiple medical issues, noted to have heme positive stool and possible melena. EGD June 19, 2013 during prior hospitalization noted multiple small gastric ulcers, gastritis, negative H.pylori, patent esophageal stricture. At this point, no active GI bleeding noted and hgb remains stable. Will sign off for now, with follow-up in the office as an outpatient.    Plan: BID PPI for 3 months then once daily Outpatient follow-up in 2 months Signing off   Nira Retort, ANP-BC [facility]  4:29 PM   LOS: 4 days    06/29/2013, 4:25 PM

## 2013-06-29 NOTE — Evaluation (Signed)
Physical Therapy Evaluation Patient Details Name: [name] [name] MRN: [id number] DOB: 06/15/1936 Today's Date: 06/29/2013 Time: 2951-8841 PT Time Calculation (min): 35 min  PT Assessment / Plan / Recommendation History of Present Illness   This is a 77 year old male who was discharge home last week after a 3 week stay for cardiopulmonary disease.  He was at home for 3 days before needing to be readmitted due to dyspnea and blood in stools.  He lives with wife and has excellent family support.  Clinical Impression   Pt demonstrates decreased overall weakness/deconditioning as compared to the time of his discharge last week.  He is alert and very cooperative, but expresses fear of being at home and having to rely on his family to care for him.  I talked to him about the possibility of returning to SNF instead of home at the time of d/c but he states that he just needs to overcome his fear.  He is still functional enough where he should be able to return home at d/c.     PT Assessment  Patient needs continued PT services    Follow Up Recommendations  Home health PT    Does the patient have the potential to tolerate intense rehabilitation    No  Barriers to Discharge  5 steps into mobile home      Equipment Recommendations  None recommended by PT    Recommendations for Other Services OT consult   Frequency Min 3X/week    Precautions / Restrictions Precautions Precautions: Fall Restrictions Weight Bearing Restrictions: No   Pertinent Vitals/Pain       Mobility  Bed Mobility Supine to Sit: 5: Supervision Sitting - Scoot to Edge of Bed: 5: Supervision Sit to Supine: 4: Min assist;HOB elevated Scooting to HOB: 5: Supervision Details for Bed Mobility Assistance: needs assistance pushing from supine to sit because of trunk and UE weakness Transfers Sit to Stand: 4: Min guard Stand to Sit: 4: Min guard Stand Pivot Transfers: 4: Min guard Details for Transfer Assistance: pt  shows increased generalized weakness from last admission Ambulation/Gait Ambulation/Gait Assistance: 4: Min guard Ambulation Distance (Feet): 10 Feet Assistive device: Rolling walker Ambulation/Gait Assistance Details: pt on 3 L O2 with O2 sat = 95%...after transition from supine to sit and gait with walker, O2 sat =86% with HR=135 Gait Pattern: Shuffle;Trunk flexed Gait velocity: WNL General Gait Details: gait endurance is decreased from last week Stairs: No Wheelchair Mobility Wheelchair Mobility: No    Exercises General Exercises - Upper Extremity Shoulder Flexion: AROM;Both;5 reps;Supine General Exercises - Lower Extremity Ankle Circles/Pumps: AROM;Both;10 reps;Supine Quad Sets: AROM;Both;10 reps;Supine Gluteal Sets: AROM;Both;10 reps;Supine Short Arc Quad: AROM;Both;10 reps;Supine Heel Slides: AROM;Both;10 reps;Supine Hip ABduction/ADduction: AROM;Both;10 reps;Supine   PT Diagnosis:    PT Problem List: Decreased strength;Decreased activity tolerance;Decreased mobility;Cardiopulmonary status limiting activity PT Treatment Interventions: Gait training;Functional mobility training;Therapeutic exercise;Patient/family education     PT Goals(Current goals can be found in the care plan section) Acute Rehab PT Goals Patient Stated Goal: return home at d/c PT Goal Formulation: With patient Time For Goal Achievement: 07/13/13 Potential to Achieve Goals: Fair  Visit Information  Last PT Received On: 06/29/13       Prior Functioning       Cognition  Cognition Arousal/Alertness: Awake/alert Behavior During Therapy: [facility] for tasks assessed/performed Overall Cognitive Status: Within Functional Limits for tasks assessed    Extremity/Trunk Assessment Upper Extremity Assessment Upper Extremity Assessment: Generalized weakness Lower Extremity Assessment Lower Extremity Assessment: Generalized  weakness Cervical / Trunk Assessment Cervical / Trunk Assessment: Normal   Balance  Balance Balance Assessed: No  End of Session PT - End of Session Equipment Utilized During Treatment: Oxygen;Gait belt Activity Tolerance: Patient tolerated treatment well Patient left: in chair;with call bell/phone within reach;with family/visitor present  GP     Konrad Penta 06/29/2013, 9:25 AM

## 2013-06-29 NOTE — Progress Notes (Signed)
Utilization Review Complete  

## 2013-06-29 NOTE — Progress Notes (Signed)
ANTIBIOTIC CONSULT NOTE   Pharmacy Consult for Primaxin Indication: rule out pneumonia  No Known Allergies  Patient Measurements: Height: 5\' 6"  (167.6 cm) Weight: 162 lb 11.2 oz (73.8 kg) IBW/kg (Calculated) : 63.8  Vital Signs: Temp: 98.4 F (36.9 C) (06/30 1025) Temp src: Oral (06/30 1025) BP: 127/73 mmHg (06/30 1200) Pulse Rate: 109 (06/30 1200) Intake/Output from previous day: 06/29 0701 - 06/30 0700 In: 1040 [P.O.:240; IV Piggyback:800] Out: -  Intake/Output from this shift: Total I/O In: 120 [P.O.:120] Out: -   Labs:  Recent Labs  06/27/13 0601 06/28/13 0559 06/29/13 0534  WBC 9.4 17.9*  --   HGB 8.2* 9.2*  --   PLT 150 142*  --   CREATININE 1.38* 1.38* 1.31   Estimated Creatinine Clearance: 44 ml/min (by C-G formula based on Cr of 1.31). No results found for this basename: VANCOTROUGH, [name] [name] [name], VANCORANDOM, GENTTROUGH, GENTPEAK, GENTRANDOM, TOBRATROUGH, TOBRAPEAK, TOBRARND, AMIKACINPEAK, AMIKACINTROU, AMIKACIN,  in the last 72 hours   Microbiology: Recent Results (from the past 720 hour(s))  CULTURE, BLOOD (ROUTINE X 2)     Status: None   Collection Time    06/07/13  2:55 PM      Result Value Range Status   Specimen Description BLOOD LEFT ARM   Final   Special Requests BOTTLES DRAWN AEROBIC ONLY 6CC   Final   Culture NO GROWTH 7 DAYS   Final   Report Status 06/14/2013 FINAL   Final  CULTURE, BLOOD (ROUTINE X 2)     Status: None   Collection Time    06/07/13  3:20 PM      Result Value Range Status   Specimen Description BLOOD LEFT ARM   Final   Special Requests BOTTLES DRAWN AEROBIC AND ANAEROBIC 6CC   Final   Culture NO GROWTH 7 DAYS   Final   Report Status 06/14/2013 FINAL   Final  MRSA PCR SCREENING     Status: None   Collection Time    06/07/13  8:01 PM      Result Value Range Status   MRSA by PCR NEGATIVE  NEGATIVE Final   Comment:            The GeneXpert MRSA Assay (FDA     approved for NASAL specimens     only), is one component of [name] [name]      comprehensive MRSA colonization     surveillance program. It is not     intended to diagnose MRSA     infection nor to guide or     monitor treatment for     MRSA infections.  AFB CULTURE WITH SMEAR     Status: None   Collection Time    06/17/13 11:20 AM      Result Value Range Status   Specimen Description FLUID LEFT PLEURAL COLLECTED BY DOCTOR BOLES   Final   Special Requests IMMUNE:COMPROMISED   Final   ACID FAST SMEAR NO ACID FAST BACILLI SEEN   Final   Culture     Final   Value: CULTURE WILL BE EXAMINED FOR 6 WEEKS BEFORE ISSUING [name] [name] FINAL REPORT   Report Status PENDING   Incomplete  BODY FLUID CULTURE     Status: None   Collection Time    06/17/13 11:20 AM      Result Value Range Status   Specimen Description FLUID LEFT PLEURAL COLLECTED BY DOCTOR BOLES   Final   Special Requests IMMUNE:COMPROMISED   Final   Gram Stain  Final   Value: RARE WBC PRESENT, PREDOMINANTLY MONONUCLEAR     NO ORGANISMS SEEN   Culture NO GROWTH 3 DAYS   Final   Report Status 06/20/2013 FINAL   Final  CLOSTRIDIUM DIFFICILE BY PCR     Status: None   Collection Time    06/26/13  9:28 AM      Result Value Range Status   C difficile by pcr NEGATIVE  NEGATIVE Final   Medical History: Past Medical History  Diagnosis Date  . COPD (chronic obstructive pulmonary disease)   . AF (atrial fibrillation)   . Hypertension   . Gout   . Arthritis   . Small bowel obstruction   . Renal insufficiency   . Aortic stenosis   . Hydropneumothorax   . Diabetes mellitus   . CHF (congestive heart failure)   . On home O2     qhs prn  . Pneumonia 05/04/2013    history of pneumonia  . Pneumonia 10/12  . Hydropneumothorax 2012    Medications:  Scheduled:  . albuterol  2.5 mg Nebulization Q6H  . antiseptic oral rinse  15 mL Mouth Rinse BID  . diltiazem  360 mg Oral Daily  . fluticasone  1 puff Inhalation BID  . furosemide  80 mg Intravenous Q12H  . hydrocortisone sodium succinate  50 mg Intravenous  Q12H  . imipenem-cilastatin  500 mg Intravenous Q8H  . insulin aspart  0-5 Units Subcutaneous QHS  . insulin aspart  0-9 Units Subcutaneous TID WC  . insulin glargine  15 Units Subcutaneous QHS  . ipratropium  0.5 mg Nebulization Q6H  . pantoprazole (PROTONIX) IV  40 mg Intravenous Q12H  . potassium chloride  20 mEq Oral BID  . sodium chloride  3 mL Intravenous Q12H   Assessment: 77yo male recently completed ABX therapy for HCAP.  Pt readmitted with elevated WBC and failure to thrive.  Empirically starting broad spectrum ABX with Primaxin.  SCr has improved since admission.    Estimated Creatinine Clearance: 44 ml/min (by C-G formula based on Cr of 1.31).  Goal of Therapy:  Eradicate infection.  Plan:  Primaxin 500mg  IV q8hrs (adjusted for improved renal fxn) Monitor labs, renal fxn, and cultures  [name] [name] [name], [name] [name] [name] 06/29/2013,1:48 PM

## 2013-06-30 LAB — GLUCOSE, CAPILLARY
Glucose-Capillary: 166 mg/dL — ABNORMAL HIGH (ref 70–99)
Glucose-Capillary: 245 mg/dL — ABNORMAL HIGH (ref 70–99)

## 2013-06-30 LAB — BASIC METABOLIC PANEL
CO2: >45 mEq/L (ref 19–32)
Calcium: 8.7 mg/dL (ref 8.4–10.5)
Creatinine, Ser: 1.23 mg/dL (ref 0.50–1.35)
GFR calc non Af Amer: 56 mL/min — ABNORMAL LOW (ref >90)
Glucose, Bld: 210 mg/dL — ABNORMAL HIGH (ref 70–99)
Sodium: 138 mEq/L (ref 135–145)

## 2013-06-30 MED ORDER — PANTOPRAZOLE SODIUM 40 MG PO TBEC
40.0000 mg | DELAYED_RELEASE_TABLET | Freq: Two times a day (BID) | ORAL | Status: DC
  Administered 2013-06-30 – 2013-07-02 (×4): 40 mg via ORAL
  Filled 2013-06-30 (×4): qty 1

## 2013-06-30 MED ORDER — DILTIAZEM HCL ER COATED BEADS 240 MG PO CP24
240.0000 mg | ORAL_CAPSULE | Freq: Two times a day (BID) | ORAL | Status: DC
  Administered 2013-06-30 – 2013-07-02 (×4): 240 mg via ORAL
  Filled 2013-06-30 (×5): qty 1

## 2013-06-30 NOTE — Progress Notes (Signed)
Critical CO2 of >45 called. CO2 has remained the same since 06/27/2013. MD aware.

## 2013-06-30 NOTE — Progress Notes (Signed)
The patient is receiving Protonix by the intravenous route.  Based on criteria approved by the Pharmacy and Therapeutics Committee and the Medical Executive Committee, the medication is being converted to the equivalent oral dose form.  These criteria include: -No Active GI bleeding -Able to tolerate diet of full liquids (or better) or tube feeding OR able to tolerate other medications by the oral or enteral route  If you have any questions about this conversion, please contact the Pharmacy Department (ext [phone]).  Thank you.  Elson Clan, [facility] 06/30/2013 11:56 AM

## 2013-06-30 NOTE — Progress Notes (Signed)
Physical Therapy Treatment Patient Details Name: [name] [name] MRN: [id number] DOB: [birth year] Today's Date: 06/30/2013 Time: 3086-5784 PT Time Calculation (min): 40 min  PT Assessment / Plan / Recommendation  PT Comments   Pt anxious to have PT today.  His breathing is noticeably less labored today and his overall strength and mobility are improved.    Follow Up Recommendations        Does the patient have the potential to tolerate intense rehabilitation     Barriers to Discharge        Equipment Recommendations       Recommendations for Other Services    Frequency     Progress towards PT Goals Progress towards PT goals: Progressing toward goals  Plan Current plan remains appropriate    Precautions / Restrictions     Pertinent Vitals/Pain     Mobility  Bed Mobility Supine to Sit: 5: Supervision;HOB elevated Sitting - Scoot to Edge of Bed: 6: Modified independent (Device/Increase time) Details for Bed Mobility Assistance: stronger with transfer today Transfers Sit to Stand: 5: Supervision;With upper extremity assist Stand to Sit: 5: Supervision;With upper extremity assist Ambulation/Gait Ambulation/Gait Assistance: 4: Min guard Ambulation Distance (Feet): 30 Feet Assistive device: Rolling walker Ambulation/Gait Assistance Details: Pt on 2.5 L O2 with gait...O2 sat decreased to 85%.  He had dificutly bringing O2 sat back up, so O2 flow was raised to 4 L/min whereupon the sat came up to 93%.  O2 was lowered to 3L/min before I left the room Gait Pattern: Trunk flexed;Shuffle Stairs: No Wheelchair Mobility Wheelchair Mobility: No    Exercises General Exercises - Lower Extremity Ankle Circles/Pumps: AROM;Both;10 reps;Supine Quad Sets: AROM;Both;10 reps;Supine Gluteal Sets: AROM;Both;10 reps;Supine Short Arc Quad: AROM;Both;20 reps;Supine Heel Slides: AROM;Both;10 reps;Supine Hip ABduction/ADduction: AROM;Both;10 reps;Supine Straight Leg Raises: AROM;Both;5  reps;Supine   PT Diagnosis:    PT Problem List:   PT Treatment Interventions:     PT Goals (current goals can now be found in the care plan section)    Visit Information  Last PT Received On: 06/30/13    Subjective Data      Cognition       Balance     End of Session PT - End of Session Equipment Utilized During Treatment: Gait belt;Oxygen Activity Tolerance: Patient tolerated treatment well Patient left: in chair;with call bell/phone within reach;with family/visitor present Nurse Communication: Mobility status   GP     Konrad Penta 06/30/2013, 3:25 PM

## 2013-06-30 NOTE — Progress Notes (Signed)
CRITICAL VALUE ALERT  Critical value received:  CO2  Date of notification: 06/30/2013  Time of notification:  0640  Critical value read back: yes  Nurse who received alert:  Knute Neu RN  MD notified (1st page):  [name]  Time of first page:  0655  MD notified (2nd page):  Time of second page:  Responding MD:  [name]  Time MD responded:  0700

## 2013-06-30 NOTE — Progress Notes (Signed)
Subjective: He feels better. He had thoracentesis with removal of 1.5 L of fluid yesterday. He had some chest pain following that but does not have any now. He had a bowel movement yesterday that was loose but not melanotic. He still has episodes of his heart rate being too high.  Objective: Vital signs in last 24 hours: Temp:  [97.5 F (36.4 C)-98.4 F (36.9 C)] 97.6 F (36.4 C) (07/01 0547) Pulse Rate:  [100-111] 101 (07/01 0547) Resp:  [20-24] 22 (07/01 0547) BP: (103-127)/(49-76) 113/76 mmHg (07/01 0547) SpO2:  [92 %-98 %] 92 % (07/01 0746) Weight:  [72.1 kg (158 lb 15.2 oz)-73 kg (160 lb 15 oz)] 72.1 kg (158 lb 15.2 oz) (07/01 0500) Weight change: -0.8 kg (-1 lb 12.2 oz) Last BM Date: 07/26/13  Intake/Output from previous day: 06/30 0701 - 07/01 0700 In: 500 [P.O.:300; IV Piggyback:200] Out: 1600 [Urine:1600]  PHYSICAL EXAM General appearance: alert, cooperative and mild distress Resp: rhonchi bilaterally Cardio: irregularly irregular rhythm GI: soft, non-tender; bowel sounds normal; no masses,  no organomegaly Extremities: extremities normal, atraumatic, no cyanosis or edema  Lab Results:    Basic Metabolic Panel:  Recent Labs  60/45/40 0534 06/30/13 0601  NA 142 138  K 3.1* 3.2*  CL 92* 90*  CO2 >45* >45*  GLUCOSE 202* 210*  BUN 35* 38*  CREATININE 1.31 1.23  CALCIUM 8.4 8.7   Liver Function Tests: No results found for this basename: AST, ALT, ALKPHOS, BILITOT, PROT, ALBUMIN,  in the last 72 hours No results found for this basename: LIPASE, AMYLASE,  in the last 72 hours No results found for this basename: AMMONIA,  in the last 72 hours CBC:  Recent Labs  06/28/13 0559  WBC 17.9*  NEUTROABS 10.5*  HGB 9.2*  HCT 29.6*  MCV 94.6  PLT 142*   Cardiac Enzymes: No results found for this basename: CKTOTAL, CKMB, CKMBINDEX, TROPONINI,  in the last 72 hours BNP: No results found for this basename: PROBNP,  in the last 72 hours D-Dimer: No results  found for this basename: DDIMER,  in the last 72 hours CBG:  Recent Labs  06/29/13 0756 06/29/13 1150 06/29/13 1208 06/29/13 1702 06/29/13 2126 06/30/13 0723  GLUCAP 223* 355* 375* 222* 134* 166*   Hemoglobin A1C: No results found for this basename: HGBA1C,  in the last 72 hours Fasting Lipid Panel: No results found for this basename: CHOL, HDL, LDLCALC, TRIG, CHOLHDL, LDLDIRECT,  in the last 72 hours Thyroid Function Tests: No results found for this basename: TSH, T4TOTAL, FREET4, T3FREE, THYROIDAB,  in the last 72 hours Anemia Panel: No results found for this basename: VITAMINB12, FOLATE, FERRITIN, TIBC, IRON, RETICCTPCT,  in the last 72 hours Coagulation: No results found for this basename: LABPROT, INR,  in the last 72 hours Urine Drug Screen: Drugs of Abuse  No results found for this basename: labopia, cocainscrnur, labbenz, amphetmu, thcu, labbarb    Alcohol Level: No results found for this basename: ETH,  in the last 72 hours Urinalysis: No results found for this basename: COLORURINE, APPERANCEUR, LABSPEC, PHURINE, GLUCOSEU, HGBUR, BILIRUBINUR, KETONESUR, PROTEINUR, UROBILINOGEN, NITRITE, LEUKOCYTESUR,  in the last 72 hours Misc. Labs:  ABGS No results found for this basename: PHART, PCO2, PO2ART, TCO2, HCO3,  in the last 72 hours CULTURES Recent Results (from the past 240 hour(s))  CLOSTRIDIUM DIFFICILE BY PCR     Status: None   Collection Time    06/26/13  9:28 AM      Result Value  Range Status   C difficile by pcr NEGATIVE  NEGATIVE Final   Studies/Results: Dg Chest 1 View  06/29/2013   *RADIOLOGY REPORT*  Clinical Data: 77 year old male status post thoracentesis on the left.  CHEST - 1 VIEW  Comparison: 06/28/2013 and earlier.  Findings: Expiration AP view the chest at 1112 hours.  Interval decreased veiling opacity in the left lung.  No pneumothorax identified.  Patchy residual left lung base opacity.  The right lung is stable.  Stable cardiac size and  mediastinal contours. Visualized tracheal air column is within normal limits.  IMPRESSION: Decreased left pleural effusion and no pneumothorax following left side thoracentesis.   Original Report Authenticated By: Erskine Speed, M.D.   Dg Chest Port 1 View  06/28/2013   *RADIOLOGY REPORT*  Clinical Data: Congestive heart failure. Shortness of breath.  PORTABLE CHEST - 1 VIEW  Comparison: 06/25/2013  Findings: Large left pleural effusion with passive atelectasis. Interstitial accentuation bilaterally favoring interstitial edema. Left heart border obscured by mild cardiomegaly suspected.  IMPRESSION:  1.  Large left pleural effusion with passive atelectasis. 2.  Mildly improved interstitial edema.   Original Report Authenticated By: Gaylyn Rong, M.D.   US Thoracentesis Asp Pleural Space W/img Guide  06/29/2013   *RADIOLOGY REPORT*  CLINICAL DATA: PERSISTENT LEFT PLEURAL EFFUSION  ULTRASOUND GUIDED LEFT THORACENTESIS:  Technique: After explanation of procedure and risks, written informed consent for thoracentesis obtained. Time out protocol followed. Left pleural effusion localized by ultrasound. Site at posterior leftchest prepped and draped in usual sterile fashion. Skin and chest wall anesthetized with 6 ml of 1% lidocaine. Standard 8-French thoracentesis catheter placed into left pleural space. 1500 ml of amber colored fluid aspirated by syringe pump. Procedure tolerated well by patient without immediate complication.  IMPRESSION: Ultrasound-guided left thoracentesis with removal of 1500 ml of pleural fluid. Fluid sent to laboratory for requested analysis.   Original Report Authenticated By: Ulyses Southward, M.D.    Medications:  Prior to Admission:  Prescriptions prior to admission  Medication Sig Dispense Refill  . aspirin 81 MG chewable tablet Chew 81 mg by mouth daily.      Marland Kitchen diltiazem (CARDIZEM CD) 360 MG 24 hr capsule Take 1 capsule (360 mg total) by mouth daily.  30 capsule  12  . fluticasone  (FLOVENT HFA) 110 MCG/ACT inhaler Inhale 1 puff into the lungs 2 (two) times daily.      . furosemide (LASIX) 40 MG tablet Take 1 tablet (40 mg total) by mouth daily.  30 tablet  12  . glyBURIDE (DIABETA) 5 MG tablet Take 1 tablet (5 mg total) by mouth 2 (two) times daily with a meal.      . insulin aspart (NOVOLOG) 100 UNIT/ML injection Inject 3-15 Units into the skin 3 (three) times daily with meals. Sliding scale as follows: 121-150=3 units 151-200=4 units 201-250=7 units 251-300=9 units 301-350=12 units 351-400=15 units      . insulin glargine (LANTUS) 100 UNIT/ML injection Inject 0.1 mLs (10 Units total) into the skin daily.  10 mL  12  . ipratropium-albuterol (DUONEB) 0.5-2.5 (3) MG/3ML SOLN Take 3 mLs by nebulization 4 (four) times daily.      . pantoprazole (PROTONIX) 40 MG tablet Take 1 tablet (40 mg total) by mouth 2 (two) times daily before a meal.  60 tablet  12  . polyethylene glycol (MIRALAX / GLYCOLAX) packet Take 17 g by mouth daily.      . potassium chloride SA (K-DUR,KLOR-CON) 20 MEQ tablet Take 20  mEq by mouth daily.      . predniSONE (DELTASONE) 10 MG tablet Take 1 tablet (10 mg total) by mouth daily with breakfast.  42 tablet  0  . acetaminophen (TYLENOL) 500 MG tablet Take 1,000 mg by mouth every 6 (six) hours as needed. Pain      . magnesium hydroxide (MILK OF MAGNESIA) 400 MG/5ML suspension Take 30 mLs by mouth daily as needed for constipation.      . sodium chloride (OCEAN) 0.65 % SOLN nasal spray Place 1 spray into the nose as needed for congestion.    0   Scheduled: . albuterol  2.5 mg Nebulization Q6H  . antiseptic oral rinse  15 mL Mouth Rinse BID  . diltiazem  240 mg Oral BID  . fluticasone  1 puff Inhalation BID  . furosemide  80 mg Intravenous Q12H  . hydrocortisone sodium succinate  50 mg Intravenous Q12H  . imipenem-cilastatin  500 mg Intravenous Q8H  . insulin aspart  0-5 Units Subcutaneous QHS  . insulin aspart  0-9 Units Subcutaneous TID WC  .  insulin glargine  15 Units Subcutaneous QHS  . ipratropium  0.5 mg Nebulization Q6H  . pantoprazole (PROTONIX) IV  40 mg Intravenous Q12H  . potassium chloride  20 mEq Oral BID  . sodium chloride  3 mL Intravenous Q12H   Continuous:  RUE:[id number], albuterol, camphor-menthol, morphine injection, ondansetron (ZOFRAN) IV, polyethylene glycol, sodium chloride  Assesment: He was admitted with recurrent respiratory failure which appeared be due to acute on chronic diastolic congestive heart failure. He was hyperkalemic but now is hypokalemic. His renal function had deteriorated but has improved markedly. He is having good diuresis. He had a pleural effusion and had thoracentesis yesterday. Active Problems:   Atrial flutter   COPD (chronic obstructive pulmonary disease)   Type 2 diabetes mellitus, uncontrolled   Acute-on-chronic respiratory failure   Acute on chronic diastolic CHF (congestive heart failure), NYHA class 1   Anemia, normocytic normochromic   Hypoalbuminemia   Acute on chronic renal failure   Pleural effusion   Black stool   PUD (peptic ulcer disease)   Hyperkalemia   Hypokalemia    Plan: He appears to be improving on a daily basis. His wife is going to try to get him up and moving around. He has PT consult to help with that. Because of his heart rate on going to increase his diltiazem and split the dose.    LOS: 5 days   Harvy Riera L 06/30/2013, 8:51 AM

## 2013-06-30 NOTE — Progress Notes (Signed)
Inpatient Diabetes Program Recommendations  AACE/ADA: New Consensus Statement on Inpatient Glycemic Control (2013)  Target Ranges:  Prepandial:   less than 140 mg/dL      Peak postprandial:   less than 180 mg/dL (1-2 hours)      Critically ill patients:  140 - 180 mg/dL  Results for VERNIS, EID (MRN [id number]) as of 06/30/2013 11:00  Ref. Range 06/29/2013 07:56 06/29/2013 11:50 06/29/2013 12:08 06/29/2013 17:02 06/29/2013 21:26 06/30/2013 07:23  Glucose-Capillary Latest Range: 70-99 mg/dL 952 (H) 841 (H) 324 (H) 222 (H) 134 (H) 166 (H)     Inpatient Diabetes Program Recommendations Correction (SSI): If steroids are going to be continued, please consider increasing Novolog correction to moderate scale.  Note: Blood glucose over the past 24 hours has ranged from 166-375 mg/dl.  Please consider increasing Novolog correction to moderate scale if steroids are going to be continued.  Also, may want to add Novolog 2 units TID for meal coverage if patient is eating at least 50% of meals.  Will continue to follow.  Thanks, Orlando Penner, RN, MSN, CCRN Diabetes Coordinator Inpatient Diabetes Program [phone]

## 2013-06-30 NOTE — Progress Notes (Signed)
REVIEWED.  

## 2013-07-01 LAB — BASIC METABOLIC PANEL
Chloride: 87 mEq/L — ABNORMAL LOW (ref 96–112)
GFR calc Af Amer: 68 mL/min — ABNORMAL LOW (ref >90)
GFR calc non Af Amer: 59 mL/min — ABNORMAL LOW (ref >90)
Potassium: 3.4 mEq/L — ABNORMAL LOW (ref 3.5–5.1)
Sodium: 136 mEq/L (ref 135–145)

## 2013-07-01 LAB — GLUCOSE, CAPILLARY
Glucose-Capillary: 220 mg/dL — ABNORMAL HIGH (ref 70–99)
Glucose-Capillary: 220 mg/dL — ABNORMAL HIGH (ref 70–99)

## 2013-07-01 MED ORDER — FUROSEMIDE 80 MG PO TABS
80.0000 mg | ORAL_TABLET | Freq: Two times a day (BID) | ORAL | Status: DC
  Administered 2013-07-01 – 2013-07-02 (×2): 80 mg via ORAL
  Filled 2013-07-01 (×2): qty 1

## 2013-07-01 NOTE — Progress Notes (Signed)
Visited with the patient and his daughter.  According to the patient and his daughter there was an issue with getting insulin pens (Lantus and Novolog) due to the copay cost with current insurance.  Verified with Dr. Zadie Cleverly office that patient was on Lantus and Novolog pens.  In working with the patient to decrease cost, I provided the patient with a Novolog pen starter booklet and a Levemir pen starter booklet.  Both booklets have a savings card that allows the patient to pay only up to $25 per prescription for up to two years for the Novolog and Levemir.  Informed that patient that the Levemir is a long acting insulin similar to the Lantus and that I would document that I provided him with the Levemir savings card.  Asked that staff and the patient make Dr. [name] aware of cost issue and request a change in the ong acting insulin from Lantus to Levemir.  Taught patient and his daughter how to use an insulin pen and they were able to teach back proper utilization.    Spoke with the patient and his daughter about new diagnosis. Provided Living well with diabetes booklet and reviewed it with them.  Explained what an A1C is, basic pathophysiology of DM Type 2, basic home care, importance of checking CBGs and maintaining good CBG control to prevent long-term and short-term complications. Reviewed signs and symptoms of hyperglycemia and hypoglycemia and treatment for both.  Also discussed the impact of various factors on blood glucose control, such as diet, exercise, medications, stress, and sickness.  Patient informed me that a dietitian has visited him and provided him with handouts and information on a diabetic diet.  Informed them about the free Diabetes Education class held here at [facility] taught by Brown Human, RD and provided them with information on the class.  Patient's daughter indicated that she and her mother will definitely plan to attend the class.    Both the patient and his daughter  verbalized understanding of information discussed and indicate they have no questions, issues, or concerns related to diabetes at this time.    Thanks, Orlando Penner, RN, MSN, CCRN Diabetes Coordinator Inpatient Diabetes Program [phone]

## 2013-07-01 NOTE — Progress Notes (Signed)
Physical Therapy Treatment Patient Details Name: JAICE DIGIOIA MRN: [id number] DOB: 10-29-1936 Today's Date: 07/01/2013 Time: 0865-7846 PT Time Calculation (min): 29 min  PT Assessment / Plan / Recommendation  PT Comments   Pt tolerated physical therapy session with no compliants.  Able to complete all bed exercises without difficulty.  Gait training with RW, able to complete 20' with SBA, rest break then 32' following.  Bed exercises complete with 4LO2 with 98% O2 sat, increased to 5L O2 with gait decreased to 88%, 97% O2 sat prior gait training back to chair.  Decreased to 3 L O2 at end with 99% O2 sat before I left.    Follow Up Recommendations        Does the patient have the potential to tolerate intense rehabilitation     Barriers to Discharge        Equipment Recommendations       Recommendations for Other Services    Frequency     Progress towards PT Goals    Plan      Precautions / Restrictions Precautions Precautions: Fall Precaution Comments: monitor O2 sats/HR Restrictions Weight Bearing Restrictions: No    Mobility  Bed Mobility Supine to Sit: 5: Supervision;HOB elevated Sitting - Scoot to Edge of Bed: 5: Supervision Transfers Sit to Stand: 5: Supervision;With upper extremity assist Stand to Sit: 5: Supervision;With upper extremity assist Ambulation/Gait Ambulation/Gait Assistance: 4: Min guard Ambulation Distance (Feet): 48 Feet (20', rest, 28') Assistive device: Rolling walker Ambulation/Gait Assistance Details: Nurse increased to 4L O2 prior gait, decreased to 88%, increased to 5L O2 able to return to 98% O2 sat following restbreak, O2 sat at 98% upon sitting.  O2 lowered to 3L before I left room at O2 sat at 99% Gait Pattern: Trunk flexed;Shuffle Gait velocity: WNL Stairs: No Wheelchair Mobility Wheelchair Mobility: No    Exercises General Exercises - Lower Extremity Ankle Circles/Pumps: AROM;Both;10 reps;Supine Quad Sets: AROM;Both;10  reps;Supine Gluteal Sets: AROM;Both;10 reps;Supine Short Arc Quad: AROM;Both;20 reps;Supine Heel Slides: AROM;Both;10 reps;Supine Hip ABduction/ADduction: AROM;Both;10 reps;Supine Straight Leg Raises: AROM;Both;5 reps;Supine Other Exercises Other Exercises: Bridges 10x   PT Diagnosis:    PT Problem List:   PT Treatment Interventions:     PT Goals (current goals can now be found in the care plan section)    Visit Information  Last PT Received On: 07/01/13    Subjective Data      Cognition  Cognition Arousal/Alertness: Awake/alert Behavior During Therapy: [facility] for tasks assessed/performed Overall Cognitive Status: Within Functional Limits for tasks assessed    Balance     End of Session PT - End of Session Equipment Utilized During Treatment: Gait belt;Oxygen Activity Tolerance: Patient tolerated treatment well Patient left: in chair;with call bell/phone within reach;with family/visitor present Nurse Communication: Mobility status   GP     Juel Burrow 07/01/2013, 9:03 AM

## 2013-07-01 NOTE — Progress Notes (Signed)
Subjective: He is significantly better. His breathing is less labored. He did much better with physical therapy yesterday he has no new complaints  Objective: Vital signs in last 24 hours: Temp:  [97.9 F (36.6 C)-98.8 F (37.1 C)] 98 F (36.7 C) (07/02 0518) Pulse Rate:  [82-126] 88 (07/02 0518) Resp:  [20] 20 (07/02 0518) BP: (110-120)/(50-73) 119/71 mmHg (07/02 0518) SpO2:  [90 %-98 %] 98 % (07/02 0804) Weight:  [71.8 kg (158 lb 4.6 oz)] 71.8 kg (158 lb 4.6 oz) (07/02 0500) Weight change: -1.2 kg (-2 lb 10.3 oz) Last BM Date: 06/29/13  Intake/Output from previous day: 07/01 0701 - 07/02 0700 In: 480 [P.O.:180; IV Piggyback:300] Out: 1900 [Urine:1900]  PHYSICAL EXAM General appearance: alert, cooperative and mild distress Resp: clear to auscultation bilaterally Cardio: irregularly irregular rhythm GI: soft, non-tender; bowel sounds normal; no masses,  no organomegaly Extremities: extremities normal, atraumatic, no cyanosis or edema  Lab Results:    Basic Metabolic Panel:  Recent Labs  16/10/96 0601 07/01/13 0556  NA 138 136  K 3.2* 3.4*  CL 90* 87*  CO2 >45* >45*  GLUCOSE 210* 179*  BUN 38* 38*  CREATININE 1.23 1.18  CALCIUM 8.7 8.9   Liver Function Tests: No results found for this basename: AST, ALT, ALKPHOS, BILITOT, PROT, ALBUMIN,  in the last 72 hours No results found for this basename: LIPASE, AMYLASE,  in the last 72 hours No results found for this basename: AMMONIA,  in the last 72 hours CBC: No results found for this basename: WBC, NEUTROABS, HGB, HCT, MCV, PLT,  in the last 72 hours Cardiac Enzymes: No results found for this basename: CKTOTAL, CKMB, CKMBINDEX, TROPONINI,  in the last 72 hours BNP: No results found for this basename: PROBNP,  in the last 72 hours D-Dimer: No results found for this basename: DDIMER,  in the last 72 hours CBG:  Recent Labs  06/29/13 2126 06/30/13 0723 06/30/13 1157 06/30/13 1625 06/30/13 2139 07/01/13 0754   GLUCAP 134* 166* 245* 350* 184* 123*   Hemoglobin A1C: No results found for this basename: HGBA1C,  in the last 72 hours Fasting Lipid Panel: No results found for this basename: CHOL, HDL, LDLCALC, TRIG, CHOLHDL, LDLDIRECT,  in the last 72 hours Thyroid Function Tests: No results found for this basename: TSH, T4TOTAL, FREET4, T3FREE, THYROIDAB,  in the last 72 hours Anemia Panel: No results found for this basename: VITAMINB12, FOLATE, FERRITIN, TIBC, IRON, RETICCTPCT,  in the last 72 hours Coagulation: No results found for this basename: LABPROT, INR,  in the last 72 hours Urine Drug Screen: Drugs of Abuse  No results found for this basename: labopia, cocainscrnur, labbenz, amphetmu, thcu, labbarb    Alcohol Level: No results found for this basename: ETH,  in the last 72 hours Urinalysis: No results found for this basename: COLORURINE, APPERANCEUR, LABSPEC, PHURINE, GLUCOSEU, HGBUR, BILIRUBINUR, KETONESUR, PROTEINUR, UROBILINOGEN, NITRITE, LEUKOCYTESUR,  in the last 72 hours Misc. Labs:  ABGS No results found for this basename: PHART, PCO2, PO2ART, TCO2, HCO3,  in the last 72 hours CULTURES Recent Results (from the past 240 hour(s))  CLOSTRIDIUM DIFFICILE BY PCR     Status: None   Collection Time    06/26/13  9:28 AM      Result Value Range Status   C difficile by pcr NEGATIVE  NEGATIVE Final  BODY FLUID CULTURE     Status: None   Collection Time    06/29/13 11:17 AM      Result Value  Range Status   Specimen Description FLUID PLEURAL COLLECTED BY DOCTOR BOLES   Final   Special Requests NONE   Final   Gram Stain     Final   Value: NO WBC SEEN     NO ORGANISMS SEEN   Culture PENDING   Incomplete   Report Status PENDING   Incomplete   Studies/Results: Dg Chest 1 View  06/29/2013   *RADIOLOGY REPORT*  Clinical Data: 77 year old male status post thoracentesis on the left.  CHEST - 1 VIEW  Comparison: 06/28/2013 and earlier.  Findings: Expiration AP view the chest at 1112  hours.  Interval decreased veiling opacity in the left lung.  No pneumothorax identified.  Patchy residual left lung base opacity.  The right lung is stable.  Stable cardiac size and mediastinal contours. Visualized tracheal air column is within normal limits.  IMPRESSION: Decreased left pleural effusion and no pneumothorax following left side thoracentesis.   Original Report Authenticated By: Erskine Speed, M.D.   US Thoracentesis Asp Pleural Space W/img Guide  06/29/2013   *RADIOLOGY REPORT*  CLINICAL DATA: PERSISTENT LEFT PLEURAL EFFUSION  ULTRASOUND GUIDED LEFT THORACENTESIS:  Technique: After explanation of procedure and risks, written informed consent for thoracentesis obtained. Time out protocol followed. Left pleural effusion localized by ultrasound. Site at posterior leftchest prepped and draped in usual sterile fashion. Skin and chest wall anesthetized with 6 ml of 1% lidocaine. Standard 8-French thoracentesis catheter placed into left pleural space. 1500 ml of amber colored fluid aspirated by syringe pump. Procedure tolerated well by patient without immediate complication.  IMPRESSION: Ultrasound-guided left thoracentesis with removal of 1500 ml of pleural fluid. Fluid sent to laboratory for requested analysis.   Original Report Authenticated By: Ulyses Southward, M.D.    Medications:  Prior to Admission:  Prescriptions prior to admission  Medication Sig Dispense Refill  . aspirin 81 MG chewable tablet Chew 81 mg by mouth daily.      Marland Kitchen diltiazem (CARDIZEM CD) 360 MG 24 hr capsule Take 1 capsule (360 mg total) by mouth daily.  30 capsule  12  . fluticasone (FLOVENT HFA) 110 MCG/ACT inhaler Inhale 1 puff into the lungs 2 (two) times daily.      . furosemide (LASIX) 40 MG tablet Take 1 tablet (40 mg total) by mouth daily.  30 tablet  12  . glyBURIDE (DIABETA) 5 MG tablet Take 1 tablet (5 mg total) by mouth 2 (two) times daily with a meal.      . insulin aspart (NOVOLOG) 100 UNIT/ML injection Inject  3-15 Units into the skin 3 (three) times daily with meals. Sliding scale as follows: 121-150=3 units 151-200=4 units 201-250=7 units 251-300=9 units 301-350=12 units 351-400=15 units      . insulin glargine (LANTUS) 100 UNIT/ML injection Inject 0.1 mLs (10 Units total) into the skin daily.  10 mL  12  . ipratropium-albuterol (DUONEB) 0.5-2.5 (3) MG/3ML SOLN Take 3 mLs by nebulization 4 (four) times daily.      . pantoprazole (PROTONIX) 40 MG tablet Take 1 tablet (40 mg total) by mouth 2 (two) times daily before a meal.  60 tablet  12  . polyethylene glycol (MIRALAX / GLYCOLAX) packet Take 17 g by mouth daily.      . potassium chloride SA (K-DUR,KLOR-CON) 20 MEQ tablet Take 20 mEq by mouth daily.      . predniSONE (DELTASONE) 10 MG tablet Take 1 tablet (10 mg total) by mouth daily with breakfast.  42 tablet  0  .  acetaminophen (TYLENOL) 500 MG tablet Take 1,000 mg by mouth every 6 (six) hours as needed. Pain      . magnesium hydroxide (MILK OF MAGNESIA) 400 MG/5ML suspension Take 30 mLs by mouth daily as needed for constipation.      . sodium chloride (OCEAN) 0.65 % SOLN nasal spray Place 1 spray into the nose as needed for congestion.    0   Scheduled: . albuterol  2.5 mg Nebulization Q6H  . antiseptic oral rinse  15 mL Mouth Rinse BID  . diltiazem  240 mg Oral BID  . fluticasone  1 puff Inhalation BID  . furosemide  80 mg Intravenous Q12H  . hydrocortisone sodium succinate  50 mg Intravenous Q12H  . imipenem-cilastatin  500 mg Intravenous Q8H  . insulin aspart  0-5 Units Subcutaneous QHS  . insulin aspart  0-9 Units Subcutaneous TID WC  . insulin glargine  15 Units Subcutaneous QHS  . ipratropium  0.5 mg Nebulization Q6H  . pantoprazole  40 mg Oral BID AC  . potassium chloride  20 mEq Oral BID  . sodium chloride  3 mL Intravenous Q12H   Continuous:  WUJ:[id number], albuterol, camphor-menthol, morphine injection, ondansetron (ZOFRAN) IV, polyethylene glycol, sodium  chloride  Assesment: He was admitted with acute on chronic diastolic CHF. He is much improved. He had a pleural effusion and that has been drained. He had acute on chronic respiratory failure which is better. He has diabetes mellitus which is improved Active Problems:   Atrial flutter   COPD (chronic obstructive pulmonary disease)   Type 2 diabetes mellitus, uncontrolled   Acute-on-chronic respiratory failure   Acute on chronic diastolic CHF (congestive heart failure), NYHA class 1   Anemia, normocytic normochromic   Hypoalbuminemia   Acute on chronic renal failure   Pleural effusion   Black stool   PUD (peptic ulcer disease)   Hyperkalemia   Hypokalemia    Plan: Continue current treatments. He may be able to be discharged tomorrow    LOS: 6 days   Buck Mcaffee L 07/01/2013, 8:45 AM

## 2013-07-02 MED ORDER — DILTIAZEM HCL ER COATED BEADS 240 MG PO CP24: 240.0000 mg | ORAL_CAPSULE | Freq: Two times a day (BID) | ORAL | Status: AC

## 2013-07-02 MED ORDER — FUROSEMIDE 40 MG PO TABS: 80.0000 mg | ORAL_TABLET | Freq: Two times a day (BID) | ORAL | Status: AC

## 2013-07-02 NOTE — Discharge Summary (Signed)
Pt and family stated pt was reay to go home and pain was under control.  IV was removed prior to DC.  Pt and family were educated with DC instructions. Pt was told that home health would be coming to his home and medications had been sent to pharmacy. Pt will be wheeled to car by PCT and family.

## 2013-07-02 NOTE — Progress Notes (Signed)
He says he feels well and wants to go home. He has done much better with physical therapy. He feels like this time he will be a success at home and I do as well. I will plan to discharge him home today

## 2013-07-02 NOTE — Discharge Summary (Signed)
Physician Discharge Summary  Patient ID: [name] [name] MRN: [id number] DOB/AGE: 75-Apr-1937 75 y.o. Primary Care Physician:Elda Dunkerson L, MD Admit date: 06/25/2013 Discharge date: 07/02/2013    Discharge Diagnoses:   Active Problems:   Atrial flutter   COPD (chronic obstructive pulmonary disease)   Type 2 diabetes mellitus, uncontrolled   Acute-on-chronic respiratory failure   Acute on chronic diastolic CHF (congestive heart failure), NYHA class 1   Anemia, normocytic normochromic   Hypoalbuminemia   Acute on chronic renal failure   Pleural effusion   Black stool   PUD (peptic ulcer disease)   Hyperkalemia   Hypokalemia     Medication List         acetaminophen 500 MG tablet  Commonly known as:  TYLENOL  Take 1,000 mg by mouth every 6 (six) hours as needed. Pain     aspirin 81 MG chewable tablet  Chew 81 mg by mouth daily.     diltiazem 240 MG 24 hr capsule  Commonly known as:  CARDIZEM CD  Take 1 capsule (240 mg total) by mouth 2 (two) times daily.     fluticasone 110 MCG/ACT inhaler  Commonly known as:  FLOVENT HFA  Inhale 1 puff into the lungs 2 (two) times daily.     furosemide 40 MG tablet  Commonly known as:  LASIX  Take 2 tablets (80 mg total) by mouth 2 (two) times daily.     glyBURIDE 5 MG tablet  Commonly known as:  DIABETA  Take 1 tablet (5 mg total) by mouth 2 (two) times daily with a meal.     insulin aspart 100 UNIT/ML injection  Commonly known as:  novoLOG  - Inject 3-15 Units into the skin 3 (three) times daily with meals. Sliding scale as follows:  - 121-150=3 units  - 151-200=4 units  - 201-250=7 units  - 251-300=9 units  - 301-350=12 units  - 351-400=15 units     insulin glargine 100 UNIT/ML injection  Commonly known as:  LANTUS  Inject 0.1 mLs (10 Units total) into the skin daily.     ipratropium-albuterol 0.5-2.5 (3) MG/3ML Soln  Commonly known as:  DUONEB  Take 3 mLs by nebulization 4 (four) times daily.      magnesium hydroxide 400 MG/5ML suspension  Commonly known as:  MILK OF MAGNESIA  Take 30 mLs by mouth daily as needed for constipation.     pantoprazole 40 MG tablet  Commonly known as:  PROTONIX  Take 1 tablet (40 mg total) by mouth 2 (two) times daily before a meal.     polyethylene glycol packet  Commonly known as:  MIRALAX / GLYCOLAX  Take 17 g by mouth daily.     potassium chloride SA 20 MEQ tablet  Commonly known as:  K-DUR,KLOR-CON  Take 20 mEq by mouth daily.     predniSONE 10 MG tablet  Commonly known as:  DELTASONE  Take 1 tablet (10 mg total) by mouth daily with breakfast.     sodium chloride 0.65 % Soln nasal spray  Commonly known as:  OCEAN  Place 1 spray into the nose as needed for congestion.        Discharged Condition: Improved    Consults: None  Significant Diagnostic Studies: Dg Chest 1 View  06/29/2013   *RADIOLOGY REPORT*  Clinical Data: 77 year old male status post thoracentesis on the left.  CHEST - 1 VIEW  Comparison: 06/28/2013 and earlier.  Findings: Expiration AP view the chest at 1112 hours.  Interval  decreased veiling opacity in the left lung.  No pneumothorax identified.  Patchy residual left lung base opacity.  The right lung is stable.  Stable cardiac size and mediastinal contours. Visualized tracheal air column is within normal limits.  IMPRESSION: Decreased left pleural effusion and no pneumothorax following left side thoracentesis.   Original Report Authenticated By: Erskine Speed, M.D.   Dg Chest 1 View  06/17/2013   *RADIOLOGY REPORT*  Clinical Data: Left pleural effusion post thoracentesis  CHEST - 1 VIEW  Comparison: Expiratory AP chest radiograph compared to 06/16/2013  Findings: Marked decrease in left pleural effusion and left lung atelectasis post thoracentesis. Residual pleural effusion and atelectasis at left base. No pneumothorax. Enlargement of cardiac silhouette post median sternotomy and AVR. Right arm PICC line tip projects over  SVC. Atherosclerotic calcification aorta. Persistent right basilar infiltrate. Diffuse osseous demineralization.  IMPRESSION: Marked decrease in left pleural effusion and basilar atelectasis post thoracentesis. No pneumothorax. Enlargement of cardiac silhouette post AVR. Persistent right basilar infiltrate.   Original Report Authenticated By: Ulyses Southward, M.D.   Dg Chest Port 1 View  06/28/2013   *RADIOLOGY REPORT*  Clinical Data: Congestive heart failure. Shortness of breath.  PORTABLE CHEST - 1 VIEW  Comparison: 06/25/2013  Findings: Large left pleural effusion with passive atelectasis. Interstitial accentuation bilaterally favoring interstitial edema. Left heart border obscured by mild cardiomegaly suspected.  IMPRESSION:  1.  Large left pleural effusion with passive atelectasis. 2.  Mildly improved interstitial edema.   Original Report Authenticated By: Gaylyn Rong, M.D.   Dg Chest Port 1 View  06/25/2013   *RADIOLOGY REPORT*  Clinical Data: Short of breath  PORTABLE CHEST - 1 VIEW  Comparison: 06/17/2013  Findings: Worsening congestive heart failure and edema.  Diffuse bilateral edema.  Increasing left effusion and left lower lobe atelectasis.  IMPRESSION: Worsening congestive heart failure and edema.  Worsening left pleural effusion   Original Report Authenticated By: Janeece Riggers, M.D.   Dg Chest Port 1 View  06/16/2013   *RADIOLOGY REPORT*  Clinical Data: Hypoxia.  PORTABLE CHEST - 1 VIEW  Comparison: 06/10/2013.  Findings: Large left pleural effusion with a significant increase in size.  Increased airspace opacity in the remaining aerated left upper lobe.  No significant change in right lung airspace opacity and prominence of the interstitial markings.  Stable median sternotomy wires and prosthetic heart valve.  Thoracic spine degenerative changes.  IMPRESSION:  1.  Significant increase in the size of a large left pleural effusion. 2.  Increased left lung atelectasis or pneumonia. 3.  Stable  right lung airspace opacity suspicious for pneumonia and chronic interstitial lung disease.   Original Report Authenticated By: Beckie Salts, M.D.   Dg Chest Port 1 View  06/10/2013   *RADIOLOGY REPORT*  Clinical Data: PICC placement  PORTABLE CHEST - 1 VIEW  Comparison: 06/07/2013 and CT chest 05/21/2013.  Findings: Right PICC tip is directed superiorly in the region of the SVC, suggesting a possible azygos location.  Heart size is grossly stable.  Large left pleural effusion and bibasilar dependent air space disease persist.  No definite right pleural fluid.  Minimal biapical pleural parenchymal scarring.  IMPRESSION:  1.  Right PICC is directed superiorly in the region of the SVC, and may be within the azygos vein.  Retracting approximately 3.5 cm should better position the tip in the region of the SVC. These results will be called to the ordering clinician or representative by the Radiologist Assistant, and communication documented in the  PACS Dashboard.  2.  Large left pleural effusion with bilateral air space disease, left greater than right, stable.   Original Report Authenticated By: Leanna Battles, M.D.   Dg Chest Port 1 View  06/07/2013   *RADIOLOGY REPORT*  Clinical Data: Tachycardia and shortness of breath.  PORTABLE CHEST - 1 VIEW  Comparison: Chest x-ray 05/25/2013.  Findings: Emphysematous changes again noted.  Markedly worsened opacity at the base of the left hemithorax compatible with increasing atelectasis and/or consolidation and moderate to large left-sided pleural effusion.  Patchy multifocal interstitial and airspace opacities are also noted throughout the mid to lower lungs bilaterally, and have also increased slightly compared to the prior examination.  No evidence of pulmonary edema.  Heart size is upper limits of normal. The patient is rotated to the left on today's exam, resulting in distortion of the mediastinal contours and reduced diagnostic sensitivity and specificity for  mediastinal pathology.  Atherosclerosis in the thoracic aorta.  Status post median sternotomy for aortic valve replacement (a stented bioprosthesis is noted).  IMPRESSION: 1.  Worsening multifocal interstitial and airspace disease throughout the mid and lower lungs bilaterally, particularly at the left base where there is also an enlarging moderate to large left pleural effusion.  Findings are concerning for old worsening multilobar pneumonia.   Original Report Authenticated By: Trudie Reed, M.D.   Dg Chest Port 1v Same Day  06/10/2013   *RADIOLOGY REPORT*  Clinical Data: PICC line placement  PORTABLE CHEST - 1 VIEW SAME DAY  Comparison: 06/10/2013 at 10:23 a.m.  Findings: 1159 hours.  PICC line has been repositioned.  Its tip is difficult to visualize, but likely terminates over the mid SVC. No pneumothorax.  Prior median sternotomy. Cardiomegaly accentuated by AP portable technique.  Moderate left-sided pleural effusion.  Moderate interstitial edema.  Left greater than right lower lobe predominant airspace disease.  IMPRESSION: Right-sided PICC line terminating at mid SVC.  Otherwise, similar appearance of congestive heart failure with left pleural effusion and lower lobe predominant airspace disease.   Original Report Authenticated By: Jeronimo Greaves, M.D.   US Thoracentesis Asp Pleural Space W/img Guide  06/29/2013   *RADIOLOGY REPORT*  CLINICAL DATA: PERSISTENT LEFT PLEURAL EFFUSION  ULTRASOUND GUIDED LEFT THORACENTESIS:  Technique: After explanation of procedure and risks, written informed consent for thoracentesis obtained. Time out protocol followed. Left pleural effusion localized by ultrasound. Site at posterior leftchest prepped and draped in usual sterile fashion. Skin and chest wall anesthetized with 6 ml of 1% lidocaine. Standard 8-French thoracentesis catheter placed into left pleural space. 1500 ml of amber colored fluid aspirated by syringe pump. Procedure tolerated well by patient without  immediate complication.  IMPRESSION: Ultrasound-guided left thoracentesis with removal of 1500 ml of pleural fluid. Fluid sent to laboratory for requested analysis.   Original Report Authenticated By: Ulyses Southward, M.D.   US Thoracentesis Asp Pleural Space W/img Guide  06/17/2013   *RADIOLOGY REPORT*  CLINICAL DATA: Left pleural effusion  ULTRASOUND GUIDED LEFT THORACENTESIS:  Technique: After explanation of procedure and risks, written informed consent for thoracentesis obtained. Time out protocol followed. Left pleural effusion localized by ultrasound. Site at posterior leftchest prepped and draped in usual sterile fashion. Skin and chest wall anesthetized with 6 ml of 1% lidocaine. Standard 8-French thoracentesis catheter placed into left pleural space. 1480 ml of orange colored fluid aspirated by syringe pump. Procedure tolerated well by patient without immediate complication.  IMPRESSION: Ultrasound-guided left thoracentesis with removal of 1480 ml of orange colored left pleural fluid.  Fluid sent to laboratory for requested analysis.   Original Report Authenticated By: Ulyses Southward, M.D.    Lab Results: Basic Metabolic Panel:  Recent Labs  16/10/96 0601 07/01/13 0556  NA 138 136  K 3.2* 3.4*  CL 90* 87*  CO2 >45* >45*  GLUCOSE 210* 179*  BUN 38* 38*  CREATININE 1.23 1.18  CALCIUM 8.7 8.9   Liver Function Tests: No results found for this basename: AST, ALT, ALKPHOS, BILITOT, PROT, ALBUMIN,  in the last 72 hours   CBC: No results found for this basename: WBC, NEUTROABS, HGB, HCT, MCV, PLT,  in the last 72 hours  Recent Results (from the past 240 hour(s))  CLOSTRIDIUM DIFFICILE BY PCR     Status: None   Collection Time    06/26/13  9:28 AM      Result Value Range Status   C difficile by pcr NEGATIVE  NEGATIVE Final  BODY FLUID CULTURE     Status: None   Collection Time    06/29/13 11:17 AM      Result Value Range Status   Specimen Description FLUID PLEURAL COLLECTED BY DOCTOR  BOLES   Final   Special Requests NONE   Final   Gram Stain     Final   Value: NO WBC SEEN     NO ORGANISMS SEEN   Culture NO GROWTH 1 DAY   Final   Report Status PENDING   Incomplete     Hospital Course: He had been discharged from the hospital and then returned because of abdominal problems but when he arrived he was found to be in what seem to be pulmonary edema. His renal function was worse. His potassium had become elevated. It was okay when checked about 2 days before. He was in respiratory distress. Initially it appeared that he might not survive. I had a family conference and they wanted him to be comfortable and he does have no CODE BLUE status but they didn't want [place] to continue to pursue treatment. He was treated aggressively for congestive heart failure and showed marked improvement he had a stool that was positive for blood but his hemoglobin level stabilized. His potassium level has been fairly low throughout the hospitalization. By the time of discharge she was able to ambulate 90+ feet his heart rate was better his congestive heart failure was better  Discharge Exam: Blood pressure 105/68, pulse 95, temperature 98.1 F (36.7 C), temperature source Oral, resp. rate 20, height 5\' 6"  (1.676 m), weight 71.1 kg (156 lb 12 oz), SpO2 94.00%. He is awake and alert. He is in atrial flutter fibrillation. His chest is much clearer.  Disposition: Home with intensive home health services. I intentionally send him home on what may not be an adequate dose of potassium yet until we see how he does at home since he developed hyperkalemia fairly quickly last time. His renal function has actually improved however. He will have basic metabolic profile done on 07/05/2013      Discharge Orders   Future Appointments Provider Department Dept Phone   08/05/2013 8:30 AM Corbin Ade, MD [facility] [phone]   Future Orders Complete By Expires     Discharge patient  As  directed     Comments:      He should be on chf protocol. Needs BMET on 07/04/2013    Face-to-face encounter (required for Medicare/Medicaid patients)  As directed     Comments:      I Russel Morain L  certify that this patient is under my care and that I, or a nurse practitioner or physician's assistant working with me, had a face-to-face encounter that meets the physician face-to-face encounter requirements with this patient on 07/02/2013. The encounter with the patient was in whole, or in part for the following medical condition(s) which is the primary reason for home health care (List medical condition): chf/copd    Questions:      The encounter with the patient was in whole, or in part, for the following medical condition, which is the primary reason for home health care:  chf/copd    I certify that, based on my findings, the following services are medically necessary home health services:  Nursing    Physical therapy    My clinical findings support the need for the above services:  Shortness of breath with activity    Further, I certify that my clinical findings support that this patient is homebound due to:  Shortness of Breath with activity    Reason for Medically Necessary Home Health Services:  Skilled Nursing- Change/Decline in Patient Status    Home Health  As directed     Questions:      To provide the following care/treatments:  PT    RN         Signed: Fredirick Maudlin Pager [phone]  07/02/2013, 9:08 AM

## 2013-07-03 LAB — BODY FLUID CULTURE: Gram Stain: NONE SEEN

## 2013-07-06 NOTE — Progress Notes (Signed)
UR chart review completed.  

## 2013-07-24 ENCOUNTER — Other Ambulatory Visit (HOSPITAL_COMMUNITY): Payer: Self-pay | Admitting: Internal Medicine

## 2013-07-24 ENCOUNTER — Ambulatory Visit (HOSPITAL_COMMUNITY)
Admission: RE | Admit: 2013-07-24 | Discharge: 2013-07-24 | Disposition: A | Discharge status: Home / Self Care | Payer: Medicare Other | Source: Ambulatory Visit | Attending: Pulmonary Disease | Admitting: Pulmonary Disease

## 2013-07-24 ENCOUNTER — Ambulatory Visit (HOSPITAL_COMMUNITY)
Admission: RE | Admit: 2013-07-24 | Discharge: 2013-07-24 | Disposition: A | Discharge status: Home / Self Care | Payer: Medicare Other | Source: Ambulatory Visit | Attending: Internal Medicine | Admitting: Internal Medicine

## 2013-07-24 ENCOUNTER — Other Ambulatory Visit (HOSPITAL_COMMUNITY): Payer: Self-pay | Admitting: Pulmonary Disease

## 2013-07-24 VITALS — BP 97/67 | HR 106 | Temp 98.0°F | Resp 18

## 2013-07-24 DIAGNOSIS — J90 Pleural effusion, not elsewhere classified: Secondary | ICD-10-CM

## 2013-07-24 DIAGNOSIS — R0602 Shortness of breath: Secondary | ICD-10-CM

## 2013-07-24 NOTE — Progress Notes (Signed)
1400 Left Thoracentesis started.  6 cc of 1% Lidocaine injected into the left mid/lower back area.  1130 cc of amber color fluid removed. Pt tolerated procedure well.  VSS.  Pt sent for a chest xray following procedure.

## 2013-07-24 NOTE — Procedures (Signed)
PreOperative Dx: Recurrent LEFT pleural effusion Postoperative Dx: Recurrent LEFT pleural effusion Procedure:   US guided LEFT thoracentesis Radiologist:  Tyron Russell Anesthesia:  6 ml of 1% lidocaine Specimen:  1130 ml of serosanguinous fluid EBL:   < 1 ml Complications: Small loculated LEFT pneumothorax in mid lung; patient asymptomatic

## 2013-07-25 ENCOUNTER — Other Ambulatory Visit (HOSPITAL_COMMUNITY): Payer: Self-pay | Admitting: Diagnostic Radiology

## 2013-07-25 ENCOUNTER — Ambulatory Visit (HOSPITAL_COMMUNITY)
Admission: RE | Admit: 2013-07-25 | Discharge: 2013-07-25 | Disposition: A | Discharge status: Home / Self Care | Payer: Medicare Other | Source: Ambulatory Visit | Attending: Diagnostic Radiology | Admitting: Diagnostic Radiology

## 2013-07-25 DIAGNOSIS — I700 Atherosclerosis of aorta: Secondary | ICD-10-CM | POA: Insufficient documentation

## 2013-07-25 DIAGNOSIS — Z954 Presence of other heart-valve replacement: Secondary | ICD-10-CM | POA: Insufficient documentation

## 2013-07-25 DIAGNOSIS — J939 Pneumothorax, unspecified: Secondary | ICD-10-CM

## 2013-07-25 DIAGNOSIS — J9819 Other pulmonary collapse: Secondary | ICD-10-CM | POA: Insufficient documentation

## 2013-07-25 DIAGNOSIS — J90 Pleural effusion, not elsewhere classified: Secondary | ICD-10-CM | POA: Insufficient documentation

## 2013-07-25 DIAGNOSIS — Z09 Encounter for follow-up examination after completed treatment for conditions other than malignant neoplasm: Secondary | ICD-10-CM | POA: Insufficient documentation

## 2013-07-25 DIAGNOSIS — J8410 Pulmonary fibrosis, unspecified: Secondary | ICD-10-CM | POA: Insufficient documentation

## 2013-07-29 ENCOUNTER — Inpatient Hospital Stay (HOSPITAL_COMMUNITY)
Admission: EM | Admit: 2013-07-29 | Discharge: 2013-08-31 | DRG: 193 | Disposition: E | Payer: Medicare Other | Attending: Pulmonary Disease | Admitting: Pulmonary Disease

## 2013-07-29 ENCOUNTER — Emergency Department (HOSPITAL_COMMUNITY): Payer: Medicare Other

## 2013-07-29 ENCOUNTER — Encounter (HOSPITAL_COMMUNITY): Payer: Self-pay | Admitting: Emergency Medicine

## 2013-07-29 DIAGNOSIS — I129 Hypertensive chronic kidney disease with stage 1 through stage 4 chronic kidney disease, or unspecified chronic kidney disease: Secondary | ICD-10-CM | POA: Diagnosis present

## 2013-07-29 DIAGNOSIS — Z8701 Personal history of pneumonia (recurrent): Secondary | ICD-10-CM

## 2013-07-29 DIAGNOSIS — I5032 Chronic diastolic (congestive) heart failure: Secondary | ICD-10-CM | POA: Diagnosis present

## 2013-07-29 DIAGNOSIS — Z515 Encounter for palliative care: Secondary | ICD-10-CM

## 2013-07-29 DIAGNOSIS — I10 Essential (primary) hypertension: Secondary | ICD-10-CM | POA: Diagnosis present

## 2013-07-29 DIAGNOSIS — IMO0001 Reserved for inherently not codable concepts without codable children: Secondary | ICD-10-CM | POA: Diagnosis present

## 2013-07-29 DIAGNOSIS — I4891 Unspecified atrial fibrillation: Secondary | ICD-10-CM | POA: Diagnosis present

## 2013-07-29 DIAGNOSIS — M129 Arthropathy, unspecified: Secondary | ICD-10-CM | POA: Diagnosis present

## 2013-07-29 DIAGNOSIS — Z87891 Personal history of nicotine dependence: Secondary | ICD-10-CM

## 2013-07-29 DIAGNOSIS — Z7982 Long term (current) use of aspirin: Secondary | ICD-10-CM

## 2013-07-29 DIAGNOSIS — Z8249 Family history of ischemic heart disease and other diseases of the circulatory system: Secondary | ICD-10-CM

## 2013-07-29 DIAGNOSIS — Z794 Long term (current) use of insulin: Secondary | ICD-10-CM

## 2013-07-29 DIAGNOSIS — E1165 Type 2 diabetes mellitus with hyperglycemia: Secondary | ICD-10-CM | POA: Diagnosis present

## 2013-07-29 DIAGNOSIS — D509 Iron deficiency anemia, unspecified: Secondary | ICD-10-CM | POA: Diagnosis present

## 2013-07-29 DIAGNOSIS — Z79899 Other long term (current) drug therapy: Secondary | ICD-10-CM

## 2013-07-29 DIAGNOSIS — IMO0002 Reserved for concepts with insufficient information to code with codable children: Secondary | ICD-10-CM

## 2013-07-29 DIAGNOSIS — Z952 Presence of prosthetic heart valve: Secondary | ICD-10-CM

## 2013-07-29 DIAGNOSIS — R5381 Other malaise: Secondary | ICD-10-CM | POA: Diagnosis not present

## 2013-07-29 DIAGNOSIS — J189 Pneumonia, unspecified organism: Principal | ICD-10-CM

## 2013-07-29 DIAGNOSIS — I509 Heart failure, unspecified: Secondary | ICD-10-CM | POA: Diagnosis present

## 2013-07-29 DIAGNOSIS — D649 Anemia, unspecified: Secondary | ICD-10-CM

## 2013-07-29 DIAGNOSIS — I5033 Acute on chronic diastolic (congestive) heart failure: Secondary | ICD-10-CM | POA: Diagnosis present

## 2013-07-29 DIAGNOSIS — M109 Gout, unspecified: Secondary | ICD-10-CM | POA: Diagnosis present

## 2013-07-29 DIAGNOSIS — Z9981 Dependence on supplemental oxygen: Secondary | ICD-10-CM

## 2013-07-29 DIAGNOSIS — J90 Pleural effusion, not elsewhere classified: Secondary | ICD-10-CM | POA: Diagnosis present

## 2013-07-29 DIAGNOSIS — Z66 Do not resuscitate: Secondary | ICD-10-CM | POA: Diagnosis present

## 2013-07-29 DIAGNOSIS — I359 Nonrheumatic aortic valve disorder, unspecified: Secondary | ICD-10-CM | POA: Diagnosis present

## 2013-07-29 DIAGNOSIS — J9620 Acute and chronic respiratory failure, unspecified whether with hypoxia or hypercapnia: Secondary | ICD-10-CM

## 2013-07-29 DIAGNOSIS — J449 Chronic obstructive pulmonary disease, unspecified: Secondary | ICD-10-CM | POA: Diagnosis present

## 2013-07-29 DIAGNOSIS — K279 Peptic ulcer, site unspecified, unspecified as acute or chronic, without hemorrhage or perforation: Secondary | ICD-10-CM | POA: Diagnosis present

## 2013-07-29 DIAGNOSIS — J4489 Other specified chronic obstructive pulmonary disease: Secondary | ICD-10-CM | POA: Diagnosis present

## 2013-07-29 DIAGNOSIS — J441 Chronic obstructive pulmonary disease with (acute) exacerbation: Secondary | ICD-10-CM

## 2013-07-29 DIAGNOSIS — Z9861 Coronary angioplasty status: Secondary | ICD-10-CM

## 2013-07-29 DIAGNOSIS — I4892 Unspecified atrial flutter: Secondary | ICD-10-CM

## 2013-07-29 DIAGNOSIS — N183 Chronic kidney disease, stage 3 unspecified: Secondary | ICD-10-CM | POA: Diagnosis present

## 2013-07-29 DIAGNOSIS — Z823 Family history of stroke: Secondary | ICD-10-CM

## 2013-07-29 LAB — CBC WITH DIFFERENTIAL/PLATELET
Basophils Absolute: 0 10*3/uL (ref 0.0–0.1)
HCT: 25.9 % — ABNORMAL LOW (ref 39.0–52.0)
Lymphs Abs: 1 10*3/uL (ref 0.7–4.0)
MCH: 26.6 pg (ref 26.0–34.0)
MCHC: 30.5 g/dL (ref 30.0–36.0)
MCV: 87.2 fL (ref 78.0–100.0)
Monocytes Absolute: 1.9 10*3/uL — ABNORMAL HIGH (ref 0.1–1.0)
Monocytes Relative: 22 % — ABNORMAL HIGH (ref 3–12)
Neutro Abs: 5.8 10*3/uL (ref 1.7–7.7)
Platelets: 199 10*3/uL (ref 150–400)
RDW: 18.8 % — ABNORMAL HIGH (ref 11.5–15.5)
WBC: 8.7 10*3/uL (ref 4.0–10.5)

## 2013-07-29 LAB — RETICULOCYTES
RBC.: 2.94 MIL/uL — ABNORMAL LOW (ref 4.22–5.81)
Retic Count, Absolute: 158.8 10*3/uL (ref 19.0–186.0)
Retic Ct Pct: 5.4 % — ABNORMAL HIGH (ref 0.4–3.1)

## 2013-07-29 LAB — BASIC METABOLIC PANEL
BUN: 41 mg/dL — ABNORMAL HIGH (ref 6–23)
Chloride: 89 mEq/L — ABNORMAL LOW (ref 96–112)
Creatinine, Ser: 1.35 mg/dL (ref 0.50–1.35)
GFR calc non Af Amer: 50 mL/min — ABNORMAL LOW (ref >90)
Glucose, Bld: 156 mg/dL — ABNORMAL HIGH (ref 70–99)
Potassium: 4.1 mEq/L (ref 3.5–5.1)

## 2013-07-29 LAB — TROPONIN I: Troponin I: <0.3 ng/mL (ref <0.30)

## 2013-07-29 LAB — GLUCOSE, CAPILLARY: Glucose-Capillary: 227 mg/dL — ABNORMAL HIGH (ref 70–99)

## 2013-07-29 MED ORDER — IPRATROPIUM BROMIDE 0.02 % IN SOLN
0.5000 mg | Freq: Once | RESPIRATORY_TRACT | Status: AC
Start: 2013-07-29 — End: 2013-07-29
  Administered 2013-07-29: 0.5 mg via RESPIRATORY_TRACT
  Filled 2013-07-29: qty 2.5

## 2013-07-29 MED ORDER — SALINE SPRAY 0.65 % NA SOLN
NASAL | Status: AC
  Filled 2013-07-29: qty 44

## 2013-07-29 MED ORDER — VANCOMYCIN HCL IN DEXTROSE 1-5 GM/200ML-% IV SOLN
1000.0000 mg | Freq: Once | INTRAVENOUS | Status: AC
  Administered 2013-07-29: 1000 mg via INTRAVENOUS
  Filled 2013-07-29: qty 200

## 2013-07-29 MED ORDER — PANTOPRAZOLE SODIUM 40 MG PO TBEC
40.0000 mg | DELAYED_RELEASE_TABLET | Freq: Two times a day (BID) | ORAL | Status: DC
  Administered 2013-07-30 – 2013-08-12 (×26): 40 mg via ORAL
  Filled 2013-07-29 (×26): qty 1

## 2013-07-29 MED ORDER — VANCOMYCIN HCL 500 MG IV SOLR
500.0000 mg | Freq: Two times a day (BID) | INTRAVENOUS | Status: DC
  Administered 2013-07-30 – 2013-08-02 (×8): 500 mg via INTRAVENOUS
  Filled 2013-07-29 (×9): qty 500

## 2013-07-29 MED ORDER — INSULIN GLARGINE 100 UNIT/ML ~~LOC~~ SOLN
5.0000 [IU] | Freq: Every day | SUBCUTANEOUS | Status: DC
  Administered 2013-07-29 – 2013-08-04 (×7): 5 [IU] via SUBCUTANEOUS
  Filled 2013-07-29 (×7): qty 0.05

## 2013-07-29 MED ORDER — INSULIN ASPART 100 UNIT/ML ~~LOC~~ SOLN
0.0000 [IU] | Freq: Every day | SUBCUTANEOUS | Status: DC
  Administered 2013-07-29: 2 [IU] via SUBCUTANEOUS
  Administered 2013-08-01: 3 [IU] via SUBCUTANEOUS
  Administered 2013-08-02: 4 [IU] via SUBCUTANEOUS
  Administered 2013-08-03: 3 [IU] via SUBCUTANEOUS
  Administered 2013-08-04: 4 [IU] via SUBCUTANEOUS

## 2013-07-29 MED ORDER — PIPERACILLIN-TAZOBACTAM 3.375 G IVPB
3.3750 g | Freq: Once | INTRAVENOUS | Status: AC
  Administered 2013-07-29: 3.375 g via INTRAVENOUS
  Filled 2013-07-29: qty 50

## 2013-07-29 MED ORDER — ALBUTEROL SULFATE (5 MG/ML) 0.5% IN NEBU
2.5000 mg | INHALATION_SOLUTION | RESPIRATORY_TRACT | Status: DC | PRN
  Administered 2013-08-03 – 2013-08-13 (×3): 2.5 mg via RESPIRATORY_TRACT
  Filled 2013-07-29: qty 0.5

## 2013-07-29 MED ORDER — SODIUM CHLORIDE 0.9 % IV SOLN
250.0000 mL | INTRAVENOUS | Status: DC | PRN
  Administered 2013-08-08 – 2013-08-13 (×4): 250 mL via INTRAVENOUS

## 2013-07-29 MED ORDER — ALBUTEROL SULFATE (5 MG/ML) 0.5% IN NEBU
2.5000 mg | INHALATION_SOLUTION | Freq: Four times a day (QID) | RESPIRATORY_TRACT | Status: DC
  Administered 2013-07-29 – 2013-07-30 (×4): 2.5 mg via RESPIRATORY_TRACT
  Filled 2013-07-29 (×4): qty 0.5

## 2013-07-29 MED ORDER — GUAIFENESIN ER 600 MG PO TB12
600.0000 mg | ORAL_TABLET | Freq: Two times a day (BID) | ORAL | Status: DC
  Administered 2013-07-29 – 2013-08-11 (×27): 600 mg via ORAL
  Filled 2013-07-29 (×26): qty 1
  Filled 2013-07-29: qty 2

## 2013-07-29 MED ORDER — ALBUTEROL SULFATE (5 MG/ML) 0.5% IN NEBU
5.0000 mg | INHALATION_SOLUTION | Freq: Once | RESPIRATORY_TRACT | Status: AC
  Administered 2013-07-29: 5 mg via RESPIRATORY_TRACT
  Filled 2013-07-29: qty 1

## 2013-07-29 MED ORDER — INSULIN ASPART 100 UNIT/ML ~~LOC~~ SOLN
0.0000 [IU] | Freq: Three times a day (TID) | SUBCUTANEOUS | Status: DC
  Administered 2013-07-30: 2 [IU] via SUBCUTANEOUS
  Administered 2013-07-30: 5 [IU] via SUBCUTANEOUS
  Administered 2013-07-30 – 2013-07-31 (×2): 8 [IU] via SUBCUTANEOUS
  Administered 2013-07-31 – 2013-08-01 (×3): 3 [IU] via SUBCUTANEOUS
  Administered 2013-08-01 – 2013-08-02 (×3): 8 [IU] via SUBCUTANEOUS
  Administered 2013-08-02: 11 [IU] via SUBCUTANEOUS
  Administered 2013-08-02: 5 [IU] via SUBCUTANEOUS
  Administered 2013-08-03 (×3): 11 [IU] via SUBCUTANEOUS
  Administered 2013-08-04 (×2): 8 [IU] via SUBCUTANEOUS
  Administered 2013-08-05: 15 [IU] via SUBCUTANEOUS
  Administered 2013-08-05: 11 [IU] via SUBCUTANEOUS
  Administered 2013-08-05 – 2013-08-06 (×2): 15 [IU] via SUBCUTANEOUS

## 2013-07-29 MED ORDER — ONDANSETRON HCL 4 MG PO TABS: 4.0000 mg | ORAL_TABLET | Freq: Four times a day (QID) | ORAL | Status: DC | PRN

## 2013-07-29 MED ORDER — SODIUM CHLORIDE 0.9 % IJ SOLN
3.0000 mL | Freq: Two times a day (BID) | INTRAMUSCULAR | Status: DC
  Administered 2013-07-30 – 2013-08-13 (×9): 3 mL via INTRAVENOUS

## 2013-07-29 MED ORDER — INSULIN GLARGINE 100 UNIT/ML ~~LOC~~ SOLN
SUBCUTANEOUS | Status: AC
  Filled 2013-07-29: qty 10

## 2013-07-29 MED ORDER — SALINE SPRAY 0.65 % NA SOLN
1.0000 | NASAL | Status: DC | PRN
  Administered 2013-08-02 – 2013-08-03 (×2): 1 via NASAL
  Filled 2013-07-29: qty 44

## 2013-07-29 MED ORDER — DEXTROSE 5 % IV SOLN
2.0000 g | INTRAVENOUS | Status: DC
  Administered 2013-07-29 – 2013-08-03 (×6): 2 g via INTRAVENOUS
  Filled 2013-07-29 (×6): qty 2

## 2013-07-29 MED ORDER — ASPIRIN 81 MG PO CHEW
81.0000 mg | CHEWABLE_TABLET | Freq: Every day | ORAL | Status: DC
  Administered 2013-07-29 – 2013-08-11 (×14): 81 mg via ORAL
  Filled 2013-07-29 (×14): qty 1

## 2013-07-29 MED ORDER — FLUTICASONE PROPIONATE HFA 110 MCG/ACT IN AERO
1.0000 | INHALATION_SPRAY | Freq: Two times a day (BID) | RESPIRATORY_TRACT | Status: DC
  Administered 2013-07-29 – 2013-08-12 (×28): 1 via RESPIRATORY_TRACT
  Filled 2013-07-29: qty 12

## 2013-07-29 MED ORDER — ACETAMINOPHEN 325 MG PO TABS
650.0000 mg | ORAL_TABLET | Freq: Four times a day (QID) | ORAL | Status: DC | PRN
  Filled 2013-07-29: qty 2

## 2013-07-29 MED ORDER — IPRATROPIUM BROMIDE 0.02 % IN SOLN
0.5000 mg | Freq: Four times a day (QID) | RESPIRATORY_TRACT | Status: DC
  Administered 2013-07-29 – 2013-07-30 (×4): 0.5 mg via RESPIRATORY_TRACT
  Filled 2013-07-29 (×4): qty 2.5

## 2013-07-29 MED ORDER — DEXTROSE 5 % IV SOLN
INTRAVENOUS | Status: AC
  Filled 2013-07-29: qty 2

## 2013-07-29 MED ORDER — SODIUM CHLORIDE 0.9 % IJ SOLN
3.0000 mL | Freq: Two times a day (BID) | INTRAMUSCULAR | Status: DC
  Administered 2013-07-30 – 2013-08-13 (×12): 3 mL via INTRAVENOUS

## 2013-07-29 MED ORDER — IPRATROPIUM BROMIDE 0.02 % IN SOLN
0.5000 mg | Freq: Once | RESPIRATORY_TRACT | Status: AC
  Administered 2013-07-29: 0.5 mg via RESPIRATORY_TRACT
  Filled 2013-07-29: qty 2.5

## 2013-07-29 MED ORDER — PREDNISONE 10 MG PO TABS
5.0000 mg | ORAL_TABLET | Freq: Every day | ORAL | Status: DC
  Administered 2013-07-30 – 2013-08-01 (×3): 5 mg via ORAL
  Filled 2013-07-29 (×3): qty 1

## 2013-07-29 MED ORDER — ONDANSETRON HCL 4 MG/2ML IJ SOLN: 4.0000 mg | Freq: Four times a day (QID) | INTRAMUSCULAR | Status: DC | PRN

## 2013-07-29 MED ORDER — FUROSEMIDE 80 MG PO TABS
80.0000 mg | ORAL_TABLET | Freq: Two times a day (BID) | ORAL | Status: DC
Start: 2013-07-29 — End: 2013-08-03
  Administered 2013-07-29 – 2013-08-02 (×9): 80 mg via ORAL
  Filled 2013-07-29 (×9): qty 1

## 2013-07-29 MED ORDER — DILTIAZEM HCL ER COATED BEADS 240 MG PO CP24
240.0000 mg | ORAL_CAPSULE | Freq: Two times a day (BID) | ORAL | Status: DC
  Administered 2013-07-29 – 2013-08-11 (×27): 240 mg via ORAL
  Filled 2013-07-29 (×27): qty 1

## 2013-07-29 MED ORDER — SODIUM CHLORIDE 0.9 % IJ SOLN: 3.0000 mL | INTRAMUSCULAR | Status: DC | PRN

## 2013-07-29 MED ORDER — ACETAMINOPHEN 650 MG RE SUPP: 650.0000 mg | Freq: Four times a day (QID) | RECTAL | Status: DC | PRN

## 2013-07-29 NOTE — H&P (Signed)
[name] History and Physical  LES LONGMORE UJW:[id number] DOB: [birth year] DOA: 07/02/2013  Referring physician: Dr. Judd Lien, ER physician PCP: Fredirick Maudlin, MD  Specialists:   Chief Complaint: Shortness of breath  HPI: [name] [name] is a 77 y.o. male with multiple medical problems including chronic respiratory failure on home oxygen, COPD, congestive heart failure, atrial flutter not on Coumadin due to history of bleeding. Patient was recently in the hospital when he was treated for shortness of breath and underwent thoracentesis with removal of pleural fluid. He reports that he been doing fairly well since his discharge and breathing had improved with thoracentesis. His wife reports that over the past 2 days he is becoming acutely short of breath. He's also been coughing with thicker sputum. He may have been febrile. He's not had any diarrhea. He's had no vomiting. His appetite has been fair. He's not had any dysuria. No significant abdominal pain. His weight has been steady and his wife does not report any significant increases over the past several days. He's been compliant with his medications. He was evaluated in the emergency room where chest x-ray indicated a new developing pneumonia. He's been referred for admission.  Review of Systems: Pertinent positives as per history of present illness, otherwise negative  Past Medical History  Diagnosis Date  . COPD (chronic obstructive pulmonary disease)   . AF (atrial fibrillation)   . Hypertension   . Gout   . Arthritis   . Small bowel obstruction   . Renal insufficiency   . Aortic stenosis   . Hydropneumothorax   . Diabetes mellitus   . CHF (congestive heart failure)   . On home O2     qhs prn  . Pneumonia 05/04/2013    history of pneumonia  . Pneumonia 10/12  . Hydropneumothorax 2012   Past Surgical History  Procedure Laterality Date  . Rotator cuff repair      left and right  . Cardiac valve replacement       aortic valve with a tissue prosthesis and left sided maze proc  . S/p rewiring of sternum for dehiscence    . Gallbladder surgery    . US echocardiography  01-03-11    EF 55-60%  . Cardiovascular stress test  01-26-09    EF 67%  . Coronary angioplasty    . Cholecystectomy    . Sternal incision reclosure  08/29/2010    sternal rewiring  . Cataract extraction w/phaco  10/02/2012    Procedure: CATARACT EXTRACTION PHACO AND INTRAOCULAR LENS PLACEMENT (IOC);  Surgeon: Gemma Payor, MD;  Location: [facility];  Service: Ophthalmology;  Laterality: Right;  CDE 16.68  . Cataract extraction w/phaco  10/27/2012    Procedure: CATARACT EXTRACTION PHACO AND INTRAOCULAR LENS PLACEMENT (IOC);  Surgeon: Gemma Payor, MD;  Location: [facility];  Service: Ophthalmology;  Laterality: Left;  CDE:16.89  . Artificial aortic valve    . Colonoscopy N/A 06/19/2013    Dr. Jennell Corner internal hemorrhoids, diverticulosis  . Esophagogastroduodenoscopy N/A 06/19/2013    Dr. Yong Channel stricture passed by scope, multiple small gastric ulcers with evidence of old blood in the stomach, gastritis. Biopsy was negative for H. pylori. There was Candida species and possible colonization of the ulcer.   Social History:  reports that he quit smoking about 8 years ago. His smoking use included Cigarettes. He smoked 0.00 packs per day. He has quit using smokeless tobacco. He reports that he does not drink alcohol or use illicit drugs.   No  Known Allergies  Family History  Problem Relation Age of Onset  . Hypertension Mother   . Stroke Mother   . Heart disease Sister   . Kidney disease Sister     Prior to Admission medications   Medication Sig Start Date End Date Taking? Authorizing Provider  aspirin 81 MG chewable tablet Chew 81 mg by mouth daily.   Yes Historical Provider, MD  diltiazem (CARDIZEM CD) 240 MG 24 hr capsule Take 1 capsule (240 mg total) by mouth 2 (two) times daily. 07/02/13  Yes Fredirick Maudlin, MD  fluticasone  (FLOVENT HFA) 110 MCG/ACT inhaler Inhale 1 puff into the lungs 2 (two) times daily.   Yes Historical Provider, MD  furosemide (LASIX) 40 MG tablet Take 2 tablets (80 mg total) by mouth 2 (two) times daily. 07/02/13  Yes Fredirick Maudlin, MD  glyBURIDE (DIABETA) 5 MG tablet Take 5 mg by mouth daily with breakfast.   Yes Historical Provider, MD  insulin aspart (NOVOLOG) 100 UNIT/ML injection Inject 3-15 Units into the skin 3 (three) times daily with meals. Sliding scale as follows: 121-150=3 units 151-200=4 units 201-250=7 units 251-300=9 units 301-350=12 units 351-400=15 units   Yes Historical Provider, MD  insulin glargine (LANTUS) 100 UNIT/ML injection Inject 5 Units into the skin at bedtime.   Yes Historical Provider, MD  ipratropium-albuterol (DUONEB) 0.5-2.5 (3) MG/3ML SOLN Take 3 mLs by nebulization 4 (four) times daily.   Yes Historical Provider, MD  magnesium hydroxide (MILK OF MAGNESIA) 400 MG/5ML suspension Take 30 mLs by mouth daily as needed for constipation.   Yes Historical Provider, MD  pantoprazole (PROTONIX) 40 MG tablet Take 1 tablet (40 mg total) by mouth 2 (two) times daily before a meal. 06/22/13  Yes Fredirick Maudlin, MD  predniSONE (DELTASONE) 10 MG tablet Take 5 mg by mouth daily with breakfast. 06/22/13  Yes Fredirick Maudlin, MD  sodium chloride (OCEAN) 0.65 % SOLN nasal spray Place 1 spray into the nose as needed for congestion. 05/26/13  Yes Storm Frisk, MD  acetaminophen (TYLENOL) 500 MG tablet Take 1,000 mg by mouth every 6 (six) hours as needed. Pain    Historical Provider, MD   Physical Exam: Filed Vitals:   08/01/2013 1600 2013-08-01 1702 08-01-13 1746 08-01-2013 1852  BP: 115/46  115/58 124/53  Pulse: 116   115  Temp:   98.5 F (36.9 C) 98.1 F (36.7 C)  TempSrc:   Oral Tympanic  Resp: 23  24 22   Height:      Weight:      SpO2: 93% 95% 93% 93%     General:  No acute distress  Eyes: Pupils are equal round react to light  ENT: Mucous membranes are  moist  Neck: Supple  Cardiovascular: S1, S2, tachycardic  Respiratory: Crackles at right upper lobe, diminished breath sounds at bases, no appreciable wheeze  Abdomen: Soft, nontender, nondistended, positive bowel sounds  Skin: No rashes  Musculoskeletal: One plus edema lower extremities bilaterally  Psychiatric: Normal affect, cooperative with exam  Neurologic: Grossly intact, nonfocal  Labs on Admission:  Basic Metabolic Panel:  Recent Labs Lab 2013-08-01 1405  NA 136  K 4.1  CL 89*  CO2 38*  GLUCOSE 156*  BUN 41*  CREATININE 1.35  CALCIUM 8.8   Liver Function Tests: No results found for this basename: AST, ALT, ALKPHOS, BILITOT, PROT, ALBUMIN,  in the last 168 hours No results found for this basename: LIPASE, AMYLASE,  in the last 168 hours No  results found for this basename: AMMONIA,  in the last 168 hours CBC:  Recent Labs Lab 2013-08-04 1405  WBC 8.7  NEUTROABS 5.8  HGB 7.9*  HCT 25.9*  MCV 87.2  PLT 199   Cardiac Enzymes:  Recent Labs Lab 08-04-13 1405  TROPONINI <0.30    BNP (last 3 results)  Recent Labs  06/07/13 1240 06/26/13 0527 2013/08/04 1405  PROBNP 2121.0* 2385.0* 2906.0*   CBG:  Recent Labs Lab 04-Aug-2013 1352  GLUCAP 163*    Radiological Exams on Admission: Dg Chest Portable 1 View  Aug 04, 2013   *RADIOLOGY REPORT*  Clinical Data: Shortness of breath.  PORTABLE CHEST - 1 VIEW  Comparison: July 25, 2013.  Findings: Stable cardiomediastinal silhouette is noted.  Sternotomy wires are noted.  Mild left pleural effusion is noted which is not significantly changed compared to prior exam.  Increased alveolar opacities seen laterally in right upper lobe concerning for pneumonia.  Diffuse interstitial and alveolar opacities are again noted throughout both lungs which are unchanged.  IMPRESSION: Continued presence of bilateral lung opacities which are not significantly changed compared to prior exam.  Focal alveolar opacity is noted in right  upper lobe which is increased compared to prior exam and concerning for possible pneumonia.  Stable mild left pleural effusion.   Original Report Authenticated By: Lupita Raider.,  M.D.    EKG: Independently reviewed. Atrial flutter  Assessment/Plan Active Problems:   Atrial flutter   COPD (chronic obstructive pulmonary disease)   CKD (chronic kidney disease) stage 3, GFR 30-59 ml/min   Type 2 diabetes mellitus, uncontrolled   Acute-on-chronic respiratory failure   HCAP (healthcare-associated pneumonia)   Anemia, normocytic normochromic   1. Acute on chronic respiratory failure likely due to HCAP. Patient will be started on broad-spectrum antibiotics. This can be narrowed as his clinical condition improves. We will try to wean down oxygen back to his home level of 2 L per minute. Continue supportive respiratory care. 2. COPD. This appears to be stable. Continue nebulizer treatments and home prednisone dose. 3. Anemia, chronic. Patient does not have any evidence of bleeding, although his hemoglobin is somewhat lower than his baseline. We will check stool for occult blood and anemia panel. If his hemoglobin trends down further, he may need a blood transfusion. His wife reports that he has been transfused in the past. 4. Chronic kidney disease stage III. Appears to be near baseline. 5. Atrial flutter. Patient is a little tachycardic. This may resolve since he is due to take his evening medications. We will continue his oral diltiazem and observe on telemetry. He is not a candidate for anticoagulation due to history of bleeding and significant anemia. 6. Diabetes. We'll hold oral hypoglycemics and continue long-acting insulin. Supplemental sliding-scale insulin. 7. Chronic diastolic congestive heart failure. Pleural effusions appear to be similar to prior x-rays. He does have significant lower extremity edema, but family reports that this is better than has been in the past. His weight has been  stable. We will continue his home dose of Lasix.  Patient will be admitted to the service of Dr. [name]. [name] will be available for any issues until 7 AM on 7/31.  DVT prophylaxis: SCDs until bleeding can be ruled out. Code Status: full code, but patient and family wish to discuss this with Dr. [name] in further detail Family Communication: discussed with wife and daughter at the bedside Disposition Plan: pending hospital course  Time spent:  [name] Pager [phone]  If 7PM-7AM, please contact night-coverage [email or web address] Password [facility] 08/12/2013, 7:23 PM

## 2013-07-29 NOTE — Progress Notes (Signed)
ANTIBIOTIC CONSULT NOTE - INITIAL  Pharmacy Consult for Vancomycin & Cefepime Indication: pneumonia  No Known Allergies  Patient Measurements: Height: 5\' 6"  (167.6 cm) Weight: 152 lb (68.947 kg) IBW/kg (Calculated) : 63.8  Vital Signs: Temp: 98.1 F (36.7 C) (07/30 1852) Temp src: Tympanic (07/30 1852) BP: 124/53 mmHg (07/30 1852) Pulse Rate: 115 (07/30 1852) Intake/Output from previous day:   Intake/Output from this shift:    Labs:  Recent Labs  07/27/2013 1405  WBC 8.7  HGB 7.9*  PLT 199  CREATININE 1.35   Estimated Creatinine Clearance: 42.7 ml/min (by C-G formula based on Cr of 1.35). No results found for this basename: VANCOTROUGH, VANCOPEAK, VANCORANDOM, GENTTROUGH, GENTPEAK, GENTRANDOM, TOBRATROUGH, TOBRAPEAK, TOBRARND, AMIKACINPEAK, AMIKACINTROU, AMIKACIN,  in the last 72 hours   Microbiology: No results found for this or any previous visit (from the past 720 hour(s)).  Medical History: Past Medical History  Diagnosis Date  . COPD (chronic obstructive pulmonary disease)   . AF (atrial fibrillation)   . Hypertension   . Gout   . Arthritis   . Small bowel obstruction   . Renal insufficiency   . Aortic stenosis   . Hydropneumothorax   . Diabetes mellitus   . CHF (congestive heart failure)   . On home O2     qhs prn  . Pneumonia 05/04/2013    history of pneumonia  . Pneumonia 10/12  . Hydropneumothorax 2012    Medications:  Scheduled:  . albuterol  2.5 mg Nebulization Q6H  . aspirin  81 mg Oral Daily  . diltiazem  240 mg Oral BID  . fluticasone  1 puff Inhalation BID  . furosemide  80 mg Oral BID  . guaiFENesin  600 mg Oral BID  . [START ON 07/30/2013] insulin aspart  0-15 Units Subcutaneous TID WC  . insulin aspart  0-5 Units Subcutaneous QHS  . insulin glargine  5 Units Subcutaneous QHS  . ipratropium  0.5 mg Nebulization Q6H  . [START ON 07/30/2013] pantoprazole  40 mg Oral BID AC  . [START ON 07/30/2013] predniSONE  5 mg Oral Q breakfast   . sodium chloride  3 mL Intravenous Q12H  . sodium chloride  3 mL Intravenous Q12H   Assessment: 77 yo M who was recently hospitalized presents with developing PNA.  He is starting on broad-spectrum antibiotics.    Renal function is at patient's baseline.  Goal of Therapy:  Vancomycin trough level 15-20 mcg/ml  Plan:  1) Cefepime 2gm IV q24h 2) Vancomycin 500mg  IV q12h 3) Check Vancomycin trough at steady state 4) Monitor renal function and cx data   Elson Clan 07/28/2013,7:58 PM

## 2013-07-29 NOTE — ED Notes (Signed)
Pt normally on 3l [place] 02 at home. C/o prod cough with white sputum and sob x 2 days. Pt 02 has been up on 4l since last night and pt denies being any better. Alert/oriented. Denies pain. nad at this time. Slight sob noted. Cough observed. Drained fluid off lungs Friday per wife.

## 2013-07-29 NOTE — ED Provider Notes (Signed)
CSN: [id number]     Arrival date & time 07/03/2013  1332 History     First MD Initiated Contact with Patient 07/23/2013 1350     Chief Complaint  Patient presents with  . Shortness of Breath   (Consider location/radiation/quality/duration/timing/severity/associated sxs/prior Treatment) HPI Comments: Patient with history of COPD, CHF, valve replacement.  Recently hospitalized for pleural effusion and thoracentesis.  He presents today with complaints of increasing shortness of breath.  He denies chest pain, fevers, or chills.    Patient is a 77 y.o. male presenting with shortness of breath. The history is provided by the patient.  Shortness of Breath Severity:  Moderate Onset quality:  Gradual Duration:  5 days Timing:  Constant Progression:  Worsening Chronicity:  Recurrent Context: activity   Relieved by:  Nothing Worsened by:  Nothing tried Ineffective treatments:  None tried Associated symptoms: cough   Associated symptoms: no chest pain, no fever and no sputum production     Past Medical History  Diagnosis Date  . COPD (chronic obstructive pulmonary disease)   . AF (atrial fibrillation)   . Hypertension   . Gout   . Arthritis   . Small bowel obstruction   . Renal insufficiency   . Aortic stenosis   . Hydropneumothorax   . Diabetes mellitus   . CHF (congestive heart failure)   . On home O2     qhs prn  . Pneumonia 05/04/2013    history of pneumonia  . Pneumonia 10/12  . Hydropneumothorax 2012   Past Surgical History  Procedure Laterality Date  . Rotator cuff repair      left and right  . Cardiac valve replacement      aortic valve with a tissue prosthesis and left sided maze proc  . S/p rewiring of sternum for dehiscence    . Gallbladder surgery    . US echocardiography  01-03-11    EF 55-60%  . Cardiovascular stress test  01-26-09    EF 67%  . Coronary angioplasty    . Cholecystectomy    . Sternal incision reclosure  08/29/2010    sternal rewiring  .  Cataract extraction w/phaco  10/02/2012    Procedure: CATARACT EXTRACTION PHACO AND INTRAOCULAR LENS PLACEMENT (IOC);  Surgeon: Gemma Payor, MD;  Location: [facility];  Service: Ophthalmology;  Laterality: Right;  CDE 16.68  . Cataract extraction w/phaco  10/27/2012    Procedure: CATARACT EXTRACTION PHACO AND INTRAOCULAR LENS PLACEMENT (IOC);  Surgeon: Gemma Payor, MD;  Location: [facility];  Service: Ophthalmology;  Laterality: Left;  CDE:16.89  . Artificial aortic valve    . Colonoscopy N/A 06/19/2013    Dr. Jennell Corner internal hemorrhoids, diverticulosis  . Esophagogastroduodenoscopy N/A 06/19/2013    Dr. Yong Channel stricture passed by scope, multiple small gastric ulcers with evidence of old blood in the stomach, gastritis. Biopsy was negative for H. pylori. There was Candida species and possible colonization of the ulcer.   Family History  Problem Relation Age of Onset  . Hypertension Mother   . Stroke Mother   . Heart disease Sister   . Kidney disease Sister    History  Substance Use Topics  . Smoking status: Former Smoker    Types: Cigarettes    Quit date: 01/18/2005  . Smokeless tobacco: Former Neurosurgeon  . Alcohol Use: No    Review of Systems  Constitutional: Negative for fever.  Respiratory: Positive for cough and shortness of breath. Negative for sputum production.   Cardiovascular: Negative for chest pain.  All other systems reviewed and are negative.    Allergies  Review of patient's allergies indicates no known allergies.  Home Medications   Current Outpatient Rx  Name  Route  Sig  Dispense  Refill  . aspirin 81 MG chewable tablet   Oral   Chew 81 mg by mouth daily.         Marland Kitchen diltiazem (CARDIZEM CD) 240 MG 24 hr capsule   Oral   Take 1 capsule (240 mg total) by mouth 2 (two) times daily.   60 capsule   12   . fluticasone (FLOVENT HFA) 110 MCG/ACT inhaler   Inhalation   Inhale 1 puff into the lungs 2 (two) times daily.         . furosemide (LASIX) 40  MG tablet   Oral   Take 2 tablets (80 mg total) by mouth 2 (two) times daily.   30 tablet   12   . glyBURIDE (DIABETA) 5 MG tablet   Oral   Take 5 mg by mouth daily with breakfast.         . insulin aspart (NOVOLOG) 100 UNIT/ML injection   Subcutaneous   Inject 3-15 Units into the skin 3 (three) times daily with meals. Sliding scale as follows: 121-150=3 units 151-200=4 units 201-250=7 units 251-300=9 units 301-350=12 units 351-400=15 units         . insulin glargine (LANTUS) 100 UNIT/ML injection   Subcutaneous   Inject 5 Units into the skin at bedtime.         Marland Kitchen ipratropium-albuterol (DUONEB) 0.5-2.5 (3) MG/3ML SOLN   Nebulization   Take 3 mLs by nebulization 4 (four) times daily.         . magnesium hydroxide (MILK OF MAGNESIA) 400 MG/5ML suspension   Oral   Take 30 mLs by mouth daily as needed for constipation.         . pantoprazole (PROTONIX) 40 MG tablet   Oral   Take 1 tablet (40 mg total) by mouth 2 (two) times daily before a meal.   60 tablet   12   . predniSONE (DELTASONE) 10 MG tablet   Oral   Take 5 mg by mouth daily with breakfast.         . sodium chloride (OCEAN) 0.65 % SOLN nasal spray   Nasal   Place 1 spray into the nose as needed for congestion.      0   . acetaminophen (TYLENOL) 500 MG tablet   Oral   Take 1,000 mg by mouth every 6 (six) hours as needed. Pain          BP 112/54  Pulse 88  Temp(Src) 98.2 F (36.8 C) (Oral)  Resp 26  Ht 5\' 6"  (1.676 m)  Wt 152 lb (68.947 kg)  BMI 24.55 kg/m2  SpO2 93% Physical Exam  Nursing note and vitals reviewed. Constitutional: He is oriented to person, place, and time. He appears well-developed and well-nourished. No distress.  Elderly male in no distress.  HENT:  Head: Normocephalic.  Mouth/Throat: Oropharynx is clear and moist.  Neck: Normal range of motion. Neck supple.  Cardiovascular: Normal rate, regular rhythm and normal heart sounds.   No murmur  heard. Pulmonary/Chest: Effort normal.  There are rales in the right lower lung field.  Breath sounds are diminished in the left.    Abdominal: Soft. Bowel sounds are normal. He exhibits no distension. There is no tenderness.  Musculoskeletal: Normal range of motion. He exhibits no edema.  Neurological: He is alert and oriented to person, place, and time.  Skin: Skin is warm and dry. He is not diaphoretic.    ED Course   Procedures (including critical care time)  Labs Reviewed  CBC WITH DIFFERENTIAL - Abnormal; Notable for the following:    RBC 2.97 (*)    Hemoglobin 7.9 (*)    HCT 25.9 (*)    RDW 18.8 (*)    All other components within normal limits  BASIC METABOLIC PANEL - Abnormal; Notable for the following:    Chloride 89 (*)    CO2 38 (*)    Glucose, Bld 156 (*)    BUN 41 (*)    GFR calc non Af Amer 50 (*)    GFR calc Af Amer 58 (*)    All other components within normal limits  GLUCOSE, CAPILLARY - Abnormal; Notable for the following:    Glucose-Capillary 163 (*)    All other components within normal limits  PRO B NATRIURETIC PEPTIDE - Abnormal; Notable for the following:    Pro B Natriuretic peptide (BNP) 2906.0 (*)    All other components within normal limits  TROPONIN I   Dg Chest Portable 1 View  07/24/2013   *RADIOLOGY REPORT*  Clinical Data: Shortness of breath.  PORTABLE CHEST - 1 VIEW  Comparison: July 25, 2013.  Findings: Stable cardiomediastinal silhouette is noted.  Sternotomy wires are noted.  Mild left pleural effusion is noted which is not significantly changed compared to prior exam.  Increased alveolar opacities seen laterally in right upper lobe concerning for pneumonia.  Diffuse interstitial and alveolar opacities are again noted throughout both lungs which are unchanged.  IMPRESSION: Continued presence of bilateral lung opacities which are not significantly changed compared to prior exam.  Focal alveolar opacity is noted in right upper lobe which is  increased compared to prior exam and concerning for possible pneumonia.  Stable mild left pleural effusion.   Original Report Authenticated By: Lupita Raider.,  M.D.   No diagnosis found.   Date: 07/23/2013  Rate: 104  Rhythm: atrial flutter  QRS Axis: normal  Intervals: normal  ST/T Wave abnormalities: nonspecific T wave changes  Conduction Disutrbances:none  Narrative Interpretation:   Old EKG Reviewed: unchanged   MDM  The patient presents with shortness of breath that appears to be multifactorial.  I suspect an exacerbation of copd, pneumonia, pleural effusion, and chf are all contributing to this.  Will treat with antibiotics, nebs.  I have called Dr. Kerry Hough for admission.    Geoffery Lyons, MD 07/16/2013 1640

## 2013-07-30 LAB — AFB CULTURE WITH SMEAR (NOT AT ARMC)

## 2013-07-30 LAB — VITAMIN B12: Vitamin B-12: 300 pg/mL (ref 211–911)

## 2013-07-30 LAB — BASIC METABOLIC PANEL
BUN: 38 mg/dL — ABNORMAL HIGH (ref 6–23)
GFR calc Af Amer: 54 mL/min — ABNORMAL LOW (ref >90)
GFR calc non Af Amer: 46 mL/min — ABNORMAL LOW (ref >90)
Potassium: 4.1 mEq/L (ref 3.5–5.1)
Sodium: 140 mEq/L (ref 135–145)

## 2013-07-30 LAB — EXPECTORATED SPUTUM ASSESSMENT W GRAM STAIN, RFLX TO RESP C

## 2013-07-30 LAB — GLUCOSE, CAPILLARY
Glucose-Capillary: 128 mg/dL — ABNORMAL HIGH (ref 70–99)
Glucose-Capillary: 142 mg/dL — ABNORMAL HIGH (ref 70–99)

## 2013-07-30 LAB — HEMOGLOBIN A1C: Hgb A1c MFr Bld: 5.1 % (ref <5.7)

## 2013-07-30 LAB — CBC
Hemoglobin: 7.8 g/dL — ABNORMAL LOW (ref 13.0–17.0)
MCHC: 30.5 g/dL (ref 30.0–36.0)

## 2013-07-30 LAB — IRON AND TIBC: TIBC: 230 ug/dL (ref 215–435)

## 2013-07-30 MED ORDER — IPRATROPIUM BROMIDE 0.02 % IN SOLN
0.5000 mg | RESPIRATORY_TRACT | Status: DC
  Administered 2013-07-30 – 2013-08-14 (×85): 0.5 mg via RESPIRATORY_TRACT
  Filled 2013-07-30 (×85): qty 2.5

## 2013-07-30 MED ORDER — BISACODYL 10 MG RE SUPP
10.0000 mg | Freq: Every day | RECTAL | Status: DC | PRN
  Administered 2013-07-30 – 2013-08-09 (×4): 10 mg via RECTAL
  Filled 2013-07-30 (×4): qty 1

## 2013-07-30 MED ORDER — FERROUS FUMARATE 325 (106 FE) MG PO TABS
1.0000 | ORAL_TABLET | Freq: Two times a day (BID) | ORAL | Status: DC
  Administered 2013-07-30 – 2013-08-11 (×26): 106 mg via ORAL
  Filled 2013-07-30 (×15): qty 1
  Filled 2013-07-30: qty 3
  Filled 2013-07-30 (×10): qty 1

## 2013-07-30 MED ORDER — ALBUTEROL SULFATE (5 MG/ML) 0.5% IN NEBU
2.5000 mg | INHALATION_SOLUTION | RESPIRATORY_TRACT | Status: DC
  Administered 2013-07-30 – 2013-08-14 (×83): 2.5 mg via RESPIRATORY_TRACT
  Filled 2013-07-30 (×85): qty 0.5

## 2013-07-30 NOTE — Progress Notes (Signed)
Subjective: He was admitted yesterday with healthcare associated pneumonia. He has had multiple hospitalizations from pneumonia CHF et Karie Soda. He is anemic but don't think he needs a blood transfusion yet. He has had recurrent pleural effusion and had thoracentesis last week. I think if he gets another significant pleural effusion he will probably need some sort of a pleura cath  Objective: Vital signs in last 24 hours: Temp:  [97.9 F (36.6 C)-98.5 F (36.9 C)] 97.9 F (36.6 C) (07/31 0435) Pulse Rate:  [88-116] 99 (07/31 0435) Resp:  [20-26] 22 (07/31 0435) BP: (112-128)/(46-63) 117/61 mmHg (07/31 0435) SpO2:  [88 %-95 %] 91 % (07/31 0716) Weight:  [68.947 kg (152 lb)-71.4 kg (157 lb 6.5 oz)] 69.945 kg (154 lb 3.2 oz) (07/31 0435) Weight change:  Last BM Date: 07/28/13  Intake/Output from previous day: 07/30 0701 - 07/31 0700 In: 240 [P.O.:240] Out: 1350 [Urine:1350]  PHYSICAL EXAM General appearance: alert, cooperative and mild distress Resp: rhonchi bilaterally Cardio: regular rate and rhythm, S1, S2 normal, no murmur, click, rub or gallop GI: soft, non-tender; bowel sounds normal; no masses,  no organomegaly Extremities: edema Trace to 1+  Lab Results:    Basic Metabolic Panel:  Recent Labs  16/10/96 1405 07/30/13 0512  NA 136 140  K 4.1 4.1  CL 89* 93*  CO2 38* 40*  GLUCOSE 156* 112*  BUN 41* 38*  CREATININE 1.35 1.43*  CALCIUM 8.8 8.9   Liver Function Tests: No results found for this basename: AST, ALT, ALKPHOS, BILITOT, PROT, ALBUMIN,  in the last 72 hours No results found for this basename: LIPASE, AMYLASE,  in the last 72 hours No results found for this basename: AMMONIA,  in the last 72 hours CBC:  Recent Labs  07/28/2013 1405 07/30/13 0512  WBC 8.7 7.6  NEUTROABS 5.8  --   HGB 7.9* 7.8*  HCT 25.9* 25.6*  MCV 87.2 88.3  PLT 199 198   Cardiac Enzymes:  Recent Labs  07/20/2013 1405  TROPONINI <0.30   BNP:  Recent Labs  07/26/2013 1405   PROBNP 2906.0*   D-Dimer: No results found for this basename: DDIMER,  in the last 72 hours CBG:  Recent Labs  07/21/2013 1352 07/26/2013 2127 07/30/13 0744  GLUCAP 163* 227* 128*   Hemoglobin A1C: No results found for this basename: HGBA1C,  in the last 72 hours Fasting Lipid Panel: No results found for this basename: CHOL, HDL, LDLCALC, TRIG, CHOLHDL, LDLDIRECT,  in the last 72 hours Thyroid Function Tests: No results found for this basename: TSH, T4TOTAL, FREET4, T3FREE, THYROIDAB,  in the last 72 hours Anemia Panel:  Recent Labs  07/11/2013 1405  RETICCTPCT 5.4*   Coagulation: No results found for this basename: LABPROT, INR,  in the last 72 hours Urine Drug Screen: Drugs of Abuse  No results found for this basename: labopia, cocainscrnur, labbenz, amphetmu, thcu, labbarb    Alcohol Level: No results found for this basename: ETH,  in the last 72 hours Urinalysis: No results found for this basename: COLORURINE, APPERANCEUR, LABSPEC, PHURINE, GLUCOSEU, HGBUR, BILIRUBINUR, KETONESUR, PROTEINUR, UROBILINOGEN, NITRITE, LEUKOCYTESUR,  in the last 72 hours Misc. Labs:  ABGS No results found for this basename: PHART, PCO2, PO2ART, TCO2, HCO3,  in the last 72 hours CULTURES No results found for this or any previous visit (from the past 240 hour(s)). Studies/Results: Dg Chest Portable 1 View  07/01/2013   *RADIOLOGY REPORT*  Clinical Data: Shortness of breath.  PORTABLE CHEST - 1 VIEW  Comparison: July 25, 2013.  Findings: Stable cardiomediastinal silhouette is noted.  Sternotomy wires are noted.  Mild left pleural effusion is noted which is not significantly changed compared to prior exam.  Increased alveolar opacities seen laterally in right upper lobe concerning for pneumonia.  Diffuse interstitial and alveolar opacities are again noted throughout both lungs which are unchanged.  IMPRESSION: Continued presence of bilateral lung opacities which are not significantly changed  compared to prior exam.  Focal alveolar opacity is noted in right upper lobe which is increased compared to prior exam and concerning for possible pneumonia.  Stable mild left pleural effusion.   Original Report Authenticated By: Lupita Raider.,  M.D.    Medications:  Prior to Admission:  Prescriptions prior to admission  Medication Sig Dispense Refill  . aspirin 81 MG chewable tablet Chew 81 mg by mouth daily.      Marland Kitchen diltiazem (CARDIZEM CD) 240 MG 24 hr capsule Take 1 capsule (240 mg total) by mouth 2 (two) times daily.  60 capsule  12  . fluticasone (FLOVENT HFA) 110 MCG/ACT inhaler Inhale 1 puff into the lungs 2 (two) times daily.      . furosemide (LASIX) 40 MG tablet Take 2 tablets (80 mg total) by mouth 2 (two) times daily.  30 tablet  12  . glyBURIDE (DIABETA) 5 MG tablet Take 5 mg by mouth daily with breakfast.      . insulin aspart (NOVOLOG) 100 UNIT/ML injection Inject 3-15 Units into the skin 3 (three) times daily with meals. Sliding scale as follows: 121-150=3 units 151-200=4 units 201-250=7 units 251-300=9 units 301-350=12 units 351-400=15 units      . insulin glargine (LANTUS) 100 UNIT/ML injection Inject 5 Units into the skin at bedtime.      Marland Kitchen ipratropium-albuterol (DUONEB) 0.5-2.5 (3) MG/3ML SOLN Take 3 mLs by nebulization 4 (four) times daily.      . magnesium hydroxide (MILK OF MAGNESIA) 400 MG/5ML suspension Take 30 mLs by mouth daily as needed for constipation.      . pantoprazole (PROTONIX) 40 MG tablet Take 1 tablet (40 mg total) by mouth 2 (two) times daily before a meal.  60 tablet  12  . predniSONE (DELTASONE) 10 MG tablet Take 5 mg by mouth daily with breakfast.      . sodium chloride (OCEAN) 0.65 % SOLN nasal spray Place 1 spray into the nose as needed for congestion.    0  . acetaminophen (TYLENOL) 500 MG tablet Take 1,000 mg by mouth every 6 (six) hours as needed. Pain       Scheduled: . albuterol  2.5 mg Nebulization Q6H  . aspirin  81 mg Oral Daily  .  ceFEPime (MAXIPIME) IV  2 g Intravenous Q24H  . diltiazem  240 mg Oral BID  . ferrous fumarate  1 tablet Oral BID  . fluticasone  1 puff Inhalation BID  . furosemide  80 mg Oral BID  . guaiFENesin  600 mg Oral BID  . insulin aspart  0-15 Units Subcutaneous TID WC  . insulin aspart  0-5 Units Subcutaneous QHS  . insulin glargine  5 Units Subcutaneous QHS  . ipratropium  0.5 mg Nebulization Q6H  . pantoprazole  40 mg Oral BID AC  . predniSONE  5 mg Oral Q breakfast  . sodium chloride  3 mL Intravenous Q12H  . sodium chloride  3 mL Intravenous Q12H  . vancomycin  500 mg Intravenous Q12H   Continuous:  ZOX:WRUEAV chloride, acetaminophen, acetaminophen, albuterol, bisacodyl,  ondansetron (ZOFRAN) IV, ondansetron, sodium chloride, sodium chloride  Assesment: He has healthcare associated pneumonia and acute on chronic respiratory failure from that. He has COPD and seems better. He has chronic atrial flutter but is not a candidate for anti-coagulation because of his anemia. He is anemic which may be partially from chronic disease but he did have peptic ulcer disease as well. He has chronic kidney disease stage III. He has diabetes and that had been doing better at home. He has had recurrent pleural effusions and as noted above I think if he re\re accumulates pleural fluid he's going to need a pleura cath placed. He has chronic diastolic heart failure but does not seem to be having any acute problem with that now Active Problems:   Atrial flutter   COPD (chronic obstructive pulmonary disease)   CKD (chronic kidney disease) stage 3, GFR 30-59 ml/min   Type 2 diabetes mellitus, uncontrolled   Acute-on-chronic respiratory failure   HCAP (healthcare-associated pneumonia)   Anemia, normocytic normochromic    Plan: Continue his IV antibiotics. He is on more oxygen than he has been home so he may need to have that tapered. He has PT consult in place. No other changes in medications except on one to  have him get a suppository for constipation    LOS: 1 day   Jazzlynn Rawe L 07/30/2013, 8:22 AM

## 2013-07-30 NOTE — Progress Notes (Signed)
UR Chart Review Completed  

## 2013-07-30 NOTE — Evaluation (Signed)
Physical Therapy Evaluation Patient Details Name: [name] [name] MRN: [id number] DOB: Feb 25, 1936 Today's Date: 07/30/2013 Time: 4098-1191 PT Time Calculation (min): 33 min  PT Assessment / Plan / Recommendation History of Present Illness  Pt is admitted with new onset of PNA, continuing COPD.  He has had multiple admissions in recent month for similar problem.  He states that since his last admission, he has been doing well and able to be up OOB all day.  He ambulates with a walker at home.  Clinical Impression   Pt is alert and very cooperative but once again has significant limitation of all mobility due to cardiopulmonary status.  We had to increase his O2 to 5 L/min in order to have him transfer bed to chair with a walker and his O2 sat decreased to 86% with this small amount of exertion. (O2 sat had been 92% at rest).  He doesn't appear to have lost any strength at this point and we will follow him to try to minimize any weakness from developing, however his respiratory status will determine this.    PT Assessment  Patient needs continued PT services    Follow Up Recommendations  Home health PT    Does the patient have the potential to tolerate intense rehabilitation    no  Barriers to Discharge        Equipment Recommendations  None recommended by PT    Recommendations for Other Services     Frequency Min 3X/week    Precautions / Restrictions Precautions Precautions: Fall Restrictions Weight Bearing Restrictions: No   Pertinent Vitals/Pain       Mobility  Bed Mobility Bed Mobility: Supine to Sit Supine to Sit: 4: Min guard;HOB elevated Details for Bed Mobility Assistance: O2 increased to 5L/min for exertion as baseline O2 was 88% on 4.5 L/min Transfers Transfers: Editor, commissioning Transfers: 5: Supervision Details for Transfer Assistance: no difficulty with transfer but HR increased to 135 and O2 sat down to 86%...pt uses his walker for  transfer Ambulation/Gait Ambulation/Gait Assistance: Not tested (comment) (unable due to respiratory stress)    Exercises General Exercises - Lower Extremity Ankle Circles/Pumps: AROM;Both;20 reps;Seated Quad Sets: AROM;Both;10 reps;Seated   PT Diagnosis: Difficulty walking  PT Problem List: Decreased activity tolerance;Decreased mobility;Cardiopulmonary status limiting activity PT Treatment Interventions: Gait training;Functional mobility training;Therapeutic exercise;Patient/family education     PT Goals(Current goals can be found in the care plan section) Acute Rehab PT Goals Patient Stated Goal: return home PT Goal Formulation: With patient Time For Goal Achievement: 08/13/13 Potential to Achieve Goals: Good  Visit Information  Last PT Received On: 07/30/13 History of Present Illness: Pt is admitted with new onset of PNA, continuing COPD.  He has had multiple admissions in recent month for similar problem.  He states that since his last admission, he has been doing well and able to be up OOB all day.  He ambulates with a walker at home.       Prior Functioning       Cognition  Cognition Arousal/Alertness: Awake/alert Behavior During Therapy: WFL for tasks assessed/performed Overall Cognitive Status: Within Functional Limits for tasks assessed    Extremity/Trunk Assessment Lower Extremity Assessment Lower Extremity Assessment: Overall WFL for tasks assessed   Balance Balance Balance Assessed: No  End of Session PT - End of Session Equipment Utilized During Treatment: Gait belt Activity Tolerance: Other (comment) (limited by respiratory status) Patient left: in chair;with call bell/phone within reach Nurse Communication: Mobility status  GP     Konrad Penta 07/30/2013, 9:13 AM

## 2013-07-30 NOTE — Progress Notes (Signed)
INITIAL NUTRITION ASSESSMENT  DOCUMENTATION CODES Per approved criteria  -Not Applicable   INTERVENTION: Ensure Complete po BID, each supplement provides 350 kcal and 13 grams of protein.  NUTRITION DIAGNOSIS: Inadequate oral intake related to increased protein-energy needs as evidenced by current nutrition guidelines for COPD.   Goal: Pt to meet >/= 90% of their estimated nutrition needs   Monitor:  Po intake, labs and wt trends  Reason for Assessment: unintentional wt loss, diet recommendations  77 y.o. male  Admitting Dx: Pneumonia (health-care associated)   ASSESSMENT: Pt has severe unexplained wt loss. He denies appetite changes and is eating 100% of meals. Severe weight loss of 23#,13% x 30 days likely related to fluid status. He does have a hx of CHF. In addition, he has increased protein needs associated with Pneumonia and severe COPD. He is agreeable to add oral nutrition supplement to help meet protein-energy goals.  Height: Ht Readings from Last 1 Encounters:  2013/08/25 5\' 6"  (1.676 m)    Weight: Wt Readings from Last 1 Encounters:  07/30/13 154 lb 3.2 oz (69.945 kg)    Ideal Body Weight: 136# (61.8 kg)  % Ideal Body Weight: 113%  Wt Readings from Last 10 Encounters:  07/30/13 154 lb 3.2 oz (69.945 kg)  07/02/13 156 lb 12 oz (71.1 kg)  06/22/13 182 lb 12.8 oz (82.918 kg)  06/22/13 182 lb 12.8 oz (82.918 kg)  05/26/13 163 lb 5.8 oz (74.1 kg)  04/26/13 180 lb 1.9 oz (81.7 kg)  10/27/12 180 lb (81.647 kg)  10/27/12 180 lb (81.647 kg)  09/29/12 180 lb (81.647 kg)  04/01/12 185 lb (83.915 kg)    Usual Body Weight: 180#  % Usual Body Weight: 87%  BMI:  Body mass index is 24.9 kg/(m^2).normal range  Estimated Nutritional Needs: Kcal: 1750-2100 Protein: 77-85 gr Fluid: per MD goals   Skin: No issues noted  Diet Order: Carb Control--po's 100%  EDUCATION NEEDS: -Education needs addressed   Intake/Output Summary (Last 24 hours) at 07/30/13  1017 Last data filed at 07/30/13 0915  Gross per 24 hour  Intake    480 ml  Output   1550 ml  Net  -1070 ml    Last BM:  07/28/13 (pt received stool softener 2/2 constipation)  Labs:   Recent Labs Lab 2013-08-25 1405 07/30/13 0512  NA 136 140  K 4.1 4.1  CL 89* 93*  CO2 38* 40*  BUN 41* 38*  CREATININE 1.35 1.43*  CALCIUM 8.8 8.9  GLUCOSE 156* 112*    CBG (last 3)   Recent Labs  2013-08-25 1352 08-25-13 2127 07/30/13 0744  GLUCAP 163* 227* 128*    Scheduled Meds: . albuterol  2.5 mg Nebulization Q6H  . aspirin  81 mg Oral Daily  . ceFEPime (MAXIPIME) IV  2 g Intravenous Q24H  . diltiazem  240 mg Oral BID  . ferrous fumarate  1 tablet Oral BID  . fluticasone  1 puff Inhalation BID  . furosemide  80 mg Oral BID  . guaiFENesin  600 mg Oral BID  . insulin aspart  0-15 Units Subcutaneous TID WC  . insulin aspart  0-5 Units Subcutaneous QHS  . insulin glargine  5 Units Subcutaneous QHS  . ipratropium  0.5 mg Nebulization Q6H  . pantoprazole  40 mg Oral BID AC  . predniSONE  5 mg Oral Q breakfast  . sodium chloride  3 mL Intravenous Q12H  . sodium chloride  3 mL Intravenous Q12H  . vancomycin  500 mg Intravenous Q12H    Continuous Infusions:   Past Medical History  Diagnosis Date  . COPD (chronic obstructive pulmonary disease)   . AF (atrial fibrillation)   . Hypertension   . Gout   . Arthritis   . Small bowel obstruction   . Renal insufficiency   . Aortic stenosis   . Hydropneumothorax   . Diabetes mellitus   . CHF (congestive heart failure)   . On home O2     qhs prn  . Pneumonia 05/04/2013    history of pneumonia  . Pneumonia 10/12  . Hydropneumothorax 2012    Past Surgical History  Procedure Laterality Date  . Rotator cuff repair      left and right  . Cardiac valve replacement      aortic valve with a tissue prosthesis and left sided maze proc  . S/p rewiring of sternum for dehiscence    . Gallbladder surgery    . US echocardiography   01-03-11    EF 55-60%  . Cardiovascular stress test  01-26-09    EF 67%  . Coronary angioplasty    . Cholecystectomy    . Sternal incision reclosure  08/29/2010    sternal rewiring  . Cataract extraction w/phaco  10/02/2012    Procedure: CATARACT EXTRACTION PHACO AND INTRAOCULAR LENS PLACEMENT (IOC);  Surgeon: Gemma Payor, MD;  Location: [facility];  Service: Ophthalmology;  Laterality: Right;  CDE 16.68  . Cataract extraction w/phaco  10/27/2012    Procedure: CATARACT EXTRACTION PHACO AND INTRAOCULAR LENS PLACEMENT (IOC);  Surgeon: Gemma Payor, MD;  Location: [facility];  Service: Ophthalmology;  Laterality: Left;  CDE:16.89  . Artificial aortic valve    . Colonoscopy N/A 06/19/2013    Dr. Jennell Corner internal hemorrhoids, diverticulosis  . Esophagogastroduodenoscopy N/A 06/19/2013    Dr. Yong Channel stricture passed by scope, multiple small gastric ulcers with evidence of old blood in the stomach, gastritis. Biopsy was negative for H. pylori. There was Candida species and possible colonization of the ulcer.    Royann Shivers MS,RD,LDN,CSG Office: [phone] Pager: [phone]

## 2013-07-31 LAB — CBC WITH DIFFERENTIAL/PLATELET
Eosinophils Absolute: 0 10*3/uL (ref 0.0–0.7)
Eosinophils Relative: 0 % (ref 0–5)
Hemoglobin: 8.1 g/dL — ABNORMAL LOW (ref 13.0–17.0)
Lymphocytes Relative: 14 % (ref 12–46)
Lymphs Abs: 1.2 10*3/uL (ref 0.7–4.0)
MCH: 26.9 pg (ref 26.0–34.0)
MCV: 88.7 fL (ref 78.0–100.0)
Monocytes Relative: 40 % — ABNORMAL HIGH (ref 3–12)
RBC: 3.01 MIL/uL — ABNORMAL LOW (ref 4.22–5.81)
WBC: 8.2 10*3/uL (ref 4.0–10.5)

## 2013-07-31 LAB — GLUCOSE, CAPILLARY: Glucose-Capillary: 199 mg/dL — ABNORMAL HIGH (ref 70–99)

## 2013-07-31 LAB — LEGIONELLA ANTIGEN, URINE: Legionella Antigen, Urine: NEGATIVE

## 2013-07-31 LAB — BASIC METABOLIC PANEL
CO2: 40 mEq/L (ref 19–32)
Chloride: 92 mEq/L — ABNORMAL LOW (ref 96–112)
Glucose, Bld: 168 mg/dL — ABNORMAL HIGH (ref 70–99)
Potassium: 3.6 mEq/L (ref 3.5–5.1)
Sodium: 139 mEq/L (ref 135–145)

## 2013-07-31 NOTE — Progress Notes (Signed)
PT Cancellation Note  Patient Details Name: [name] [name] MRN: [id number] DOB: 01/03/1936   Cancelled Treatment:    Reason Eval/Treat Not Completed: Medical issues which prohibited therapy Several attempts were made to work with pt today, but he was otherwise occupied.  At my last visit, pt was back in bed and stated that he just doesn't have the breath yet to be able to do much with me.  We will hopefully be able to work with him tomorrow.  Konrad Penta 07/31/2013, 3:23 PM

## 2013-07-31 NOTE — Progress Notes (Signed)
Inpatient Diabetes Program Recommendations  AACE/ADA: New Consensus Statement on Inpatient Glycemic Control (2013)  Target Ranges:  Prepandial:   less than 140 mg/dL      Peak postprandial:   less than 180 mg/dL (1-2 hours)      Critically ill patients:  140 - 180 mg/dL   Reason for Visit: Results for AZIZ, SLAPE (MRN [id number]) as of 07/31/2013 14:58  Ref. Range 07/31/2013 07:20 07/31/2013 09:22 07/31/2013 11:37  Glucose-Capillary Latest Range: 70-99 mg/dL 086 (H)  578 (H)   Please consider adding Novolog meal coverage 3 units tid with meals (Hold if patient eats less than 50%).

## 2013-07-31 NOTE — Progress Notes (Signed)
Subjective: He says he is doing okay. He had an episode of shortness of breath yesterday afternoon but is improved today. He is still requiring higher level oxygen and he was at home and still has some low oxygen levels at night. He is not coughing much.  Objective: Vital signs in last 24 hours: Temp:  [98.2 F (36.8 C)-98.7 F (37.1 C)] 98.2 F (36.8 C) (08/01 0544) Pulse Rate:  [97-110] 110 (08/01 0544) Resp:  [20-22] 20 (08/01 0544) BP: (119)/(69-72) 119/72 mmHg (08/01 0544) SpO2:  [86 %-98 %] 95 % (08/01 0715) Weight:  [71.079 kg (156 lb 11.2 oz)] 71.079 kg (156 lb 11.2 oz) (08/01 0447) Weight change: 2.132 kg (4 lb 11.2 oz) Last BM Date: 07/28/13  Intake/Output from previous day: 07/31 0701 - 08/01 0700 In: 1200 [P.O.:1200] Out: 1950 [Urine:1950]  PHYSICAL EXAM General appearance: alert, cooperative and mild distress Resp: clear to auscultation bilaterally Cardio: regular rate and rhythm, S1, S2 normal, no murmur, click, rub or gallop GI: soft, non-tender; bowel sounds normal; no masses,  no organomegaly Extremities: extremities normal, atraumatic, no cyanosis or edema  Lab Results:    Basic Metabolic Panel:  Recent Labs  95/62/13 0512 07/31/13 0442  NA 140 139  K 4.1 3.6  CL 93* 92*  CO2 40* 40*  GLUCOSE 112* 168*  BUN 38* 35*  CREATININE 1.43* 1.33  CALCIUM 8.9 9.1   Liver Function Tests: No results found for this basename: AST, ALT, ALKPHOS, BILITOT, PROT, ALBUMIN,  in the last 72 hours No results found for this basename: LIPASE, AMYLASE,  in the last 72 hours No results found for this basename: AMMONIA,  in the last 72 hours CBC:  Recent Labs  07/10/2013 1405 07/30/13 0512 07/31/13 0442  WBC 8.7 7.6 8.2  NEUTROABS 5.8  --  3.7  HGB 7.9* 7.8* 8.1*  HCT 25.9* 25.6* 26.7*  MCV 87.2 88.3 88.7  PLT 199 198 212   Cardiac Enzymes:  Recent Labs  07/03/2013 1405  TROPONINI <0.30   BNP:  Recent Labs  07/18/2013 1405  PROBNP 2906.0*    D-Dimer: No results found for this basename: DDIMER,  in the last 72 hours CBG:  Recent Labs  07/22/2013 2127 07/30/13 0744 07/30/13 1125 07/30/13 1613 07/30/13 2044 07/31/13 0720  GLUCAP 227* 128* 235* 269* 142* 159*   Hemoglobin A1C:  Recent Labs  07/13/2013 1405  HGBA1C 5.1   Fasting Lipid Panel: No results found for this basename: CHOL, HDL, LDLCALC, TRIG, CHOLHDL, LDLDIRECT,  in the last 72 hours Thyroid Function Tests: No results found for this basename: TSH, T4TOTAL, FREET4, T3FREE, THYROIDAB,  in the last 72 hours Anemia Panel:  Recent Labs  07/06/2013 1405  VITAMINB12 300  FOLATE 15.7  FERRITIN 833*  TIBC 230  IRON 12*  RETICCTPCT 5.4*   Coagulation: No results found for this basename: LABPROT, INR,  in the last 72 hours Urine Drug Screen: Drugs of Abuse  No results found for this basename: labopia, cocainscrnur, labbenz, amphetmu, thcu, labbarb    Alcohol Level: No results found for this basename: ETH,  in the last 72 hours Urinalysis: No results found for this basename: COLORURINE, APPERANCEUR, LABSPEC, PHURINE, GLUCOSEU, HGBUR, BILIRUBINUR, KETONESUR, PROTEINUR, UROBILINOGEN, NITRITE, LEUKOCYTESUR,  in the last 72 hours Misc. Labs:  ABGS No results found for this basename: PHART, PCO2, PO2ART, TCO2, HCO3,  in the last 72 hours CULTURES Recent Results (from the past 240 hour(s))  CULTURE, EXPECTORATED SPUTUM-ASSESSMENT     Status: None  Collection Time    07/30/13  6:56 AM      Result Value Range Status   Specimen Description SPUTUM EXPECTORATED   Final   Special Requests NONE   Final   Sputum evaluation     Final   Value: MICROSCOPIC FINDINGS SUGGEST THAT THIS SPECIMEN IS NOT REPRESENTATIVE OF LOWER RESPIRATORY SECRETIONS. PLEASE RECOLLECT.     Results Called to: [facility] AT 1119 ON 07/30/2013 BY BAUGHAM,M.     Performed at [facility]   Report Status 07/30/2013 FINAL   Final   Studies/Results: Dg Chest Portable 1  View  08-28-2013   *RADIOLOGY REPORT*  Clinical Data: Shortness of breath.  PORTABLE CHEST - 1 VIEW  Comparison: July 25, 2013.  Findings: Stable cardiomediastinal silhouette is noted.  Sternotomy wires are noted.  Mild left pleural effusion is noted which is not significantly changed compared to prior exam.  Increased alveolar opacities seen laterally in right upper lobe concerning for pneumonia.  Diffuse interstitial and alveolar opacities are again noted throughout both lungs which are unchanged.  IMPRESSION: Continued presence of bilateral lung opacities which are not significantly changed compared to prior exam.  Focal alveolar opacity is noted in right upper lobe which is increased compared to prior exam and concerning for possible pneumonia.  Stable mild left pleural effusion.   Original Report Authenticated By: Lupita Raider.,  M.D.    Medications:  Prior to Admission:  Prescriptions prior to admission  Medication Sig Dispense Refill  . aspirin 81 MG chewable tablet Chew 81 mg by mouth daily.      Marland Kitchen diltiazem (CARDIZEM CD) 240 MG 24 hr capsule Take 1 capsule (240 mg total) by mouth 2 (two) times daily.  60 capsule  12  . fluticasone (FLOVENT HFA) 110 MCG/ACT inhaler Inhale 1 puff into the lungs 2 (two) times daily.      . furosemide (LASIX) 40 MG tablet Take 2 tablets (80 mg total) by mouth 2 (two) times daily.  30 tablet  12  . glyBURIDE (DIABETA) 5 MG tablet Take 5 mg by mouth daily with breakfast.      . insulin aspart (NOVOLOG) 100 UNIT/ML injection Inject 3-15 Units into the skin 3 (three) times daily with meals. Sliding scale as follows: 121-150=3 units 151-200=4 units 201-250=7 units 251-300=9 units 301-350=12 units 351-400=15 units      . insulin glargine (LANTUS) 100 UNIT/ML injection Inject 5 Units into the skin at bedtime.      Marland Kitchen ipratropium-albuterol (DUONEB) 0.5-2.5 (3) MG/3ML SOLN Take 3 mLs by nebulization 4 (four) times daily.      . magnesium hydroxide (MILK OF  MAGNESIA) 400 MG/5ML suspension Take 30 mLs by mouth daily as needed for constipation.      . pantoprazole (PROTONIX) 40 MG tablet Take 1 tablet (40 mg total) by mouth 2 (two) times daily before a meal.  60 tablet  12  . predniSONE (DELTASONE) 10 MG tablet Take 5 mg by mouth daily with breakfast.      . sodium chloride (OCEAN) 0.65 % SOLN nasal spray Place 1 spray into the nose as needed for congestion.    0  . acetaminophen (TYLENOL) 500 MG tablet Take 1,000 mg by mouth every 6 (six) hours as needed. Pain       Scheduled: . albuterol  2.5 mg Nebulization Q4H  . aspirin  81 mg Oral Daily  . ceFEPime (MAXIPIME) IV  2 g Intravenous Q24H  . diltiazem  240 mg Oral BID  .  ferrous fumarate  1 tablet Oral BID  . fluticasone  1 puff Inhalation BID  . furosemide  80 mg Oral BID  . guaiFENesin  600 mg Oral BID  . insulin aspart  0-15 Units Subcutaneous TID WC  . insulin aspart  0-5 Units Subcutaneous QHS  . insulin glargine  5 Units Subcutaneous QHS  . ipratropium  0.5 mg Nebulization Q4H  . pantoprazole  40 mg Oral BID AC  . predniSONE  5 mg Oral Q breakfast  . sodium chloride  3 mL Intravenous Q12H  . sodium chloride  3 mL Intravenous Q12H  . vancomycin  500 mg Intravenous Q12H   Continuous:  GNF:AOZHYQ chloride, acetaminophen, acetaminophen, albuterol, bisacodyl, ondansetron (ZOFRAN) IV, ondansetron, sodium chloride, sodium chloride  Assesment: He has healthcare associated pneumonia. He has been hypoxic from that. He has acute on chronic respiratory failure related to his healthcare associated pneumonia and severe COPD. He has chronic diastolic congestive heart failure but it does not appear that that's playing a large role. He is anemic which is thought to be anemia of chronic disease. He has chronic kidney disease and his renal function is pretty good right now. He has diabetes which is pretty well controlled. He has hypertension which is okay now. Active Problems:   Atrial flutter   COPD  (chronic obstructive pulmonary disease)   Hypertension   CKD (chronic kidney disease) stage 3, GFR 30-59 ml/min   Type 2 diabetes mellitus, uncontrolled   Chronic diastolic congestive heart failure   Acute-on-chronic respiratory failure   HCAP (healthcare-associated pneumonia)   Anemia, normocytic normochromic   PUD (peptic ulcer disease)    Plan: Continue current treatments. I had another long discussion with family and explained to them that he is going to be chronically sick probably for the rest of his life and that he will have periods when he feels very well and periods when he is back in the hospital. Continue with current antibiotics steroids inhaled bronchodilators Lasix etc.    LOS: 2 days   Aryona Sill L 07/31/2013, 8:29 AM

## 2013-07-31 NOTE — Care Management Note (Signed)
    Page 1 of 1   09-07-13     3:37:42 PM   CARE MANAGEMENT NOTE Sep 07, 2013  Patient:  [name] [name], [name] [name]   Account Number:  [id number]  Date Initiated:  07/30/2013  Documentation initiated by:  Rosemary Holms  Subjective/Objective Assessment:   Pt admitted from home where he lives with his wife. Pt has DME needs covered including O2 at home. Currently with [facility]. Plans to DC home with [facility].     Action/Plan:   Anticipated DC Date:     Anticipated DC Plan:        DC Planning Services  CM consult      Choice offered to / List presented to:             Status of service:  Completed, signed off Medicare Important Message given?   (If response is "NO", the following Medicare IM given date fields will be blank) Date Medicare IM given:   Date Additional Medicare IM given:    Discharge Disposition:  EXPIRED  Per UR Regulation:    If discussed at Long Length of Stay Meetings, dates discussed:   08/04/2013  08/06/2013  08/13/2013    Comments:  08/13/13 Rubi Tooley RN BSN CM Anticipate inhospital death  September 03, 2013 Zafar Debrosse RN BSN CM Contunes to need inpt services. Will discuss with PCP tomorrow  08/06/13 Rosemary Holms RN BSN CM Anticipate DC tomorrow with [facility] services.  07/30/13 Rosemary Holms RN BSN CM

## 2013-07-31 NOTE — Progress Notes (Signed)
Attempted to administer nebulizer treatment at 1600 patient had visitors and declined treatment at this time. He stated he was fine and he appeared in no distress. He asked that I return approximately 1715-1730, at that time patient still eating and had company, treatment was not given.

## 2013-07-31 DEATH — deceased

## 2013-08-01 ENCOUNTER — Inpatient Hospital Stay (HOSPITAL_COMMUNITY): Payer: Medicare Other

## 2013-08-01 LAB — BASIC METABOLIC PANEL
CO2: 40 mEq/L (ref 19–32)
Chloride: 90 mEq/L — ABNORMAL LOW (ref 96–112)
Potassium: 3.4 mEq/L — ABNORMAL LOW (ref 3.5–5.1)
Sodium: 137 mEq/L (ref 135–145)

## 2013-08-01 LAB — CBC WITH DIFFERENTIAL/PLATELET
Band Neutrophils: 6 % (ref 0–10)
Eosinophils Absolute: 0 10*3/uL (ref 0.0–0.7)
Eosinophils Relative: 0 % (ref 0–5)
HCT: 27.1 % — ABNORMAL LOW (ref 39.0–52.0)
MCV: 87.1 fL (ref 78.0–100.0)
Metamyelocytes Relative: 1 %
Monocytes Absolute: 1.2 10*3/uL — ABNORMAL HIGH (ref 0.1–1.0)
Monocytes Relative: 16 % — ABNORMAL HIGH (ref 3–12)
Platelets: 240 10*3/uL (ref 150–400)
RBC: 3.11 MIL/uL — ABNORMAL LOW (ref 4.22–5.81)
WBC: 7.6 10*3/uL (ref 4.0–10.5)
nRBC: 0 /100 WBC

## 2013-08-01 LAB — GLUCOSE, CAPILLARY
Glucose-Capillary: 164 mg/dL — ABNORMAL HIGH (ref 70–99)
Glucose-Capillary: 267 mg/dL — ABNORMAL HIGH (ref 70–99)

## 2013-08-01 LAB — VANCOMYCIN, TROUGH: Vancomycin Tr: 19 ug/mL (ref 10.0–20.0)

## 2013-08-01 MED ORDER — METHYLPREDNISOLONE SODIUM SUCC 125 MG IJ SOLR
125.0000 mg | Freq: Four times a day (QID) | INTRAMUSCULAR | Status: DC
  Administered 2013-08-01 – 2013-08-06 (×22): 125 mg via INTRAVENOUS
  Filled 2013-08-01 (×22): qty 2

## 2013-08-01 MED ORDER — MAGNESIUM HYDROXIDE 400 MG/5ML PO SUSP
60.0000 mL | Freq: Once | ORAL | Status: AC
  Administered 2013-08-01: 60 mL via ORAL
  Filled 2013-08-01: qty 60

## 2013-08-01 MED ORDER — INSULIN ASPART 100 UNIT/ML ~~LOC~~ SOLN
3.0000 [IU] | Freq: Three times a day (TID) | SUBCUTANEOUS | Status: DC
  Administered 2013-08-01 – 2013-08-05 (×11): 3 [IU] via SUBCUTANEOUS

## 2013-08-01 MED ORDER — SERTRALINE HCL 50 MG PO TABS
50.0000 mg | ORAL_TABLET | Freq: Every day | ORAL | Status: DC
  Administered 2013-08-01 – 2013-08-11 (×11): 50 mg via ORAL
  Filled 2013-08-01 (×11): qty 1

## 2013-08-01 NOTE — Progress Notes (Signed)
ANTIBIOTIC CONSULT NOTE  Pharmacy Consult for Vancomycin & Cefepime Indication: pneumonia  No Known Allergies  Patient Measurements: Height: 5\' 6"  (167.6 cm) Weight: 155 lb 10.3 oz (70.6 kg) IBW/kg (Calculated) : 63.8  Vital Signs: Temp: 98.3 F (36.8 C) (08/02 0521) Temp src: Oral (08/02 0521) BP: 119/70 mmHg (08/02 0521) Pulse Rate: 102 (08/02 0521) Intake/Output from previous day: 08/01 0701 - 08/02 0700 In: 720 [P.O.:720] Out: 1400 [Urine:1400] Intake/Output from this shift: Total I/O In: 123 [P.O.:120; I.V.:3] Out: 350 [Urine:350]  Labs:  Recent Labs  07/30/13 0512 07/31/13 0442 08/01/13 0635  WBC 7.6 8.2 7.6  HGB 7.8* 8.1* 8.1*  PLT 198 212 240  CREATININE 1.43* 1.33 1.54*   Estimated Creatinine Clearance: 37.4 ml/min (by C-G formula based on Cr of 1.54).  Recent Labs  08/01/13 1040  VANCOTROUGH 19.0     Microbiology: Recent Results (from the past 720 hour(s))  CULTURE, EXPECTORATED SPUTUM-ASSESSMENT     Status: None   Collection Time    07/30/13  6:56 AM      Result Value Range Status   Specimen Description SPUTUM EXPECTORATED   Final   Special Requests NONE   Final   Sputum evaluation     Final   Value: MICROSCOPIC FINDINGS SUGGEST THAT THIS SPECIMEN IS NOT REPRESENTATIVE OF LOWER RESPIRATORY SECRETIONS. PLEASE RECOLLECT.     Results Called to: [facility] AT 1119 ON 07/30/2013 BY BAUGHAM,M.     Performed at [facility]   Report Status 07/30/2013 FINAL   Final    Medical History: Past Medical History  Diagnosis Date  . COPD (chronic obstructive pulmonary disease)   . AF (atrial fibrillation)   . Hypertension   . Gout   . Arthritis   . Small bowel obstruction   . Renal insufficiency   . Aortic stenosis   . Hydropneumothorax   . Diabetes mellitus   . CHF (congestive heart failure)   . On home O2     qhs prn  . Pneumonia 05/04/2013    history of pneumonia  . Pneumonia 10/12  . Hydropneumothorax 2012    Medications:   Scheduled:  . albuterol  2.5 mg Nebulization Q4H  . aspirin  81 mg Oral Daily  . ceFEPime (MAXIPIME) IV  2 g Intravenous Q24H  . diltiazem  240 mg Oral BID  . ferrous fumarate  1 tablet Oral BID  . fluticasone  1 puff Inhalation BID  . furosemide  80 mg Oral BID  . guaiFENesin  600 mg Oral BID  . insulin aspart  0-15 Units Subcutaneous TID WC  . insulin aspart  0-5 Units Subcutaneous QHS  . insulin aspart  3 Units Subcutaneous TID WC  . insulin glargine  5 Units Subcutaneous QHS  . ipratropium  0.5 mg Nebulization Q4H  . methylPREDNISolone (SOLU-MEDROL) injection  125 mg Intravenous Q6H  . pantoprazole  40 mg Oral BID AC  . sertraline  50 mg Oral Daily  . sodium chloride  3 mL Intravenous Q12H  . sodium chloride  3 mL Intravenous Q12H  . vancomycin  500 mg Intravenous Q12H   Assessment: 77 yo M who was recently hospitalized presents with developing PNA.  He is on broad-spectrum antibiotics.    Estimated Creatinine Clearance: 37.4 ml/min (by C-G formula based on Cr of 1.54). Trough at goal. No micro data.  Zosyn 7/30 x 1 Cefepime 7/30 >> Vancomycin 7/30 >>  Goal of Therapy:  Vancomycin trough level 15-20 mcg/ml  Plan:  1)  Continue Cefepime 2gm IV q24h 2) Continue Vancomycin 500mg  IV q12h 3) Weekly Vancomycin trough if continues. 4) Monitor renal function and cx data   Mady Gemma 08/01/2013,1:34 PM

## 2013-08-01 NOTE — Progress Notes (Signed)
CRITICAL VALUE ALERT  Critical value received:  CO2 40  Date of notification:  08/01/13  Time of notification:  0715  Critical value read back:yes  Nurse who received alert:  Doug Sou, RN  MD notified (1st page):  [name]  Time of first page:  0720    MD notified (2nd page):  Time of second page:  Responding MD:  [name]  Time MD responded:  0730

## 2013-08-01 NOTE — Progress Notes (Signed)
Subjective: He says he is more short of breath today. He did fairly well yesterday. Chest x-ray today I think shows some reaccumulation of pleural fluid. He is coughing but only producing creamy sputum.  Objective: Vital signs in last 24 hours: Temp:  [98.3 F (36.8 C)] 98.3 F (36.8 C) (08/02 0521) Pulse Rate:  [102-105] 102 (08/02 0521) Resp:  [20] 20 (08/02 0521) BP: (112-122)/(70-76) 119/70 mmHg (08/02 0521) SpO2:  [91 %-97 %] 93 % (08/02 0724) Weight:  [70.6 kg (155 lb 10.3 oz)] 70.6 kg (155 lb 10.3 oz) (08/02 0650) Weight change: -0.479 kg (-1 lb 0.9 oz) Last BM Date: 07/31/13  Intake/Output from previous day: 08/01 0701 - 08/02 0700 In: 720 [P.O.:720] Out: 1400 [Urine:1400]  PHYSICAL EXAM General appearance: alert, cooperative and mild distress Resp: rhonchi bilaterally Cardio: regular rate and rhythm, S1, S2 normal, no murmur, click, rub or gallop GI: soft, non-tender; bowel sounds normal; no masses,  no organomegaly Extremities: edema Of his feet  Lab Results:    Basic Metabolic Panel:  Recent Labs  16/10/96 0442 08/01/13 0635  NA 139 137  K 3.6 3.4*  CL 92* 90*  CO2 40* 40*  GLUCOSE 168* 168*  BUN 35* 34*  CREATININE 1.33 1.54*  CALCIUM 9.1 9.0   Liver Function Tests: No results found for this basename: AST, ALT, ALKPHOS, BILITOT, PROT, ALBUMIN,  in the last 72 hours No results found for this basename: LIPASE, AMYLASE,  in the last 72 hours No results found for this basename: AMMONIA,  in the last 72 hours CBC:  Recent Labs  07/31/13 0442 08/01/13 0635  WBC 8.2 7.6  NEUTROABS 3.7 4.3  HGB 8.1* 8.1*  HCT 26.7* 27.1*  MCV 88.7 87.1  PLT 212 240   Cardiac Enzymes:  Recent Labs  08/10/13 1405  TROPONINI <0.30   BNP:  Recent Labs  10-Aug-2013 1405  PROBNP 2906.0*   D-Dimer: No results found for this basename: DDIMER,  in the last 72 hours CBG:  Recent Labs  07/30/13 2044 07/31/13 0720 07/31/13 1137 07/31/13 1701 07/31/13 2109  08/01/13 0715  GLUCAP 142* 159* 277* 199* 198* 164*   Hemoglobin A1C:  Recent Labs  10-Aug-2013 1405  HGBA1C 5.1   Fasting Lipid Panel: No results found for this basename: CHOL, HDL, LDLCALC, TRIG, CHOLHDL, LDLDIRECT,  in the last 72 hours Thyroid Function Tests: No results found for this basename: TSH, T4TOTAL, FREET4, T3FREE, THYROIDAB,  in the last 72 hours Anemia Panel:  Recent Labs  08-10-13 1405  VITAMINB12 300  FOLATE 15.7  FERRITIN 833*  TIBC 230  IRON 12*  RETICCTPCT 5.4*   Coagulation: No results found for this basename: LABPROT, INR,  in the last 72 hours Urine Drug Screen: Drugs of Abuse  No results found for this basename: labopia, cocainscrnur, labbenz, amphetmu, thcu, labbarb    Alcohol Level: No results found for this basename: ETH,  in the last 72 hours Urinalysis: No results found for this basename: COLORURINE, APPERANCEUR, LABSPEC, PHURINE, GLUCOSEU, HGBUR, BILIRUBINUR, KETONESUR, PROTEINUR, UROBILINOGEN, NITRITE, LEUKOCYTESUR,  in the last 72 hours Misc. Labs:  ABGS No results found for this basename: PHART, PCO2, PO2ART, TCO2, HCO3,  in the last 72 hours CULTURES Recent Results (from the past 240 hour(s))  CULTURE, EXPECTORATED SPUTUM-ASSESSMENT     Status: None   Collection Time    07/30/13  6:56 AM      Result Value Range Status   Specimen Description SPUTUM EXPECTORATED   Final   Special  Requests NONE   Final   Sputum evaluation     Final   Value: MICROSCOPIC FINDINGS SUGGEST THAT THIS SPECIMEN IS NOT REPRESENTATIVE OF LOWER RESPIRATORY SECRETIONS. PLEASE RECOLLECT.     Results Called to: [facility] AT 1119 ON 07/30/2013 BY BAUGHAM,M.     Performed at [facility]   Report Status 07/30/2013 FINAL   Final   Studies/Results: Dg Chest Port 1 View  08/01/2013   *RADIOLOGY REPORT*  Clinical Data: Pneumonia.  PORTABLE CHEST - 1 VIEW  Comparison: 07/30/2013.  Findings: No change in patchy bilateral consolidation.  There is a small to  moderate left-sided pleural effusion.  Unchanged cardiopericardial enlargement.  Previous median sternotomy for aortic valve replacement.  No evident pneumothorax.  IMPRESSION: 1. Stable exam compared to yesterday. Bilateral opacities is likely pneumonia.  2.  Left pleural effusion and possible background of edema.   Original Report Authenticated By: Tiburcio Pea    Medications:  Prior to Admission:  Prescriptions prior to admission  Medication Sig Dispense Refill  . aspirin 81 MG chewable tablet Chew 81 mg by mouth daily.      Marland Kitchen diltiazem (CARDIZEM CD) 240 MG 24 hr capsule Take 1 capsule (240 mg total) by mouth 2 (two) times daily.  60 capsule  12  . fluticasone (FLOVENT HFA) 110 MCG/ACT inhaler Inhale 1 puff into the lungs 2 (two) times daily.      . furosemide (LASIX) 40 MG tablet Take 2 tablets (80 mg total) by mouth 2 (two) times daily.  30 tablet  12  . glyBURIDE (DIABETA) 5 MG tablet Take 5 mg by mouth daily with breakfast.      . insulin aspart (NOVOLOG) 100 UNIT/ML injection Inject 3-15 Units into the skin 3 (three) times daily with meals. Sliding scale as follows: 121-150=3 units 151-200=4 units 201-250=7 units 251-300=9 units 301-350=12 units 351-400=15 units      . insulin glargine (LANTUS) 100 UNIT/ML injection Inject 5 Units into the skin at bedtime.      Marland Kitchen ipratropium-albuterol (DUONEB) 0.5-2.5 (3) MG/3ML SOLN Take 3 mLs by nebulization 4 (four) times daily.      . magnesium hydroxide (MILK OF MAGNESIA) 400 MG/5ML suspension Take 30 mLs by mouth daily as needed for constipation.      . pantoprazole (PROTONIX) 40 MG tablet Take 1 tablet (40 mg total) by mouth 2 (two) times daily before a meal.  60 tablet  12  . predniSONE (DELTASONE) 10 MG tablet Take 5 mg by mouth daily with breakfast.      . sodium chloride (OCEAN) 0.65 % SOLN nasal spray Place 1 spray into the nose as needed for congestion.    0  . acetaminophen (TYLENOL) 500 MG tablet Take 1,000 mg by mouth every 6 (six)  hours as needed. Pain       Scheduled: . albuterol  2.5 mg Nebulization Q4H  . aspirin  81 mg Oral Daily  . ceFEPime (MAXIPIME) IV  2 g Intravenous Q24H  . diltiazem  240 mg Oral BID  . ferrous fumarate  1 tablet Oral BID  . fluticasone  1 puff Inhalation BID  . furosemide  80 mg Oral BID  . guaiFENesin  600 mg Oral BID  . insulin aspart  0-15 Units Subcutaneous TID WC  . insulin aspart  0-5 Units Subcutaneous QHS  . insulin aspart  3 Units Subcutaneous TID WC  . insulin glargine  5 Units Subcutaneous QHS  . ipratropium  0.5 mg Nebulization Q4H  .  methylPREDNISolone (SOLU-MEDROL) injection  125 mg Intravenous Q6H  . pantoprazole  40 mg Oral BID AC  . sertraline  50 mg Oral Daily  . sodium chloride  3 mL Intravenous Q12H  . sodium chloride  3 mL Intravenous Q12H  . vancomycin  500 mg Intravenous Q12H   Continuous:  FAO:ZHYQMV chloride, acetaminophen, acetaminophen, albuterol, bisacodyl, ondansetron (ZOFRAN) IV, ondansetron, sodium chloride, sodium chloride  Assesment: He was admitted with healthcare associated pneumonia. He has recurrent pleural effusion. I think the pleural effusion is causing him to have more shortness of breath now he does have acute on chronic respiratory failure which may be slightly worse. He has COPD which is severe. He has chronic diastolic CHF but I don't think that's a significant portion of what's happening to him now. He has chronic kidney disease which is pretty stable. He has hypertension well controlled. He has diabetes and I have modified his medications for that. He has anemia probably multifactorial including anemia of chronic disease and he has had peptic ulcer disease and some GI bleeding. This is chronic. Active Problems:   Atrial flutter   COPD (chronic obstructive pulmonary disease)   Hypertension   CKD (chronic kidney disease) stage 3, GFR 30-59 ml/min   Type 2 diabetes mellitus, uncontrolled   Chronic diastolic congestive heart failure    Acute-on-chronic respiratory failure   HCAP (healthcare-associated pneumonia)   Anemia, normocytic normochromic   PUD (peptic ulcer disease)    Plan: I'm going to put him on IV steroids now. He's been on IV antibiotics for about 48 hours. I discussed his situation with Dr. Archer Asa interventional radiologist and he appears to be a candidate for pleural catheter. This could be done on the fourth. I discussed at length with the patient and his wife and his daughter the risks including infection, pneumothorax, bleeding and they understand but wished to proceed    LOS: 3 days   Pleshette Tomasini L 08/01/2013, 9:47 AM

## 2013-08-01 NOTE — Progress Notes (Signed)
Pt's SAT dropped in to 80's nurse notified, RT pt given breathing treatment early. He still is very congested with weak cough, suspect slight volume problem even though IV is only running at 10cc /hour. Also his mucus is very thick. He becomes Sob with head flat. SAT 93 on 6lpm/

## 2013-08-02 LAB — GLUCOSE, CAPILLARY
Glucose-Capillary: 232 mg/dL — ABNORMAL HIGH (ref 70–99)
Glucose-Capillary: 331 mg/dL — ABNORMAL HIGH (ref 70–99)

## 2013-08-02 LAB — OCCULT BLOOD X 1 CARD TO LAB, STOOL: Fecal Occult Bld: NEGATIVE

## 2013-08-02 NOTE — Progress Notes (Signed)
[id number]  

## 2013-08-03 ENCOUNTER — Inpatient Hospital Stay (HOSPITAL_COMMUNITY): Payer: Medicare Other

## 2013-08-03 ENCOUNTER — Other Ambulatory Visit (HOSPITAL_COMMUNITY): Payer: Medicare Other

## 2013-08-03 LAB — HEMOGLOBIN AND HEMATOCRIT, BLOOD: Hemoglobin: 10.3 g/dL — ABNORMAL LOW (ref 13.0–17.0)

## 2013-08-03 LAB — GLUCOSE, CAPILLARY: Glucose-Capillary: 327 mg/dL — ABNORMAL HIGH (ref 70–99)

## 2013-08-03 LAB — BASIC METABOLIC PANEL
Calcium: 9.8 mg/dL (ref 8.4–10.5)
GFR calc non Af Amer: 30 mL/min — ABNORMAL LOW (ref >90)
Sodium: 134 mEq/L — ABNORMAL LOW (ref 135–145)

## 2013-08-03 LAB — CBC
Platelets: 258 10*3/uL (ref 150–400)
RBC: 2.68 MIL/uL — ABNORMAL LOW (ref 4.22–5.81)
RDW: 18.6 % — ABNORMAL HIGH (ref 11.5–15.5)
WBC: 6.3 10*3/uL (ref 4.0–10.5)

## 2013-08-03 LAB — PROTIME-INR: Prothrombin Time: 15.6 seconds — ABNORMAL HIGH (ref 11.6–15.2)

## 2013-08-03 LAB — APTT: aPTT: 32 seconds (ref 24–37)

## 2013-08-03 LAB — PREPARE RBC (CROSSMATCH)

## 2013-08-03 MED ORDER — VANCOMYCIN HCL IN DEXTROSE 750-5 MG/150ML-% IV SOLN
750.0000 mg | INTRAVENOUS | Status: DC
  Administered 2013-08-03 – 2013-08-06 (×4): 750 mg via INTRAVENOUS
  Filled 2013-08-03 (×9): qty 150

## 2013-08-03 MED ORDER — ACETAMINOPHEN 325 MG PO TABS
650.0000 mg | ORAL_TABLET | Freq: Once | ORAL | Status: AC
  Administered 2013-08-03: 650 mg via ORAL
  Filled 2013-08-03: qty 2

## 2013-08-03 MED ORDER — DIPHENHYDRAMINE HCL 25 MG PO CAPS
25.0000 mg | ORAL_CAPSULE | Freq: Once | ORAL | Status: AC
  Administered 2013-08-03: 25 mg via ORAL
  Filled 2013-08-03: qty 1

## 2013-08-03 MED ORDER — FUROSEMIDE 10 MG/ML IJ SOLN
80.0000 mg | Freq: Once | INTRAMUSCULAR | Status: AC
  Administered 2013-08-03: 80 mg via INTRAVENOUS
  Filled 2013-08-03: qty 8

## 2013-08-03 MED ORDER — FUROSEMIDE 10 MG/ML IJ SOLN
40.0000 mg | Freq: Two times a day (BID) | INTRAMUSCULAR | Status: DC
  Administered 2013-08-03 – 2013-08-05 (×4): 40 mg via INTRAVENOUS
  Filled 2013-08-03 (×4): qty 4

## 2013-08-03 MED ORDER — FUROSEMIDE 10 MG/ML IJ SOLN
20.0000 mg | Freq: Once | INTRAMUSCULAR | Status: AC
  Administered 2013-08-03: 20 mg via INTRAVENOUS
  Filled 2013-08-03: qty 2

## 2013-08-03 NOTE — Progress Notes (Signed)
[name] [name] is active with [facility] Care Management services at home. [facility] Community Care Coordinator will follow up post hospital discharge.  Raiford Noble, MSN-Ed, RN,BSN- [facility] [phone]

## 2013-08-03 NOTE — Progress Notes (Signed)
Patient ID: [name] [name], male   DOB: 1936-03-20, 77 y.o.   MRN: [id number]    PleurX catheter has been postponed today Secondary to breathing issues and drop in Hgb  We will recheck status in am  Keep npo after MN tonight- in case

## 2013-08-03 NOTE — Progress Notes (Signed)
Patient to go to [facility] for pleural catheter placement via carelink. Notified this am that carelink had a two hour delay, Interventional radiology department notified spoke with Pearlean Brownie RT,also spoke with Dr [name]. Orders received and given, will continue to monitor patient.Family at bedside.

## 2013-08-03 NOTE — Progress Notes (Signed)
Inpatient Diabetes Program Recommendations  AACE/ADA: New Consensus Statement on Inpatient Glycemic Control (2013)  Target Ranges:  Prepandial:   less than 140 mg/dL      Peak postprandial:   less than 180 mg/dL (1-2 hours)      Critically ill patients:  140 - 180 mg/dL   Results for [name] [name], [name] [name] (MRN [id number]) as of 08/03/2013 09:16  Ref. Range 08/02/2013 07:07 08/02/2013 11:49 08/02/2013 16:36 08/02/2013 21:46 08/03/2013 07:25  Glucose-Capillary Latest Range: 70-99 mg/dL 409 (H) 811 (H) 914 (H) 331 (H) 308 (H)    Inpatient Diabetes Program Recommendations Insulin - Basal: Please consider increasing Lantus to 10 units QHS. Insulin - Meal Coverage: Please conisder increasing Novolog meal coverage to 5 units TID with meals.  Note: Patient has a history of diabetes and takes Lantus 5 units QHS, Novolog sliding scale TID, and Glyburide 5 mg QAM at home for diabetes management.  Currently, patient is ordered to receive Lantus 5 units QHS, Novolog 0-15 units AC, Novolog 0-5 units HS, and Novolog 3 units TID with meals for inpatient glycemic control.  Note that patient is receiving Solumedrol 125 mg IV Q6H which is contributing to hyperglycemia.  Blood glucose over the past 24 hours has ranged from 232-331 mg/dl and fasting blood glucose this morning was 308 mg/dl.  Please consider increasing Lantus to 10 units QHS and increasing Novolog meal coverage to 5 units TID with meals (if patient is eating at least 50% of meals).  Will continue to follow.  Thanks, Orlando Penner, RN, MSN, CCRN Diabetes Coordinator Inpatient Diabetes Program [phone]

## 2013-08-03 NOTE — Progress Notes (Signed)
ANTIBIOTIC CONSULT NOTE  Pharmacy Consult for Vancomycin & Cefepime Indication: pneumonia  No Known Allergies  Patient Measurements: Height: 5\' 6"  (167.6 cm) Weight: 157 lb 9.6 oz (71.487 kg) IBW/kg (Calculated) : 63.8  Vital Signs: Temp: 97.6 F (36.4 C) (08/04 0627) Temp src: Oral (08/04 0627) BP: 123/74 mmHg (08/04 0627) Pulse Rate: 94 (08/03 2308) Intake/Output from previous day: 08/03 0701 - 08/04 0700 In: 1343 [P.O.:860; I.V.:233; IV Piggyback:250] Out: 1451 [Urine:1450; Stool:1] Intake/Output from this shift:    Labs:  Recent Labs  08/01/13 0635 08/03/13 0457  WBC 7.6 6.3  HGB 8.1* 7.2*  PLT 240 258  CREATININE 1.54* 2.04*   Estimated Creatinine Clearance: 28.2 ml/min (by C-G formula based on Cr of 2.04).  Recent Labs  08/01/13 1040  VANCOTROUGH 19.0    Microbiology: Recent Results (from the past 720 hour(s))  CULTURE, EXPECTORATED SPUTUM-ASSESSMENT     Status: None   Collection Time    07/30/13  6:56 AM      Result Value Range Status   Specimen Description SPUTUM EXPECTORATED   Final   Special Requests NONE   Final   Sputum evaluation     Final   Value: MICROSCOPIC FINDINGS SUGGEST THAT THIS SPECIMEN IS NOT REPRESENTATIVE OF LOWER RESPIRATORY SECRETIONS. PLEASE RECOLLECT.     Results Called to: [facility]. AT 1119 ON 07/30/2013 BY BAUGHAM,M.     Performed at [facility]   Report Status 07/30/2013 FINAL   Final   Medical History: Past Medical History  Diagnosis Date  . COPD (chronic obstructive pulmonary disease)   . AF (atrial fibrillation)   . Hypertension   . Gout   . Arthritis   . Small bowel obstruction   . Renal insufficiency   . Aortic stenosis   . Hydropneumothorax   . Diabetes mellitus   . CHF (congestive heart failure)   . On home O2     qhs prn  . Pneumonia 05/04/2013    history of pneumonia  . Pneumonia 10/12  . Hydropneumothorax 2012   Medications:  Scheduled:  . albuterol  2.5 mg Nebulization Q4H  . aspirin   81 mg Oral Daily  . ceFEPime (MAXIPIME) IV  2 g Intravenous Q24H  . diltiazem  240 mg Oral BID  . ferrous fumarate  1 tablet Oral BID  . fluticasone  1 puff Inhalation BID  . furosemide  80 mg Oral BID  . guaiFENesin  600 mg Oral BID  . insulin aspart  0-15 Units Subcutaneous TID WC  . insulin aspart  0-5 Units Subcutaneous QHS  . insulin aspart  3 Units Subcutaneous TID WC  . insulin glargine  5 Units Subcutaneous QHS  . ipratropium  0.5 mg Nebulization Q4H  . methylPREDNISolone (SOLU-MEDROL) injection  125 mg Intravenous Q6H  . pantoprazole  40 mg Oral BID AC  . sertraline  50 mg Oral Daily  . sodium chloride  3 mL Intravenous Q12H  . sodium chloride  3 mL Intravenous Q12H  . vancomycin  750 mg Intravenous Q24H   Assessment: 77 yo M who was recently hospitalized presents with developing PNA.  He is on broad-spectrum antibiotics.    Estimated Creatinine Clearance: 28.2 ml/min (by C-G formula based on Cr of 2.04). Trough at goal but on high end. No micro data.  SCr is worse today.  Zosyn 7/30 x 1 Cefepime 7/30 >> Vancomycin 7/30 >>  Goal of Therapy:  Vancomycin trough level 15-20 mcg/ml  Plan:  1) Continue Cefepime 2gm  IV q24h 2) Adjust Vancomycin to 750mg  IV q24hrs to discourage elevated trough levels. 3) Weekly Vancomycin trough if continues. 4) Monitor renal function and cx data per guidelines  Valrie Hart A 08/03/2013,8:10 AM

## 2013-08-03 NOTE — Progress Notes (Signed)
NAME:  [name] [name], [name] [name]               ACCOUNT NO.:  [id number]  MEDICAL RECORD NO.:  [id number]  LOCATION:  A326                          FACILITY:  [facility]  PHYSICIAN:  Melvyn Novas, MDDATE OF BIRTH:  [birth year]  DATE OF PROCEDURE: DATE OF DISCHARGE:                                PROGRESS NOTE   A 77 year old patient of Dr. [name].  Current problems are recurrent pleural effusion, anemia with iron deficiency, presumed multifactorial status post porcine aortic valve replacement, diabetes, hypertension, COPD.  The patient is scheduled for a pleural drain insertion in a.m. in [facility].  Blood pressure is 118/73, temperature 97.5, pulse 86 and regular, respiratory rate is 20.  The patient is sitting in chair, fairly comfortable.  Hemoglobin 8.1, stable.  Potassium 3.4, creatinine 1.54.  Iron studies reveal a normal TIBC; however, low iron at 12 and ferritin 600, acute phase reactant presumably.  His anemia may be multifactorial; however, there is iron deficiency, currently on supplementation.  Stools for occult blood pending.  Currently, on Solu- Medrol 125 IV q.6h. as well as Maxipime and vancomycin.  Chest x-ray yesterday revealed patchy infiltrates present bilaterally, presumably pneumonia and unchanged with a small-to-moderate left-sided pleural effusion.  Lungs show bilateral basilar crepitations, scattered rhonchi bilaterally, prolonged inspiratory as well as expiratory phase.  No wheeze audible.  Heart, regular rhythm.  No S3 auscultated.  No heaves, thrills, or rubs.  The patient has fair glycemic control.  Currently with Atrovent as well as Proventil nebulizer q.4h.  The plan right now is continue current therapy, Solu-Medrol, Maxipime, vancomycin, and iron therapy.  Await stools samples, sent already and transfer tomorrow for pleural drain.     Melvyn Novas, MD     RMD/MEDQ  D:  08/02/2013  T:  08/02/2013  Job:  [id number]

## 2013-08-03 NOTE — Progress Notes (Signed)
Subjective: He had more trouble with his breathing last night. He received extra Lasix. This morning his renal function is not as good and his hemoglobin dropped by approximately 1 g. He was negative for occult blood. He has had to be placed on a partial nonrebreather mask.  Objective: Vital signs in last 24 hours: Temp:  [97.3 F (36.3 C)-97.7 F (36.5 C)] 97.6 F (36.4 C) (08/04 0627) Pulse Rate:  [94] 94 (08/03 2308) Resp:  [20] 20 (08/04 0627) BP: (123-136)/(68-74) 123/74 mmHg (08/04 0627) SpO2:  [88 %-100 %] 100 % (08/04 0802) FiO2 (%):  [100 %] 100 % (08/04 0802) Weight:  [71.487 kg (157 lb 9.6 oz)] 71.487 kg (157 lb 9.6 oz) (08/04 0627) Weight change: 0.998 kg (2 lb 3.2 oz) Last BM Date: 08/02/13  Intake/Output from previous day: 08/03 0701 - 08/04 0700 In: 1343 [P.O.:860; I.V.:233; IV Piggyback:250] Out: 1451 [Urine:1450; Stool:1]  PHYSICAL EXAM General appearance: alert, cooperative and moderate distress Resp: clear to auscultation bilaterally Cardio: regular rate and rhythm, S1, S2 normal, no murmur, click, rub or gallop GI: soft, non-tender; bowel sounds normal; no masses,  no organomegaly Extremities: extremities normal, atraumatic, no cyanosis or edema  Lab Results:    Basic Metabolic Panel:  Recent Labs  21/30/86 0635 08/02/13 1126 08/03/13 0457  NA 137  --  134*  K 3.4*  --  4.4  CL 90*  --  89*  CO2 40*  --  36*  GLUCOSE 168*  --  323*  BUN 34*  --  66*  CREATININE 1.54*  --  2.04*  CALCIUM 9.0  --  9.8  MG  --  2.9*  --    Liver Function Tests: No results found for this basename: AST, ALT, ALKPHOS, BILITOT, PROT, ALBUMIN,  in the last 72 hours No results found for this basename: LIPASE, AMYLASE,  in the last 72 hours No results found for this basename: AMMONIA,  in the last 72 hours CBC:  Recent Labs  08/01/13 0635 08/03/13 0457  WBC 7.6 6.3  NEUTROABS 4.3  --   HGB 8.1* 7.2*  HCT 27.1* 23.2*  MCV 87.1 86.6  PLT 240 258   Cardiac  Enzymes: No results found for this basename: CKTOTAL, CKMB, CKMBINDEX, TROPONINI,  in the last 72 hours BNP: No results found for this basename: PROBNP,  in the last 72 hours D-Dimer: No results found for this basename: DDIMER,  in the last 72 hours CBG:  Recent Labs  08/01/13 2015 08/02/13 0707 08/02/13 1149 08/02/13 1636 08/02/13 2146 08/03/13 0725  GLUCAP 267* 285* 305* 232* 331* 308*   Hemoglobin A1C: No results found for this basename: HGBA1C,  in the last 72 hours Fasting Lipid Panel: No results found for this basename: CHOL, HDL, LDLCALC, TRIG, CHOLHDL, LDLDIRECT,  in the last 72 hours Thyroid Function Tests: No results found for this basename: TSH, T4TOTAL, FREET4, T3FREE, THYROIDAB,  in the last 72 hours Anemia Panel: No results found for this basename: VITAMINB12, FOLATE, FERRITIN, TIBC, IRON, RETICCTPCT,  in the last 72 hours Coagulation:  Recent Labs  08/03/13 0457  LABPROT 15.6*  INR 1.27   Urine Drug Screen: Drugs of Abuse  No results found for this basename: labopia, cocainscrnur, labbenz, amphetmu, thcu, labbarb    Alcohol Level: No results found for this basename: ETH,  in the last 72 hours Urinalysis: No results found for this basename: COLORURINE, APPERANCEUR, LABSPEC, PHURINE, GLUCOSEU, HGBUR, BILIRUBINUR, KETONESUR, PROTEINUR, UROBILINOGEN, NITRITE, LEUKOCYTESUR,  in the last  72 hours Misc. Labs:  ABGS No results found for this basename: PHART, PCO2, PO2ART, TCO2, HCO3,  in the last 72 hours CULTURES Recent Results (from the past 240 hour(s))  CULTURE, EXPECTORATED SPUTUM-ASSESSMENT     Status: None   Collection Time    07/30/13  6:56 AM      Result Value Range Status   Specimen Description SPUTUM EXPECTORATED   Final   Special Requests NONE   Final   Sputum evaluation     Final   Value: MICROSCOPIC FINDINGS SUGGEST THAT THIS SPECIMEN IS NOT REPRESENTATIVE OF LOWER RESPIRATORY SECRETIONS. PLEASE RECOLLECT.     Results Called to: [facility].  AT 1119 ON 07/30/2013 BY BAUGHAM,M.     Performed at [facility]   Report Status 07/30/2013 FINAL   Final   Studies/Results: No results found.  Medications:  Prior to Admission:  Prescriptions prior to admission  Medication Sig Dispense Refill  . aspirin 81 MG chewable tablet Chew 81 mg by mouth daily.      Marland Kitchen diltiazem (CARDIZEM CD) 240 MG 24 hr capsule Take 1 capsule (240 mg total) by mouth 2 (two) times daily.  60 capsule  12  . fluticasone (FLOVENT HFA) 110 MCG/ACT inhaler Inhale 1 puff into the lungs 2 (two) times daily.      . furosemide (LASIX) 40 MG tablet Take 2 tablets (80 mg total) by mouth 2 (two) times daily.  30 tablet  12  . glyBURIDE (DIABETA) 5 MG tablet Take 5 mg by mouth daily with breakfast.      . insulin aspart (NOVOLOG) 100 UNIT/ML injection Inject 3-15 Units into the skin 3 (three) times daily with meals. Sliding scale as follows: 121-150=3 units 151-200=4 units 201-250=7 units 251-300=9 units 301-350=12 units 351-400=15 units      . insulin glargine (LANTUS) 100 UNIT/ML injection Inject 5 Units into the skin at bedtime.      Marland Kitchen ipratropium-albuterol (DUONEB) 0.5-2.5 (3) MG/3ML SOLN Take 3 mLs by nebulization 4 (four) times daily.      . magnesium hydroxide (MILK OF MAGNESIA) 400 MG/5ML suspension Take 30 mLs by mouth daily as needed for constipation.      . pantoprazole (PROTONIX) 40 MG tablet Take 1 tablet (40 mg total) by mouth 2 (two) times daily before a meal.  60 tablet  12  . predniSONE (DELTASONE) 10 MG tablet Take 5 mg by mouth daily with breakfast.      . sodium chloride (OCEAN) 0.65 % SOLN nasal spray Place 1 spray into the nose as needed for congestion.    0  . acetaminophen (TYLENOL) 500 MG tablet Take 1,000 mg by mouth every 6 (six) hours as needed. Pain       Scheduled: . acetaminophen  650 mg Oral Once  . albuterol  2.5 mg Nebulization Q4H  . aspirin  81 mg Oral Daily  . ceFEPime (MAXIPIME) IV  2 g Intravenous Q24H  . diltiazem  240 mg  Oral BID  . diphenhydrAMINE  25 mg Oral Once  . ferrous fumarate  1 tablet Oral BID  . fluticasone  1 puff Inhalation BID  . furosemide  40 mg Intravenous Q12H  . furosemide  80 mg Intravenous Once  . guaiFENesin  600 mg Oral BID  . insulin aspart  0-15 Units Subcutaneous TID WC  . insulin aspart  0-5 Units Subcutaneous QHS  . insulin aspart  3 Units Subcutaneous TID WC  . insulin glargine  5 Units Subcutaneous QHS  .  ipratropium  0.5 mg Nebulization Q4H  . methylPREDNISolone (SOLU-MEDROL) injection  125 mg Intravenous Q6H  . pantoprazole  40 mg Oral BID AC  . sertraline  50 mg Oral Daily  . sodium chloride  3 mL Intravenous Q12H  . sodium chloride  3 mL Intravenous Q12H  . vancomycin  750 mg Intravenous Q24H   Continuous:  GNF:AOZHYQ chloride, acetaminophen, acetaminophen, albuterol, bisacodyl, ondansetron (ZOFRAN) IV, ondansetron, sodium chloride, sodium chloride  Assesment: He has worsening respiratory status. Although some of this may be related to his pleural effusion it may also be worsening heart failure at least partially caused by his worsening anemia. He is negative for blood so I don't think this is from acute bleeding. His COPD is stable. His blood sugar is doing okay. Active Problems:   Atrial flutter   COPD (chronic obstructive pulmonary disease)   Hypertension   CKD (chronic kidney disease) stage 3, GFR 30-59 ml/min   Type 2 diabetes mellitus, uncontrolled   Chronic diastolic congestive heart failure   Acute-on-chronic respiratory failure   HCAP (healthcare-associated pneumonia)   Anemia, normocytic normochromic   PUD (peptic ulcer disease)    Plan: I will switch his Lasix to IV. He will be transfused 2 units packed red blood cells. I'm going to cancel his pleural catheter.    LOS: 5 days   Inice Sanluis L 08/03/2013, 8:32 AM

## 2013-08-03 NOTE — Progress Notes (Signed)
PT Cancellation Note  Patient Details Name: [name] [name] MRN: [id number] DOB: [birth year]   Cancelled Treatment:    Reason Eval/Treat Not Completed: Medical issues which prohibited therapy Pt now on O2 rebreather and is to receive 2 units of blood.  Pt is not able to participate in PT today.  Will monitor daily.  Myrlene Broker L 08/03/2013, 11:01 AM

## 2013-08-04 ENCOUNTER — Encounter (HOSPITAL_COMMUNITY): Payer: Self-pay | Admitting: Radiology

## 2013-08-04 ENCOUNTER — Ambulatory Visit (HOSPITAL_COMMUNITY): Payer: Medicare Other

## 2013-08-04 LAB — CBC WITH DIFFERENTIAL/PLATELET
Eosinophils Absolute: 0 10*3/uL (ref 0.0–0.7)
Eosinophils Relative: 0 % (ref 0–5)
HCT: 30.7 % — ABNORMAL LOW (ref 39.0–52.0)
Hemoglobin: 9.6 g/dL — ABNORMAL LOW (ref 13.0–17.0)
Lymphocytes Relative: 4 % — ABNORMAL LOW (ref 12–46)
Lymphs Abs: 0.3 10*3/uL — ABNORMAL LOW (ref 0.7–4.0)
MCH: 27.1 pg (ref 26.0–34.0)
MCV: 86.7 fL (ref 78.0–100.0)
Monocytes Relative: 8 % (ref 3–12)
RBC: 3.54 MIL/uL — ABNORMAL LOW (ref 4.22–5.81)
WBC: 6.2 10*3/uL (ref 4.0–10.5)

## 2013-08-04 LAB — TYPE AND SCREEN
Antibody Screen: NEGATIVE
Unit division: 0

## 2013-08-04 LAB — BASIC METABOLIC PANEL
BUN: 75 mg/dL — ABNORMAL HIGH (ref 6–23)
CO2: 36 mEq/L — ABNORMAL HIGH (ref 19–32)
Calcium: 10 mg/dL (ref 8.4–10.5)
Chloride: 91 mEq/L — ABNORMAL LOW (ref 96–112)
Creatinine, Ser: 2.1 mg/dL — ABNORMAL HIGH (ref 0.50–1.35)
Glucose, Bld: 263 mg/dL — ABNORMAL HIGH (ref 70–99)

## 2013-08-04 LAB — GLUCOSE, CAPILLARY
Glucose-Capillary: 297 mg/dL — ABNORMAL HIGH (ref 70–99)
Glucose-Capillary: 316 mg/dL — ABNORMAL HIGH (ref 70–99)

## 2013-08-04 MED ORDER — CLOTRIMAZOLE 1 % EX CREA
TOPICAL_CREAM | Freq: Two times a day (BID) | CUTANEOUS | Status: DC
  Administered 2013-08-04 – 2013-08-09 (×11): via TOPICAL
  Administered 2013-08-10: 1 via TOPICAL
  Administered 2013-08-10 – 2013-08-13 (×5): via TOPICAL
  Filled 2013-08-04: qty 15

## 2013-08-04 MED ORDER — MIDAZOLAM HCL 2 MG/2ML IJ SOLN
INTRAMUSCULAR | Status: DC | PRN
  Administered 2013-08-04: 1 mg via INTRAVENOUS

## 2013-08-04 MED ORDER — FENTANYL CITRATE 0.05 MG/ML IJ SOLN
INTRAMUSCULAR | Status: DC | PRN
  Administered 2013-08-04: 25 ug via INTRAVENOUS

## 2013-08-04 MED ORDER — ADULT MULTIVITAMIN W/MINERALS CH
1.0000 | ORAL_TABLET | Freq: Every day | ORAL | Status: DC
  Administered 2013-08-04 – 2013-08-11 (×8): 1 via ORAL
  Filled 2013-08-04 (×8): qty 1

## 2013-08-04 MED ORDER — PRO-STAT SUGAR FREE PO LIQD
30.0000 mL | Freq: Two times a day (BID) | ORAL | Status: DC
  Administered 2013-08-04 – 2013-08-11 (×15): 30 mL via ORAL
  Filled 2013-08-04 (×7): qty 30
  Filled 2013-08-04: qty 60
  Filled 2013-08-04 (×7): qty 30

## 2013-08-04 MED ORDER — DEXTROSE 5 % IV SOLN
1.0000 g | INTRAVENOUS | Status: DC
  Administered 2013-08-04 – 2013-08-10 (×7): 1 g via INTRAVENOUS
  Filled 2013-08-04 (×7): qty 1

## 2013-08-04 NOTE — Procedures (Signed)
L pleurx drain No comp

## 2013-08-04 NOTE — Progress Notes (Addendum)
Nutrition Follow-up   INTERVENTION: Add ProStat 30 ml BID  MVI daily   NUTRITION DIAGNOSIS: Inadequate oral intake related to increased protein-energy needs; ongoing  Goal: Pt to meet >/= 90% of their estimated nutrition needs  Monitor:  Diet advancement, Po intake, labs, I/O's and wt trends   77 y.o. male  Admitting Dx: Pneumonia (health-care associated)   ASSESSMENT: Pt out for placement of PleurX catheter due recurrent pleural effusion.  His recent meal intake 50-100%. Currently NPO for his procedure. Will continue to follow his nutrition status.  Height: Ht Readings from Last 1 Encounters:  Aug 18, 2013 5\' 6"  (1.676 m)    Weight: Wt Readings from Last 1 Encounters:  08/04/13 166 lb 12.8 oz (75.66 kg)  Admit wt. 152# on Aug 18, 2013 -increase likely fluid related  Ideal Body Weight: 136# (61.8 kg)  % Ideal Body Weight: 113%  Wt Readings from Last 10 Encounters:  08/04/13 166 lb 12.8 oz (75.66 kg)  07/02/13 156 lb 12 oz (71.1 kg)  06/22/13 182 lb 12.8 oz (82.918 kg)  06/22/13 182 lb 12.8 oz (82.918 kg)  05/26/13 163 lb 5.8 oz (74.1 kg)  04/26/13 180 lb 1.9 oz (81.7 kg)  10/27/12 180 lb (81.647 kg)  10/27/12 180 lb (81.647 kg)  09/29/12 180 lb (81.647 kg)  04/01/12 185 lb (83.915 kg)    Estimated Nutritional Needs: Kcal: 1750-2100 Protein: 77-85 gr Fluid: per MD goals   Skin: No issues noted  Diet Order: NPO-  EDUCATION NEEDS: -Education needs addressed   Intake/Output Summary (Last 24 hours) at 08/04/13 1333 Last data filed at 08/04/13 0700  Gross per 24 hour  Intake  899.5 ml  Output    775 ml  Net  124.5 ml  Net since admission-August 18, 2013- +1.88 liters  Last BM:  08/02/13   Labs:   Recent Labs Lab 08/01/13 0635 08/02/13 1126 08/03/13 0457 08/04/13 0517  NA 137  --  134* 136  K 3.4*  --  4.4 3.9  CL 90*  --  89* 91*  CO2 40*  --  36* 36*  BUN 34*  --  66* 75*  CREATININE 1.54*  --  2.04* 2.10*  CALCIUM 9.0  --  9.8 10.0  MG  --  2.9*  --    --   GLUCOSE 168*  --  323* 263*    CBG (last 3)   Recent Labs  08/03/13 1712 08/03/13 2034 08/04/13 0758  GLUCAP 329* 295* 297*    Scheduled Meds: . albuterol  2.5 mg Nebulization Q4H  . aspirin  81 mg Oral Daily  . ceFEPime (MAXIPIME) IV  1 g Intravenous Q24H  . clotrimazole   Topical BID  . diltiazem  240 mg Oral BID  . ferrous fumarate  1 tablet Oral BID  . fluticasone  1 puff Inhalation BID  . furosemide  40 mg Intravenous Q12H  . guaiFENesin  600 mg Oral BID  . insulin aspart  0-15 Units Subcutaneous TID WC  . insulin aspart  0-5 Units Subcutaneous QHS  . insulin aspart  3 Units Subcutaneous TID WC  . insulin glargine  5 Units Subcutaneous QHS  . ipratropium  0.5 mg Nebulization Q4H  . methylPREDNISolone (SOLU-MEDROL) injection  125 mg Intravenous Q6H  . pantoprazole  40 mg Oral BID AC  . sertraline  50 mg Oral Daily  . sodium chloride  3 mL Intravenous Q12H  . sodium chloride  3 mL Intravenous Q12H  . vancomycin  750 mg Intravenous Q24H  Continuous Infusions:   Past Medical History  Diagnosis Date  . COPD (chronic obstructive pulmonary disease)   . AF (atrial fibrillation)   . Hypertension   . Gout   . Arthritis   . Small bowel obstruction   . Renal insufficiency   . Aortic stenosis   . Hydropneumothorax   . Diabetes mellitus   . CHF (congestive heart failure)   . On home O2     qhs prn  . Pneumonia 05/04/2013    history of pneumonia  . Pneumonia 10/12  . Hydropneumothorax 2012    Past Surgical History  Procedure Laterality Date  . Rotator cuff repair      left and right  . Cardiac valve replacement      aortic valve with a tissue prosthesis and left sided maze proc  . S/p rewiring of sternum for dehiscence    . Gallbladder surgery    . US echocardiography  01-03-11    EF 55-60%  . Cardiovascular stress test  01-26-09    EF 67%  . Coronary angioplasty    . Cholecystectomy    . Sternal incision reclosure  08/29/2010    sternal rewiring   . Cataract extraction w/phaco  10/02/2012    Procedure: CATARACT EXTRACTION PHACO AND INTRAOCULAR LENS PLACEMENT (IOC);  Surgeon: Gemma Payor, MD;  Location: [facility];  Service: Ophthalmology;  Laterality: Right;  CDE 16.68  . Cataract extraction w/phaco  10/27/2012    Procedure: CATARACT EXTRACTION PHACO AND INTRAOCULAR LENS PLACEMENT (IOC);  Surgeon: Gemma Payor, MD;  Location: [facility];  Service: Ophthalmology;  Laterality: Left;  CDE:16.89  . Artificial aortic valve    . Colonoscopy N/A 06/19/2013    Dr. Jennell Corner internal hemorrhoids, diverticulosis  . Esophagogastroduodenoscopy N/A 06/19/2013    Dr. Yong Channel stricture passed by scope, multiple small gastric ulcers with evidence of old blood in the stomach, gastritis. Biopsy was negative for H. pylori. There was Candida species and possible colonization of the ulcer.    Royann Shivers MS,RD,LDN,CSG Office: [phone] Pager: [phone]

## 2013-08-04 NOTE — Progress Notes (Signed)
Subjective: He says he feels significantly better today. He has no other new complaints. His breathing is better but he is still short of breath.  Objective: Vital signs in last 24 hours: Temp:  [97.4 F (36.3 C)-98.3 F (36.8 C)] 97.7 F (36.5 C) (08/05 0541) Pulse Rate:  [84-105] 84 (08/05 0541) Resp:  [19-22] 20 (08/05 0541) BP: (125-147)/(69-82) 128/82 mmHg (08/05 0541) SpO2:  [89 %-96 %] 89 % (08/05 0720) FiO2 (%):  [50 %] 50 % (08/05 0720) Weight:  [75.66 kg (166 lb 12.8 oz)] 75.66 kg (166 lb 12.8 oz) (08/05 0308) Weight change: 4.173 kg (9 lb 3.2 oz) Last BM Date: 08/02/13  Intake/Output from previous day: 08/04 0701 - 08/05 0700 In: 1287.5 [P.O.:460; Blood:677.5; IV Piggyback:150] Out: 875 [Urine:875]  PHYSICAL EXAM General appearance: alert, cooperative and mild distress Resp: rhonchi bilaterally Cardio: regular rate and rhythm, S1, S2 normal, no murmur, click, rub or gallop GI: soft, non-tender; bowel sounds normal; no masses,  no organomegaly Extremities: edema Trace to 1+ of his feet  Lab Results:    Basic Metabolic Panel:  Recent Labs  09/81/19 1126 08/03/13 0457 08/04/13 0517  NA  --  134* 136  K  --  4.4 3.9  CL  --  89* 91*  CO2  --  36* 36*  GLUCOSE  --  323* 263*  BUN  --  66* 75*  CREATININE  --  2.04* 2.10*  CALCIUM  --  9.8 10.0  MG 2.9*  --   --    Liver Function Tests: No results found for this basename: AST, ALT, ALKPHOS, BILITOT, PROT, ALBUMIN,  in the last 72 hours No results found for this basename: LIPASE, AMYLASE,  in the last 72 hours No results found for this basename: AMMONIA,  in the last 72 hours CBC:  Recent Labs  08/03/13 0457 08/03/13 2106 08/04/13 0517  WBC 6.3  --  6.2  NEUTROABS  --   --  5.5  HGB 7.2* 10.3* 9.6*  HCT 23.2* 32.5* 30.7*  MCV 86.6  --  86.7  PLT 258  --  245   Cardiac Enzymes: No results found for this basename: CKTOTAL, CKMB, CKMBINDEX, TROPONINI,  in the last 72 hours BNP: No results found  for this basename: PROBNP,  in the last 72 hours D-Dimer: No results found for this basename: DDIMER,  in the last 72 hours CBG:  Recent Labs  08/02/13 2146 08/03/13 0725 08/03/13 1137 08/03/13 1712 08/03/13 2034 08/04/13 0758  GLUCAP 331* 308* 327* 329* 295* 297*   Hemoglobin A1C: No results found for this basename: HGBA1C,  in the last 72 hours Fasting Lipid Panel: No results found for this basename: CHOL, HDL, LDLCALC, TRIG, CHOLHDL, LDLDIRECT,  in the last 72 hours Thyroid Function Tests: No results found for this basename: TSH, T4TOTAL, FREET4, T3FREE, THYROIDAB,  in the last 72 hours Anemia Panel: No results found for this basename: VITAMINB12, FOLATE, FERRITIN, TIBC, IRON, RETICCTPCT,  in the last 72 hours Coagulation:  Recent Labs  08/03/13 0457  LABPROT 15.6*  INR 1.27   Urine Drug Screen: Drugs of Abuse  No results found for this basename: labopia, cocainscrnur, labbenz, amphetmu, thcu, labbarb    Alcohol Level: No results found for this basename: ETH,  in the last 72 hours Urinalysis: No results found for this basename: COLORURINE, APPERANCEUR, LABSPEC, PHURINE, GLUCOSEU, HGBUR, BILIRUBINUR, KETONESUR, PROTEINUR, UROBILINOGEN, NITRITE, LEUKOCYTESUR,  in the last 72 hours Misc. Labs:  ABGS No results found for  this basename: PHART, PCO2, PO2ART, TCO2, HCO3,  in the last 72 hours CULTURES Recent Results (from the past 240 hour(s))  CULTURE, EXPECTORATED SPUTUM-ASSESSMENT     Status: None   Collection Time    07/30/13  6:56 AM      Result Value Range Status   Specimen Description SPUTUM EXPECTORATED   Final   Special Requests NONE   Final   Sputum evaluation     Final   Value: MICROSCOPIC FINDINGS SUGGEST THAT THIS SPECIMEN IS NOT REPRESENTATIVE OF LOWER RESPIRATORY SECRETIONS. PLEASE RECOLLECT.     Results Called to: [facility]. AT 1119 ON 07/30/2013 BY BAUGHAM,M.     Performed at [facility]   Report Status 07/30/2013 FINAL   Final    Studies/Results: Dg Chest Port 1 View  08/03/2013   *RADIOLOGY REPORT*  Clinical Data: Respiratory failure  PORTABLE CHEST - 1 VIEW  Comparison: 08/01/2013  Findings: Severe bilateral airspace disease is unchanged.  Left pleural effusion and left lower compressive atelectasis also unchanged.  IMPRESSION: No significant change severe diffuse bilateral airspace disease and left effusion.   Original Report Authenticated By: Janeece Riggers, M.D.    Medications:  Prior to Admission:  Prescriptions prior to admission  Medication Sig Dispense Refill  . aspirin 81 MG chewable tablet Chew 81 mg by mouth daily.      Marland Kitchen diltiazem (CARDIZEM CD) 240 MG 24 hr capsule Take 1 capsule (240 mg total) by mouth 2 (two) times daily.  60 capsule  12  . fluticasone (FLOVENT HFA) 110 MCG/ACT inhaler Inhale 1 puff into the lungs 2 (two) times daily.      . furosemide (LASIX) 40 MG tablet Take 2 tablets (80 mg total) by mouth 2 (two) times daily.  30 tablet  12  . glyBURIDE (DIABETA) 5 MG tablet Take 5 mg by mouth daily with breakfast.      . insulin aspart (NOVOLOG) 100 UNIT/ML injection Inject 3-15 Units into the skin 3 (three) times daily with meals. Sliding scale as follows: 121-150=3 units 151-200=4 units 201-250=7 units 251-300=9 units 301-350=12 units 351-400=15 units      . insulin glargine (LANTUS) 100 UNIT/ML injection Inject 5 Units into the skin at bedtime.      Marland Kitchen ipratropium-albuterol (DUONEB) 0.5-2.5 (3) MG/3ML SOLN Take 3 mLs by nebulization 4 (four) times daily.      . magnesium hydroxide (MILK OF MAGNESIA) 400 MG/5ML suspension Take 30 mLs by mouth daily as needed for constipation.      . pantoprazole (PROTONIX) 40 MG tablet Take 1 tablet (40 mg total) by mouth 2 (two) times daily before a meal.  60 tablet  12  . predniSONE (DELTASONE) 10 MG tablet Take 5 mg by mouth daily with breakfast.      . sodium chloride (OCEAN) 0.65 % SOLN nasal spray Place 1 spray into the nose as needed for congestion.    0   . acetaminophen (TYLENOL) 500 MG tablet Take 1,000 mg by mouth every 6 (six) hours as needed. Pain       Scheduled: . albuterol  2.5 mg Nebulization Q4H  . aspirin  81 mg Oral Daily  . ceFEPime (MAXIPIME) IV  2 g Intravenous Q24H  . clotrimazole   Topical BID  . diltiazem  240 mg Oral BID  . ferrous fumarate  1 tablet Oral BID  . fluticasone  1 puff Inhalation BID  . furosemide  40 mg Intravenous Q12H  . guaiFENesin  600 mg Oral BID  .  insulin aspart  0-15 Units Subcutaneous TID WC  . insulin aspart  0-5 Units Subcutaneous QHS  . insulin aspart  3 Units Subcutaneous TID WC  . insulin glargine  5 Units Subcutaneous QHS  . ipratropium  0.5 mg Nebulization Q4H  . methylPREDNISolone (SOLU-MEDROL) injection  125 mg Intravenous Q6H  . pantoprazole  40 mg Oral BID AC  . sertraline  50 mg Oral Daily  . sodium chloride  3 mL Intravenous Q12H  . sodium chloride  3 mL Intravenous Q12H  . vancomycin  750 mg Intravenous Q24H   Continuous:  NWG:NFAOZH chloride, acetaminophen, acetaminophen, albuterol, bisacodyl, ondansetron (ZOFRAN) IV, ondansetron, sodium chloride, sodium chloride  Assesment: He was admitted with acute on chronic respiratory failure from healthcare associated pneumonia and a left pleural effusion. His pleural effusion is recurrent and he is being set up for pleural catheter placement. I think he's okay to do that today. He was significantly anemic yesterday and received 2 units of packed red blood cells and feels better. I don't think his respiratory status will improve until he has the fluid removed. Active Problems:   Atrial flutter   COPD (chronic obstructive pulmonary disease)   Hypertension   CKD (chronic kidney disease) stage 3, GFR 30-59 ml/min   Type 2 diabetes mellitus, uncontrolled   Chronic diastolic congestive heart failure   Acute-on-chronic respiratory failure   HCAP (healthcare-associated pneumonia)   Anemia, normocytic normochromic   PUD (peptic ulcer  disease)    Plan: For pleural catheter today    LOS: 6 days   Avri Paiva L 08/04/2013, 8:45 AM

## 2013-08-04 NOTE — H&P (Signed)
[name] [name] is an 77 y.o. male.   Chief Complaint: Recurrent L pleural effusion Pneumonia; COPD Recurrent thoracentesis: 7/25; 6/30; 6/18- all over 1 liter- non malignant Scheduled now for left chest PleurX catheter placement Home Health nursing HPI: COPD; PNA; HTN; A fib; SBO; Ao Stenosis; DM; CHF; Hydroptx; CKD  Past Medical History  Diagnosis Date  . COPD (chronic obstructive pulmonary disease)   . AF (atrial fibrillation)   . Hypertension   . Gout   . Arthritis   . Small bowel obstruction   . Renal insufficiency   . Aortic stenosis   . Hydropneumothorax   . Diabetes mellitus   . CHF (congestive heart failure)   . On home O2     qhs prn  . Pneumonia 05/04/2013    history of pneumonia  . Pneumonia 10/12  . Hydropneumothorax 2012    Past Surgical History  Procedure Laterality Date  . Rotator cuff repair      left and right  . Cardiac valve replacement      aortic valve with a tissue prosthesis and left sided maze proc  . S/p rewiring of sternum for dehiscence    . Gallbladder surgery    . US echocardiography  01-03-11    EF 55-60%  . Cardiovascular stress test  01-26-09    EF 67%  . Coronary angioplasty    . Cholecystectomy    . Sternal incision reclosure  08/29/2010    sternal rewiring  . Cataract extraction w/phaco  10/02/2012    Procedure: CATARACT EXTRACTION PHACO AND INTRAOCULAR LENS PLACEMENT (IOC);  Surgeon: Gemma Payor, MD;  Location: [facility];  Service: Ophthalmology;  Laterality: Right;  CDE 16.68  . Cataract extraction w/phaco  10/27/2012    Procedure: CATARACT EXTRACTION PHACO AND INTRAOCULAR LENS PLACEMENT (IOC);  Surgeon: Gemma Payor, MD;  Location: [facility];  Service: Ophthalmology;  Laterality: Left;  CDE:16.89  . Artificial aortic valve    . Colonoscopy N/A 06/19/2013    Dr. Jennell Corner internal hemorrhoids, diverticulosis  . Esophagogastroduodenoscopy N/A 06/19/2013    Dr. Yong Channel stricture passed by scope, multiple small gastric ulcers  with evidence of old blood in the stomach, gastritis. Biopsy was negative for H. pylori. There was Candida species and possible colonization of the ulcer.    Family History  Problem Relation Age of Onset  . Hypertension Mother   . Stroke Mother   . Heart disease Sister   . Kidney disease Sister    Social History:  reports that he quit smoking about 8 years ago. His smoking use included Cigarettes. He smoked 0.00 packs per day. He has quit using smokeless tobacco. He reports that he does not drink alcohol or use illicit drugs.  Allergies: No Known Allergies  Medications Prior to Admission  Medication Sig Dispense Refill  . aspirin 81 MG chewable tablet Chew 81 mg by mouth daily.      Marland Kitchen diltiazem (CARDIZEM CD) 240 MG 24 hr capsule Take 1 capsule (240 mg total) by mouth 2 (two) times daily.  60 capsule  12  . fluticasone (FLOVENT HFA) 110 MCG/ACT inhaler Inhale 1 puff into the lungs 2 (two) times daily.      . furosemide (LASIX) 40 MG tablet Take 2 tablets (80 mg total) by mouth 2 (two) times daily.  30 tablet  12  . glyBURIDE (DIABETA) 5 MG tablet Take 5 mg by mouth daily with breakfast.      . insulin aspart (NOVOLOG) 100 UNIT/ML injection Inject 3-15  Units into the skin 3 (three) times daily with meals. Sliding scale as follows: 121-150=3 units 151-200=4 units 201-250=7 units 251-300=9 units 301-350=12 units 351-400=15 units      . insulin glargine (LANTUS) 100 UNIT/ML injection Inject 5 Units into the skin at bedtime.      Marland Kitchen ipratropium-albuterol (DUONEB) 0.5-2.5 (3) MG/3ML SOLN Take 3 mLs by nebulization 4 (four) times daily.      . magnesium hydroxide (MILK OF MAGNESIA) 400 MG/5ML suspension Take 30 mLs by mouth daily as needed for constipation.      . pantoprazole (PROTONIX) 40 MG tablet Take 1 tablet (40 mg total) by mouth 2 (two) times daily before a meal.  60 tablet  12  . predniSONE (DELTASONE) 10 MG tablet Take 5 mg by mouth daily with breakfast.      . sodium chloride  (OCEAN) 0.65 % SOLN nasal spray Place 1 spray into the nose as needed for congestion.    0  . acetaminophen (TYLENOL) 500 MG tablet Take 1,000 mg by mouth every 6 (six) hours as needed. Pain        Results for orders placed during the hospital encounter of 06/30/2013 (from the past 48 hour(s))  GLUCOSE, CAPILLARY     Status: Abnormal   Collection Time    08/02/13  4:36 PM      Result Value Range   Glucose-Capillary 232 (*) 70 - 99 mg/dL   Comment 1 Notify RN     Comment 2 Documented in Chart    OCCULT BLOOD X 1 CARD TO LAB, STOOL     Status: None   Collection Time    08/02/13  4:50 PM      Result Value Range   Fecal Occult Bld NEGATIVE  NEGATIVE  GLUCOSE, CAPILLARY     Status: Abnormal   Collection Time    08/02/13  9:46 PM      Result Value Range   Glucose-Capillary 331 (*) 70 - 99 mg/dL  BASIC METABOLIC PANEL     Status: Abnormal   Collection Time    08/03/13  4:57 AM      Result Value Range   Sodium 134 (*) 135 - 145 mEq/L   Potassium 4.4  3.5 - 5.1 mEq/L   Comment: DELTA CHECK NOTED   Chloride 89 (*) 96 - 112 mEq/L   CO2 36 (*) 19 - 32 mEq/L   Glucose, Bld 323 (*) 70 - 99 mg/dL   BUN 66 (*) 6 - 23 mg/dL   Creatinine, Ser 4.69 (*) 0.50 - 1.35 mg/dL   Calcium 9.8  8.4 - 62.9 mg/dL   GFR calc non Af Amer 30 (*) >90 mL/min   GFR calc Af Amer 35 (*) >90 mL/min   Comment:            The eGFR has been calculated     using the CKD EPI equation.     This calculation has not been     validated in all clinical     situations.     eGFR's persistently     <90 mL/min signify     possible Chronic Kidney Disease.  CBC     Status: Abnormal   Collection Time    08/03/13  4:57 AM      Result Value Range   WBC 6.3  4.0 - 10.5 K/uL   RBC 2.68 (*) 4.22 - 5.81 MIL/uL   Hemoglobin 7.2 (*) 13.0 - 17.0 g/dL   HCT 52.8 (*)  39.0 - 52.0 %   MCV 86.6  78.0 - 100.0 fL   MCH 26.9  26.0 - 34.0 pg   MCHC 31.0  30.0 - 36.0 g/dL   RDW 16.1 (*) 09.6 - 04.5 %   Platelets 258  150 - 400 K/uL   PROTIME-INR     Status: Abnormal   Collection Time    08/03/13  4:57 AM      Result Value Range   Prothrombin Time 15.6 (*) 11.6 - 15.2 seconds   INR 1.27  0.00 - 1.49  APTT     Status: None   Collection Time    08/03/13  4:57 AM      Result Value Range   aPTT 32  24 - 37 seconds  GLUCOSE, CAPILLARY     Status: Abnormal   Collection Time    08/03/13  7:25 AM      Result Value Range   Glucose-Capillary 308 (*) 70 - 99 mg/dL   Comment 1 Notify RN    TYPE AND SCREEN     Status: None   Collection Time    08/03/13  8:42 AM      Result Value Range   ABO/RH(D) O NEG     Antibody Screen NEG     Sample Expiration 08/06/2013     Unit Number [id number]     Blood Component Type RED CELLS,LR     Unit division 00     Status of Unit ISSUED,FINAL     Transfusion Status OK TO TRANSFUSE     Crossmatch Result Compatible     Unit Number [id number]     Blood Component Type RED CELLS,LR     Unit division 00     Status of Unit ISSUED,FINAL     Transfusion Status OK TO TRANSFUSE     Crossmatch Result Compatible    PREPARE RBC (CROSSMATCH)     Status: None   Collection Time    08/03/13  8:47 AM      Result Value Range   Order Confirmation ORDER PROCESSED BY BLOOD BANK    GLUCOSE, CAPILLARY     Status: Abnormal   Collection Time    08/03/13 11:37 AM      Result Value Range   Glucose-Capillary 327 (*) 70 - 99 mg/dL   Comment 1 Notify RN     Comment 2 Documented in Chart    GLUCOSE, CAPILLARY     Status: Abnormal   Collection Time    08/03/13  5:12 PM      Result Value Range   Glucose-Capillary 329 (*) 70 - 99 mg/dL   Comment 1 Notify RN     Comment 2 Documented in Chart    GLUCOSE, CAPILLARY     Status: Abnormal   Collection Time    08/03/13  8:34 PM      Result Value Range   Glucose-Capillary 295 (*) 70 - 99 mg/dL   Comment 1 Notify RN    HEMOGLOBIN AND HEMATOCRIT, BLOOD     Status: Abnormal   Collection Time    08/03/13  9:06 PM      Result Value Range   Hemoglobin  10.3 (*) 13.0 - 17.0 g/dL   Comment: DELTA CHECK NOTED   HCT 32.5 (*) 39.0 - 52.0 %  BASIC METABOLIC PANEL     Status: Abnormal   Collection Time    08/04/13  5:17 AM      Result Value Range   Sodium 136  135 - 145 mEq/L   Potassium 3.9  3.5 - 5.1 mEq/L   Chloride 91 (*) 96 - 112 mEq/L   CO2 36 (*) 19 - 32 mEq/L   Glucose, Bld 263 (*) 70 - 99 mg/dL   BUN 75 (*) 6 - 23 mg/dL   Creatinine, Ser 1.61 (*) 0.50 - 1.35 mg/dL   Calcium 09.6  8.4 - 04.5 mg/dL   GFR calc non Af Amer 29 (*) >90 mL/min   GFR calc Af Amer 34 (*) >90 mL/min   Comment:            The eGFR has been calculated     using the CKD EPI equation.     This calculation has not been     validated in all clinical     situations.     eGFR's persistently     <90 mL/min signify     possible Chronic Kidney Disease.  CBC WITH DIFFERENTIAL     Status: Abnormal   Collection Time    08/04/13  5:17 AM      Result Value Range   WBC 6.2  4.0 - 10.5 K/uL   RBC 3.54 (*) 4.22 - 5.81 MIL/uL   Hemoglobin 9.6 (*) 13.0 - 17.0 g/dL   HCT 40.9 (*) 81.1 - 91.4 %   MCV 86.7  78.0 - 100.0 fL   MCH 27.1  26.0 - 34.0 pg   MCHC 31.3  30.0 - 36.0 g/dL   RDW 78.2 (*) 95.6 - 21.3 %   Platelets 245  150 - 400 K/uL   Neutrophils Relative % 88 (*) 43 - 77 %   Neutro Abs 5.5  1.7 - 7.7 K/uL   Lymphocytes Relative 4 (*) 12 - 46 %   Lymphs Abs 0.3 (*) 0.7 - 4.0 K/uL   Monocytes Relative 8  3 - 12 %   Monocytes Absolute 0.5  0.1 - 1.0 K/uL   Eosinophils Relative 0  0 - 5 %   Eosinophils Absolute 0.0  0.0 - 0.7 K/uL   Basophils Relative 0  0 - 1 %   Basophils Absolute 0.0  0.0 - 0.1 K/uL  GLUCOSE, CAPILLARY     Status: Abnormal   Collection Time    08/04/13  7:58 AM      Result Value Range   Glucose-Capillary 297 (*) 70 - 99 mg/dL   Comment 1 Notify RN     Dg Chest Port 1 View  08/03/2013   *RADIOLOGY REPORT*  Clinical Data: Respiratory failure  PORTABLE CHEST - 1 VIEW  Comparison: 08/01/2013  Findings: Severe bilateral airspace disease is  unchanged.  Left pleural effusion and left lower compressive atelectasis also unchanged.  IMPRESSION: No significant change severe diffuse bilateral airspace disease and left effusion.   Original Report Authenticated By: Janeece Riggers, M.D.    Review of Systems  Constitutional: Positive for weight loss. Negative for fever.  Respiratory: Positive for cough, sputum production and shortness of breath.   Cardiovascular: Positive for chest pain.  Gastrointestinal: Negative for nausea, vomiting and abdominal pain.  Neurological: Positive for weakness.    Blood pressure 119/58, pulse 92, temperature 97.7 F (36.5 C), temperature source Oral, resp. rate 20, height 5\' 6"  (1.676 m), weight 166 lb 12.8 oz (75.66 kg), SpO2 92.00%. Physical Exam  Constitutional: He is oriented to person, place, and time.  Frail, weak  Cardiovascular: Normal rate and regular rhythm.   Murmur heard. Respiratory: He is in respiratory distress. He has  wheezes.  GI: Soft. Bowel sounds are normal. There is no tenderness.  Musculoskeletal: Normal range of motion.  weak  Neurological: He is alert and oriented to person, place, and time.  Psychiatric: He has a normal mood and affect. His behavior is normal. Judgment and thought content normal.     Assessment/Plan Recurrent non malig L pleural effusion Scheduled now for L PleurX catheter placemetn Pt aware of procedure benefits and risks and agreeable to proceed Consent signed and in chart Pt with home health nursing  [facility] A 08/04/2013, 12:31 PM

## 2013-08-04 NOTE — Progress Notes (Signed)
ANTIBIOTIC CONSULT NOTE  Pharmacy Consult for Vancomycin & Cefepime Indication: pneumonia  No Known Allergies  Patient Measurements: Height: 5\' 6"  (167.6 cm) Weight: 166 lb 12.8 oz (75.66 kg) IBW/kg (Calculated) : 63.8  Vital Signs: Temp: 97.7 F (36.5 C) (08/05 0541) Temp src: Oral (08/05 0541) BP: 128/82 mmHg (08/05 0541) Pulse Rate: 84 (08/05 0541) Intake/Output from previous day: 08/04 0701 - 08/05 0700 In: 1287.5 [P.O.:460; Blood:677.5; IV Piggyback:150] Out: 875 [Urine:875] Intake/Output from this shift:    Labs:  Recent Labs  08/03/13 0457 08/03/13 2106 08/04/13 0517  WBC 6.3  --  6.2  HGB 7.2* 10.3* 9.6*  PLT 258  --  245  CREATININE 2.04*  --  2.10*   Estimated Creatinine Clearance: 27.4 ml/min (by C-G formula based on Cr of 2.1).  Recent Labs  08/01/13 1040  VANCOTROUGH 19.0    Microbiology: Recent Results (from the past 720 hour(s))  CULTURE, EXPECTORATED SPUTUM-ASSESSMENT     Status: None   Collection Time    07/30/13  6:56 AM      Result Value Range Status   Specimen Description SPUTUM EXPECTORATED   Final   Special Requests NONE   Final   Sputum evaluation     Final   Value: MICROSCOPIC FINDINGS SUGGEST THAT THIS SPECIMEN IS NOT REPRESENTATIVE OF LOWER RESPIRATORY SECRETIONS. PLEASE RECOLLECT.     Results Called to: [facility]. AT 1119 ON 07/30/2013 BY BAUGHAM,M.     Performed at [facility]   Report Status 07/30/2013 FINAL   Final   Medical History: Past Medical History  Diagnosis Date  . COPD (chronic obstructive pulmonary disease)   . AF (atrial fibrillation)   . Hypertension   . Gout   . Arthritis   . Small bowel obstruction   . Renal insufficiency   . Aortic stenosis   . Hydropneumothorax   . Diabetes mellitus   . CHF (congestive heart failure)   . On home O2     qhs prn  . Pneumonia 05/04/2013    history of pneumonia  . Pneumonia 10/12  . Hydropneumothorax 2012   Medications:  Scheduled:  . albuterol  2.5 mg  Nebulization Q4H  . aspirin  81 mg Oral Daily  . ceFEPime (MAXIPIME) IV  1 g Intravenous Q24H  . clotrimazole   Topical BID  . diltiazem  240 mg Oral BID  . ferrous fumarate  1 tablet Oral BID  . fluticasone  1 puff Inhalation BID  . furosemide  40 mg Intravenous Q12H  . guaiFENesin  600 mg Oral BID  . insulin aspart  0-15 Units Subcutaneous TID WC  . insulin aspart  0-5 Units Subcutaneous QHS  . insulin aspart  3 Units Subcutaneous TID WC  . insulin glargine  5 Units Subcutaneous QHS  . ipratropium  0.5 mg Nebulization Q4H  . methylPREDNISolone (SOLU-MEDROL) injection  125 mg Intravenous Q6H  . pantoprazole  40 mg Oral BID AC  . sertraline  50 mg Oral Daily  . sodium chloride  3 mL Intravenous Q12H  . sodium chloride  3 mL Intravenous Q12H  . vancomycin  750 mg Intravenous Q24H   Assessment: 77 yo M who was recently hospitalized presents with developing PNA.  He is on broad-spectrum antibiotics.    Estimated Creatinine Clearance: 27.4 ml/min (by C-G formula based on Cr of 2.1). Trough at goal but on high end. No micro data.  SCr is worse today.  Zosyn 7/30 x 1 Cefepime 7/30 >> Vancomycin 7/30 >>  Goal of Therapy:  Vancomycin trough level 15-20 mcg/ml  Plan:  1) Adjust Cefepime to 1gm IV q24h (renally adjusted) 2) Adjust Vancomycin to 750mg  IV q24hrs to discourage elevated trough levels. 3) Weekly Vancomycin trough if continues. 4) Monitor renal function and cx data per guidelines  Valrie Hart A 08/04/2013,10:19 AM

## 2013-08-04 NOTE — Progress Notes (Signed)
PT Cancellation Note  Patient Details Name: [name] [name] MRN: [id number] DOB: 29-Jul-1936   Cancelled Treatment:    Reason Eval/Treat Not Completed: Patient at procedure or test/unavailable   Myrlene Broker L 08/04/2013, 2:38 PM

## 2013-08-05 ENCOUNTER — Ambulatory Visit: Payer: Medicare Other | Admitting: Internal Medicine

## 2013-08-05 LAB — GLUCOSE, CAPILLARY
Glucose-Capillary: 376 mg/dL — ABNORMAL HIGH (ref 70–99)
Glucose-Capillary: 445 mg/dL — ABNORMAL HIGH (ref 70–99)
Glucose-Capillary: 320 mg/dL — ABNORMAL HIGH (ref 70–99)

## 2013-08-05 LAB — BASIC METABOLIC PANEL
BUN: 88 mg/dL — ABNORMAL HIGH (ref 6–23)
CO2: 34 mEq/L — ABNORMAL HIGH (ref 19–32)
Calcium: 9.9 mg/dL (ref 8.4–10.5)
Chloride: 90 mEq/L — ABNORMAL LOW (ref 96–112)
Creatinine, Ser: 2.24 mg/dL — ABNORMAL HIGH (ref 0.50–1.35)
GFR calc Af Amer: 31 mL/min — ABNORMAL LOW (ref >90)

## 2013-08-05 MED ORDER — INSULIN GLARGINE 100 UNIT/ML ~~LOC~~ SOLN
15.0000 [IU] | Freq: Every day | SUBCUTANEOUS | Status: DC
  Administered 2013-08-05: 15 [IU] via SUBCUTANEOUS
  Filled 2013-08-05 (×2): qty 0.15

## 2013-08-05 MED ORDER — INSULIN ASPART 100 UNIT/ML ~~LOC~~ SOLN
4.0000 [IU] | Freq: Once | SUBCUTANEOUS | Status: AC
  Administered 2013-08-05: 4 [IU] via SUBCUTANEOUS

## 2013-08-05 MED ORDER — FUROSEMIDE 10 MG/ML IJ SOLN
40.0000 mg | Freq: Two times a day (BID) | INTRAMUSCULAR | Status: DC
  Administered 2013-08-05 – 2013-08-06 (×3): 40 mg via INTRAVENOUS
  Filled 2013-08-05 (×3): qty 4

## 2013-08-05 MED ORDER — INSULIN ASPART 100 UNIT/ML ~~LOC~~ SOLN
8.0000 [IU] | Freq: Three times a day (TID) | SUBCUTANEOUS | Status: DC
  Administered 2013-08-05 – 2013-08-11 (×19): 8 [IU] via SUBCUTANEOUS

## 2013-08-05 NOTE — Progress Notes (Signed)
Patient with CBG 445. Dr. [name] notified. New orders to add an extra 4 units of Novolog now. Change Novolog to 8 units with meals.

## 2013-08-05 NOTE — Progress Notes (Signed)
Subjective: He says he feels a little bit better. His blood sugar has been up and he has not been out of bed at about 48 hours except the bathroom. Pleura cath is now drained about 1300 cc. He is still requiring oxygen by mask  Objective: Vital signs in last 24 hours: Temp:  [97.2 F (36.2 C)-97.9 F (36.6 C)] 97.5 F (36.4 C) (08/06 0424) Pulse Rate:  [88-97] 90 (08/06 0424) Resp:  [16-24] 20 (08/06 0424) BP: (114-137)/(58-90) 118/72 mmHg (08/06 0424) SpO2:  [86 %-100 %] 92 % (08/06 0724) FiO2 (%):  [50 %-100 %] 50 % (08/06 0724) Weight:  [72.9 kg (160 lb 11.5 oz)] 72.9 kg (160 lb 11.5 oz) (08/06 0500) Weight change: -2.76 kg (-6 lb 1.4 oz) Last BM Date: 08/02/13  Intake/Output from previous day: 08/05 0701 - 08/06 0700 In: 440 [P.O.:240; IV Piggyback:200] Out: 1225 [Urine:425; Chest Tube:800]  PHYSICAL EXAM General appearance: alert, cooperative and mild distress Resp: rhonchi bilaterally Cardio: regular rate and rhythm, S1, S2 normal, no murmur, click, rub or gallop GI: soft, non-tender; bowel sounds normal; no masses,  no organomegaly Extremities: He still has edema of his feet  Lab Results:    Basic Metabolic Panel:  Recent Labs  16/10/96 1126  08/04/13 0517 08/05/13 0515  NA  --   < > 136 136  K  --   < > 3.9 3.8  CL  --   < > 91* 90*  CO2  --   < > 36* 34*  GLUCOSE  --   < > 263* 359*  BUN  --   < > 75* 88*  CREATININE  --   < > 2.10* 2.24*  CALCIUM  --   < > 10.0 9.9  MG 2.9*  --   --   --   < > = values in this interval not displayed. Liver Function Tests: No results found for this basename: AST, ALT, ALKPHOS, BILITOT, PROT, ALBUMIN,  in the last 72 hours No results found for this basename: LIPASE, AMYLASE,  in the last 72 hours No results found for this basename: AMMONIA,  in the last 72 hours CBC:  Recent Labs  08/03/13 0457 08/03/13 2106 08/04/13 0517  WBC 6.3  --  6.2  NEUTROABS  --   --  5.5  HGB 7.2* 10.3* 9.6*  HCT 23.2* 32.5* 30.7*   MCV 86.6  --  86.7  PLT 258  --  245   Cardiac Enzymes: No results found for this basename: CKTOTAL, CKMB, CKMBINDEX, TROPONINI,  in the last 72 hours BNP: No results found for this basename: PROBNP,  in the last 72 hours D-Dimer: No results found for this basename: DDIMER,  in the last 72 hours CBG:  Recent Labs  08/03/13 1712 08/03/13 2034 08/04/13 0758 08/04/13 1633 08/04/13 2133 08/05/13 0718  GLUCAP 329* 295* 297* 273* 316* 376*   Hemoglobin A1C: No results found for this basename: HGBA1C,  in the last 72 hours Fasting Lipid Panel: No results found for this basename: CHOL, HDL, LDLCALC, TRIG, CHOLHDL, LDLDIRECT,  in the last 72 hours Thyroid Function Tests: No results found for this basename: TSH, T4TOTAL, FREET4, T3FREE, THYROIDAB,  in the last 72 hours Anemia Panel: No results found for this basename: VITAMINB12, FOLATE, FERRITIN, TIBC, IRON, RETICCTPCT,  in the last 72 hours Coagulation:  Recent Labs  08/03/13 0457  LABPROT 15.6*  INR 1.27   Urine Drug Screen: Drugs of Abuse  No results found for this  basename: labopia, cocainscrnur, labbenz, amphetmu, thcu, labbarb    Alcohol Level: No results found for this basename: ETH,  in the last 72 hours Urinalysis: No results found for this basename: COLORURINE, APPERANCEUR, LABSPEC, PHURINE, GLUCOSEU, HGBUR, BILIRUBINUR, KETONESUR, PROTEINUR, UROBILINOGEN, NITRITE, LEUKOCYTESUR,  in the last 72 hours Misc. Labs:  ABGS No results found for this basename: PHART, PCO2, PO2ART, TCO2, HCO3,  in the last 72 hours CULTURES Recent Results (from the past 240 hour(s))  CULTURE, EXPECTORATED SPUTUM-ASSESSMENT     Status: None   Collection Time    07/30/13  6:56 AM      Result Value Range Status   Specimen Description SPUTUM EXPECTORATED   Final   Special Requests NONE   Final   Sputum evaluation     Final   Value: MICROSCOPIC FINDINGS SUGGEST THAT THIS SPECIMEN IS NOT REPRESENTATIVE OF LOWER RESPIRATORY SECRETIONS.  PLEASE RECOLLECT.     Results Called to: [facility]. AT 1119 ON 07/30/2013 BY BAUGHAM,M.     Performed at [facility]   Report Status 07/30/2013 FINAL   Final   Studies/Results: Ir US Guide Bx Asp/drain  08/04/2013   *RADIOLOGY REPORT*  Clinical Data/Indication: RECURRENT LEFT PLEURAL EFFUSION  RADIOLOGY EXAMINATION,IR ULTRASOUND GUIDANCE TISSUE ABLATION  Sedation: Versed 1.0 mg, Fentanyl 25 mg.  Total Moderate Sedation Time: 15 minutes.  Vancomycin was given within two hours of incision.  Vancomycin was given due to an antibiotic allergy.  Fluoroscopy Time: 24 seconds.  Procedure: The procedure, risks, benefits, and alternatives were explained to the patient. Questions regarding the procedure were encouraged and answered. The patient understands and consents to the procedure.  The left lower thorax was prepped with betadine in a sterile fashion, and a sterile drape was applied covering the operative field. A sterile gown and sterile gloves were used for the procedure.  Under sonographic guidance, an 18 gauge needle was inserted into the left pleural space containing fluid at the midaxillary line and lower rib cage.  It was removed over an Amplatz.  A peel-away sheath was inserted.  A second incision was made anteriorly 10 cm. The leading edge of the catheter was then advanced from the second incision help the puncture site incision then fed through the peel- away sheath.  The peel-away sheath was removed.  The hub of the catheter was constructed after removing the stylet.  Fluid was removed.  The initial site was closed with a 3-0 Monocryl stitch for the subcutaneous tissues and a 4-0 Vicryl subcuticular stitch. The second site was closed with an O Prolene pursestring knot.  Findings: The image demonstrates placement of a left Pleurx drain.  Complications: None.  IMPRESSION: Successful left Pleurx drain.   Original Report Authenticated By: Jolaine Click, M.D.   Dg Chest Port 1 View  08/03/2013    *RADIOLOGY REPORT*  Clinical Data: Respiratory failure  PORTABLE CHEST - 1 VIEW  Comparison: 08/01/2013  Findings: Severe bilateral airspace disease is unchanged.  Left pleural effusion and left lower compressive atelectasis also unchanged.  IMPRESSION: No significant change severe diffuse bilateral airspace disease and left effusion.   Original Report Authenticated By: Janeece Riggers, M.D.   Ir Perc Pleural Drain W/indwell Cath W/img Guide  08/04/2013   *RADIOLOGY REPORT*  Clinical Data/Indication: RECURRENT LEFT PLEURAL EFFUSION  RADIOLOGY EXAMINATION,IR ULTRASOUND GUIDANCE TISSUE ABLATION  Sedation: Versed 1.0 mg, Fentanyl 25 mg.  Total Moderate Sedation Time: 15 minutes.  Vancomycin was given within two hours of incision.  Vancomycin was given due to an  antibiotic allergy.  Fluoroscopy Time: 24 seconds.  Procedure: The procedure, risks, benefits, and alternatives were explained to the patient. Questions regarding the procedure were encouraged and answered. The patient understands and consents to the procedure.  The left lower thorax was prepped with betadine in a sterile fashion, and a sterile drape was applied covering the operative field. A sterile gown and sterile gloves were used for the procedure.  Under sonographic guidance, an 18 gauge needle was inserted into the left pleural space containing fluid at the midaxillary line and lower rib cage.  It was removed over an Amplatz.  A peel-away sheath was inserted.  A second incision was made anteriorly 10 cm. The leading edge of the catheter was then advanced from the second incision help the puncture site incision then fed through the peel- away sheath.  The peel-away sheath was removed.  The hub of the catheter was constructed after removing the stylet.  Fluid was removed.  The initial site was closed with a 3-0 Monocryl stitch for the subcutaneous tissues and a 4-0 Vicryl subcuticular stitch. The second site was closed with an O Prolene pursestring knot.   Findings: The image demonstrates placement of a left Pleurx drain.  Complications: None.  IMPRESSION: Successful left Pleurx drain.   Original Report Authenticated By: Jolaine Click, M.D.    Medications:  Prior to Admission:  Prescriptions prior to admission  Medication Sig Dispense Refill  . aspirin 81 MG chewable tablet Chew 81 mg by mouth daily.      Marland Kitchen diltiazem (CARDIZEM CD) 240 MG 24 hr capsule Take 1 capsule (240 mg total) by mouth 2 (two) times daily.  60 capsule  12  . fluticasone (FLOVENT HFA) 110 MCG/ACT inhaler Inhale 1 puff into the lungs 2 (two) times daily.      . furosemide (LASIX) 40 MG tablet Take 2 tablets (80 mg total) by mouth 2 (two) times daily.  30 tablet  12  . glyBURIDE (DIABETA) 5 MG tablet Take 5 mg by mouth daily with breakfast.      . insulin aspart (NOVOLOG) 100 UNIT/ML injection Inject 3-15 Units into the skin 3 (three) times daily with meals. Sliding scale as follows: 121-150=3 units 151-200=4 units 201-250=7 units 251-300=9 units 301-350=12 units 351-400=15 units      . insulin glargine (LANTUS) 100 UNIT/ML injection Inject 5 Units into the skin at bedtime.      Marland Kitchen ipratropium-albuterol (DUONEB) 0.5-2.5 (3) MG/3ML SOLN Take 3 mLs by nebulization 4 (four) times daily.      . magnesium hydroxide (MILK OF MAGNESIA) 400 MG/5ML suspension Take 30 mLs by mouth daily as needed for constipation.      . pantoprazole (PROTONIX) 40 MG tablet Take 1 tablet (40 mg total) by mouth 2 (two) times daily before a meal.  60 tablet  12  . predniSONE (DELTASONE) 10 MG tablet Take 5 mg by mouth daily with breakfast.      . sodium chloride (OCEAN) 0.65 % SOLN nasal spray Place 1 spray into the nose as needed for congestion.    0  . acetaminophen (TYLENOL) 500 MG tablet Take 1,000 mg by mouth every 6 (six) hours as needed. Pain       Scheduled: . albuterol  2.5 mg Nebulization Q4H  . aspirin  81 mg Oral Daily  . ceFEPime (MAXIPIME) IV  1 g Intravenous Q24H  . clotrimazole    Topical BID  . diltiazem  240 mg Oral BID  . feeding supplement  30 mL Oral BID BM  . ferrous fumarate  1 tablet Oral BID  . fluticasone  1 puff Inhalation BID  . furosemide  40 mg Intravenous Q12H  . guaiFENesin  600 mg Oral BID  . insulin aspart  0-15 Units Subcutaneous TID WC  . insulin aspart  0-5 Units Subcutaneous QHS  . insulin aspart  3 Units Subcutaneous TID WC  . insulin glargine  15 Units Subcutaneous QHS  . ipratropium  0.5 mg Nebulization Q4H  . methylPREDNISolone (SOLU-MEDROL) injection  125 mg Intravenous Q6H  . multivitamin with minerals  1 tablet Oral Daily  . pantoprazole  40 mg Oral BID AC  . sertraline  50 mg Oral Daily  . sodium chloride  3 mL Intravenous Q12H  . sodium chloride  3 mL Intravenous Q12H  . vancomycin  750 mg Intravenous Q24H   Continuous:  ZOX:WRUEAV chloride, acetaminophen, acetaminophen, albuterol, bisacodyl, fentaNYL, midazolam, ondansetron (ZOFRAN) IV, ondansetron, sodium chloride, sodium chloride  Assesment: He was admitted with acute on chronic respiratory failure multifactorial but related to healthcare associated pneumonia oral effusion diastolic heart failure and COPD. He had Pleurx cath placed yesterday. He was more anemic and required blood. He is improving but still requiring mask oxygen. His renal function is slightly worse but I think we may have to accept slightly worse renal function to control his chronic diastolic CHF. He has not been as active last few days Active Problems:   Atrial flutter   COPD (chronic obstructive pulmonary disease)   Hypertension   CKD (chronic kidney disease) stage 3, GFR 30-59 ml/min   Type 2 diabetes mellitus, uncontrolled   Chronic diastolic congestive heart failure   Acute-on-chronic respiratory failure   HCAP (healthcare-associated pneumonia)   Anemia, normocytic normochromic   Pleural effusion   PUD (peptic ulcer disease)    Plan: Try to get him up today continue with his antibiotics steroids  diuretics and see if we can taper his oxygen. I increased his Lantus    LOS: 7 days   Kafi Dotter L 08/05/2013, 8:38 AM

## 2013-08-05 NOTE — Progress Notes (Signed)
Patient up to chair for 4 hours, tolerated well.

## 2013-08-05 NOTE — Progress Notes (Signed)
PleurX catheter dressing changed this morning. No signs of infection. Nurse educated patient about the signs of infection, and when he needed to call the doctor. Education was started on how to change the catheter bag. Patient also taught about how to clamp tubing if a tear happens and other necessary teaching given. Teach back was used. Patient very receptive.

## 2013-08-05 NOTE — Progress Notes (Signed)
Physical Therapy Treatment Patient Details Name: [name] [name] MRN: [id number] DOB: September 25, 1936 Today's Date: 08/05/2013 Time: 1330-1410 PT Time Calculation (min): 40 min  PT Assessment / Plan / Recommendation     PT Comments   Pt hesitant to participate due to SOB but able to convince. Low activity tolerance at this point due to respiratory compromise.  Able to tolerate 30" standing before fatiqued; unable to attempt ambulation today.  Rest required between each exercise activity due to decrease in 02 sats 86-88%.  Pt compliant with participation.   Follow Up Recommendations  Home health PT           Equipment Recommendations  None recommended by PT       Frequency Min 3X/week                Mobility  Transfers Transfers: Sit to Stand Sit to Stand: 4: Min assist Details for Transfer Assistance: sit to stand from recliner X 2 ; stand tolerance 30" with 5 minute rest between.  O2 sat decreased to 86% upon exertion.    Exercises General Exercises - Lower Extremity Ankle Circles/Pumps: AROM;Both;20 reps;Seated Long Arc Quad: AROM;Both;10 reps;Seated Heel Slides: AROM;Both;10 reps;Seated Hip ABduction/ADduction: AROM;Both;10 reps;Seated Straight Leg Raises: AROM;Both;10 reps;Seated    Visit Information  Last PT Received On: 08/05/13             End of Session PT - End of Session Activity Tolerance: Other (comment) (limited by respiratory status) Patient left: in chair;with call bell/phone within reach    Lurena Nida, PTA/CLT 08/05/2013, 3:04 PM

## 2013-08-05 NOTE — Progress Notes (Signed)
Inpatient Diabetes Program Recommendations  AACE/ADA: New Consensus Statement on Inpatient Glycemic Control (2013)  Target Ranges:  Prepandial:   less than 140 mg/dL      Peak postprandial:   less than 180 mg/dL (1-2 hours)      Critically ill patients:  140 - 180 mg/dL   Results for [name] [name], [name] [name] (MRN [id number]) as of 08/05/2013 07:18  Ref. Range 08/03/2013 07:25 08/03/2013 11:37 08/03/2013 17:12 08/03/2013 20:34 08/04/2013 07:58 08/04/2013 16:33 08/04/2013 21:33  Glucose-Capillary Latest Range: 70-99 mg/dL 409 (H) 811 (H) 914 (H) 295 (H) 297 (H) 273 (H) 316 (H)   Results for [name] [name], [name] [name] (MRN [id number]) as of 08/05/2013 07:18  Ref. Range 08/05/2013 05:15  Glucose Latest Range: 70-99 mg/dL 086 (H)   Inpatient Diabetes Program Recommendations Insulin - Basal: Please consider increasing Lantus to 15 units QHS. Insulin - Meal Coverage: Please conisder increasing Novolog meal coverage to 5 units TID with meals.  Note: Blood glucose over the past 24 hours has ranged from 273-359 mg/dl and fasting lab glucose this morning was 359 mg/dl.  Note that patient continues to receive Solumedrol 125 mg Q6H which is contributing to hyperglycemia.  Please consider increasing Lantus to 15 units QHS and increasing Novolog meal coverage to 5 units TID with meals.    Thanks, Orlando Penner, RN, MSN, CCRN Diabetes Coordinator Inpatient Diabetes Program [phone]

## 2013-08-06 ENCOUNTER — Inpatient Hospital Stay (HOSPITAL_COMMUNITY): Payer: Medicare Other

## 2013-08-06 LAB — BASIC METABOLIC PANEL
BUN: 95 mg/dL — ABNORMAL HIGH (ref 6–23)
Creatinine, Ser: 2.19 mg/dL — ABNORMAL HIGH (ref 0.50–1.35)
GFR calc Af Amer: 32 mL/min — ABNORMAL LOW (ref >90)
GFR calc non Af Amer: 28 mL/min — ABNORMAL LOW (ref >90)

## 2013-08-06 LAB — CBC
MCHC: 31.5 g/dL (ref 30.0–36.0)
Platelets: 249 10*3/uL (ref 150–400)
RDW: 17.6 % — ABNORMAL HIGH (ref 11.5–15.5)
WBC: 10.9 10*3/uL — ABNORMAL HIGH (ref 4.0–10.5)

## 2013-08-06 LAB — GLUCOSE, CAPILLARY: Glucose-Capillary: 209 mg/dL — ABNORMAL HIGH (ref 70–99)

## 2013-08-06 MED ORDER — METHYLPREDNISOLONE SODIUM SUCC 40 MG IJ SOLR
40.0000 mg | Freq: Four times a day (QID) | INTRAMUSCULAR | Status: DC
  Administered 2013-08-06 – 2013-08-11 (×18): 40 mg via INTRAVENOUS
  Filled 2013-08-06 (×18): qty 1

## 2013-08-06 MED ORDER — INSULIN ASPART 100 UNIT/ML ~~LOC~~ SOLN
0.0000 [IU] | Freq: Every day | SUBCUTANEOUS | Status: DC
  Administered 2013-08-06 – 2013-08-10 (×3): 2 [IU] via SUBCUTANEOUS

## 2013-08-06 MED ORDER — FLEET ENEMA 7-19 GM/118ML RE ENEM
1.0000 | ENEMA | Freq: Once | RECTAL | Status: AC
  Administered 2013-08-06: 1 via RECTAL

## 2013-08-06 MED ORDER — INSULIN GLARGINE 100 UNIT/ML ~~LOC~~ SOLN
20.0000 [IU] | Freq: Every day | SUBCUTANEOUS | Status: DC
  Administered 2013-08-06 – 2013-08-10 (×5): 20 [IU] via SUBCUTANEOUS
  Filled 2013-08-06 (×5): qty 0.2

## 2013-08-06 MED ORDER — INSULIN ASPART 100 UNIT/ML ~~LOC~~ SOLN
0.0000 [IU] | Freq: Three times a day (TID) | SUBCUTANEOUS | Status: DC
  Administered 2013-08-06: 20 [IU] via SUBCUTANEOUS
  Administered 2013-08-06: 7 [IU] via SUBCUTANEOUS
  Administered 2013-08-07: 4 [IU] via SUBCUTANEOUS
  Administered 2013-08-07: 15 [IU] via SUBCUTANEOUS
  Administered 2013-08-07: 20 [IU] via SUBCUTANEOUS
  Administered 2013-08-08: 7 [IU] via SUBCUTANEOUS
  Administered 2013-08-08 (×2): 15 [IU] via SUBCUTANEOUS
  Administered 2013-08-09: 11 [IU] via SUBCUTANEOUS
  Administered 2013-08-09: 20 [IU] via SUBCUTANEOUS
  Administered 2013-08-09 – 2013-08-10 (×3): 15 [IU] via SUBCUTANEOUS
  Administered 2013-08-10 – 2013-08-11 (×2): 11 [IU] via SUBCUTANEOUS
  Administered 2013-08-11: 7 [IU] via SUBCUTANEOUS
  Administered 2013-08-11: 11 [IU] via SUBCUTANEOUS
  Administered 2013-08-12: 3 [IU] via SUBCUTANEOUS
  Administered 2013-08-13: 4 [IU] via SUBCUTANEOUS
  Administered 2013-08-13: 7 [IU] via SUBCUTANEOUS

## 2013-08-06 NOTE — Progress Notes (Signed)
Subjective: He says he feels better. He was able to sit up in a chair for about 4 hours yesterday. He says he is not sleeping because of the way his Lasix as ordered so I've altered that.  Objective: Vital signs in last 24 hours: Temp:  [97.4 F (36.3 C)-97.9 F (36.6 C)] 97.4 F (36.3 C) (08/07 0500) Pulse Rate:  [96-103] 96 (08/07 0500) Resp:  [20] 20 (08/07 0500) BP: (120-127)/(63-67) 122/67 mmHg (08/07 0500) SpO2:  [89 %-94 %] 91 % (08/07 0733) FiO2 (%):  [6 %-50 %] 6 % (08/06 2329) Weight:  [72.2 kg (159 lb 2.8 oz)] 72.2 kg (159 lb 2.8 oz) (08/07 0500) Weight change: -0.7 kg (-1 lb 8.7 oz) Last BM Date: 08/02/13  Intake/Output from previous day: 08/06 0701 - 08/07 0700 In: 630 [P.O.:480; IV Piggyback:150] Out: 1550 [Urine:750; Chest Tube:800]  PHYSICAL EXAM General appearance: alert, cooperative and mild distress Resp: clear to auscultation bilaterally Cardio: regular rate and rhythm, S1, S2 normal, no murmur, click, rub or gallop GI: soft, non-tender; bowel sounds normal; no masses,  no organomegaly Extremities: 1+ edema of his feet  Lab Results:    Basic Metabolic Panel:  Recent Labs  16/10/96 0515 08/06/13 0518  NA 136 133*  K 3.8 3.8  CL 90* 89*  CO2 34* 32  GLUCOSE 359* 393*  BUN 88* 95*  CREATININE 2.24* 2.19*  CALCIUM 9.9 9.8   Liver Function Tests: No results found for this basename: AST, ALT, ALKPHOS, BILITOT, PROT, ALBUMIN,  in the last 72 hours No results found for this basename: LIPASE, AMYLASE,  in the last 72 hours No results found for this basename: AMMONIA,  in the last 72 hours CBC:  Recent Labs  08/04/13 0517 08/06/13 0518  WBC 6.2 10.9*  NEUTROABS 5.5  --   HGB 9.6* 10.3*  HCT 30.7* 32.7*  MCV 86.7 86.1  PLT 245 249   Cardiac Enzymes: No results found for this basename: CKTOTAL, CKMB, CKMBINDEX, TROPONINI,  in the last 72 hours BNP: No results found for this basename: PROBNP,  in the last 72 hours D-Dimer: No results found  for this basename: DDIMER,  in the last 72 hours CBG:  Recent Labs  08/04/13 2133 08/05/13 0718 08/05/13 1159 08/05/13 1659 08/05/13 2103 08/06/13 0729  GLUCAP 316* 376* 445* 320* 173* 394*   Hemoglobin A1C: No results found for this basename: HGBA1C,  in the last 72 hours Fasting Lipid Panel: No results found for this basename: CHOL, HDL, LDLCALC, TRIG, CHOLHDL, LDLDIRECT,  in the last 72 hours Thyroid Function Tests: No results found for this basename: TSH, T4TOTAL, FREET4, T3FREE, THYROIDAB,  in the last 72 hours Anemia Panel: No results found for this basename: VITAMINB12, FOLATE, FERRITIN, TIBC, IRON, RETICCTPCT,  in the last 72 hours Coagulation: No results found for this basename: LABPROT, INR,  in the last 72 hours Urine Drug Screen: Drugs of Abuse  No results found for this basename: labopia, cocainscrnur, labbenz, amphetmu, thcu, labbarb    Alcohol Level: No results found for this basename: ETH,  in the last 72 hours Urinalysis: No results found for this basename: COLORURINE, APPERANCEUR, LABSPEC, PHURINE, GLUCOSEU, HGBUR, BILIRUBINUR, KETONESUR, PROTEINUR, UROBILINOGEN, NITRITE, LEUKOCYTESUR,  in the last 72 hours Misc. Labs:  ABGS No results found for this basename: PHART, PCO2, PO2ART, TCO2, HCO3,  in the last 72 hours CULTURES Recent Results (from the past 240 hour(s))  CULTURE, EXPECTORATED SPUTUM-ASSESSMENT     Status: None   Collection Time  07/30/13  6:56 AM      Result Value Range Status   Specimen Description SPUTUM EXPECTORATED   Final   Special Requests NONE   Final   Sputum evaluation     Final   Value: MICROSCOPIC FINDINGS SUGGEST THAT THIS SPECIMEN IS NOT REPRESENTATIVE OF LOWER RESPIRATORY SECRETIONS. PLEASE RECOLLECT.     Results Called to: [facility]. AT 1119 ON 07/30/2013 BY BAUGHAM,M.     Performed at [facility]   Report Status 07/30/2013 FINAL   Final   Studies/Results: Ir US Guide Bx Asp/drain  08/04/2013   *RADIOLOGY  REPORT*  Clinical Data/Indication: RECURRENT LEFT PLEURAL EFFUSION  RADIOLOGY EXAMINATION,IR ULTRASOUND GUIDANCE TISSUE ABLATION  Sedation: Versed 1.0 mg, Fentanyl 25 mg.  Total Moderate Sedation Time: 15 minutes.  Vancomycin was given within two hours of incision.  Vancomycin was given due to an antibiotic allergy.  Fluoroscopy Time: 24 seconds.  Procedure: The procedure, risks, benefits, and alternatives were explained to the patient. Questions regarding the procedure were encouraged and answered. The patient understands and consents to the procedure.  The left lower thorax was prepped with betadine in a sterile fashion, and a sterile drape was applied covering the operative field. A sterile gown and sterile gloves were used for the procedure.  Under sonographic guidance, an 18 gauge needle was inserted into the left pleural space containing fluid at the midaxillary line and lower rib cage.  It was removed over an Amplatz.  A peel-away sheath was inserted.  A second incision was made anteriorly 10 cm. The leading edge of the catheter was then advanced from the second incision help the puncture site incision then fed through the peel- away sheath.  The peel-away sheath was removed.  The hub of the catheter was constructed after removing the stylet.  Fluid was removed.  The initial site was closed with a 3-0 Monocryl stitch for the subcutaneous tissues and a 4-0 Vicryl subcuticular stitch. The second site was closed with an O Prolene pursestring knot.  Findings: The image demonstrates placement of a left Pleurx drain.  Complications: None.  IMPRESSION: Successful left Pleurx drain.   Original Report Authenticated By: Jolaine Click, M.D.   Ir Perc Pleural Drain W/indwell Cath W/img Guide  08/04/2013   *RADIOLOGY REPORT*  Clinical Data/Indication: RECURRENT LEFT PLEURAL EFFUSION  RADIOLOGY EXAMINATION,IR ULTRASOUND GUIDANCE TISSUE ABLATION  Sedation: Versed 1.0 mg, Fentanyl 25 mg.  Total Moderate Sedation Time: 15  minutes.  Vancomycin was given within two hours of incision.  Vancomycin was given due to an antibiotic allergy.  Fluoroscopy Time: 24 seconds.  Procedure: The procedure, risks, benefits, and alternatives were explained to the patient. Questions regarding the procedure were encouraged and answered. The patient understands and consents to the procedure.  The left lower thorax was prepped with betadine in a sterile fashion, and a sterile drape was applied covering the operative field. A sterile gown and sterile gloves were used for the procedure.  Under sonographic guidance, an 18 gauge needle was inserted into the left pleural space containing fluid at the midaxillary line and lower rib cage.  It was removed over an Amplatz.  A peel-away sheath was inserted.  A second incision was made anteriorly 10 cm. The leading edge of the catheter was then advanced from the second incision help the puncture site incision then fed through the peel- away sheath.  The peel-away sheath was removed.  The hub of the catheter was constructed after removing the stylet.  Fluid was removed.  The initial site was closed with a 3-0 Monocryl stitch for the subcutaneous tissues and a 4-0 Vicryl subcuticular stitch. The second site was closed with an O Prolene pursestring knot.  Findings: The image demonstrates placement of a left Pleurx drain.  Complications: None.  IMPRESSION: Successful left Pleurx drain.   Original Report Authenticated By: Jolaine Click, M.D.    Medications:  Prior to Admission:  Prescriptions prior to admission  Medication Sig Dispense Refill  . aspirin 81 MG chewable tablet Chew 81 mg by mouth daily.      Marland Kitchen diltiazem (CARDIZEM CD) 240 MG 24 hr capsule Take 1 capsule (240 mg total) by mouth 2 (two) times daily.  60 capsule  12  . fluticasone (FLOVENT HFA) 110 MCG/ACT inhaler Inhale 1 puff into the lungs 2 (two) times daily.      . furosemide (LASIX) 40 MG tablet Take 2 tablets (80 mg total) by mouth 2 (two) times  daily.  30 tablet  12  . glyBURIDE (DIABETA) 5 MG tablet Take 5 mg by mouth daily with breakfast.      . insulin aspart (NOVOLOG) 100 UNIT/ML injection Inject 3-15 Units into the skin 3 (three) times daily with meals. Sliding scale as follows: 121-150=3 units 151-200=4 units 201-250=7 units 251-300=9 units 301-350=12 units 351-400=15 units      . insulin glargine (LANTUS) 100 UNIT/ML injection Inject 5 Units into the skin at bedtime.      Marland Kitchen ipratropium-albuterol (DUONEB) 0.5-2.5 (3) MG/3ML SOLN Take 3 mLs by nebulization 4 (four) times daily.      . magnesium hydroxide (MILK OF MAGNESIA) 400 MG/5ML suspension Take 30 mLs by mouth daily as needed for constipation.      . pantoprazole (PROTONIX) 40 MG tablet Take 1 tablet (40 mg total) by mouth 2 (two) times daily before a meal.  60 tablet  12  . predniSONE (DELTASONE) 10 MG tablet Take 5 mg by mouth daily with breakfast.      . sodium chloride (OCEAN) 0.65 % SOLN nasal spray Place 1 spray into the nose as needed for congestion.    0  . acetaminophen (TYLENOL) 500 MG tablet Take 1,000 mg by mouth every 6 (six) hours as needed. Pain       Scheduled: . albuterol  2.5 mg Nebulization Q4H  . aspirin  81 mg Oral Daily  . ceFEPime (MAXIPIME) IV  1 g Intravenous Q24H  . clotrimazole   Topical BID  . diltiazem  240 mg Oral BID  . feeding supplement  30 mL Oral BID BM  . ferrous fumarate  1 tablet Oral BID  . fluticasone  1 puff Inhalation BID  . furosemide  40 mg Intravenous BID AC  . guaiFENesin  600 mg Oral BID  . insulin aspart  0-15 Units Subcutaneous TID WC  . insulin aspart  0-5 Units Subcutaneous QHS  . insulin aspart  8 Units Subcutaneous TID WC  . insulin glargine  15 Units Subcutaneous QHS  . ipratropium  0.5 mg Nebulization Q4H  . methylPREDNISolone (SOLU-MEDROL) injection  125 mg Intravenous Q6H  . multivitamin with minerals  1 tablet Oral Daily  . pantoprazole  40 mg Oral BID AC  . sertraline  50 mg Oral Daily  . sodium  chloride  3 mL Intravenous Q12H  . sodium chloride  3 mL Intravenous Q12H  . vancomycin  750 mg Intravenous Q24H   Continuous:  WUJ:WJXBJY chloride, acetaminophen, acetaminophen, albuterol, bisacodyl, fentaNYL, midazolam, ondansetron (ZOFRAN) IV, ondansetron, sodium  chloride, sodium chloride  Assesment: He was admitted with acute on chronic respiratory failure related to healthcare associated pneumonia. In addition to that he has chronic diastolic congestive heart failure a recurrent left pleural effusion which now has been drained with a Pleural catheter, renal failure and his kidney function seems pretty stable but is somewhat worse than at admission but I think that's because of diaphoresis. He has diabetes mellitus which is pretty well controlled. He has had anemia and received blood his hemoglobin level is much better. Overall he has improved. I'm going to try to get him up and moving around and I think once he is ambulatory and doing pretty well with that he can go back home. He had been having high oxygen requirements but he is back now on nasal cannula. Active Problems:   Atrial flutter   COPD (chronic obstructive pulmonary disease)   Hypertension   CKD (chronic kidney disease) stage 3, GFR 30-59 ml/min   Type 2 diabetes mellitus, uncontrolled   Chronic diastolic congestive heart failure   Acute-on-chronic respiratory failure   HCAP (healthcare-associated pneumonia)   Anemia, normocytic normochromic   Pleural effusion   PUD (peptic ulcer disease)    Plan: Continue with current medications.    LOS: 8 days   Jesus Nevills L 08/06/2013, 8:15 AM

## 2013-08-06 NOTE — Progress Notes (Signed)
BG greater than 400. MD made aware, orders given

## 2013-08-06 NOTE — Progress Notes (Signed)
Physical Therapy Treatment Patient Details Name: [name] [name] MRN: [id number] DOB: 1936-10-15 Today's Date: 08/06/2013 Time: 4098-1191 PT Time Calculation (min): 33 min  PT Assessment / Plan / Recommendation  History of Present Illness     PT Comments    Pt now on nasal cannula at 6 L/min.  Able to tolerate therapeutic exercise in the bed for generalized strengthening.  Able to sit at EOB, stand and ambulate 20' with a walker and SBA.  O2 sat down to 74% but returning to baseline with rest.  He is now up in a chair.  Will hopefully be able to progress gait endurance daily.  Follow Up Recommendations        Does the patient have the potential to tolerate intense rehabilitation     Barriers to Discharge        Equipment Recommendations       Recommendations for Other Services    Frequency     Progress towards PT Goals Progress towards PT goals: Progressing toward goals  Plan Current plan remains appropriate    Precautions / Restrictions Restrictions Weight Bearing Restrictions: No   Pertinent Vitals/Pain     Mobility  Bed Mobility Supine to Sit: 4: Min assist;HOB elevated Transfers Transfers: Sit to Stand;Stand to Sit Sit to Stand: 4: Min guard;From bed Stand to Sit: 5: Supervision Ambulation/Gait Ambulation/Gait Assistance: 5: Supervision Ambulation Distance (Feet): 20 Feet Assistive device: Rolling walker Gait Pattern: Trunk flexed Gait velocity: WNL General Gait Details: O2 sat to 74% on 6 L O2 Stairs: No Wheelchair Mobility Wheelchair Mobility: No    Exercises General Exercises - Lower Extremity Ankle Circles/Pumps: AROM;Both;10 reps;Supine Quad Sets: AROM;Both;10 reps;Supine Short Arc Quad: AROM;Both;10 reps;Supine Heel Slides: AROM;Both;10 reps;Supine Hip ABduction/ADduction: AROM;Both;10 reps;Supine   PT Diagnosis:    PT Problem List:   PT Treatment Interventions:     PT Goals (current goals can now be found in the care plan section)    Visit  Information  Last PT Received On: 08/06/13    Subjective Data      Cognition  Cognition Arousal/Alertness: Awake/alert Behavior During Therapy: [facility] for tasks assessed/performed Overall Cognitive Status: Within Functional Limits for tasks assessed    Balance     End of Session PT - End of Session Equipment Utilized During Treatment: Gait belt;Oxygen Activity Tolerance: Patient tolerated treatment well Patient left: in chair;with call bell/phone within reach;with chair alarm set;with family/visitor present Nurse Communication: Mobility status   GP     [name] [name] 08/06/2013, 9:21 AM

## 2013-08-06 NOTE — Progress Notes (Signed)
Fleets enema given, patient able to hold in for approximately 2 minutes.  No BM.

## 2013-08-06 NOTE — Progress Notes (Signed)
Inpatient Diabetes Program Recommendations  AACE/ADA: New Consensus Statement on Inpatient Glycemic Control (2013)  Target Ranges:  Prepandial:   less than 140 mg/dL      Peak postprandial:   less than 180 mg/dL (1-2 hours)      Critically ill patients:  140 - 180 mg/dL   Results for DORION, PETILLO (MRN [id number]) as of 08/06/2013 09:09  Ref. Range 08/05/2013 07:18 08/05/2013 11:59 08/05/2013 16:59 08/05/2013 21:03 08/06/2013 07:29  Glucose-Capillary Latest Range: 70-99 mg/dL 119 (H) 147 (H) 829 (H) 173 (H) 394 (H)   Inpatient Diabetes Program Recommendations Insulin - Basal: Please consider increasing Lantus to 20 units QHS. Correction (SSI): Please consider increasing Novolog correction to resistant scale.   Note: Blood glucose over the past 24 hours has ranged from 173-445 mg/dl.  Note that patient continues to be on Solumedrol 125 mg Q6H which is contributing to hyperglycemia.  Please consider increasing Lantus to 20 units QHS and increase Novolog correction to resistant scale.  Will continue to follow.  Thanks, Orlando Penner, RN, MSN, CCRN Diabetes Coordinator Inpatient Diabetes Program [phone]

## 2013-08-07 LAB — BASIC METABOLIC PANEL
CO2: 31 mEq/L (ref 19–32)
Chloride: 88 mEq/L — ABNORMAL LOW (ref 96–112)
GFR calc non Af Amer: 27 mL/min — ABNORMAL LOW (ref >90)
Glucose, Bld: 381 mg/dL — ABNORMAL HIGH (ref 70–99)
Potassium: 3.7 mEq/L (ref 3.5–5.1)
Sodium: 134 mEq/L — ABNORMAL LOW (ref 135–145)

## 2013-08-07 LAB — CBC WITH DIFFERENTIAL/PLATELET
Basophils Absolute: 0 10*3/uL (ref 0.0–0.1)
Basophils Relative: 0 % (ref 0–1)
HCT: 35.2 % — ABNORMAL LOW (ref 39.0–52.0)
MCHC: 30.7 g/dL (ref 30.0–36.0)
Monocytes Absolute: 0.7 10*3/uL (ref 0.1–1.0)
Neutro Abs: 14.2 10*3/uL — ABNORMAL HIGH (ref 1.7–7.7)
RDW: 17.6 % — ABNORMAL HIGH (ref 11.5–15.5)

## 2013-08-07 LAB — GLUCOSE, CAPILLARY: Glucose-Capillary: 367 mg/dL — ABNORMAL HIGH (ref 70–99)

## 2013-08-07 MED ORDER — VANCOMYCIN HCL IN DEXTROSE 1-5 GM/200ML-% IV SOLN
1000.0000 mg | INTRAVENOUS | Status: DC
Start: 2013-08-08 — End: 2013-08-11
  Administered 2013-08-08 – 2013-08-10 (×2): 1000 mg via INTRAVENOUS
  Filled 2013-08-07 (×2): qty 200

## 2013-08-07 NOTE — Progress Notes (Signed)
UR Chart Review Completed  

## 2013-08-07 NOTE — Progress Notes (Signed)
Physical Therapy Treatment Patient Details Name: [name] [name] MRN: [id number] DOB: 02-01-1936 Today's Date: 08/07/2013 Time: 4098-1191 PT Time Calculation (min): 34 min  PT Assessment / Plan / Recommendation  History of Present Illness     PT Comments   Pt c/o general malaise today...definately not feeling as well as yesterday.  He remains on nasal cannula at 6 L/min ane resting O2 sats are 93%.  He was able to tolerate bed exercise with no drop in O2 sat.  He stood at bedside for about 1 minute in order to urinate and O2 sat decreased to 80%.  He was so exhausted from this, he was unable to ambulate as we had planned.  He was able to transfer bed to chair with a walker.  His chest secretions sounded somewhat looser at the end of treatment.  Follow Up Recommendations        Does the patient have the potential to tolerate intense rehabilitation     Barriers to Discharge        Equipment Recommendations       Recommendations for Other Services    Frequency     Progress towards PT Goals Progress towards PT goals: Not progressing toward goals - comment (increased malaise today)  Plan Current plan remains appropriate    Precautions / Restrictions     Pertinent Vitals/Pain     Mobility  Bed Mobility Supine to Sit: 4: Min assist;HOB elevated Transfers Sit to Stand: 4: Min guard;With upper extremity assist;From bed Stand to Sit: 5: Supervision;To chair/3-in-1;With upper extremity assist Stand Pivot Transfers: 5: Supervision (using a walker) Details for Transfer Assistance: Pt was able to stand at bedside in order to urinate for about 1 minute, O2 sat to 80% with significant dyspnea Ambulation/Gait Ambulation/Gait Assistance: Not tested (comment) (too fatigued)    Exercises General Exercises - Lower Extremity Ankle Circles/Pumps: AROM;Both;10 reps;Supine Quad Sets: AROM;Both;10 reps;Supine Gluteal Sets: AROM;Both;10 reps;Supine Short Arc Quad: AROM;Both;10 reps;Supine Heel  Slides: AROM;Both;10 reps;Supine Hip ABduction/ADduction: AROM;Both;10 reps;Supine   PT Diagnosis:    PT Problem List:   PT Treatment Interventions:     PT Goals (current goals can now be found in the care plan section)    Visit Information  Last PT Received On: 08/07/13    Subjective Data      Cognition       Balance     End of Session PT - End of Session Equipment Utilized During Treatment: Gait belt;Oxygen Activity Tolerance: Patient limited by fatigue Patient left: in chair;with call bell/phone within reach;with family/visitor present   GP     Myrlene Broker L 08/07/2013, 11:02 AM

## 2013-08-07 NOTE — Progress Notes (Signed)
Vanc trough reported to [facility] [employer]

## 2013-08-07 NOTE — Progress Notes (Signed)
Inpatient Diabetes Program Recommendations  AACE/ADA: New Consensus Statement on Inpatient Glycemic Control (2013)  Target Ranges:  Prepandial:   less than 140 mg/dL      Peak postprandial:   less than 180 mg/dL (1-2 hours)      Critically ill patients:  140 - 180 mg/dL   Results for RANDOL, ZUMSTEIN (MRN [id number]) as of 08/07/2013 14:11  Ref. Range 08/06/2013 07:29 08/06/2013 12:03 08/06/2013 17:01 08/06/2013 20:57 08/07/2013 07:22 08/07/2013 11:27  Glucose-Capillary Latest Range: 70-99 mg/dL 409 (H) 811 (H) 914 (H) 209 (H) 367 (H) 308 (H)    Inpatient Diabetes Program Recommendations Insulin - Basal: If blood glucose remains elevated despite decrease in steroids, please consider increasing Lantus to 25 units QHS and continue to adjust to maintain inpatient glycemic control.  Note: Blood glucose over the past 24 hours has ranged from 209-428 mg/dl.  Note that steroids have been increased from Solumedrol 125 mg Q6H to Solumedrol 40mg  Q6H.  If blood glucose remains elevated despite decrease in steroids, please consider increasing Lantus to 25 units QHS and continue to adjust to improve inpatient glycemic control.  Will continue to follow.  Thanks, Orlando Penner, RN, MSN, CCRN Diabetes Coordinator Inpatient Diabetes Program [phone]

## 2013-08-07 NOTE — Progress Notes (Signed)
Subjective: He says he has more cough today. He does not look as good. He did have a bowel movement during the night.  Objective: Vital signs in last 24 hours: Temp:  [97.5 F (36.4 C)-97.8 F (36.6 C)] 97.5 F (36.4 C) (08/08 0542) Pulse Rate:  [92-98] 98 (08/08 0542) Resp:  [20] 20 (08/08 0542) BP: (111-126)/(58-67) 116/65 mmHg (08/08 0542) SpO2:  [92 %-95 %] 95 % (08/08 0719) Weight:  [73.12 kg (161 lb 3.2 oz)] 73.12 kg (161 lb 3.2 oz) (08/08 0542) Weight change: 0.92 kg (2 lb 0.5 oz) Last BM Date: 08/02/13 (MD aware, prn suppository)  Intake/Output from previous day: 08/07 0701 - 08/08 0700 In: 680 [P.O.:480; IV Piggyback:200] Out: 600 [Urine:600]  PHYSICAL EXAM General appearance: alert, cooperative, fatigued, mild distress and pale Resp: rhonchi bilaterally Cardio: regular rate and rhythm, S1, S2 normal, no murmur, click, rub or gallop GI: soft, non-tender; bowel sounds normal; no masses,  no organomegaly Extremities: extremities normal, atraumatic, no cyanosis or edema  Lab Results:    Basic Metabolic Panel:  Recent Labs  82/95/62 0518 08/07/13 0547  NA 133* 134*  K 3.8 3.7  CL 89* 88*  CO2 32 31  GLUCOSE 393* 381*  BUN 95* 104*  CREATININE 2.19* 2.27*  CALCIUM 9.8 9.8   Liver Function Tests: No results found for this basename: AST, ALT, ALKPHOS, BILITOT, PROT, ALBUMIN,  in the last 72 hours No results found for this basename: LIPASE, AMYLASE,  in the last 72 hours No results found for this basename: AMMONIA,  in the last 72 hours CBC:  Recent Labs  08/06/13 0518  WBC 10.9*  HGB 10.3*  HCT 32.7*  MCV 86.1  PLT 249   Cardiac Enzymes: No results found for this basename: CKTOTAL, CKMB, CKMBINDEX, TROPONINI,  in the last 72 hours BNP: No results found for this basename: PROBNP,  in the last 72 hours D-Dimer: No results found for this basename: DDIMER,  in the last 72 hours CBG:  Recent Labs  08/05/13 2103 08/06/13 0729 08/06/13 1203  08/06/13 1701 08/06/13 2057 08/07/13 0722  GLUCAP 173* 394* 428* 232* 209* 367*   Hemoglobin A1C: No results found for this basename: HGBA1C,  in the last 72 hours Fasting Lipid Panel: No results found for this basename: CHOL, HDL, LDLCALC, TRIG, CHOLHDL, LDLDIRECT,  in the last 72 hours Thyroid Function Tests: No results found for this basename: TSH, T4TOTAL, FREET4, T3FREE, THYROIDAB,  in the last 72 hours Anemia Panel: No results found for this basename: VITAMINB12, FOLATE, FERRITIN, TIBC, IRON, RETICCTPCT,  in the last 72 hours Coagulation: No results found for this basename: LABPROT, INR,  in the last 72 hours Urine Drug Screen: Drugs of Abuse  No results found for this basename: labopia, cocainscrnur, labbenz, amphetmu, thcu, labbarb    Alcohol Level: No results found for this basename: ETH,  in the last 72 hours Urinalysis: No results found for this basename: COLORURINE, APPERANCEUR, LABSPEC, PHURINE, GLUCOSEU, HGBUR, BILIRUBINUR, KETONESUR, PROTEINUR, UROBILINOGEN, NITRITE, LEUKOCYTESUR,  in the last 72 hours Misc. Labs:  ABGS No results found for this basename: PHART, PCO2, PO2ART, TCO2, HCO3,  in the last 72 hours CULTURES Recent Results (from the past 240 hour(s))  CULTURE, EXPECTORATED SPUTUM-ASSESSMENT     Status: None   Collection Time    07/30/13  6:56 AM      Result Value Range Status   Specimen Description SPUTUM EXPECTORATED   Final   Special Requests NONE   Final  Sputum evaluation     Final   Value: MICROSCOPIC FINDINGS SUGGEST THAT THIS SPECIMEN IS NOT REPRESENTATIVE OF LOWER RESPIRATORY SECRETIONS. PLEASE RECOLLECT.     Results Called to: [facility]. AT 1119 ON 07/30/2013 BY BAUGHAM,M.     Performed at [facility]   Report Status 07/30/2013 FINAL   Final   Studies/Results: Dg Chest Port 1 View  08/06/2013   *RADIOLOGY REPORT*  Clinical Data: History of pneumonia. History of placement of left Pleurx drain.  PORTABLE CHEST - 1 VIEW  Comparison:  08/03/2013.  Findings: There is stable cardiac silhouette enlargement.  Previous median sternotomy has been performed.  Mediastinal and hilar contours appear stable.  Diffuse airspace infiltrative densities are again evident without definite significant change.  Atelectasis and opacity in the inferior left hemithorax is seen. The left basilar drainage tube is present.  IMPRESSION: Cardiac silhouette enlargement. Diffuse airspace infiltrative densities are again evident without definite significant change. Atelectasis and opacity in the inferior left hemithorax is seen. The left basilar drainage tube is present.  No pneumothorax is evident.   Original Report Authenticated By: Onalee Hua Call    Medications:  Prior to Admission:  Prescriptions prior to admission  Medication Sig Dispense Refill  . aspirin 81 MG chewable tablet Chew 81 mg by mouth daily.      Marland Kitchen diltiazem (CARDIZEM CD) 240 MG 24 hr capsule Take 1 capsule (240 mg total) by mouth 2 (two) times daily.  60 capsule  12  . fluticasone (FLOVENT HFA) 110 MCG/ACT inhaler Inhale 1 puff into the lungs 2 (two) times daily.      . furosemide (LASIX) 40 MG tablet Take 2 tablets (80 mg total) by mouth 2 (two) times daily.  30 tablet  12  . glyBURIDE (DIABETA) 5 MG tablet Take 5 mg by mouth daily with breakfast.      . insulin aspart (NOVOLOG) 100 UNIT/ML injection Inject 3-15 Units into the skin 3 (three) times daily with meals. Sliding scale as follows: 121-150=3 units 151-200=4 units 201-250=7 units 251-300=9 units 301-350=12 units 351-400=15 units      . insulin glargine (LANTUS) 100 UNIT/ML injection Inject 5 Units into the skin at bedtime.      Marland Kitchen ipratropium-albuterol (DUONEB) 0.5-2.5 (3) MG/3ML SOLN Take 3 mLs by nebulization 4 (four) times daily.      . magnesium hydroxide (MILK OF MAGNESIA) 400 MG/5ML suspension Take 30 mLs by mouth daily as needed for constipation.      . pantoprazole (PROTONIX) 40 MG tablet Take 1 tablet (40 mg total) by  mouth 2 (two) times daily before a meal.  60 tablet  12  . predniSONE (DELTASONE) 10 MG tablet Take 5 mg by mouth daily with breakfast.      . sodium chloride (OCEAN) 0.65 % SOLN nasal spray Place 1 spray into the nose as needed for congestion.    0  . acetaminophen (TYLENOL) 500 MG tablet Take 1,000 mg by mouth every 6 (six) hours as needed. Pain       Scheduled: . albuterol  2.5 mg Nebulization Q4H  . aspirin  81 mg Oral Daily  . ceFEPime (MAXIPIME) IV  1 g Intravenous Q24H  . clotrimazole   Topical BID  . diltiazem  240 mg Oral BID  . feeding supplement  30 mL Oral BID BM  . ferrous fumarate  1 tablet Oral BID  . fluticasone  1 puff Inhalation BID  . guaiFENesin  600 mg Oral BID  . insulin aspart  0-20 Units Subcutaneous TID WC  . insulin aspart  0-5 Units Subcutaneous QHS  . insulin aspart  8 Units Subcutaneous TID WC  . insulin glargine  20 Units Subcutaneous QHS  . ipratropium  0.5 mg Nebulization Q4H  . methylPREDNISolone (SOLU-MEDROL) injection  40 mg Intravenous Q6H  . multivitamin with minerals  1 tablet Oral Daily  . pantoprazole  40 mg Oral BID AC  . sertraline  50 mg Oral Daily  . sodium chloride  3 mL Intravenous Q12H  . sodium chloride  3 mL Intravenous Q12H  . vancomycin  750 mg Intravenous Q24H   Continuous:  ZOX:WRUEAV chloride, acetaminophen, acetaminophen, albuterol, bisacodyl, fentaNYL, midazolam, ondansetron (ZOFRAN) IV, ondansetron, sodium chloride, sodium chloride  Assesment: He was admitted with acute on chronic respiratory failure related to healthcare associated pneumonia. He also has recurrent left pleural effusion and now has a drainage catheter in place. He has chronic diastolic congestive heart failure which seems to be stable. He has pretty severe COPD at baseline. He has diabetes mellitus which has not been controlled but I think that's a least partially because of the steroids. He was anemic and had a blood transfusion and that had improved. He has  chronic kidney disease which is about the same Active Problems:   Atrial flutter   COPD (chronic obstructive pulmonary disease)   Hypertension   CKD (chronic kidney disease) stage 3, GFR 30-59 ml/min   Type 2 diabetes mellitus, uncontrolled   Chronic diastolic congestive heart failure   Acute-on-chronic respiratory failure   HCAP (healthcare-associated pneumonia)   Anemia, normocytic normochromic   Pleural effusion   PUD (peptic ulcer disease)    Plan: No change in meds right now. I will discontinue Lasix because of his renal dysfunction. I'm concerned that he may have aspirated. He is on antibiotics I was hoping he would be ready to go home soon but he looks much worse today    LOS: 9 days   Placido Hangartner L 08/07/2013, 8:23 AM

## 2013-08-07 NOTE — Progress Notes (Signed)
ANTIBIOTIC CONSULT NOTE  Pharmacy Consult for Vancomycin & Cefepime Indication: pneumonia  No Known Allergies  Patient Measurements: Height: 5\' 6"  (167.6 cm) Weight: 161 lb 3.2 oz (73.12 kg) IBW/kg (Calculated) : 63.8  Vital Signs: Temp: 97.5 F (36.4 C) (08/08 0542) Temp src: Oral (08/08 0542) BP: 116/65 mmHg (08/08 0542) Pulse Rate: 98 (08/08 0542) Intake/Output from previous day: 08/07 0701 - 08/08 0700 In: 680 [P.O.:480; IV Piggyback:200] Out: 600 [Urine:600] Intake/Output from this shift: Total I/O In: 240 [P.O.:240] Out: 575 [Urine:575]  Labs:  Recent Labs  08/05/13 0515 08/06/13 0518 08/07/13 0543 08/07/13 0547  WBC  --  10.9* 15.5*  --   HGB  --  10.3* 10.8*  --   PLT  --  249 301  --   CREATININE 2.24* 2.19*  --  2.27*   Estimated Creatinine Clearance: 25.4 ml/min (by C-G formula based on Cr of 2.27).  Recent Labs  08/07/13 1059  VANCOTROUGH 32.1*    Microbiology: Recent Results (from the past 720 hour(s))  CULTURE, EXPECTORATED SPUTUM-ASSESSMENT     Status: None   Collection Time    07/30/13  6:56 AM      Result Value Range Status   Specimen Description SPUTUM EXPECTORATED   Final   Special Requests NONE   Final   Sputum evaluation     Final   Value: MICROSCOPIC FINDINGS SUGGEST THAT THIS SPECIMEN IS NOT REPRESENTATIVE OF LOWER RESPIRATORY SECRETIONS. PLEASE RECOLLECT.     Results Called to: [facility]. AT 1119 ON 07/30/2013 BY BAUGHAM,M.     Performed at [facility]   Report Status 07/30/2013 FINAL   Final   Medical History: Past Medical History  Diagnosis Date  . COPD (chronic obstructive pulmonary disease)   . AF (atrial fibrillation)   . Hypertension   . Gout   . Arthritis   . Small bowel obstruction   . Renal insufficiency   . Aortic stenosis   . Hydropneumothorax   . Diabetes mellitus   . CHF (congestive heart failure)   . On home O2     qhs prn  . Pneumonia 05/04/2013    history of pneumonia  . Pneumonia 10/12  .  Hydropneumothorax 2012   Medications:  Scheduled:  . albuterol  2.5 mg Nebulization Q4H  . aspirin  81 mg Oral Daily  . ceFEPime (MAXIPIME) IV  1 g Intravenous Q24H  . clotrimazole   Topical BID  . diltiazem  240 mg Oral BID  . feeding supplement  30 mL Oral BID BM  . ferrous fumarate  1 tablet Oral BID  . fluticasone  1 puff Inhalation BID  . guaiFENesin  600 mg Oral BID  . insulin aspart  0-20 Units Subcutaneous TID WC  . insulin aspart  0-5 Units Subcutaneous QHS  . insulin aspart  8 Units Subcutaneous TID WC  . insulin glargine  20 Units Subcutaneous QHS  . ipratropium  0.5 mg Nebulization Q4H  . methylPREDNISolone (SOLU-MEDROL) injection  40 mg Intravenous Q6H  . multivitamin with minerals  1 tablet Oral Daily  . pantoprazole  40 mg Oral BID AC  . sertraline  50 mg Oral Daily  . sodium chloride  3 mL Intravenous Q12H  . sodium chloride  3 mL Intravenous Q12H  . [START ON 08/08/2013] vancomycin  1,000 mg Intravenous Q48H   Assessment: 77 yo M who is on day#10 Vanc & Cefepime for PNA.  He is clinically worse today and remains with acute renal failure.  Vancomycin dose was empirically decreased due to worsening renal failure on 8/4. Trough level today was elevated so will further decrease dose.   Zosyn 7/30 x 1 Cefepime 7/30 >> Vancomycin 7/30 >>  Goal of Therapy:  Vancomycin trough level 15-20 mcg/ml  Plan:  1) Continue Cefepime to 1gm IV q24h (renally adjusted) 2) Decrease Vancomycin to 1gm IV q48h. 3) Recheck Vancomycin trough at steady state if continues. 4) Monitor renal function and cx data per guidelines 5) Duration of therapy per MD  Elson Clan 08/07/2013,12:07 PM

## 2013-08-08 LAB — CBC WITH DIFFERENTIAL/PLATELET
Basophils Absolute: 0 10*3/uL (ref 0.0–0.1)
Eosinophils Absolute: 0 10*3/uL (ref 0.0–0.7)
Lymphs Abs: 0.9 10*3/uL (ref 0.7–4.0)
MCH: 27 pg (ref 26.0–34.0)
Neutrophils Relative %: 89 % — ABNORMAL HIGH (ref 43–77)
Platelets: 214 10*3/uL (ref 150–400)
RBC: 3.74 MIL/uL — ABNORMAL LOW (ref 4.22–5.81)
RDW: 17.5 % — ABNORMAL HIGH (ref 11.5–15.5)
WBC: 14.1 10*3/uL — ABNORMAL HIGH (ref 4.0–10.5)

## 2013-08-08 LAB — BASIC METABOLIC PANEL
BUN: 103 mg/dL — ABNORMAL HIGH (ref 6–23)
CO2: 33 mEq/L — ABNORMAL HIGH (ref 19–32)
Calcium: 9.4 mg/dL (ref 8.4–10.5)
Chloride: 87 mEq/L — ABNORMAL LOW (ref 96–112)
Creatinine, Ser: 2.01 mg/dL — ABNORMAL HIGH (ref 0.50–1.35)
GFR calc Af Amer: 36 mL/min — ABNORMAL LOW (ref >90)
GFR calc non Af Amer: 31 mL/min — ABNORMAL LOW (ref >90)
Glucose, Bld: 295 mg/dL — ABNORMAL HIGH (ref 70–99)
Potassium: 3.4 mEq/L — ABNORMAL LOW (ref 3.5–5.1)
Sodium: 131 mEq/L — ABNORMAL LOW (ref 135–145)

## 2013-08-08 LAB — GLUCOSE, CAPILLARY
Glucose-Capillary: 345 mg/dL — ABNORMAL HIGH (ref 70–99)
Glucose-Capillary: 345 mg/dL — ABNORMAL HIGH (ref 70–99)
Glucose-Capillary: 167 mg/dL — ABNORMAL HIGH (ref 70–99)

## 2013-08-08 NOTE — Progress Notes (Signed)
Subjective: He looks much better today. He is less short of breath. He is still very weak.  Objective: Vital signs in last 24 hours: Temp:  [97.4 F (36.3 C)-97.9 F (36.6 C)] 97.5 F (36.4 C) (08/09 0258) Pulse Rate:  [91-106] 106 (08/09 0258) Resp:  [20-24] 24 (08/09 0258) BP: (119-130)/(67-68) 130/67 mmHg (08/09 0258) SpO2:  [91 %-98 %] 95 % (08/09 0654) Weight:  [75.569 kg (166 lb 9.6 oz)] 75.569 kg (166 lb 9.6 oz) (08/09 0658) Weight change: 2.449 kg (5 lb 6.4 oz) Last BM Date: 08/07/13  Intake/Output from previous day: 08/08 0701 - 08/09 0700 In: 600 [P.O.:600] Out: 1650 [Urine:1600; Chest Tube:50]  PHYSICAL EXAM General appearance: alert, cooperative and mild distress Resp: rhonchi bilaterally Cardio: regular rate and rhythm, S1, S2 normal, no murmur, click, rub or gallop GI: soft, non-tender; bowel sounds normal; no masses,  no organomegaly Extremities: extremities normal, atraumatic, no cyanosis or edema  Lab Results:    Basic Metabolic Panel:  Recent Labs  96/04/54 0547 08/08/13 0555  NA 134* 131*  K 3.7 3.4*  CL 88* 87*  CO2 31 33*  GLUCOSE 381* 295*  BUN 104* 103*  CREATININE 2.27* 2.01*  CALCIUM 9.8 9.4   Liver Function Tests: No results found for this basename: AST, ALT, ALKPHOS, BILITOT, PROT, ALBUMIN,  in the last 72 hours No results found for this basename: LIPASE, AMYLASE,  in the last 72 hours No results found for this basename: AMMONIA,  in the last 72 hours CBC:  Recent Labs  08/06/13 0518 08/07/13 0543  WBC 10.9* 15.5*  NEUTROABS  --  14.2*  HGB 10.3* 10.8*  HCT 32.7* 35.2*  MCV 86.1 87.3  PLT 249 301   Cardiac Enzymes: No results found for this basename: CKTOTAL, CKMB, CKMBINDEX, TROPONINI,  in the last 72 hours BNP: No results found for this basename: PROBNP,  in the last 72 hours D-Dimer: No results found for this basename: DDIMER,  in the last 72 hours CBG:  Recent Labs  08/06/13 2057 08/07/13 0722 08/07/13 1127  08/07/13 1621 08/07/13 2059 08/08/13 0802  GLUCAP 209* 367* 308* 175* 153* 345*   Hemoglobin A1C: No results found for this basename: HGBA1C,  in the last 72 hours Fasting Lipid Panel: No results found for this basename: CHOL, HDL, LDLCALC, TRIG, CHOLHDL, LDLDIRECT,  in the last 72 hours Thyroid Function Tests: No results found for this basename: TSH, T4TOTAL, FREET4, T3FREE, THYROIDAB,  in the last 72 hours Anemia Panel: No results found for this basename: VITAMINB12, FOLATE, FERRITIN, TIBC, IRON, RETICCTPCT,  in the last 72 hours Coagulation: No results found for this basename: LABPROT, INR,  in the last 72 hours Urine Drug Screen: Drugs of Abuse  No results found for this basename: labopia, cocainscrnur, labbenz, amphetmu, thcu, labbarb    Alcohol Level: No results found for this basename: ETH,  in the last 72 hours Urinalysis: No results found for this basename: COLORURINE, APPERANCEUR, LABSPEC, PHURINE, GLUCOSEU, HGBUR, BILIRUBINUR, KETONESUR, PROTEINUR, UROBILINOGEN, NITRITE, LEUKOCYTESUR,  in the last 72 hours Misc. Labs:  ABGS No results found for this basename: PHART, PCO2, PO2ART, TCO2, HCO3,  in the last 72 hours CULTURES Recent Results (from the past 240 hour(s))  CULTURE, EXPECTORATED SPUTUM-ASSESSMENT     Status: None   Collection Time    07/30/13  6:56 AM      Result Value Range Status   Specimen Description SPUTUM EXPECTORATED   Final   Special Requests NONE   Final  Sputum evaluation     Final   Value: MICROSCOPIC FINDINGS SUGGEST THAT THIS SPECIMEN IS NOT REPRESENTATIVE OF LOWER RESPIRATORY SECRETIONS. PLEASE RECOLLECT.     Results Called to: [facility]. AT 1119 ON 07/30/2013 BY BAUGHAM,M.     Performed at [facility]   Report Status 07/30/2013 FINAL   Final   Studies/Results: No results found.  Medications:  Prior to Admission:  Prescriptions prior to admission  Medication Sig Dispense Refill  . aspirin 81 MG chewable tablet Chew 81 mg by  mouth daily.      Marland Kitchen diltiazem (CARDIZEM CD) 240 MG 24 hr capsule Take 1 capsule (240 mg total) by mouth 2 (two) times daily.  60 capsule  12  . fluticasone (FLOVENT HFA) 110 MCG/ACT inhaler Inhale 1 puff into the lungs 2 (two) times daily.      . furosemide (LASIX) 40 MG tablet Take 2 tablets (80 mg total) by mouth 2 (two) times daily.  30 tablet  12  . glyBURIDE (DIABETA) 5 MG tablet Take 5 mg by mouth daily with breakfast.      . insulin aspart (NOVOLOG) 100 UNIT/ML injection Inject 3-15 Units into the skin 3 (three) times daily with meals. Sliding scale as follows: 121-150=3 units 151-200=4 units 201-250=7 units 251-300=9 units 301-350=12 units 351-400=15 units      . insulin glargine (LANTUS) 100 UNIT/ML injection Inject 5 Units into the skin at bedtime.      Marland Kitchen ipratropium-albuterol (DUONEB) 0.5-2.5 (3) MG/3ML SOLN Take 3 mLs by nebulization 4 (four) times daily.      . magnesium hydroxide (MILK OF MAGNESIA) 400 MG/5ML suspension Take 30 mLs by mouth daily as needed for constipation.      . pantoprazole (PROTONIX) 40 MG tablet Take 1 tablet (40 mg total) by mouth 2 (two) times daily before a meal.  60 tablet  12  . predniSONE (DELTASONE) 10 MG tablet Take 5 mg by mouth daily with breakfast.      . sodium chloride (OCEAN) 0.65 % SOLN nasal spray Place 1 spray into the nose as needed for congestion.    0  . acetaminophen (TYLENOL) 500 MG tablet Take 1,000 mg by mouth every 6 (six) hours as needed. Pain       Scheduled: . albuterol  2.5 mg Nebulization Q4H  . aspirin  81 mg Oral Daily  . ceFEPime (MAXIPIME) IV  1 g Intravenous Q24H  . clotrimazole   Topical BID  . diltiazem  240 mg Oral BID  . feeding supplement  30 mL Oral BID BM  . ferrous fumarate  1 tablet Oral BID  . fluticasone  1 puff Inhalation BID  . guaiFENesin  600 mg Oral BID  . insulin aspart  0-20 Units Subcutaneous TID WC  . insulin aspart  0-5 Units Subcutaneous QHS  . insulin aspart  8 Units Subcutaneous TID WC  .  insulin glargine  20 Units Subcutaneous QHS  . ipratropium  0.5 mg Nebulization Q4H  . methylPREDNISolone (SOLU-MEDROL) injection  40 mg Intravenous Q6H  . multivitamin with minerals  1 tablet Oral Daily  . pantoprazole  40 mg Oral BID AC  . sertraline  50 mg Oral Daily  . sodium chloride  3 mL Intravenous Q12H  . sodium chloride  3 mL Intravenous Q12H  . vancomycin  1,000 mg Intravenous Q48H   Continuous:  ZOX:WRUEAV chloride, acetaminophen, acetaminophen, albuterol, bisacodyl, fentaNYL, midazolam, ondansetron (ZOFRAN) IV, ondansetron, sodium chloride, sodium chloride  Assesment: He was admitted  with healthcare associated pneumonia and acute on chronic respiratory failure. He has had a pleural effusion which has now been drained. He has severe COPD. He has chronic kidney disease and that's a little better today. He has chronic diastolic congestive heart failure and I think that is stable. He has severe deconditioning and he is working with physical therapy Active Problems:   Atrial flutter   COPD (chronic obstructive pulmonary disease)   Hypertension   CKD (chronic kidney disease) stage 3, GFR 30-59 ml/min   Type 2 diabetes mellitus, uncontrolled   Chronic diastolic congestive heart failure   Acute-on-chronic respiratory failure   HCAP (healthcare-associated pneumonia)   Anemia, normocytic normochromic   Pleural effusion   PUD (peptic ulcer disease)    Plan: I don't think he's ready to go home but he is getting closer    LOS: 10 days   [name] [name] L 08/08/2013, 10:40 AM

## 2013-08-09 MED ORDER — FUROSEMIDE 10 MG/ML IJ SOLN
40.0000 mg | Freq: Every day | INTRAMUSCULAR | Status: DC
  Administered 2013-08-09 – 2013-08-13 (×5): 40 mg via INTRAVENOUS
  Filled 2013-08-09 (×5): qty 4

## 2013-08-09 NOTE — Progress Notes (Signed)
Subjective: He says he feels better. He still short of breath and weak. He is coughing a little bit but not bringing anything up. He has more drainage from his pleural catheter.  Objective: Vital signs in last 24 hours: Temp:  [97.7 F (36.5 C)-97.9 F (36.6 C)] 97.7 F (36.5 C) (08/10 0443) Pulse Rate:  [93-106] 93 (08/10 0443) Resp:  [20] 20 (08/10 0443) BP: (114-135)/(61-72) 114/71 mmHg (08/10 0443) SpO2:  [89 %-97 %] 94 % (08/10 0647) Weight:  [76 kg (167 lb 8.8 oz)] 76 kg (167 lb 8.8 oz) (08/10 0443) Weight change: 0.431 kg (15.2 oz) Last BM Date: 08/07/13  Intake/Output from previous day: 08/09 0701 - 08/10 0700 In: 358 [P.O.:240; I.V.:118] Out: 1700 [Urine:900; Chest Tube:800]  PHYSICAL EXAM General appearance: alert, cooperative and mild distress Resp: rhonchi bilaterally Cardio: regular rate and rhythm, S1, S2 normal, no murmur, click, rub or gallop GI: soft, non-tender; bowel sounds normal; no masses,  no organomegaly Extremities: extremities normal, atraumatic, no cyanosis or edema  Lab Results:    Basic Metabolic Panel:  Recent Labs  16/10/96 0547 08/08/13 0555  NA 134* 131*  K 3.7 3.4*  CL 88* 87*  CO2 31 33*  GLUCOSE 381* 295*  BUN 104* 103*  CREATININE 2.27* 2.01*  CALCIUM 9.8 9.4   Liver Function Tests: No results found for this basename: AST, ALT, ALKPHOS, BILITOT, PROT, ALBUMIN,  in the last 72 hours No results found for this basename: LIPASE, AMYLASE,  in the last 72 hours No results found for this basename: AMMONIA,  in the last 72 hours CBC:  Recent Labs  08/07/13 0543 08/08/13 0530  WBC 15.5* 14.1*  NEUTROABS 14.2* 12.5*  HGB 10.8* 10.1*  HCT 35.2* 32.3*  MCV 87.3 86.4  PLT 301 214   Cardiac Enzymes: No results found for this basename: CKTOTAL, CKMB, CKMBINDEX, TROPONINI,  in the last 72 hours BNP: No results found for this basename: PROBNP,  in the last 72 hours D-Dimer: No results found for this basename: DDIMER,  in the  last 72 hours CBG:  Recent Labs  08/07/13 1621 08/07/13 2059 08/08/13 0802 08/08/13 1136 08/08/13 1651 08/08/13 2121  GLUCAP 175* 153* 345* 345* 232* 167*   Hemoglobin A1C: No results found for this basename: HGBA1C,  in the last 72 hours Fasting Lipid Panel: No results found for this basename: CHOL, HDL, LDLCALC, TRIG, CHOLHDL, LDLDIRECT,  in the last 72 hours Thyroid Function Tests: No results found for this basename: TSH, T4TOTAL, FREET4, T3FREE, THYROIDAB,  in the last 72 hours Anemia Panel: No results found for this basename: VITAMINB12, FOLATE, FERRITIN, TIBC, IRON, RETICCTPCT,  in the last 72 hours Coagulation: No results found for this basename: LABPROT, INR,  in the last 72 hours Urine Drug Screen: Drugs of Abuse  No results found for this basename: labopia, cocainscrnur, labbenz, amphetmu, thcu, labbarb    Alcohol Level: No results found for this basename: ETH,  in the last 72 hours Urinalysis: No results found for this basename: COLORURINE, APPERANCEUR, LABSPEC, PHURINE, GLUCOSEU, HGBUR, BILIRUBINUR, KETONESUR, PROTEINUR, UROBILINOGEN, NITRITE, LEUKOCYTESUR,  in the last 72 hours Misc. Labs:  ABGS No results found for this basename: PHART, PCO2, PO2ART, TCO2, HCO3,  in the last 72 hours CULTURES No results found for this or any previous visit (from the past 240 hour(s)). Studies/Results: No results found.  Medications:  Prior to Admission:  Prescriptions prior to admission  Medication Sig Dispense Refill  . aspirin 81 MG chewable tablet Chew 81  mg by mouth daily.      Marland Kitchen diltiazem (CARDIZEM CD) 240 MG 24 hr capsule Take 1 capsule (240 mg total) by mouth 2 (two) times daily.  60 capsule  12  . fluticasone (FLOVENT HFA) 110 MCG/ACT inhaler Inhale 1 puff into the lungs 2 (two) times daily.      . furosemide (LASIX) 40 MG tablet Take 2 tablets (80 mg total) by mouth 2 (two) times daily.  30 tablet  12  . glyBURIDE (DIABETA) 5 MG tablet Take 5 mg by mouth daily  with breakfast.      . insulin aspart (NOVOLOG) 100 UNIT/ML injection Inject 3-15 Units into the skin 3 (three) times daily with meals. Sliding scale as follows: 121-150=3 units 151-200=4 units 201-250=7 units 251-300=9 units 301-350=12 units 351-400=15 units      . insulin glargine (LANTUS) 100 UNIT/ML injection Inject 5 Units into the skin at bedtime.      Marland Kitchen ipratropium-albuterol (DUONEB) 0.5-2.5 (3) MG/3ML SOLN Take 3 mLs by nebulization 4 (four) times daily.      . magnesium hydroxide (MILK OF MAGNESIA) 400 MG/5ML suspension Take 30 mLs by mouth daily as needed for constipation.      . pantoprazole (PROTONIX) 40 MG tablet Take 1 tablet (40 mg total) by mouth 2 (two) times daily before a meal.  60 tablet  12  . predniSONE (DELTASONE) 10 MG tablet Take 5 mg by mouth daily with breakfast.      . sodium chloride (OCEAN) 0.65 % SOLN nasal spray Place 1 spray into the nose as needed for congestion.    0  . acetaminophen (TYLENOL) 500 MG tablet Take 1,000 mg by mouth every 6 (six) hours as needed. Pain       Scheduled: . albuterol  2.5 mg Nebulization Q4H  . aspirin  81 mg Oral Daily  . ceFEPime (MAXIPIME) IV  1 g Intravenous Q24H  . clotrimazole   Topical BID  . diltiazem  240 mg Oral BID  . feeding supplement  30 mL Oral BID BM  . ferrous fumarate  1 tablet Oral BID  . fluticasone  1 puff Inhalation BID  . furosemide  40 mg Intravenous Daily  . guaiFENesin  600 mg Oral BID  . insulin aspart  0-20 Units Subcutaneous TID WC  . insulin aspart  0-5 Units Subcutaneous QHS  . insulin aspart  8 Units Subcutaneous TID WC  . insulin glargine  20 Units Subcutaneous QHS  . ipratropium  0.5 mg Nebulization Q4H  . methylPREDNISolone (SOLU-MEDROL) injection  40 mg Intravenous Q6H  . multivitamin with minerals  1 tablet Oral Daily  . pantoprazole  40 mg Oral BID AC  . sertraline  50 mg Oral Daily  . sodium chloride  3 mL Intravenous Q12H  . sodium chloride  3 mL Intravenous Q12H  . vancomycin   1,000 mg Intravenous Q48H   Continuous:  ZOX:WRUEAV chloride, acetaminophen, acetaminophen, albuterol, bisacodyl, fentaNYL, midazolam, ondansetron (ZOFRAN) IV, ondansetron, sodium chloride, sodium chloride  Assesment: He was admitted with acute on chronic respiratory failure with combined CHF healthcare associated pneumonia and pleural effusion. He now has a indwelling catheter to drain the pleural effusion and that seems to have helped. However he has increasing drainage over the last 24 hours. I had discontinued Lasix because his renal function was worse but he probably needs to be back on some Lasix because I think the increase in pleural effusion is related to increasing CHF. He has chronic kidney disease  which is doing okay. His blood sugar is fairly good. Active Problems:   Atrial flutter   COPD (chronic obstructive pulmonary disease)   Hypertension   CKD (chronic kidney disease) stage 3, GFR 30-59 ml/min   Type 2 diabetes mellitus, uncontrolled   Chronic diastolic congestive heart failure   Acute-on-chronic respiratory failure   HCAP (healthcare-associated pneumonia)   Anemia, normocytic normochromic   Pleural effusion   PUD (peptic ulcer disease)    Plan: Restart Lasix at 40 mg daily. He will continue all his other treatments. He'll have laboratory work in the morning.    LOS: 11 days   Marta Bouie L 08/09/2013, 10:33 AM

## 2013-08-10 LAB — CBC WITH DIFFERENTIAL/PLATELET
Eosinophils Absolute: 0 10*3/uL (ref 0.0–0.7)
Eosinophils Relative: 0 % (ref 0–5)
HCT: 33.9 % — ABNORMAL LOW (ref 39.0–52.0)
Hemoglobin: 10.4 g/dL — ABNORMAL LOW (ref 13.0–17.0)
Lymphocytes Relative: 4 % — ABNORMAL LOW (ref 12–46)
Lymphs Abs: 0.6 10*3/uL — ABNORMAL LOW (ref 0.7–4.0)
MCH: 26.5 pg (ref 26.0–34.0)
MCV: 86.5 fL (ref 78.0–100.0)
Monocytes Absolute: 0.4 10*3/uL (ref 0.1–1.0)
Monocytes Relative: 3 % (ref 3–12)
RBC: 3.92 MIL/uL — ABNORMAL LOW (ref 4.22–5.81)

## 2013-08-10 LAB — GLUCOSE, CAPILLARY
Glucose-Capillary: 350 mg/dL — ABNORMAL HIGH (ref 70–99)
Glucose-Capillary: 248 mg/dL — ABNORMAL HIGH (ref 70–99)

## 2013-08-10 LAB — BASIC METABOLIC PANEL
CO2: 33 mEq/L — ABNORMAL HIGH (ref 19–32)
Calcium: 9.5 mg/dL (ref 8.4–10.5)
Chloride: 94 mEq/L — ABNORMAL LOW (ref 96–112)
Glucose, Bld: 227 mg/dL — ABNORMAL HIGH (ref 70–99)
Potassium: 3.1 mEq/L — ABNORMAL LOW (ref 3.5–5.1)
Sodium: 136 mEq/L (ref 135–145)

## 2013-08-10 MED ORDER — VANCOMYCIN HCL IN DEXTROSE 1-5 GM/200ML-% IV SOLN
INTRAVENOUS | Status: AC
  Filled 2013-08-10: qty 200

## 2013-08-10 MED ORDER — POTASSIUM CHLORIDE CRYS ER 20 MEQ PO TBCR
20.0000 meq | EXTENDED_RELEASE_TABLET | Freq: Two times a day (BID) | ORAL | Status: DC
  Administered 2013-08-10 – 2013-08-11 (×4): 20 meq via ORAL
  Filled 2013-08-10 (×2): qty 1
  Filled 2013-08-10: qty 2
  Filled 2013-08-10 (×2): qty 1

## 2013-08-10 MED ORDER — MAGNESIUM HYDROXIDE 400 MG/5ML PO SUSP
30.0000 mL | Freq: Every day | ORAL | Status: DC | PRN
  Administered 2013-08-11 – 2013-08-12 (×2): 30 mL via ORAL
  Filled 2013-08-10 (×2): qty 30

## 2013-08-10 MED ORDER — POTASSIUM CHLORIDE CRYS ER 20 MEQ PO TBCR
40.0000 meq | EXTENDED_RELEASE_TABLET | Freq: Once | ORAL | Status: AC
  Administered 2013-08-10: 40 meq via ORAL
  Filled 2013-08-10: qty 1

## 2013-08-10 NOTE — Progress Notes (Signed)
Inpatient Diabetes Program Recommendations  AACE/ADA: New Consensus Statement on Inpatient Glycemic Control (2013)  Target Ranges:  Prepandial:   less than 140 mg/dL      Peak postprandial:   less than 180 mg/dL (1-2 hours)      Critically ill patients:  140 - 180 mg/dL   Results for MACLEAN, FOISTER (MRN [id number]) as of 08/10/2013 09:29  Ref. Range 08/09/2013 11:48 08/09/2013 21:15 08/10/2013 08:08  Glucose-Capillary Latest Range: 70-99 mg/dL 119 (H) 147 (H) 829 (H)    Inpatient Diabetes Program Recommendations Insulin - Basal: Please consider increasing Lantus to 30 units QHS.   Note: Fasting blood glucose on 08/09/13 was 292 mg/dl and this morning fasting blood glucose was 275 mg/dl.  Over the past 24 hours blood glucose has ranged from 216-350 mg/dl.  Patient received a total Novolog 46 units of correction insulin along with Novolog 24 units of meal coverage.  Please consider increasing Lantus to 30 units QHS.  Will continue to follow.  Thanks, Orlando Penner, RN, MSN, CCRN Diabetes Coordinator Inpatient Diabetes Program [phone]

## 2013-08-10 NOTE — Progress Notes (Signed)
Physical Therapy Treatment Patient Details Name: [name] [name] MRN: [id number] DOB: 02-Apr-1936 Today's Date: 08/10/2013 Time: 0930-1006 PT Time Calculation (min): 36 min Charges:  therex X 1, there act X 1  PT Assessment / Plan / Recommendation     PT Comments   Pt very weak today with increased rest breaks needed and difficulty getting 02 sats above 90% during treatment.  Attempted standing from bedside with walker, however unable to weight bear through LE's due to extreme weakness.  Pt required max assistance to pivot transfer to chair.  Able to get O2 sats to 91% after 5 minutes sitting in chair.  Pt requires vc's for proper breathing techniques during treatment.                        Plan Current plan remains appropriate    Precautions / Restrictions Precautions Precautions: Fall       Mobility  Bed Mobility Bed Mobility: Supine to Sit Supine to Sit: 4: Min assist;HOB elevated Details for Bed Mobility Assistance: O2 sats remained 88-92% during activity Transfers Transfers: Stand Pivot Transfers Stand Pivot Transfers: 2: Max assist Details for Transfer Assistance: very weak today; unable to stand without max assist; O2 dropped to 83% with transfer.    Exercises General Exercises - Lower Extremity Ankle Circles/Pumps: AROM;Both;10 reps;Supine Quad Sets: AROM;Both;10 reps;Supine Gluteal Sets: AROM;Both;10 reps;Supine Short Arc Quad: AROM;Both;10 reps;Supine Heel Slides: AROM;Both;10 reps;Supine Hip ABduction/ADduction: AROM;Both;10 reps;Supine    PT Goals (current goals can now be found in the care plan section)    Visit Information  Last PT Received On: 08/10/13    Subjective Data   Pt states he is so weak today; reports he did not get out of bed over the weekend.  No pain reported just difficulty catching breath.   Cognition  Cognition Arousal/Alertness: Awake/alert Behavior During Therapy: WFL for tasks assessed/performed Overall Cognitive Status:  Within Functional Limits for tasks assessed       End of Session PT - End of Session Equipment Utilized During Treatment: Gait belt;Oxygen Activity Tolerance: Patient limited by fatigue Patient left: in chair;with call bell/phone within reach;with family/visitor present     Lurena Nida, PTA/CLT 08/10/2013, 10:04 AM

## 2013-08-11 ENCOUNTER — Inpatient Hospital Stay (HOSPITAL_COMMUNITY): Payer: Medicare Other

## 2013-08-11 ENCOUNTER — Ambulatory Visit: Payer: Medicare Other | Admitting: Internal Medicine

## 2013-08-11 LAB — BASIC METABOLIC PANEL
BUN: 103 mg/dL — ABNORMAL HIGH (ref 6–23)
CO2: 32 mEq/L (ref 19–32)
Calcium: 9.6 mg/dL (ref 8.4–10.5)
Creatinine, Ser: 1.68 mg/dL — ABNORMAL HIGH (ref 0.50–1.35)
GFR calc Af Amer: 44 mL/min — ABNORMAL LOW (ref >90)

## 2013-08-11 LAB — GLUCOSE, CAPILLARY
Glucose-Capillary: 356 mg/dL — ABNORMAL HIGH (ref 70–99)
Glucose-Capillary: 280 mg/dL — ABNORMAL HIGH (ref 70–99)

## 2013-08-11 MED ORDER — METHYLPREDNISOLONE SODIUM SUCC 40 MG IJ SOLR
40.0000 mg | Freq: Two times a day (BID) | INTRAMUSCULAR | Status: DC
  Administered 2013-08-11 – 2013-08-13 (×5): 40 mg via INTRAVENOUS
  Filled 2013-08-11 (×5): qty 1

## 2013-08-11 MED ORDER — SODIUM CHLORIDE 0.9 % IV SOLN
1.5000 g | Freq: Four times a day (QID) | INTRAVENOUS | Status: DC
  Administered 2013-08-11 – 2013-08-14 (×12): 1.5 g via INTRAVENOUS
  Filled 2013-08-11 (×13): qty 1.5

## 2013-08-11 MED ORDER — FUROSEMIDE 10 MG/ML IJ SOLN
80.0000 mg | Freq: Once | INTRAMUSCULAR | Status: AC
  Administered 2013-08-11: 80 mg via INTRAVENOUS
  Filled 2013-08-11: qty 8

## 2013-08-11 MED ORDER — ZOLPIDEM TARTRATE 5 MG PO TABS
5.0000 mg | ORAL_TABLET | Freq: Every evening | ORAL | Status: DC | PRN
  Administered 2013-08-11: 5 mg via ORAL
  Filled 2013-08-11: qty 1

## 2013-08-11 MED ORDER — MORPHINE SULFATE 2 MG/ML IJ SOLN
1.0000 mg | INTRAMUSCULAR | Status: DC | PRN
  Administered 2013-08-11 – 2013-08-14 (×3): 1 mg via INTRAVENOUS
  Filled 2013-08-11 (×2): qty 1

## 2013-08-11 MED ORDER — ALPRAZOLAM 0.25 MG PO TABS
0.2500 mg | ORAL_TABLET | Freq: Three times a day (TID) | ORAL | Status: DC | PRN
  Administered 2013-08-11: 0.25 mg via ORAL
  Filled 2013-08-11 (×2): qty 1

## 2013-08-11 MED ORDER — INSULIN GLARGINE 100 UNIT/ML ~~LOC~~ SOLN
25.0000 [IU] | Freq: Every day | SUBCUTANEOUS | Status: DC
  Administered 2013-08-11 – 2013-08-12 (×2): 25 [IU] via SUBCUTANEOUS
  Filled 2013-08-11 (×3): qty 0.25

## 2013-08-11 NOTE — Clinical Social Work Psychosocial (Signed)
    Clinical Social Work Department BRIEF PSYCHOSOCIAL ASSESSMENT 08/11/2013  Patient:  [name], [name]     Account Number:  [id number]     Admit date:  07/12/2013  Clinical Social Worker:  Santa Genera, CLINICAL SOCIAL WORKER  Date/Time:  08/11/2013 11:00 AM  Referred by:  Physician  Date Referred:  08/11/2013 Referred for  SNF Placement   Other Referral:   Interview type:  Patient Other interview type:   Daughter in room, participating w patient consent    PSYCHOSOCIAL DATA Living Status:  WIFE Admitted from facility:   Level of care:   Primary support name:  [name] [name] Primary support relationship to patient:  SPOUSE Degree of support available:   Wife and daughter emotionally supportive and engaged    CURRENT CONCERNS Current Concerns  Post-Acute Placement   Other Concerns:    SOCIAL WORK ASSESSMENT / PLAN CSW met w patient at bedside, patient alert and oriented x4.  Patient appears somewhat sad, says that he is discouraged about lack of physical improvement.  Lives at home w wife who is in good health, daughter supportive and lives nearby.    Daughter reports patient has been "in and out of the hospital since Easter."  Patient and daughter understand that PT and MD have recommended SNF placement for rehab, prefer [facility].  CSW advised that they will need to have second choice in case beds are not available when patient ready for discharge.  Patient understands need for insurance to approve placement.  Daughter says she will discuss alternative choice w wife.    CSW initiated search for SNF beds and will keep patient informed.   Assessment/plan status:  Psychosocial Support/Ongoing Assessment of Needs Other assessment/ plan:   Information/referral to community resources:   SNF list    PATIENT'S/FAMILY'S RESPONSE TO PLAN OF CARE: Family prefers [facility], aware that they may not have bed available at discharge and will need to make second choice. Also aware that [facility] will need to approve.        Santa Genera, LCSW Clinical Social Worker [phone])

## 2013-08-11 NOTE — Clinical Social Work Note (Signed)
Documents faxed via Providerlink to [facility] for their review, left VM for Molly Maduro, reviewer, to let him know SNF rehab has been requested.  Santa Genera, LCSW Clinical Social Worker [phone])

## 2013-08-11 NOTE — Progress Notes (Signed)
Subjective: He is worse this morning. He apparently had an episode of hypoxia last night. He is much more congested. I think he may be aspirating chronically and acutely. This would explain why he has not improved despite 10+ days of IV antibiotics. I don't think the heart failure is a big issue now.  Objective: Vital signs in last 24 hours: Temp:  [97.4 F (36.3 C)-97.7 F (36.5 C)] 97.4 F (36.3 C) (08/12 0624) Pulse Rate:  [100-106] 106 (08/12 0624) Resp:  [20] 20 (08/12 0624) BP: (116-138)/(68-72) 138/72 mmHg (08/12 0624) SpO2:  [89 %-97 %] 90 % (08/12 0655) FiO2 (%):  [50 %] 50 % (08/12 0434) Weight:  [75.615 kg (166 lb 11.2 oz)] 75.615 kg (166 lb 11.2 oz) (08/12 0624) Weight change: 0.715 kg (1 lb 9.2 oz) Last BM Date: 08/09/13  Intake/Output from previous day: 08/11 0701 - 08/12 0700 In: 600 [P.O.:600] Out: 1575 [Urine:1575]  PHYSICAL EXAM General appearance: alert, cooperative, no distress and Chronically ill Resp: rhonchi bilaterally Cardio: regular rate and rhythm, S1, S2 normal, no murmur, click, rub or gallop GI: soft, non-tender; bowel sounds normal; no masses,  no organomegaly Extremities: extremities normal, atraumatic, no cyanosis or edema  Lab Results:    Basic Metabolic Panel:  Recent Labs  96/04/54 0513 08/11/13 0601  NA 136 136  K 3.1* 4.2  CL 94* 96  CO2 33* 32  GLUCOSE 227* 256*  BUN 102* 103*  CREATININE 1.73* 1.68*  CALCIUM 9.5 9.6   Liver Function Tests: No results found for this basename: AST, ALT, ALKPHOS, BILITOT, PROT, ALBUMIN,  in the last 72 hours No results found for this basename: LIPASE, AMYLASE,  in the last 72 hours No results found for this basename: AMMONIA,  in the last 72 hours CBC:  Recent Labs  08/10/13 0513  WBC 13.1*  NEUTROABS 12.1*  HGB 10.4*  HCT 33.9*  MCV 86.5  PLT 229   Cardiac Enzymes: No results found for this basename: CKTOTAL, CKMB, CKMBINDEX, TROPONINI,  in the last 72 hours BNP: No results  found for this basename: PROBNP,  in the last 72 hours D-Dimer: No results found for this basename: DDIMER,  in the last 72 hours CBG:  Recent Labs  08/09/13 2115 08/10/13 0808 08/10/13 1156 08/10/13 1630 08/10/13 2031 08/11/13 0725  GLUCAP 216* 275* 350* 314* 248* 278*   Hemoglobin A1C: No results found for this basename: HGBA1C,  in the last 72 hours Fasting Lipid Panel: No results found for this basename: CHOL, HDL, LDLCALC, TRIG, CHOLHDL, LDLDIRECT,  in the last 72 hours Thyroid Function Tests: No results found for this basename: TSH, T4TOTAL, FREET4, T3FREE, THYROIDAB,  in the last 72 hours Anemia Panel: No results found for this basename: VITAMINB12, FOLATE, FERRITIN, TIBC, IRON, RETICCTPCT,  in the last 72 hours Coagulation: No results found for this basename: LABPROT, INR,  in the last 72 hours Urine Drug Screen: Drugs of Abuse  No results found for this basename: labopia, cocainscrnur, labbenz, amphetmu, thcu, labbarb    Alcohol Level: No results found for this basename: ETH,  in the last 72 hours Urinalysis: No results found for this basename: COLORURINE, APPERANCEUR, LABSPEC, PHURINE, GLUCOSEU, HGBUR, BILIRUBINUR, KETONESUR, PROTEINUR, UROBILINOGEN, NITRITE, LEUKOCYTESUR,  in the last 72 hours Misc. Labs:  ABGS No results found for this basename: PHART, PCO2, PO2ART, TCO2, HCO3,  in the last 72 hours CULTURES No results found for this or any previous visit (from the past 240 hour(s)). Studies/Results: Dg Chest Port 1  View  08/11/2013   *RADIOLOGY REPORT*  Clinical Data: Pleural effusion, congestion  PORTABLE CHEST - 1 VIEW  Comparison: 08/06/2013  Findings: Multifocal interstitial/airspace opacities bilaterally, including more focal opacity in the lateral right upper lobe, suspicious for multifocal pneumonia.  Underlying interstitial edema is possible.  Small left pleural effusion, unchanged.  No pneumothorax.  Stable left chest tube/drain.  The heart is top normal  in size.  Median sternotomy.  IMPRESSION: Multifocal interstitial/airspace opacities, including more focal right upper lobe opacity, suspicious for multifocal pneumonia.  Underlying interstitial edema is possible.  Small left pleural effusion with indwelling left chest tube/drain.   Original Report Authenticated By: Charline Bills, M.D.    Medications:  Prior to Admission:  Prescriptions prior to admission  Medication Sig Dispense Refill  . aspirin 81 MG chewable tablet Chew 81 mg by mouth daily.      Marland Kitchen diltiazem (CARDIZEM CD) 240 MG 24 hr capsule Take 1 capsule (240 mg total) by mouth 2 (two) times daily.  60 capsule  12  . fluticasone (FLOVENT HFA) 110 MCG/ACT inhaler Inhale 1 puff into the lungs 2 (two) times daily.      . furosemide (LASIX) 40 MG tablet Take 2 tablets (80 mg total) by mouth 2 (two) times daily.  30 tablet  12  . glyBURIDE (DIABETA) 5 MG tablet Take 5 mg by mouth daily with breakfast.      . insulin aspart (NOVOLOG) 100 UNIT/ML injection Inject 3-15 Units into the skin 3 (three) times daily with meals. Sliding scale as follows: 121-150=3 units 151-200=4 units 201-250=7 units 251-300=9 units 301-350=12 units 351-400=15 units      . insulin glargine (LANTUS) 100 UNIT/ML injection Inject 5 Units into the skin at bedtime.      Marland Kitchen ipratropium-albuterol (DUONEB) 0.5-2.5 (3) MG/3ML SOLN Take 3 mLs by nebulization 4 (four) times daily.      . magnesium hydroxide (MILK OF MAGNESIA) 400 MG/5ML suspension Take 30 mLs by mouth daily as needed for constipation.      . pantoprazole (PROTONIX) 40 MG tablet Take 1 tablet (40 mg total) by mouth 2 (two) times daily before a meal.  60 tablet  12  . predniSONE (DELTASONE) 10 MG tablet Take 5 mg by mouth daily with breakfast.      . sodium chloride (OCEAN) 0.65 % SOLN nasal spray Place 1 spray into the nose as needed for congestion.    0  . acetaminophen (TYLENOL) 500 MG tablet Take 1,000 mg by mouth every 6 (six) hours as needed. Pain        Scheduled: . albuterol  2.5 mg Nebulization Q4H  . aspirin  81 mg Oral Daily  . ceFEPime (MAXIPIME) IV  1 g Intravenous Q24H  . clotrimazole   Topical BID  . diltiazem  240 mg Oral BID  . feeding supplement  30 mL Oral BID BM  . ferrous fumarate  1 tablet Oral BID  . fluticasone  1 puff Inhalation BID  . furosemide  40 mg Intravenous Daily  . guaiFENesin  600 mg Oral BID  . insulin aspart  0-20 Units Subcutaneous TID WC  . insulin aspart  0-5 Units Subcutaneous QHS  . insulin aspart  8 Units Subcutaneous TID WC  . insulin glargine  25 Units Subcutaneous QHS  . ipratropium  0.5 mg Nebulization Q4H  . methylPREDNISolone (SOLU-MEDROL) injection  40 mg Intravenous Q12H  . multivitamin with minerals  1 tablet Oral Daily  . pantoprazole  40 mg Oral  BID AC  . potassium chloride  20 mEq Oral BID  . sertraline  50 mg Oral Daily  . sodium chloride  3 mL Intravenous Q12H  . sodium chloride  3 mL Intravenous Q12H  . vancomycin  1,000 mg Intravenous Q48H   Continuous:  ZOX:WRUEAV chloride, acetaminophen, acetaminophen, albuterol, ALPRAZolam, bisacodyl, fentaNYL, magnesium hydroxide, midazolam, ondansetron (ZOFRAN) IV, ondansetron, sodium chloride, sodium chloride  Assesment: He is worse today. He was admitted with healthcare associated pneumonia. He has acute on chronic respiratory failure from that. He does have chronic diastolic congestive heart failure but I think that is pretty well controlled. He has chronic kidney disease and his renal function is about at baseline although his BUN is somewhat higher. He has severe COPD. He has hypertension which is stable. He has a pleural effusion and has a catheter in place and that looks much better. Active Problems:   Atrial flutter   COPD (chronic obstructive pulmonary disease)   Hypertension   CKD (chronic kidney disease) stage 3, GFR 30-59 ml/min   Type 2 diabetes mellitus, uncontrolled   Chronic diastolic congestive heart failure    Acute-on-chronic respiratory failure   HCAP (healthcare-associated pneumonia)   Anemia, normocytic normochromic   Pleural effusion   PUD (peptic ulcer disease)    Plan: I don't think he's going to be able to go directly home. I will discuss with infectious disease about changing his antibiotics. I'm going to reduce his steroids. I will increase Lantus. He will have Xanax as needed. He will have speech evaluation for aspiration    LOS: 13 days   Giuliana Handyside L 08/11/2013, 8:43 AM

## 2013-08-11 NOTE — Progress Notes (Signed)
SPEECH PATHOLOGY  Consult received for swallow evaluation. Pt and his daughter were interviewed and pt given a small amount to eat. Given pt's history of frequent hospitalizations for PNA and current decreased respiratory support, recommend MBSS to best determine safest diet and strategies if indicated. Pt and daughter in agreement with plan.  Thank you,  Havery Moros, CCC-SLP [phone]

## 2013-08-11 NOTE — Clinical Social Work Placement (Signed)
    Clinical Social Work Department CLINICAL SOCIAL WORK PLACEMENT NOTE 08/11/2013  Patient:  [name] [name], [name] [name]  Account Number:  [id number] Admit date:  07/28/2013  Clinical Social Worker:  Santa Genera, CLINICAL SOCIAL WORKER  Date/time:  08/11/2013 11:30 AM  Clinical Social Work is seeking post-discharge placement for this patient at the following level of care:   SKILLED NURSING   (*CSW will update this form in Epic as items are completed)   08/11/2013  Patient/family provided with [facility] Department of Clinical Social Work's list of facilities offering this level of care within the geographic area requested by the patient (or if unable, by the patient's family).  08/11/2013  Patient/family informed of their freedom to choose among providers that offer the needed level of care, that participate in Medicare, Medicaid or managed care program needed by the patient, have an available bed and are willing to accept the patient.  08/11/2013  Patient/family informed of [facility]' ownership interest in [facility], as well as of the fact that they are under no obligation to receive care at this facility.  PASARR submitted to [employer]DS on 08/11/2013 PASARR number received from [employer] on 08/11/2013  FL2 transmitted to all facilities in geographic area requested by pt/family on  08/11/2013 FL2 transmitted to all facilities within larger geographic area on   Patient informed that his/her managed care company has contracts with or will negotiate with  certain facilities, including the following:     Patient/family informed of bed offers received:   Patient chooses bed at  Physician recommends and patient chooses bed at    Patient to be transferred to  on   Patient to be transferred to facility by   The following physician request were entered in Epic:   Additional Comments:  Santa Genera, LCSW Clinical Social Worker [phone])

## 2013-08-11 NOTE — Progress Notes (Signed)
Physical Therapy Treatment Patient Details Name: [name] [name] MRN: [id number] DOB: 1936/12/12 Today's Date: 08/11/2013 Time: 4098-1191 PT Time Calculation (min): 27 min  PT Assessment / Plan / Recommendation  History of Present Illness     PT Comments   Pt appears to be very sad this morning because of lack of medical improvement.Marland KitchenMarland Kitchen"Dr [name] says that I will have to go back to the [facility]".  He c/o general malaise and difficulty breathing.  He is on nasal cannula with 4 L O2 and O2 sats in 90-95%.  We initiated very gentle ROM exercise but he was only able to do a few of them before stating that he was just couldn't breath well enough.    Follow Up Recommendations  SNF     Does the patient have the potential to tolerate intense rehabilitation     Barriers to Discharge        Equipment Recommendations       Recommendations for Other Services    Frequency     Progress towards PT Goals Progress towards PT goals: Not progressing toward goals - comment  Plan Discharge plan needs to be updated    Precautions / Restrictions     Pertinent Vitals/Pain     Mobility  Bed Mobility Bed Mobility: Not assessed Details for Bed Mobility Assistance: pt states that he is just too weak to try to get OOB...also feels that he cannot breathe, even though O2 sats are in the low 90s and HR is 100-110    Exercises General Exercises - Lower Extremity Ankle Circles/Pumps: AROM;Both;10 reps;Supine Quad Sets: AROM;Both;10 reps;Supine Short Arc Quad: AROM;Both;10 reps;Supine Hip ABduction/ADduction: AAROM;Left;10 reps;Supine   PT Diagnosis:    PT Problem List:   PT Treatment Interventions:     PT Goals (current goals can now be found in the care plan section) Acute Rehab PT Goals Time For Goal Achievement: 08/25/13 Potential to Achieve Goals: Fair  Visit Information  Last PT Received On: 08/11/13    Subjective Data      Cognition       Balance     End of Session PT - End of  Session Equipment Utilized During Treatment: Oxygen Activity Tolerance: Patient limited by fatigue Patient left: in bed;with call bell/phone within reach   GP     Myrlene Broker L 08/11/2013, 10:02 AM

## 2013-08-11 NOTE — Procedures (Signed)
Objective Swallowing Evaluation: Modified Barium Swallowing Study   Patient Details  Name: [name] [name] MRN: [id number] Date of Birth: 17-Jan-1936  Today's Date: 08/11/2013 Time: 1200-1240 SLP Time Calculation (min): 40 min  Past Medical History:  Past Medical History  Diagnosis Date  . COPD (chronic obstructive pulmonary disease)   . AF (atrial fibrillation)   . Hypertension   . Gout   . Arthritis   . Small bowel obstruction   . Renal insufficiency   . Aortic stenosis   . Hydropneumothorax   . Diabetes mellitus   . CHF (congestive heart failure)   . On home O2     qhs prn  . Pneumonia 05/04/2013    history of pneumonia  . Pneumonia 10/12  . Hydropneumothorax 2012   Past Surgical History:  Past Surgical History  Procedure Laterality Date  . Rotator cuff repair      left and right  . Cardiac valve replacement      aortic valve with a tissue prosthesis and left sided maze proc  . S/p rewiring of sternum for dehiscence    . Gallbladder surgery    . US echocardiography  01-03-11    EF 55-60%  . Cardiovascular stress test  01-26-09    EF 67%  . Coronary angioplasty    . Cholecystectomy    . Sternal incision reclosure  08/29/2010    sternal rewiring  . Cataract extraction w/phaco  10/02/2012    Procedure: CATARACT EXTRACTION PHACO AND INTRAOCULAR LENS PLACEMENT (IOC);  Surgeon: Gemma Payor, MD;  Location: [facility];  Service: Ophthalmology;  Laterality: Right;  CDE 16.68  . Cataract extraction w/phaco  10/27/2012    Procedure: CATARACT EXTRACTION PHACO AND INTRAOCULAR LENS PLACEMENT (IOC);  Surgeon: Gemma Payor, MD;  Location: [facility];  Service: Ophthalmology;  Laterality: Left;  CDE:16.89  . Artificial aortic valve    . Colonoscopy N/A 06/19/2013    Dr. Jennell Corner internal hemorrhoids, diverticulosis  . Esophagogastroduodenoscopy N/A 06/19/2013    Dr. Yong Channel stricture passed by scope, multiple small gastric ulcers with evidence of old blood in the stomach,  gastritis. Biopsy was negative for H. pylori. There was Candida species and possible colonization of the ulcer.   HPI:  [name] [name] is a 77 y.o. male with multiple medical problems including chronic respiratory failure on home oxygen, COPD, congestive heart failure, atrial flutter not on Coumadin due to history of bleeding. Patient was recently in the hospital when he was treated for shortness of breath and underwent thoracentesis with removal of pleural fluid. His daughter reports that he has been in and out of the hospital with PNA.He had EGD done in June 2014 which showed esophageal stricture that was passed by scope.     Assessment / Plan / Recommendation Clinical Impression  Dysphagia Diagnosis: Within Functional Limits;Mild pharyngeal phase dysphagia Clinical impression: Mild pharyngeal phase dysphagia in setting of COPD/acute on chronic respiratory failure characterized by delay in swallow initiation and mildly decreased eptiglottic deflection resulting in variable flash penetration of thins and nectars during the swallow, one episode of trace aspiration of nectars after the swallow (delayed from residuals), and mild lateral channels and pyriform residue. PO administration was difficult due to pt needing oxygen via mask during po (became more SOB when mask removed briefly for bites/sips). Pt had increased residuals from base of tongue into valleculae with nectars to which he did not appear sensate. He had a single episode of trace aspiration of nectar residuals and again did not  feel. Pt was cued to cough and he cleared the aspirate. I do not feel that his swallow function strongly correlates with his overall clinical picture of suspected chronic aspiration, however it is possible. Certainly, his decreased respiratory support and history of COPD increases risk of aspiration. Daughter states that pt sits upright at the table  at home, seems to do ok with liquids, but has difficulty with meats and  breads. Esophageal clearance appeared adequate today with the exception of the barium tablet being briefly delayed near GE junction (it passed with continued sips nectar). Recommend: Dysphagia 3 (for energy conservation) and thin liquids via small sips with throat clear and repeat swallow. Pt instructed to swallow 2x with each bite/sip. Recommendations will be posted at [facility] and reviewed  again with pt/family.    Treatment Recommendation  Therapy as outlined in treatment plan below    Diet Recommendation Dysphagia 3 (Mechanical Soft);Thin liquid   Liquid Administration via: Cup Medication Administration: Whole meds with liquid (or break large pills in puree) Supervision: Patient able to self feed;Full supervision/cueing for compensatory strategies Compensations: Slow rate;Small sips/bites;Multiple dry swallows after each bite/sip;Clear throat intermittently Postural Changes and/or Swallow Maneuvers: Seated upright 90 degrees;Upright 30-60 min after meal    Other  Recommendations Oral Care Recommendations: Oral care BID Other Recommendations: Clarify dietary restrictions   Follow Up Recommendations  Skilled Nursing facility    Frequency and Duration min 2x/week  1 week       SLP Swallow Goals Patient will consume recommended diet without observed clinical signs of aspiration with: Minimal assistance Patient will utilize recommended strategies during swallow to increase swallowing safety with: Minimal assistance   General Date of Onset: 07/08/2013 HPI: [name] [name] is a 77 y.o. male with multiple medical problems including chronic respiratory failure on home oxygen, COPD, congestive heart failure, atrial flutter not on Coumadin due to history of bleeding. Patient was recently in the hospital when he was treated for shortness of breath and underwent thoracentesis with removal of pleural fluid. His daughter reports that he has been in and out of the hospital with PNA.He had EGD done in June  2014 which showed esophageal stricture that was passed by scope. Type of Study: Modified Barium Swallowing Study Reason for Referral: Objectively evaluate swallowing function Previous Swallow Assessment: None on record Diet Prior to this Study: Regular;Thin liquids Temperature Spikes Noted: No Respiratory Status: Supplemental O2 delivered via (comment) (15L face mask) History of Recent Intubation: No Behavior/Cognition: Alert;Cooperative;Pleasant mood Oral Cavity - Dentition: Dentures, top;Dentures, bottom Oral Motor / Sensory Function: Within functional limits Self-Feeding Abilities: Able to feed self Patient Positioning: Upright in chair Baseline Vocal Quality: Wet;Low vocal intensity Volitional Cough: Strong;Congested Volitional Swallow: Able to elicit Anatomy: Within functional limits Pharyngeal Secretions: Not observed secondary MBS    Reason for Referral Objectively evaluate swallowing function   Oral Phase Oral Preparation/Oral Phase Oral Phase: WFL   Pharyngeal Phase Pharyngeal Phase Pharyngeal Phase: Impaired Pharyngeal - Nectar Pharyngeal - Nectar Straw: Delayed swallow initiation;Premature spillage to valleculae;Premature spillage to pyriform sinuses;Reduced tongue base retraction;Penetration/Aspiration during swallow;Penetration/Aspiration after swallow;Trace aspiration;Pharyngeal residue - valleculae;Pharyngeal residue - pyriform sinuses;Lateral channel residue Penetration/Aspiration details (nectar straw): Material does not enter airway;Material enters airway, remains ABOVE vocal cords then ejected out;Material enters airway, passes BELOW cords without attempt by patient to eject out (silent aspiration);Material enters airway, passes BELOW cords then ejected out (trace silent aspir of nectars after swall, c'd cough clears) Pharyngeal - Thin Pharyngeal - Thin Cup: Delayed swallow initiation;Premature  spillage to valleculae;Premature spillage to pyriform  sinuses;Penetration/Aspiration during swallow;Pharyngeal residue - pyriform sinuses Penetration/Aspiration details (thin cup): Material does not enter airway;Material enters airway, remains ABOVE vocal cords then ejected out Pharyngeal - Thin Straw: Delayed swallow initiation;Premature spillage to valleculae;Premature spillage to pyriform sinuses;Penetration/Aspiration during swallow;Pharyngeal residue - pyriform sinuses Penetration/Aspiration details (thin straw): Material does not enter airway;Material enters airway, remains ABOVE vocal cords then ejected out Pharyngeal - Solids Pharyngeal - Puree: Within functional limits Pharyngeal - Regular: Within functional limits Pharyngeal - Pill: Within functional limits  Cervical Esophageal Phase    GO    Cervical Esophageal Phase Cervical Esophageal Phase: [facility]        Thank you,  Havery Moros, CCC-SLP [phone]  Annesha Delgreco 08/11/2013, 2:09 PM

## 2013-08-12 LAB — GLUCOSE, CAPILLARY
Glucose-Capillary: 148 mg/dL — ABNORMAL HIGH (ref 70–99)
Glucose-Capillary: 103 mg/dL — ABNORMAL HIGH (ref 70–99)
Glucose-Capillary: 107 mg/dL — ABNORMAL HIGH (ref 70–99)

## 2013-08-12 LAB — BASIC METABOLIC PANEL
BUN: 100 mg/dL — ABNORMAL HIGH (ref 6–23)
CO2: 35 mEq/L — ABNORMAL HIGH (ref 19–32)
Calcium: 9.7 mg/dL (ref 8.4–10.5)
Creatinine, Ser: 1.64 mg/dL — ABNORMAL HIGH (ref 0.50–1.35)
Glucose, Bld: 164 mg/dL — ABNORMAL HIGH (ref 70–99)

## 2013-08-12 MED ORDER — LORAZEPAM 2 MG/ML IJ SOLN
0.5000 mg | INTRAMUSCULAR | Status: DC | PRN
  Administered 2013-08-12: 0.5 mg via INTRAVENOUS
  Filled 2013-08-12: qty 1

## 2013-08-12 MED ORDER — MORPHINE SULFATE 25 MG/ML IV SOLN
2.0000 mg/h | INTRAVENOUS | Status: DC
  Administered 2013-08-12: 2 mg/h via INTRAVENOUS
  Filled 2013-08-12: qty 10

## 2013-08-12 NOTE — Progress Notes (Signed)
Patient's wife checked patient's 02 saturations reading at 80%, when i got to room they were 87%, increased 02 to 50% venturi mask but had no change, put patient on a partial rebreather, 02 saturations reading 92-95% now, will titrate down as tolerated

## 2013-08-12 NOTE — Progress Notes (Signed)
PT Cancellation Note  Patient Details Name: [name] [name] MRN: [id number] DOB: May 12, 1936   Cancelled Treatment:     Treatment held today due to SOB unable to tolerate activity.   RUSSELL,CINDY 08/12/2013, 11:01 AM

## 2013-08-12 NOTE — Clinical Social Work Note (Signed)
Patient has bed offers from [facility], [facility] [place], [facility].  [facility] is not in network, [facility] has declined to offer bed.  Santa Genera, LCSW Clinical Social Worker [phone])

## 2013-08-12 NOTE — Progress Notes (Signed)
Subjective: He is much worse than yesterday. His oxygen saturation is still low and he is on a nonrebreather mask. He says he is very short of breath. He received some morphine last night which helped some. He had speech evaluation and although it was not totally normal he did not have overt signs of aspiration. He had pretty good response to Lasix as far as urine output but it has made no difference in his symptoms.  Objective: Vital signs in last 24 hours: Temp:  [97.6 F (36.4 C)-97.8 F (36.6 C)] 97.6 F (36.4 C) (08/13 0552) Pulse Rate:  [103-104] 104 (08/13 0552) Resp:  [20-24] 24 (08/13 0552) BP: (139-146)/(76-80) 146/79 mmHg (08/13 0552) SpO2:  [88 %-96 %] 90 % (08/13 0801) FiO2 (%):  [40 %-65 %] 65 % (08/13 0801) Weight:  [75.433 kg (166 lb 4.8 oz)] 75.433 kg (166 lb 4.8 oz) (08/13 0409) Weight change: -0.181 kg (-6.4 oz) Last BM Date: 08/09/13  Intake/Output from previous day: 08/12 0701 - 08/13 0700 In: 680 [P.O.:580; IV Piggyback:100] Out: 2775 [Urine:2775]  PHYSICAL EXAM General appearance: alert and severe distress Resp: rhonchi bilaterally Cardio: regular rate and rhythm, S1, S2 normal, no murmur, click, rub or gallop GI: soft, non-tender; bowel sounds normal; no masses,  no organomegaly Extremities: extremities normal, atraumatic, no cyanosis or edema  Lab Results:    Basic Metabolic Panel:  Recent Labs  16/10/96 0601 08/12/13 0519  NA 136 138  K 4.2 4.3  CL 96 96  CO2 32 35*  GLUCOSE 256* 164*  BUN 103* 100*  CREATININE 1.68* 1.64*  CALCIUM 9.6 9.7   Liver Function Tests: No results found for this basename: AST, ALT, ALKPHOS, BILITOT, PROT, ALBUMIN,  in the last 72 hours No results found for this basename: LIPASE, AMYLASE,  in the last 72 hours No results found for this basename: AMMONIA,  in the last 72 hours CBC:  Recent Labs  08/10/13 0513  WBC 13.1*  NEUTROABS 12.1*  HGB 10.4*  HCT 33.9*  MCV 86.5  PLT 229   Cardiac Enzymes: No  results found for this basename: CKTOTAL, CKMB, CKMBINDEX, TROPONINI,  in the last 72 hours BNP: No results found for this basename: PROBNP,  in the last 72 hours D-Dimer: No results found for this basename: DDIMER,  in the last 72 hours CBG:  Recent Labs  08/10/13 2031 08/11/13 0725 08/11/13 1154 08/11/13 1640 08/11/13 2128 08/12/13 0741  GLUCAP 248* 278* 280* 217* 177* 148*   Hemoglobin A1C: No results found for this basename: HGBA1C,  in the last 72 hours Fasting Lipid Panel: No results found for this basename: CHOL, HDL, LDLCALC, TRIG, CHOLHDL, LDLDIRECT,  in the last 72 hours Thyroid Function Tests: No results found for this basename: TSH, T4TOTAL, FREET4, T3FREE, THYROIDAB,  in the last 72 hours Anemia Panel: No results found for this basename: VITAMINB12, FOLATE, FERRITIN, TIBC, IRON, RETICCTPCT,  in the last 72 hours Coagulation: No results found for this basename: LABPROT, INR,  in the last 72 hours Urine Drug Screen: Drugs of Abuse  No results found for this basename: labopia, cocainscrnur, labbenz, amphetmu, thcu, labbarb    Alcohol Level: No results found for this basename: ETH,  in the last 72 hours Urinalysis: No results found for this basename: COLORURINE, APPERANCEUR, LABSPEC, PHURINE, GLUCOSEU, HGBUR, BILIRUBINUR, KETONESUR, PROTEINUR, UROBILINOGEN, NITRITE, LEUKOCYTESUR,  in the last 72 hours Misc. Labs:  ABGS No results found for this basename: PHART, PCO2, PO2ART, TCO2, HCO3,  in the last 72  hours CULTURES No results found for this or any previous visit (from the past 240 hour(s)). Studies/Results: Dg Chest Port 1 View  08/11/2013   *RADIOLOGY REPORT*  Clinical Data: Pleural effusion, congestion  PORTABLE CHEST - 1 VIEW  Comparison: 08/06/2013  Findings: Multifocal interstitial/airspace opacities bilaterally, including more focal opacity in the lateral right upper lobe, suspicious for multifocal pneumonia.  Underlying interstitial edema is possible.   Small left pleural effusion, unchanged.  No pneumothorax.  Stable left chest tube/drain.  The heart is top normal in size.  Median sternotomy.  IMPRESSION: Multifocal interstitial/airspace opacities, including more focal right upper lobe opacity, suspicious for multifocal pneumonia.  Underlying interstitial edema is possible.  Small left pleural effusion with indwelling left chest tube/drain.   Original Report Authenticated By: Charline Bills, M.D.   Dg Swallowing Func-speech Pathology  08/11/2013   Dorene Ar, CCC-SLP     08/11/2013  2:10 PM Objective Swallowing Evaluation: Modified Barium Swallowing Study    Patient Details  Name: XAIDYN KEPNER MRN: [id number] Date of Birth: 1972-09-05  Today's Date: 08/11/2013 Time: 1200-1240 SLP Time Calculation (min): 40 min  Past Medical History:  Past Medical History  Diagnosis Date  . COPD (chronic obstructive pulmonary disease)   . AF (atrial fibrillation)   . Hypertension   . Gout   . Arthritis   . Small bowel obstruction   . Renal insufficiency   . Aortic stenosis   . Hydropneumothorax   . Diabetes mellitus   . CHF (congestive heart failure)   . On home O2     qhs prn  . Pneumonia 05/04/2013    history of pneumonia  . Pneumonia 10/12  . Hydropneumothorax 2012   Past Surgical History:  Past Surgical History  Procedure Laterality Date  . Rotator cuff repair      left and right  . Cardiac valve replacement      aortic valve with a tissue prosthesis and left sided maze proc  . S/p rewiring of sternum for dehiscence    . Gallbladder surgery    . US echocardiography  01-03-11    EF 55-60%  . Cardiovascular stress test  01-26-09    EF 67%  . Coronary angioplasty    . Cholecystectomy    . Sternal incision reclosure  08/29/2010    sternal rewiring  . Cataract extraction w/phaco  10/02/2012    Procedure: CATARACT EXTRACTION PHACO AND INTRAOCULAR LENS  PLACEMENT (IOC);  Surgeon: Gemma Payor, MD;  Location: [facility];   Service: Ophthalmology;  Laterality: Right;  CDE 16.68  .  Cataract extraction w/phaco  10/27/2012    Procedure: CATARACT EXTRACTION PHACO AND INTRAOCULAR LENS  PLACEMENT (IOC);  Surgeon: Gemma Payor, MD;  Location: [facility];   Service: Ophthalmology;  Laterality: Left;  CDE:16.89  . Artificial aortic valve    . Colonoscopy N/A 06/19/2013    Dr. Jennell Corner internal hemorrhoids, diverticulosis  . Esophagogastroduodenoscopy N/A 06/19/2013    Dr. Yong Channel stricture passed by scope, multiple  small gastric ulcers with evidence of old blood in the stomach,  gastritis. Biopsy was negative for H. pylori. There was Candida  species and possible colonization of the ulcer.   HPI:  JUDAH CARCHI is a 77 y.o. male with multiple medical problems  including chronic respiratory failure on home oxygen, COPD,  congestive heart failure, atrial flutter not on Coumadin due to  history of bleeding. Patient was recently in the hospital when he  was treated for shortness of breath and  underwent thoracentesis  with removal of pleural fluid. His daughter reports that he has  been in and out of the hospital with PNA.He had EGD done in June  2014 which showed esophageal stricture that was passed by scope.     Assessment / Plan / Recommendation Clinical Impression  Dysphagia Diagnosis: Within Functional Limits;Mild pharyngeal  phase dysphagia Clinical impression: Mild pharyngeal phase dysphagia in setting  of COPD/acute on chronic respiratory failure characterized by  delay in swallow initiation and mildly decreased eptiglottic  deflection resulting in variable flash penetration of thins and  nectars during the swallow, one episode of trace aspiration of  nectars after the swallow (delayed from residuals), and mild  lateral channels and pyriform residue. PO administration was  difficult due to pt needing oxygen via mask during po (became  more SOB when mask removed briefly for bites/sips). Pt had  increased residuals from base of tongue into valleculae with  nectars to which he did not  appear sensate. He had a single  episode of trace aspiration of nectar residuals and again did not  feel. Pt was cued to cough and he cleared the aspirate. I do not  feel that his swallow function strongly correlates with his  overall clinical picture of suspected chronic aspiration, however  it is possible. Certainly, his decreased respiratory support and  history of COPD increases risk of aspiration. Daughter states  that pt sits upright at the table  at home, seems to do ok with  liquids, but has difficulty with meats and breads. Esophageal  clearance appeared adequate today with the exception of the  barium tablet being briefly delayed near GE junction (it passed  with continued sips nectar). Recommend: Dysphagia 3 (for energy  conservation) and thin liquids via small sips with throat clear  and repeat swallow. Pt instructed to swallow 2x with each  bite/sip. Recommendations will be posted at [facility] and reviewed   again with pt/family.    Treatment Recommendation  Therapy as outlined in treatment plan below    Diet Recommendation Dysphagia 3 (Mechanical Soft);Thin liquid   Liquid Administration via: Cup Medication Administration: Whole meds with liquid (or break large  pills in puree) Supervision: Patient able to self feed;Full supervision/cueing  for compensatory strategies Compensations: Slow rate;Small sips/bites;Multiple dry swallows  after each bite/sip;Clear throat intermittently Postural Changes and/or Swallow Maneuvers: Seated upright 90  degrees;Upright 30-60 min after meal    Other  Recommendations Oral Care Recommendations: Oral care BID Other Recommendations: Clarify dietary restrictions   Follow Up Recommendations  Skilled Nursing facility    Frequency and Duration min 2x/week  1 week       SLP Swallow Goals Patient will consume recommended diet without observed clinical  signs of aspiration with: Minimal assistance Patient will utilize recommended strategies during swallow to  increase swallowing  safety with: Minimal assistance   General Date of Onset: 08-03-2013 HPI: ALAN DRUMMER is a 77 y.o. male with multiple medical  problems including chronic respiratory failure on home oxygen,  COPD, congestive heart failure, atrial flutter not on Coumadin  due to history of bleeding. Patient was recently in the hospital  when he was treated for shortness of breath and underwent  thoracentesis with removal of pleural fluid. His daughter reports  that he has been in and out of the hospital with PNA.He had EGD  done in June 2014 which showed esophageal stricture that was  passed by scope. Type of Study: Modified Barium Swallowing Study Reason for  Referral: Objectively evaluate swallowing function Previous Swallow Assessment: None on record Diet Prior to this Study: Regular;Thin liquids Temperature Spikes Noted: No Respiratory Status: Supplemental O2 delivered via (comment) (15L  face mask) History of Recent Intubation: No Behavior/Cognition: Alert;Cooperative;Pleasant mood Oral Cavity - Dentition: Dentures, top;Dentures, bottom Oral Motor / Sensory Function: Within functional limits Self-Feeding Abilities: Able to feed self Patient Positioning: Upright in chair Baseline Vocal Quality: Wet;Low vocal intensity Volitional Cough: Strong;Congested Volitional Swallow: Able to elicit Anatomy: Within functional limits Pharyngeal Secretions: Not observed secondary MBS    Reason for Referral Objectively evaluate swallowing function   Oral Phase Oral Preparation/Oral Phase Oral Phase: WFL   Pharyngeal Phase Pharyngeal Phase Pharyngeal Phase: Impaired Pharyngeal - Nectar Pharyngeal - Nectar Straw: Delayed swallow initiation;Premature  spillage to valleculae;Premature spillage to pyriform  sinuses;Reduced tongue base retraction;Penetration/Aspiration  during swallow;Penetration/Aspiration after swallow;Trace  aspiration;Pharyngeal residue - valleculae;Pharyngeal residue -  pyriform sinuses;Lateral channel residue  Penetration/Aspiration details (nectar straw): Material does not  enter airway;Material enters airway, remains ABOVE vocal cords  then ejected out;Material enters airway, passes BELOW cords  without attempt by patient to eject out (silent  aspiration);Material enters airway, passes BELOW cords then  ejected out (trace silent aspir of nectars after swall, c'd cough  clears) Pharyngeal - Thin Pharyngeal - Thin Cup: Delayed swallow initiation;Premature  spillage to valleculae;Premature spillage to pyriform  sinuses;Penetration/Aspiration during swallow;Pharyngeal residue  - pyriform sinuses Penetration/Aspiration details (thin cup): Material does not  enter airway;Material enters airway, remains ABOVE vocal cords  then ejected out Pharyngeal - Thin Straw: Delayed swallow initiation;Premature  spillage to valleculae;Premature spillage to pyriform  sinuses;Penetration/Aspiration during swallow;Pharyngeal residue  - pyriform sinuses Penetration/Aspiration details (thin straw): Material does not  enter airway;Material enters airway, remains ABOVE vocal cords  then ejected out Pharyngeal - Solids Pharyngeal - Puree: Within functional limits Pharyngeal - Regular: Within functional limits Pharyngeal - Pill: Within functional limits  Cervical Esophageal Phase    GO    Cervical Esophageal Phase Cervical Esophageal Phase: [facility]        Thank you,  Havery Moros, CCC-SLP [phone]  PORTER,DABNEY 08/11/2013, 2:09 PM     Medications:  Prior to Admission:  Prescriptions prior to admission  Medication Sig Dispense Refill  . aspirin 81 MG chewable tablet Chew 81 mg by mouth daily.      Marland Kitchen diltiazem (CARDIZEM CD) 240 MG 24 hr capsule Take 1 capsule (240 mg total) by mouth 2 (two) times daily.  60 capsule  12  . fluticasone (FLOVENT HFA) 110 MCG/ACT inhaler Inhale 1 puff into the lungs 2 (two) times daily.      . furosemide (LASIX) 40 MG tablet Take 2 tablets (80 mg total) by mouth 2 (two) times daily.  30 tablet  12  .  glyBURIDE (DIABETA) 5 MG tablet Take 5 mg by mouth daily with breakfast.      . insulin aspart (NOVOLOG) 100 UNIT/ML injection Inject 3-15 Units into the skin 3 (three) times daily with meals. Sliding scale as follows: 121-150=3 units 151-200=4 units 201-250=7 units 251-300=9 units 301-350=12 units 351-400=15 units      . insulin glargine (LANTUS) 100 UNIT/ML injection Inject 5 Units into the skin at bedtime.      Marland Kitchen ipratropium-albuterol (DUONEB) 0.5-2.5 (3) MG/3ML SOLN Take 3 mLs by nebulization 4 (four) times daily.      . magnesium hydroxide (MILK OF MAGNESIA) 400 MG/5ML suspension Take 30 mLs by mouth daily as needed for constipation.      . pantoprazole (  PROTONIX) 40 MG tablet Take 1 tablet (40 mg total) by mouth 2 (two) times daily before a meal.  60 tablet  12  . predniSONE (DELTASONE) 10 MG tablet Take 5 mg by mouth daily with breakfast.      . sodium chloride (OCEAN) 0.65 % SOLN nasal spray Place 1 spray into the nose as needed for congestion.    0  . acetaminophen (TYLENOL) 500 MG tablet Take 1,000 mg by mouth every 6 (six) hours as needed. Pain       Scheduled: . albuterol  2.5 mg Nebulization Q4H  . ampicillin-sulbactam (UNASYN) IV  1.5 g Intravenous Q6H  . aspirin  81 mg Oral Daily  . clotrimazole   Topical BID  . diltiazem  240 mg Oral BID  . feeding supplement  30 mL Oral BID BM  . ferrous fumarate  1 tablet Oral BID  . fluticasone  1 puff Inhalation BID  . furosemide  40 mg Intravenous Daily  . guaiFENesin  600 mg Oral BID  . insulin aspart  0-20 Units Subcutaneous TID WC  . insulin aspart  0-5 Units Subcutaneous QHS  . insulin aspart  8 Units Subcutaneous TID WC  . insulin glargine  25 Units Subcutaneous QHS  . ipratropium  0.5 mg Nebulization Q4H  . methylPREDNISolone (SOLU-MEDROL) injection  40 mg Intravenous Q12H  . multivitamin with minerals  1 tablet Oral Daily  . pantoprazole  40 mg Oral BID AC  . potassium chloride  20 mEq Oral BID  . sertraline  50 mg Oral  Daily  . sodium chloride  3 mL Intravenous Q12H  . sodium chloride  3 mL Intravenous Q12H   Continuous:  ZOX:WRUEAV chloride, acetaminophen, acetaminophen, albuterol, ALPRAZolam, bisacodyl, fentaNYL, magnesium hydroxide, midazolam, morphine injection, ondansetron (ZOFRAN) IV, ondansetron, sodium chloride, sodium chloride, zolpidem  Assesment: He has worsening respiratory failure. I think he is dying. I discussed that with his family and explained to them that I think we need to make him comfortable. His wife agrees but his daughter had a seizure during the discussion. Active Problems:   Atrial flutter   COPD (chronic obstructive pulmonary disease)   Hypertension   CKD (chronic kidney disease) stage 3, GFR 30-59 ml/min   Type 2 diabetes mellitus, uncontrolled   Chronic diastolic congestive heart failure   Acute-on-chronic respiratory failure   HCAP (healthcare-associated pneumonia)   Anemia, normocytic normochromic   Pleural effusion   PUD (peptic ulcer disease)    Plan: I will start morphine continuous infusion    LOS: 14 days   Isaic Syler L 08/12/2013, 8:38 AM

## 2013-08-12 NOTE — Progress Notes (Signed)
Pt was still short of breath on a NRB and said he needed more air. RT placed Pt on a NRB and 6L [place] to see if he would feel like he had more air. The Pt is a DNR

## 2013-08-12 NOTE — Progress Notes (Signed)
Pt spo2 86% on partial rebreather and 6 lpm cann changed to 100% nrb and 6lpm cann will continue to monitor spo2 nurse informed of pt condition and procedure I did.

## 2013-08-12 NOTE — Progress Notes (Signed)
Patient changed code status to DNR today. Dr. Sudie Bailey notified that patient requests sleep aid and morphine for air hunger. Order given for 1 mg Morphine q 3h prn for air hunger and 5 mg Ambien for sleep. Patient resting well.

## 2013-08-12 NOTE — Progress Notes (Signed)
Speech Language Pathology Treatment Patient Details Name: [name] [name] MRN: [id number] DOB: 05-18-1936 Today's Date: 08/12/2013 Time:  9:00- 9:25    Assessment / Plan / Recommendation Clinical Impression  SLP f/u with pt today to attempt to assess diet tolerance. However, pt was having increased difficulty breathing today and was not appropriate for tx or oral intake. SLP spoke with nursing regarding his status.     SLP Plan    SLP to f/u on next day.   Pertinent Vitals/Pain     GO     [name] [name] S 08/12/2013, 9:57 AM

## 2013-08-13 LAB — GLUCOSE, CAPILLARY
Glucose-Capillary: 203 mg/dL — ABNORMAL HIGH (ref 70–99)
Glucose-Capillary: 184 mg/dL — ABNORMAL HIGH (ref 70–99)

## 2013-08-13 MED ORDER — SCOPOLAMINE 1 MG/3DAYS TD PT72
1.0000 | MEDICATED_PATCH | TRANSDERMAL | Status: DC
  Administered 2013-08-13: 1.5 mg via TRANSDERMAL
  Filled 2013-08-13: qty 1

## 2013-08-13 MED ORDER — SCOPOLAMINE 1 MG/3DAYS TD PT72
MEDICATED_PATCH | TRANSDERMAL | Status: AC
  Filled 2013-08-13: qty 1

## 2013-08-13 NOTE — Plan of Care (Signed)
Problem: Phase III Progression Outcomes Goal: Activity at appropriate level-compared to baseline (UP IN CHAIR FOR HEMODIALYSIS)  Outcome: Not Applicable Date Met:  08/13/13 Pt is comfort care, on bedrest, turns as requested by family.

## 2013-08-13 NOTE — Clinical Social Work Note (Signed)
Patient's information for SNF rehab request received by [facility] per worker Molly Maduro [phone]).  Will hold case open until at this time in case patient improves for SNF placement.  Santa Genera, LCSW Clinical Social Worker [phone])

## 2013-08-13 NOTE — Progress Notes (Signed)
SLP Cancellation Note  Patient Details Name: [name] [name] MRN: [id number] DOB: June 30, 1936   Cancelled treatment:       Reason Eval/Treat Not Completed: Fatigue/lethargy limiting ability to participate;Medical issues which prohibited therapy;Patient's level of consciousness;Patient not medically ready. Unfortunately, [name] medical status has deteriorated since MBSS on Tuesday. He is using non-rebreather for O2 and resting comfortably with family at bedside. Family was hoping Pt would eat something earlier, however I explained that unless he is asking for something, it is probably best to just keep his mouth moist with toothette and ice chip for comfort. Family in agreement, although tearful understandably.   Thank you,  [name] [name], CCC-SLP [phone]    [name] [name] 08/13/2013, 9:51 AM

## 2013-08-14 LAB — GLUCOSE, CAPILLARY: Glucose-Capillary: 80 mg/dL (ref 70–99)

## 2013-08-28 NOTE — Discharge Summary (Signed)
Physician Discharge Summary  Patient ID: [name] [name] MRN: [id number] DOB/AGE: 1936-06-02 77 y.o. Primary Care Physician:Yostin Malacara L, MD Admit date: 2013/08/11 Discharge date: 08/28/2013    Discharge Diagnoses:   Active Problems:   Atrial flutter   COPD (chronic obstructive pulmonary disease)   Hypertension   CKD (chronic kidney disease) stage 3, GFR 30-59 ml/min   Type 2 diabetes mellitus, uncontrolled   Chronic diastolic congestive heart failure   Acute-on-chronic respiratory failure   HCAP (healthcare-associated pneumonia)   Anemia, normocytic normochromic   Pleural effusion   PUD (peptic ulcer disease)     Medication List    ASK your doctor about these medications       acetaminophen 500 MG tablet  Commonly known as:  TYLENOL  Take 1,000 mg by mouth every 6 (six) hours as needed. Pain     aspirin 81 MG chewable tablet  Chew 81 mg by mouth daily.     diltiazem 240 MG 24 hr capsule  Commonly known as:  CARDIZEM CD  Take 1 capsule (240 mg total) by mouth 2 (two) times daily.     fluticasone 110 MCG/ACT inhaler  Commonly known as:  FLOVENT HFA  Inhale 1 puff into the lungs 2 (two) times daily.     furosemide 40 MG tablet  Commonly known as:  LASIX  Take 2 tablets (80 mg total) by mouth 2 (two) times daily.     glyBURIDE 5 MG tablet  Commonly known as:  DIABETA  Take 5 mg by mouth daily with breakfast.     insulin aspart 100 UNIT/ML injection  Commonly known as:  novoLOG  - Inject 3-15 Units into the skin 3 (three) times daily with meals. Sliding scale as follows:  - 121-150=3 units  - 151-200=4 units  - 201-250=7 units  - 251-300=9 units  - 301-350=12 units  - 351-400=15 units     insulin glargine 100 UNIT/ML injection  Commonly known as:  LANTUS  Inject 5 Units into the skin at bedtime.     ipratropium-albuterol 0.5-2.5 (3) MG/3ML Soln  Commonly known as:  DUONEB  Take 3 mLs by nebulization 4 (four) times daily.     magnesium  hydroxide 400 MG/5ML suspension  Commonly known as:  MILK OF MAGNESIA  Take 30 mLs by mouth daily as needed for constipation.     pantoprazole 40 MG tablet  Commonly known as:  PROTONIX  Take 1 tablet (40 mg total) by mouth 2 (two) times daily before a meal.     predniSONE 10 MG tablet  Commonly known as:  DELTASONE  Take 5 mg by mouth daily with breakfast.     sodium chloride 0.65 % Soln nasal spray  Commonly known as:  OCEAN  Place 1 spray into the nose as needed for congestion.        Discharged Condition: Deceased    Consults: Interventional radiology  Significant Diagnostic Studies: Ir US Guide Bx Asp/drain  08/04/2013   *RADIOLOGY REPORT*  Clinical Data/Indication: RECURRENT LEFT PLEURAL EFFUSION  RADIOLOGY EXAMINATION,IR ULTRASOUND GUIDANCE TISSUE ABLATION  Sedation: Versed 1.0 mg, Fentanyl 25 mg.  Total Moderate Sedation Time: 15 minutes.  Vancomycin was given within two hours of incision.  Vancomycin was given due to an antibiotic allergy.  Fluoroscopy Time: 24 seconds.  Procedure: The procedure, risks, benefits, and alternatives were explained to the patient. Questions regarding the procedure were encouraged and answered. The patient understands and consents to the procedure.  The left lower thorax was prepped  with betadine in a sterile fashion, and a sterile drape was applied covering the operative field. A sterile gown and sterile gloves were used for the procedure.  Under sonographic guidance, an 18 gauge needle was inserted into the left pleural space containing fluid at the midaxillary line and lower rib cage.  It was removed over an Amplatz.  A peel-away sheath was inserted.  A second incision was made anteriorly 10 cm. The leading edge of the catheter was then advanced from the second incision help the puncture site incision then fed through the peel- away sheath.  The peel-away sheath was removed.  The hub of the catheter was constructed after removing the stylet.  Fluid  was removed.  The initial site was closed with a 3-0 Monocryl stitch for the subcutaneous tissues and a 4-0 Vicryl subcuticular stitch. The second site was closed with an O Prolene pursestring knot.  Findings: The image demonstrates placement of a left Pleurx drain.  Complications: None.  IMPRESSION: Successful left Pleurx drain.   Original Report Authenticated By: Jolaine Click, M.D.   Dg Chest Port 1 View  08/11/2013   *RADIOLOGY REPORT*  Clinical Data: Pleural effusion, congestion  PORTABLE CHEST - 1 VIEW  Comparison: 08/06/2013  Findings: Multifocal interstitial/airspace opacities bilaterally, including more focal opacity in the lateral right upper lobe, suspicious for multifocal pneumonia.  Underlying interstitial edema is possible.  Small left pleural effusion, unchanged.  No pneumothorax.  Stable left chest tube/drain.  The heart is top normal in size.  Median sternotomy.  IMPRESSION: Multifocal interstitial/airspace opacities, including more focal right upper lobe opacity, suspicious for multifocal pneumonia.  Underlying interstitial edema is possible.  Small left pleural effusion with indwelling left chest tube/drain.   Original Report Authenticated By: Charline Bills, M.D.   Dg Chest Port 1 View  08/06/2013   *RADIOLOGY REPORT*  Clinical Data: History of pneumonia. History of placement of left Pleurx drain.  PORTABLE CHEST - 1 VIEW  Comparison: 08/03/2013.  Findings: There is stable cardiac silhouette enlargement.  Previous median sternotomy has been performed.  Mediastinal and hilar contours appear stable.  Diffuse airspace infiltrative densities are again evident without definite significant change.  Atelectasis and opacity in the inferior left hemithorax is seen. The left basilar drainage tube is present.  IMPRESSION: Cardiac silhouette enlargement. Diffuse airspace infiltrative densities are again evident without definite significant change. Atelectasis and opacity in the inferior left hemithorax  is seen. The left basilar drainage tube is present.  No pneumothorax is evident.   Original Report Authenticated By: Onalee Hua Call   Dg Chest Port 1 View  08/03/2013   *RADIOLOGY REPORT*  Clinical Data: Respiratory failure  PORTABLE CHEST - 1 VIEW  Comparison: 08/01/2013  Findings: Severe bilateral airspace disease is unchanged.  Left pleural effusion and left lower compressive atelectasis also unchanged.  IMPRESSION: No significant change severe diffuse bilateral airspace disease and left effusion.   Original Report Authenticated By: Janeece Riggers, M.D.   Dg Chest Port 1 View  08/01/2013   *RADIOLOGY REPORT*  Clinical Data: Pneumonia.  PORTABLE CHEST - 1 VIEW  Comparison: 07/15/2013.  Findings: No change in patchy bilateral consolidation.  There is a small to moderate left-sided pleural effusion.  Unchanged cardiopericardial enlargement.  Previous median sternotomy for aortic valve replacement.  No evident pneumothorax.  IMPRESSION: 1. Stable exam compared to yesterday. Bilateral opacities is likely pneumonia.  2.  Left pleural effusion and possible background of edema.   Original Report Authenticated By: Tiburcio Pea   Dg  Chest Portable 1 View  07/13/2013   *RADIOLOGY REPORT*  Clinical Data: Shortness of breath.  PORTABLE CHEST - 1 VIEW  Comparison: July 25, 2013.  Findings: Stable cardiomediastinal silhouette is noted.  Sternotomy wires are noted.  Mild left pleural effusion is noted which is not significantly changed compared to prior exam.  Increased alveolar opacities seen laterally in right upper lobe concerning for pneumonia.  Diffuse interstitial and alveolar opacities are again noted throughout both lungs which are unchanged.  IMPRESSION: Continued presence of bilateral lung opacities which are not significantly changed compared to prior exam.  Focal alveolar opacity is noted in right upper lobe which is increased compared to prior exam and concerning for possible pneumonia.  Stable mild left pleural  effusion.   Original Report Authenticated By: Lupita Raider.,  M.D.   Dg Swallowing Func-speech Pathology  08/11/2013   Dorene Ar, CCC-SLP     08/11/2013  2:10 PM Objective Swallowing Evaluation: Modified Barium Swallowing Study    Patient Details  Name: [name] [name] MRN: [id number] Date of Birth: 02/26/1936  Today's Date: 08/11/2013 Time: 1200-1240 SLP Time Calculation (min): 40 min  Past Medical History:  Past Medical History  Diagnosis Date  . COPD (chronic obstructive pulmonary disease)   . AF (atrial fibrillation)   . Hypertension   . Gout   . Arthritis   . Small bowel obstruction   . Renal insufficiency   . Aortic stenosis   . Hydropneumothorax   . Diabetes mellitus   . CHF (congestive heart failure)   . On home O2     qhs prn  . Pneumonia 05/04/2013    history of pneumonia  . Pneumonia 10/12  . Hydropneumothorax 2012   Past Surgical History:  Past Surgical History  Procedure Laterality Date  . Rotator cuff repair      left and right  . Cardiac valve replacement      aortic valve with a tissue prosthesis and left sided maze proc  . S/p rewiring of sternum for dehiscence    . Gallbladder surgery    . US echocardiography  01-03-11    EF 55-60%  . Cardiovascular stress test  01-26-09    EF 67%  . Coronary angioplasty    . Cholecystectomy    . Sternal incision reclosure  08/29/2010    sternal rewiring  . Cataract extraction w/phaco  10/02/2012    Procedure: CATARACT EXTRACTION PHACO AND INTRAOCULAR LENS  PLACEMENT (IOC);  Surgeon: Gemma Payor, MD;  Location: [facility];   Service: Ophthalmology;  Laterality: Right;  CDE 16.68  . Cataract extraction w/phaco  10/27/2012    Procedure: CATARACT EXTRACTION PHACO AND INTRAOCULAR LENS  PLACEMENT (IOC);  Surgeon: Gemma Payor, MD;  Location: [facility];   Service: Ophthalmology;  Laterality: Left;  CDE:16.89  . Artificial aortic valve    . Colonoscopy N/A 06/19/2013    Dr. Jennell Corner internal hemorrhoids, diverticulosis  . Esophagogastroduodenoscopy N/A 06/19/2013    Dr.  Yong Channel stricture passed by scope, multiple  small gastric ulcers with evidence of old blood in the stomach,  gastritis. Biopsy was negative for H. pylori. There was Candida  species and possible colonization of the ulcer.   HPI:  [name] [name] is a 77 y.o. male with multiple medical problems  including chronic respiratory failure on home oxygen, COPD,  congestive heart failure, atrial flutter not on Coumadin due to  history of bleeding. Patient was recently in the hospital when he  was treated for  shortness of breath and underwent thoracentesis  with removal of pleural fluid. His daughter reports that he has  been in and out of the hospital with PNA.He had EGD done in June  2014 which showed esophageal stricture that was passed by scope.     Assessment / Plan / Recommendation Clinical Impression  Dysphagia Diagnosis: Within Functional Limits;Mild pharyngeal  phase dysphagia Clinical impression: Mild pharyngeal phase dysphagia in setting  of COPD/acute on chronic respiratory failure characterized by  delay in swallow initiation and mildly decreased eptiglottic  deflection resulting in variable flash penetration of thins and  nectars during the swallow, one episode of trace aspiration of  nectars after the swallow (delayed from residuals), and mild  lateral channels and pyriform residue. PO administration was  difficult due to pt needing oxygen via mask during po (became  more SOB when mask removed briefly for bites/sips). Pt had  increased residuals from base of tongue into valleculae with  nectars to which he did not appear sensate. He had a single  episode of trace aspiration of nectar residuals and again did not  feel. Pt was cued to cough and he cleared the aspirate. I do not  feel that his swallow function strongly correlates with his  overall clinical picture of suspected chronic aspiration, however  it is possible. Certainly, his decreased respiratory support and  history of COPD increases risk  of aspiration. Daughter states  that pt sits upright at the table  at home, seems to do ok with  liquids, but has difficulty with meats and breads. Esophageal  clearance appeared adequate today with the exception of the  barium tablet being briefly delayed near GE junction (it passed  with continued sips nectar). Recommend: Dysphagia 3 (for energy  conservation) and thin liquids via small sips with throat clear  and repeat swallow. Pt instructed to swallow 2x with each  bite/sip. Recommendations will be posted at [facility] and reviewed   again with pt/family.    Treatment Recommendation  Therapy as outlined in treatment plan below    Diet Recommendation Dysphagia 3 (Mechanical Soft);Thin liquid   Liquid Administration via: Cup Medication Administration: Whole meds with liquid (or break large  pills in puree) Supervision: Patient able to self feed;Full supervision/cueing  for compensatory strategies Compensations: Slow rate;Small sips/bites;Multiple dry swallows  after each bite/sip;Clear throat intermittently Postural Changes and/or Swallow Maneuvers: Seated upright 90  degrees;Upright 30-60 min after meal    Other  Recommendations Oral Care Recommendations: Oral care BID Other Recommendations: Clarify dietary restrictions   Follow Up Recommendations  Skilled Nursing facility    Frequency and Duration min 2x/week  1 week       SLP Swallow Goals Patient will consume recommended diet without observed clinical  signs of aspiration with: Minimal assistance Patient will utilize recommended strategies during swallow to  increase swallowing safety with: Minimal assistance   General Date of Onset: 07/01/2013 HPI: [name] [name] is a 77 y.o. male with multiple medical  problems including chronic respiratory failure on home oxygen,  COPD, congestive heart failure, atrial flutter not on Coumadin  due to history of bleeding. Patient was recently in the hospital  when he was treated for shortness of breath and underwent  thoracentesis  with removal of pleural fluid. His daughter reports  that he has been in and out of the hospital with PNA.He had EGD  done in June 2014 which showed esophageal stricture that was  passed by scope. Type of Study: Modified Barium  Swallowing Study Reason for Referral: Objectively evaluate swallowing function Previous Swallow Assessment: None on record Diet Prior to this Study: Regular;Thin liquids Temperature Spikes Noted: No Respiratory Status: Supplemental O2 delivered via (comment) (15L  face mask) History of Recent Intubation: No Behavior/Cognition: Alert;Cooperative;Pleasant mood Oral Cavity - Dentition: Dentures, top;Dentures, bottom Oral Motor / Sensory Function: Within functional limits Self-Feeding Abilities: Able to feed self Patient Positioning: Upright in chair Baseline Vocal Quality: Wet;Low vocal intensity Volitional Cough: Strong;Congested Volitional Swallow: Able to elicit Anatomy: Within functional limits Pharyngeal Secretions: Not observed secondary MBS    Reason for Referral Objectively evaluate swallowing function   Oral Phase Oral Preparation/Oral Phase Oral Phase: WFL   Pharyngeal Phase Pharyngeal Phase Pharyngeal Phase: Impaired Pharyngeal - Nectar Pharyngeal - Nectar Straw: Delayed swallow initiation;Premature  spillage to valleculae;Premature spillage to pyriform  sinuses;Reduced tongue base retraction;Penetration/Aspiration  during swallow;Penetration/Aspiration after swallow;Trace  aspiration;Pharyngeal residue - valleculae;Pharyngeal residue -  pyriform sinuses;Lateral channel residue Penetration/Aspiration details (nectar straw): Material does not  enter airway;Material enters airway, remains ABOVE vocal cords  then ejected out;Material enters airway, passes BELOW cords  without attempt by patient to eject out (silent  aspiration);Material enters airway, passes BELOW cords then  ejected out (trace silent aspir of nectars after swall, c'd cough  clears) Pharyngeal - Thin Pharyngeal - Thin  Cup: Delayed swallow initiation;Premature  spillage to valleculae;Premature spillage to pyriform  sinuses;Penetration/Aspiration during swallow;Pharyngeal residue  - pyriform sinuses Penetration/Aspiration details (thin cup): Material does not  enter airway;Material enters airway, remains ABOVE vocal cords  then ejected out Pharyngeal - Thin Straw: Delayed swallow initiation;Premature  spillage to valleculae;Premature spillage to pyriform  sinuses;Penetration/Aspiration during swallow;Pharyngeal residue  - pyriform sinuses Penetration/Aspiration details (thin straw): Material does not  enter airway;Material enters airway, remains ABOVE vocal cords  then ejected out Pharyngeal - Solids Pharyngeal - Puree: Within functional limits Pharyngeal - Regular: Within functional limits Pharyngeal - Pill: Within functional limits  Cervical Esophageal Phase    GO    Cervical Esophageal Phase Cervical Esophageal Phase: [facility]        Thank you,  Havery Moros, CCC-SLP [phone]  PORTER,DABNEY 08/11/2013, 2:09 PM    Ir Perc Pleural Drain Eliezer Bottom Cath W/img Guide  08/04/2013   *RADIOLOGY REPORT*  Clinical Data/Indication: RECURRENT LEFT PLEURAL EFFUSION  RADIOLOGY EXAMINATION,IR ULTRASOUND GUIDANCE TISSUE ABLATION  Sedation: Versed 1.0 mg, Fentanyl 25 mg.  Total Moderate Sedation Time: 15 minutes.  Vancomycin was given within two hours of incision.  Vancomycin was given due to an antibiotic allergy.  Fluoroscopy Time: 24 seconds.  Procedure: The procedure, risks, benefits, and alternatives were explained to the patient. Questions regarding the procedure were encouraged and answered. The patient understands and consents to the procedure.  The left lower thorax was prepped with betadine in a sterile fashion, and a sterile drape was applied covering the operative field. A sterile gown and sterile gloves were used for the procedure.  Under sonographic guidance, an 18 gauge needle was inserted into the left pleural space containing  fluid at the midaxillary line and lower rib cage.  It was removed over an Amplatz.  A peel-away sheath was inserted.  A second incision was made anteriorly 10 cm. The leading edge of the catheter was then advanced from the second incision help the puncture site incision then fed through the peel- away sheath.  The peel-away sheath was removed.  The hub of the catheter was constructed after removing the stylet.  Fluid was removed.  The initial site was closed  with a 3-0 Monocryl stitch for the subcutaneous tissues and a 4-0 Vicryl subcuticular stitch. The second site was closed with an O Prolene pursestring knot.  Findings: The image demonstrates placement of a left Pleurx drain.  Complications: None.  IMPRESSION: Successful left Pleurx drain.   Original Report Authenticated By: Jolaine Click, M.D.    Lab Results: Basic Metabolic Panel: No results found for this basename: NA, K, CL, CO2, GLUCOSE, BUN, CREATININE, CALCIUM, MG, PHOS,  in the last 72 hours Liver Function Tests: No results found for this basename: AST, ALT, ALKPHOS, BILITOT, PROT, ALBUMIN,  in the last 72 hours   CBC: No results found for this basename: WBC, NEUTROABS, HGB, HCT, MCV, PLT,  in the last 72 hours  No results found for this or any previous visit (from the past 240 hour(s)).   Hospital Course: He was admitted with recurrent problems with healthcare associated pneumonia and pleural effusion. He also had acute on chronic diastolic congestive heart failure. He was started on IV antibiotics diuretics etc. and improved initially. He was noted to have a recurrent pleural effusion and had a pleura cath placed by interventional radiology. He  then developed worsening problems with shortness of breath despite diuretics antibiotics steroids inhaled bronchodilators.  multiple discussions with family revealed that they wanted him made comfortable. He was started on morphine drip and died with his family at bedside  Discharge Exam: Blood  pressure 84/43, pulse 85, temperature 98.2 F (36.8 C), temperature source Axillary, resp. rate 12, height 5\' 6"  (1.676 m), weight 75.433 kg (166 lb 4.8 oz), SpO2 68.00%. Not applicable  Disposition: To funeral home      Signed: Tommy Rainwater [phone]  08/28/2013, 8:42 AM

## 2013-08-31 NOTE — H&P (Signed)
Patient expired at 0607 and verified by myself and Rexford Maus RN with family at bedside.

## 2013-08-31 NOTE — Progress Notes (Signed)
Dr. Delbert Harness was notified of patients expiration and death check list completed by myself Bennett Scrape RN

## 2013-08-31 DEATH — deceased

## 2014-02-26 ENCOUNTER — Telehealth: Payer: Self-pay | Admitting: Pulmonary Disease

## 2014-02-26 NOTE — Telephone Encounter (Signed)
Called and spoke with medical records and they advised that no forms have been received.  Asked to give the pts wife this fax number and re fax these forms to [phone].  i called and spoke with sheri and she is aware that these forms were not received and she will refax them on Monday.    The pts wife did call back with sheri on the line and did give her permission for us to speak with sheri regarding these insurance forms.  Will sign off of this message.

## 2014-02-26 NOTE — Telephone Encounter (Signed)
Spoke w/ Lavonna RuaSheri. Advised her we do not have permission to speak with her regarding pt and will need spouse to call us. She will do so. Will await call back

## 2014-03-25 ENCOUNTER — Telehealth: Payer: Self-pay | Admitting: Pulmonary Disease

## 2014-03-25 NOTE — Telephone Encounter (Signed)
ATC Sherri x2 - line busy.  WCB.  I do have the papers.  Pt was never seen in the office.  Dr Sung AmabileSimonds is a hospital based provider and therefore is never in the office to sign paperwork.  I spoke with TD and she stated that our manager will be at the hospital tomorrow and may be able to give this paperwork to DS to fill out and sign.

## 2014-03-25 NOTE — Telephone Encounter (Signed)
Spoke with Roanna RaiderSherri and she states that medical records and [employer] have told her that paperwork has been sent up for Dr Sung AmabileSimonds to address.  Spoke with Shanda BumpsJessica and she has papers.  Will forward message to her.

## 2014-03-25 NOTE — Telephone Encounter (Signed)
[name] [name] calling back a/b paperwork, informed her that dr simmond bascially works @ hosp and that our mang was going to try and get paperwork to him, [name] [name] states that the fax # given will be truned off tomorrow but if someone calls her she will go to office and turn it own b/c she doesn't live fare from the office [phone].[name] GriffinsStanley A [name]

## 2014-03-26 NOTE — Telephone Encounter (Signed)
Will need to speak with [name] [name] to see if forms were giving to Dr. Sung AmabileSimonds. Lavonna RuaSheri is aware we will call her back with update once we know something

## 2014-03-26 NOTE — Telephone Encounter (Signed)
Sheri calling asking about the status of paperwork.  Would like call back asap.  [phone]

## 2014-03-26 NOTE — Telephone Encounter (Signed)
Called spoke with Roanna RaiderSherri TD is holding paperwork to give to DS on 3.30.15 Roanna RaiderSherri is okay with this - would like to know about reimbursement for this process taking longer than expected, she stated she gave the paperwork to [employer] on 3.2.15.  The date on the paperwork when sent up to Pulmonary is 3.24.15. Sherri is aware I will check on this and let her know next week.  Will route to my box

## 2014-03-26 NOTE — Telephone Encounter (Signed)
Sherri has called back.  She states they are trying to finish out claim today.

## 2014-03-30 NOTE — Telephone Encounter (Signed)
TD was able to speak with Dr Sung AmabileSimonds this morning: If a Critical Care physician needs to sign the form, then Dr Delford FieldWright can sign as he did the discharge.  Otherwise, form should go to pt's PCP Dr Wynetta EmeryWilliam Hawkins as care was referred back to him in the hospital.  [facility] Eber Jonesarolyn who sent the form to DS.  Discussed the above with her and form sent back down to her per her request. ~~~~~~~~~~~~~~~~ [facility] and informed [facility] of the above.  Sherri okay with this but asked again about reimbursement.  Ap[facility]ized to [facility] for not obtaining this info and stated will let her know - she stated it's okay to let her know about this when calling back w/ update on the paperwork.  Called [facility] again and spoke with [facility].  She stated that pt can call [phone] to inquire about reimbursement.  Will forward message to Crystal to make her aware of pending paperwork to PW.

## 2014-03-30 NOTE — Telephone Encounter (Signed)
Spoke with JJ and will route to crystal and she will speak with her.

## 2014-03-30 NOTE — Telephone Encounter (Signed)
Sorry I have no idea what any of this is about

## 2014-04-01 NOTE — Telephone Encounter (Signed)
Per Rene Kocheregina in the HIM department ---  This is to be handled by Dr. Juanetta GoslingHawkins.  Any further request or information needs to be directed to [facility].  Signing off message per Philipp Deputyammy Davis.

## 2014-04-01 NOTE — Telephone Encounter (Signed)
I spoke with JJ yesterday about this. Will need to check with [facility] to see if form needs to be completed by CCM or PCP.  Called [facility] this morning, spoke with Eber Jonesarolyn.  Was advised she has form out and will discuss with her supervisor, Rene Kocheregina.  After this has been done, she will call back with an update.

## 2014-04-08 ENCOUNTER — Telehealth: Payer: Self-pay | Admitting: Pulmonary Disease

## 2014-04-08 NOTE — Telephone Encounter (Signed)
Was contacted by Dr. Juanetta GoslingHawkins office Kriste Basque(Becky), the attending physician statement form for dates for dates of service May 5 through May 27th will not be completed by Dr. Juanetta GoslingHawkins. I contacted Patsy LagerVic Vickers from the Office of Patients Experience who directed me to contact Boneta LucksEdward Hickley [phone]) from [name]. Mr Janeece FittingHickley is out of office until Monday 04/12/14. Vic explained Hospitalists may fill the forms when attending physicians are not available. I will send an e-mail to Mr. Janeece FittingHickley along with the physicians statement for his review. I am hampered in relaying information to Donato SchultzSherry Graham, who is identified as the neighbor.

## 2014-04-08 NOTE — Telephone Encounter (Signed)
Called number left by Miss Cheree DittoGraham on voice mail [phone]; this number is a work number answering as the office of Dr. Kaleen OdeaBolton, message not left. I contacted the secondary number left [phone] and the phone rang over 10 times without any type of voicemail picking up. I am unable to communicate with Miss Donato SchultzSherry Graham. rmf 3:53pm

## 2015-01-11 IMAGING — CR DG CHEST 1V PORT
1 series · 1 of 1 positions shown · non-contrast
Comparison: Portable chest x-ray of 05/13/2013

CLINICAL DATA: Respiratory failure, follow-up

PORTABLE CHEST - 1 VIEW

[AP]
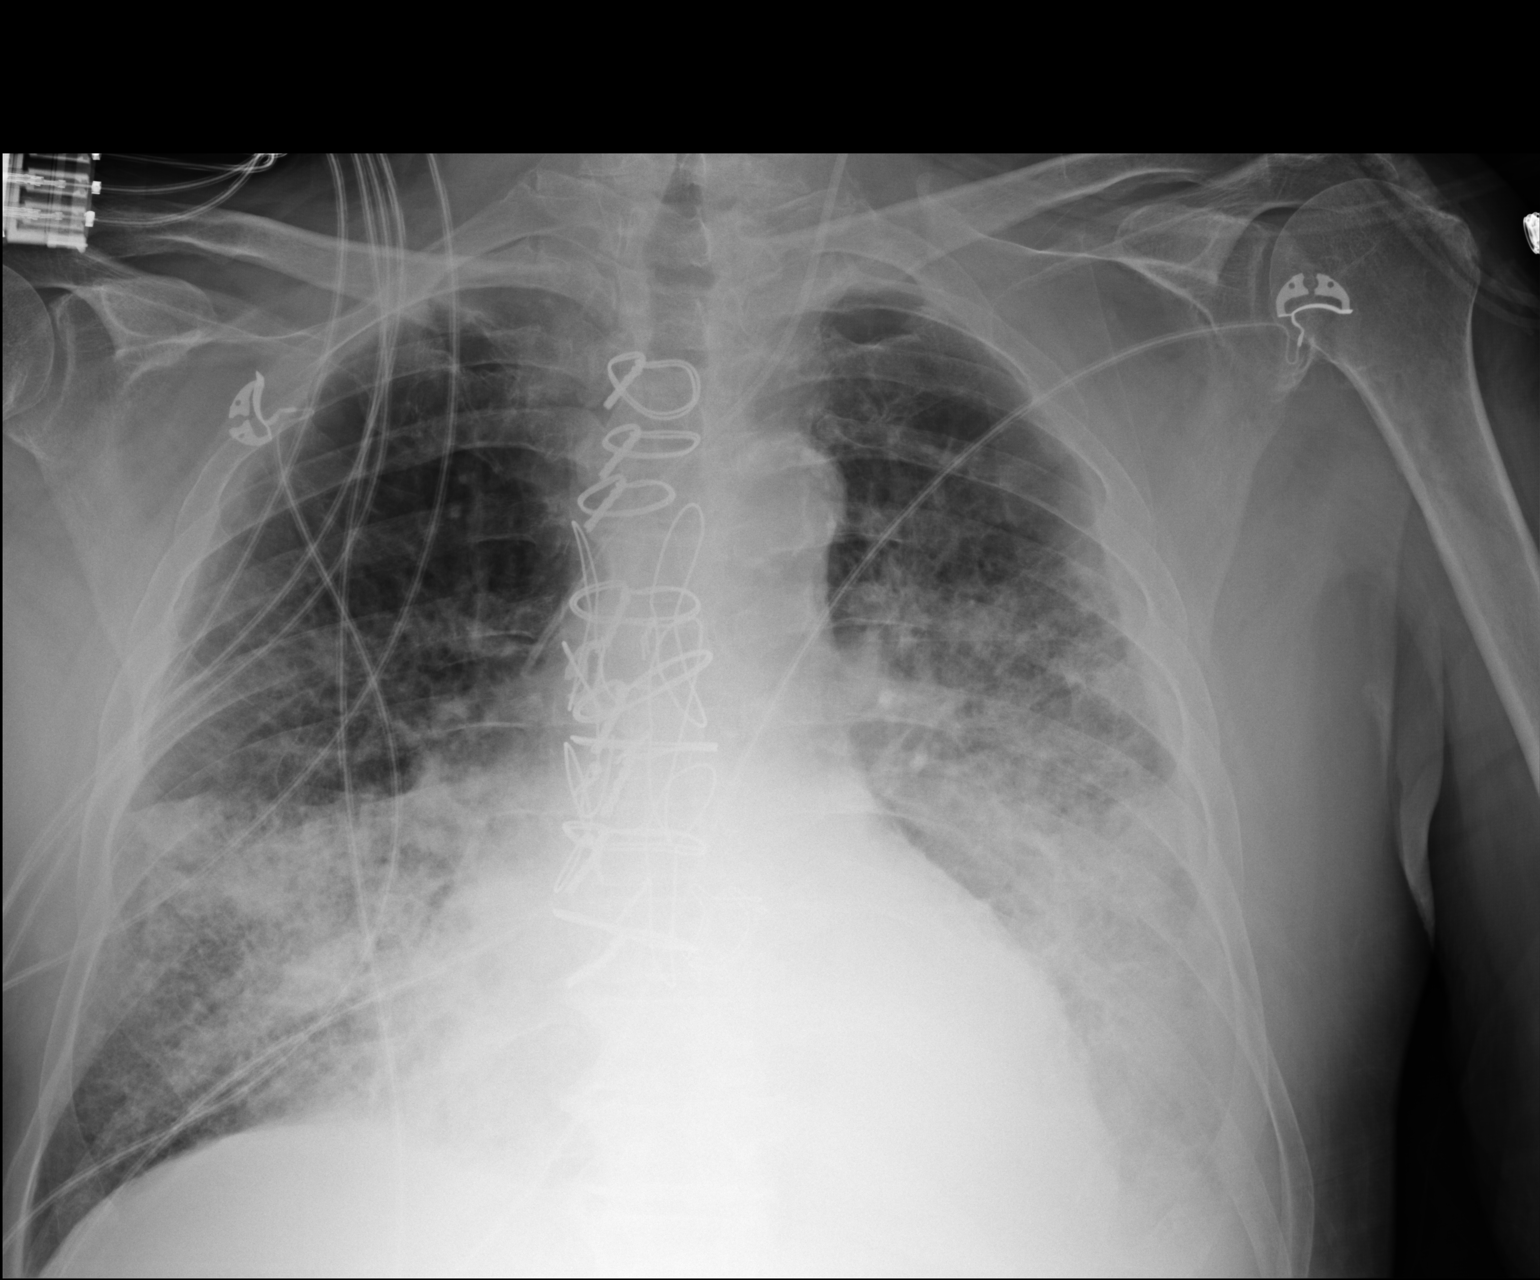

[1 of 1 positions shown; findings below may reference images not displayed]

FINDINGS: The endotracheal tube appears to have been removed.
There is little change in aeration.  Patchy airspace disease
remains left greater than right with probable left effusion.
Cardiomegaly is stable. The left central venous line remains.
IMPRESSION: Endotracheal tube removed.  No change in aeration.  Persistent
patchy opacities bilaterally.

## 2015-01-19 IMAGING — CR DG CHEST 2V
2 series · 2 of 2 positions shown · non-contrast
Comparison: CT chest 05/21/2013 and chest radiograph 05/20/2013.

CLINICAL DATA: Follow-up edema.  Question left effusion.

CHEST - 2 VIEW

[w chest lat]
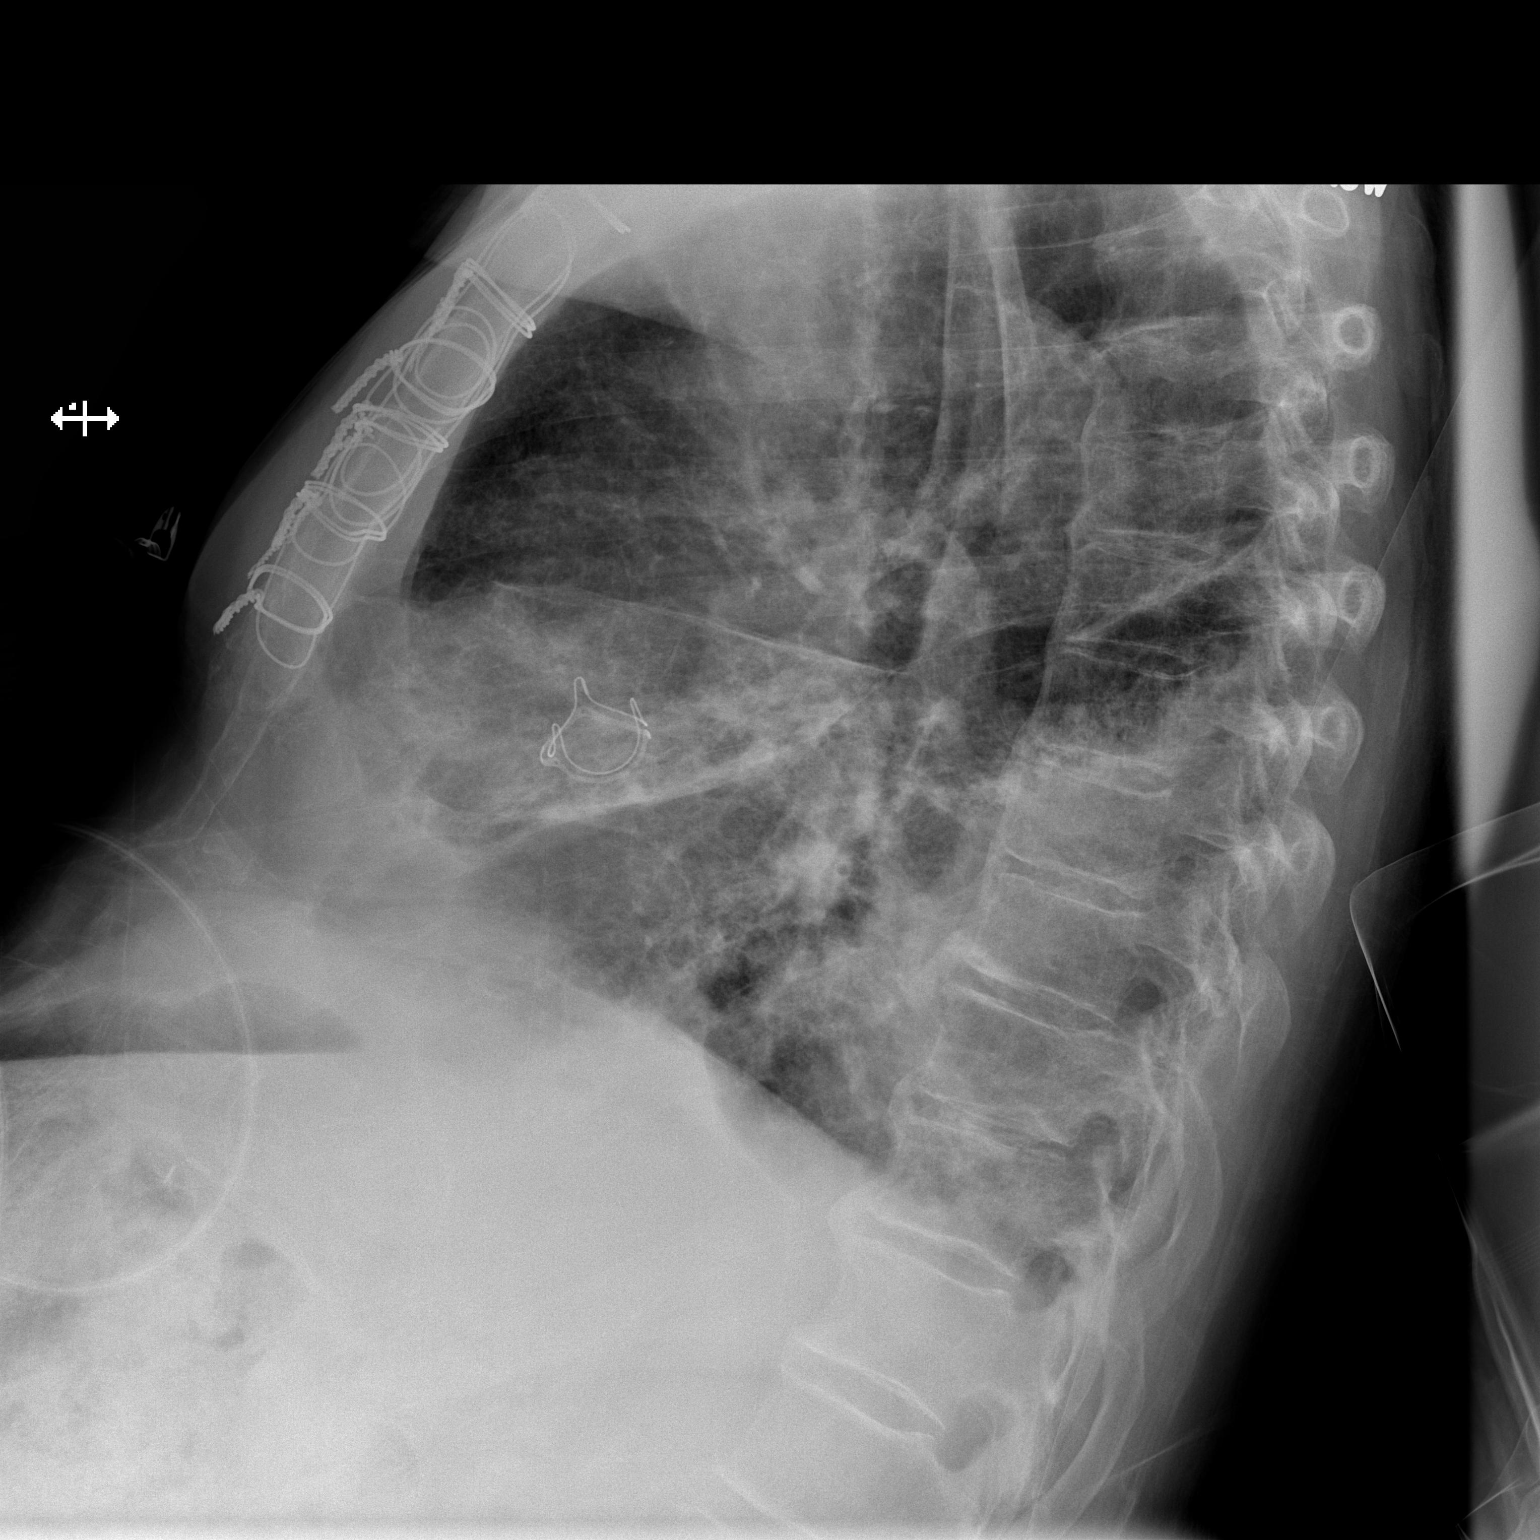

[x chest ap]
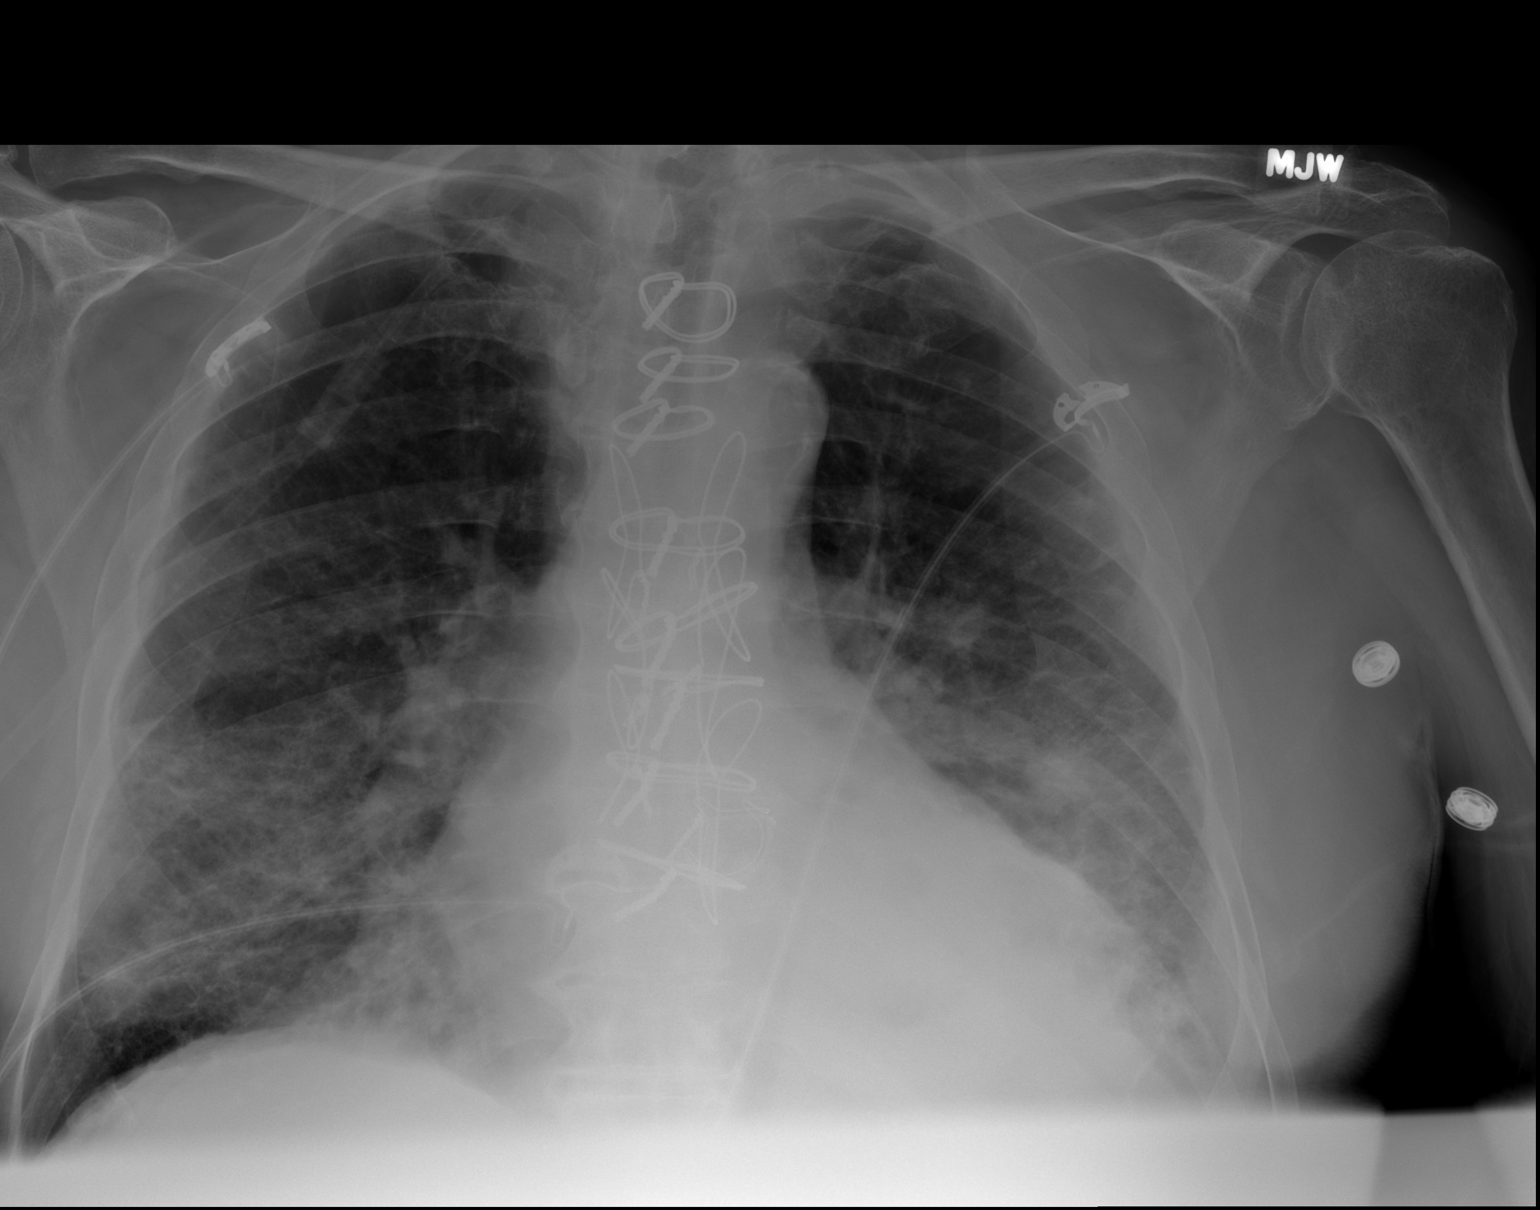

[2 of 2 positions shown; findings below may reference images not displayed]

FINDINGS: Trachea is midline.  Heart is at the upper limits of
normal in size.  There is bibasilar dependent air space disease
superimposed on emphysema.  Moderate left pleural effusion.  Tiny
right pleural effusion.  Flowing anterior osteophytosis is seen in
the thoracic spine.
IMPRESSION: 1.  Bibasilar air space disease, as before, indicative of
multilobar pneumonia.  Edema not excluded.
2.  Bilateral pleural effusions, left greater than right.

## 2015-01-19 IMAGING — CR DG CHEST 1V PORT
1 series · 1 of 1 positions shown · non-contrast
Comparison: Portable exam 1111 hours compared to 3192 hours

CLINICAL DATA: Post thoracentesis

PORTABLE CHEST - 1 VIEW

[AP]
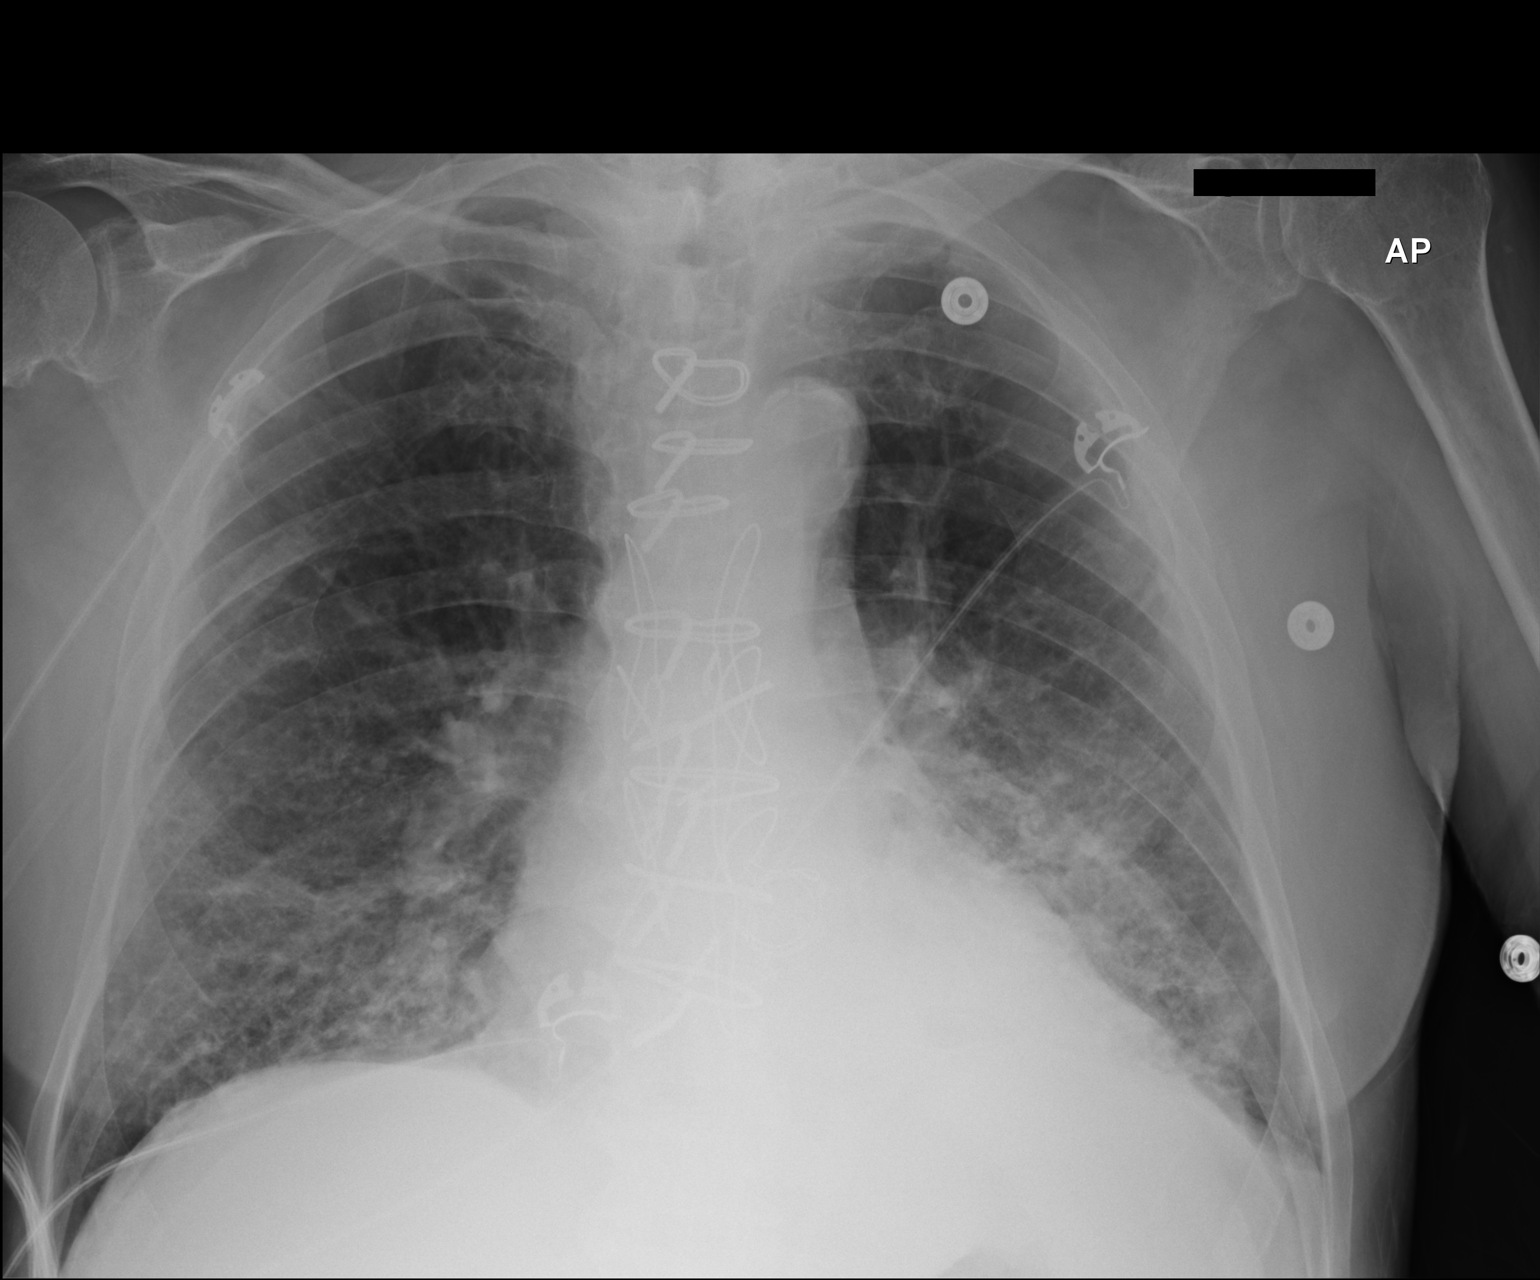

[1 of 1 positions shown; findings below may reference images not displayed]

FINDINGS: Upper normal heart size post median sternotomy and AVR.
Atherogenic calcification aorta.
Bibasilar airspace infiltrates most consistent with pneumonia,
greater on left. Small left pleural effusion, decreased.
Upper lungs grossly clear.
No definite pneumothorax.
Bones appear demineralized.
IMPRESSION: No pneumothorax.
Bibasilar airspace infiltrates most consistent with pneumonia,
greater on left, little changed.

## 2015-01-20 IMAGING — CR DG CHEST 1V PORT
1 series · 1 of 1 positions shown · non-contrast
Comparison: Portable chest x-ray yesterday and 05/20/2013 and two-
view chest x-ray yesterday.  CT chest 05/21/2013.

CLINICAL DATA: Follow up pneumonia.

PORTABLE CHEST - 1 VIEW [DATE]/[id number] 5088 hours:

[AP]
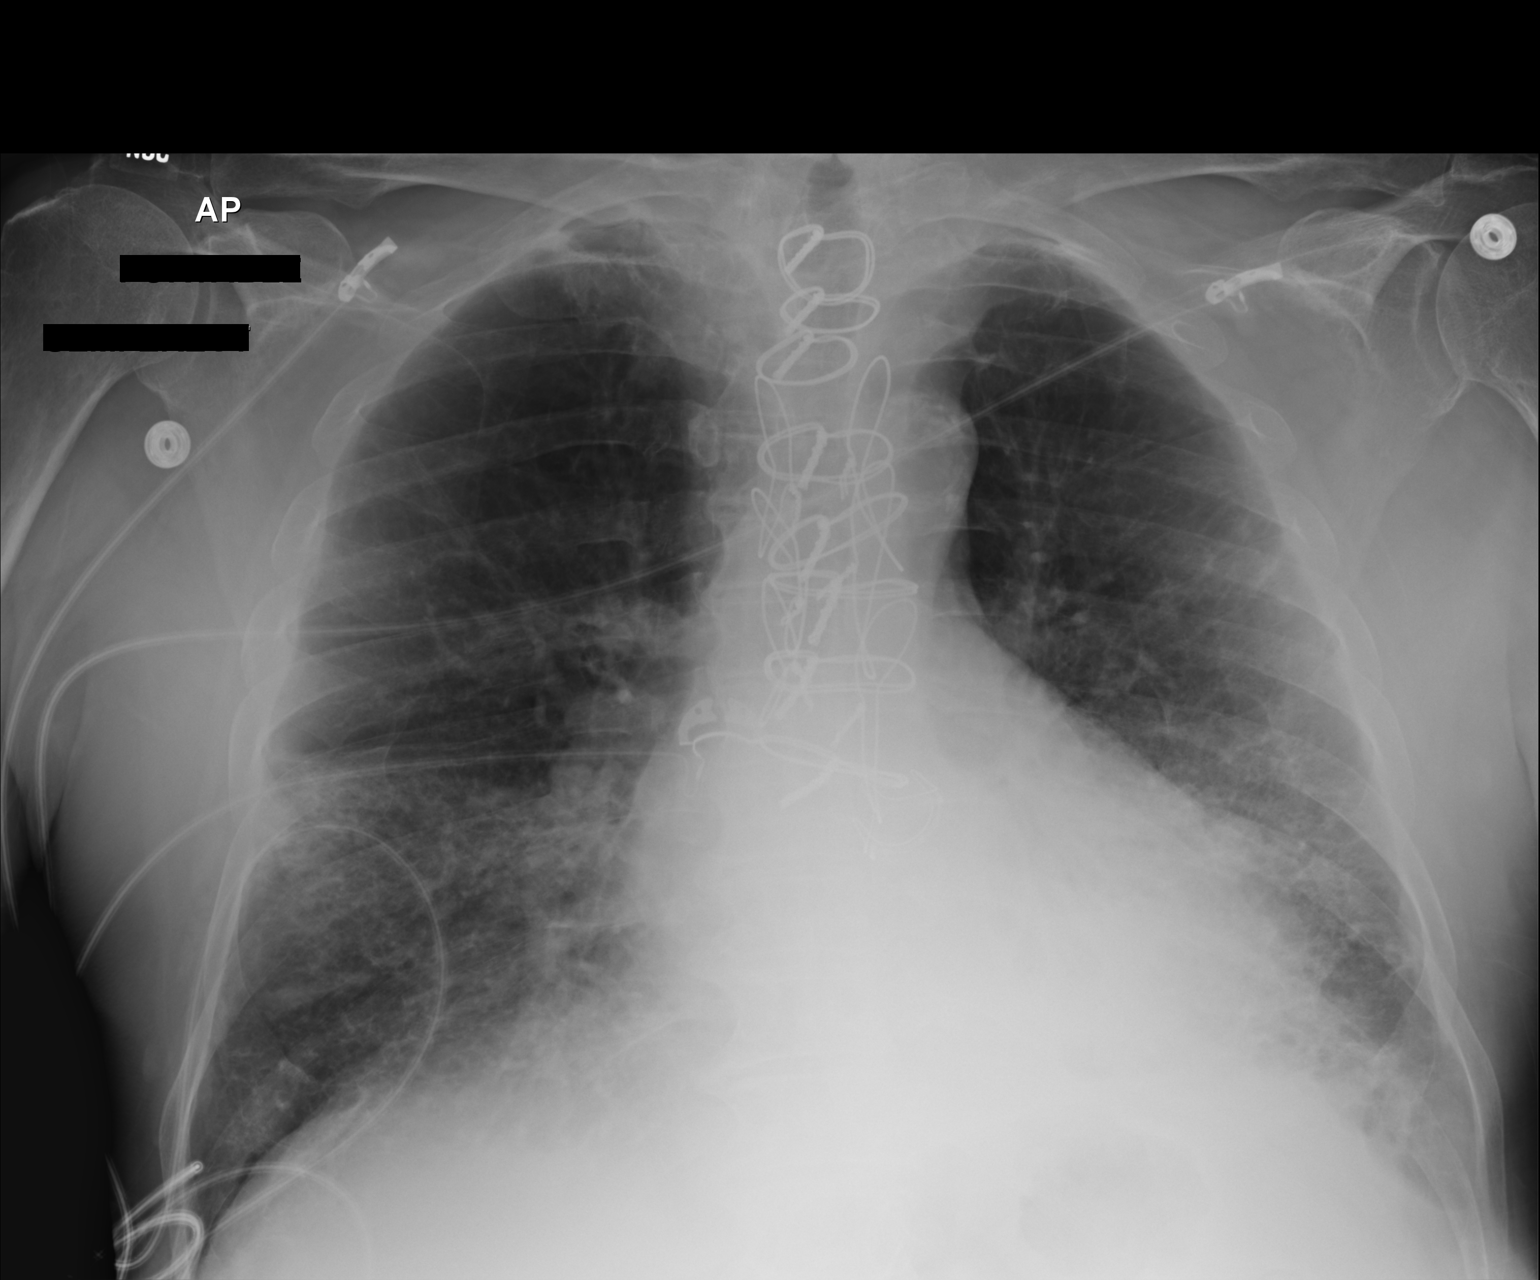

[1 of 1 positions shown; findings below may reference images not displayed]

FINDINGS: Prior sternotomy for CABG.  Cardiac silhouette enlarged
but stable.  Emphysematous changes in the upper lobes.  Improvement
in the patchy airspace opacities throughout both lungs, most
confluent in the left lower lobe, though moderate opacity persists.
Stable left pleural effusion.  No new pulmonary parenchymal
abnormalities.
IMPRESSION: Improving pneumonia in both lungs, though moderate patchy airspace
opacities persist.  Stable small left pleural effusion.  No new
abnormalities.
